# Patient Record
Sex: Female | Born: 1955 | Race: White | Hispanic: No | Marital: Married | Smoking: Never smoker
Health system: Southern US, Community
[De-identification: ages and names within clinical notes are randomized; demographics above are authoritative.]

## PROBLEM LIST (undated history)

## (undated) DIAGNOSIS — M8580 Other specified disorders of bone density and structure, unspecified site: Secondary | ICD-10-CM

## (undated) DIAGNOSIS — I7300 Raynaud's syndrome without gangrene: Secondary | ICD-10-CM

## (undated) DIAGNOSIS — B029 Zoster without complications: Secondary | ICD-10-CM

## (undated) DIAGNOSIS — Q8909 Congenital malformations of spleen: Secondary | ICD-10-CM

## (undated) DIAGNOSIS — R591 Generalized enlarged lymph nodes: Secondary | ICD-10-CM

## (undated) DIAGNOSIS — I639 Cerebral infarction, unspecified: Secondary | ICD-10-CM

## (undated) DIAGNOSIS — D649 Anemia, unspecified: Secondary | ICD-10-CM

## (undated) DIAGNOSIS — R569 Unspecified convulsions: Secondary | ICD-10-CM

## (undated) DIAGNOSIS — M3500 Sicca syndrome, unspecified: Secondary | ICD-10-CM

## (undated) DIAGNOSIS — L570 Actinic keratosis: Secondary | ICD-10-CM

## (undated) DIAGNOSIS — E785 Hyperlipidemia, unspecified: Secondary | ICD-10-CM

## (undated) DIAGNOSIS — T8859XA Other complications of anesthesia, initial encounter: Secondary | ICD-10-CM

## (undated) DIAGNOSIS — M419 Scoliosis, unspecified: Secondary | ICD-10-CM

## (undated) DIAGNOSIS — R599 Enlarged lymph nodes, unspecified: Secondary | ICD-10-CM

## (undated) DIAGNOSIS — R531 Weakness: Secondary | ICD-10-CM

## (undated) DIAGNOSIS — E039 Hypothyroidism, unspecified: Secondary | ICD-10-CM

## (undated) DIAGNOSIS — T7840XA Allergy, unspecified, initial encounter: Secondary | ICD-10-CM

## (undated) HISTORY — PX: FRACTURE SURGERY: SHX138

## (undated) HISTORY — PX: TONSILLECTOMY: SUR1361

## (undated) HISTORY — PX: EYE SURGERY: SHX253

## (undated) HISTORY — PX: FOOT FRACTURE SURGERY: SHX645

## (undated) HISTORY — DX: Cerebral infarction, unspecified: I63.9

## (undated) HISTORY — PX: BREAST CYST ASPIRATION: SHX578

## (undated) HISTORY — DX: Actinic keratosis: L57.0

## (undated) HISTORY — PX: FOOT SURGERY: SHX648

---

## 1988-12-16 HISTORY — PX: BREAST CYST ASPIRATION: SHX578

## 1990-04-17 HISTORY — PX: OTHER SURGICAL HISTORY: SHX169

## 2005-06-25 ENCOUNTER — Other Ambulatory Visit: Payer: Self-pay

## 2005-06-25 ENCOUNTER — Emergency Department: Payer: Self-pay | Admitting: General Practice

## 2005-06-26 ENCOUNTER — Ambulatory Visit: Payer: Self-pay | Admitting: General Practice

## 2005-06-30 ENCOUNTER — Ambulatory Visit: Payer: Self-pay | Admitting: Internal Medicine

## 2005-08-11 ENCOUNTER — Ambulatory Visit: Payer: Self-pay | Admitting: Specialist

## 2005-09-05 ENCOUNTER — Ambulatory Visit: Payer: Self-pay | Admitting: Obstetrics and Gynecology

## 2006-09-19 ENCOUNTER — Ambulatory Visit: Payer: Self-pay | Admitting: Obstetrics and Gynecology

## 2006-10-02 ENCOUNTER — Ambulatory Visit: Payer: Self-pay | Admitting: Gastroenterology

## 2007-09-25 ENCOUNTER — Ambulatory Visit: Payer: Self-pay | Admitting: Obstetrics and Gynecology

## 2008-10-01 ENCOUNTER — Ambulatory Visit: Payer: Self-pay | Admitting: Obstetrics and Gynecology

## 2008-10-12 ENCOUNTER — Ambulatory Visit: Payer: Self-pay | Admitting: Obstetrics and Gynecology

## 2009-11-01 ENCOUNTER — Ambulatory Visit: Payer: Self-pay | Admitting: Obstetrics and Gynecology

## 2011-01-10 ENCOUNTER — Ambulatory Visit: Payer: Self-pay | Admitting: Obstetrics and Gynecology

## 2012-01-17 ENCOUNTER — Ambulatory Visit: Payer: Self-pay | Admitting: Obstetrics and Gynecology

## 2013-01-20 ENCOUNTER — Ambulatory Visit: Payer: Self-pay | Admitting: Obstetrics and Gynecology

## 2013-07-10 DIAGNOSIS — M722 Plantar fascial fibromatosis: Secondary | ICD-10-CM | POA: Insufficient documentation

## 2013-07-10 DIAGNOSIS — D649 Anemia, unspecified: Secondary | ICD-10-CM | POA: Insufficient documentation

## 2013-07-10 DIAGNOSIS — D510 Vitamin B12 deficiency anemia due to intrinsic factor deficiency: Secondary | ICD-10-CM | POA: Insufficient documentation

## 2014-02-03 ENCOUNTER — Ambulatory Visit: Payer: Self-pay | Admitting: Obstetrics and Gynecology

## 2014-11-27 ENCOUNTER — Encounter: Payer: Self-pay | Admitting: *Deleted

## 2014-11-30 ENCOUNTER — Encounter: Admission: RE | Payer: Self-pay | Source: Ambulatory Visit

## 2014-11-30 ENCOUNTER — Ambulatory Visit
Admission: RE | Admit: 2014-11-30 | Payer: BC Managed Care – PPO | Source: Ambulatory Visit | Admitting: Gastroenterology

## 2014-11-30 HISTORY — DX: Raynaud's syndrome without gangrene: I73.00

## 2014-11-30 HISTORY — DX: Enlarged lymph nodes, unspecified: R59.9

## 2014-11-30 HISTORY — DX: Unspecified convulsions: R56.9

## 2014-11-30 HISTORY — DX: Scoliosis, unspecified: M41.9

## 2014-11-30 HISTORY — DX: Hypothyroidism, unspecified: E03.9

## 2014-11-30 HISTORY — DX: Generalized enlarged lymph nodes: R59.1

## 2014-11-30 HISTORY — DX: Zoster without complications: B02.9

## 2014-11-30 HISTORY — DX: Congenital malformations of spleen: Q89.09

## 2014-11-30 HISTORY — DX: Sjogren syndrome, unspecified: M35.00

## 2014-11-30 HISTORY — DX: Anemia, unspecified: D64.9

## 2014-11-30 HISTORY — DX: Other specified disorders of bone density and structure, unspecified site: M85.80

## 2014-11-30 SURGERY — ESOPHAGOGASTRODUODENOSCOPY (EGD) WITH PROPOFOL
Anesthesia: General

## 2015-01-26 ENCOUNTER — Other Ambulatory Visit: Payer: Self-pay | Admitting: Obstetrics and Gynecology

## 2015-01-26 DIAGNOSIS — Z1231 Encounter for screening mammogram for malignant neoplasm of breast: Secondary | ICD-10-CM

## 2015-02-08 ENCOUNTER — Ambulatory Visit: Payer: BC Managed Care – PPO

## 2015-02-10 ENCOUNTER — Ambulatory Visit: Payer: BC Managed Care – PPO

## 2015-02-11 ENCOUNTER — Ambulatory Visit
Admission: RE | Admit: 2015-02-11 | Discharge: 2015-02-11 | Disposition: A | Discharge status: Home / Self Care | Payer: BC Managed Care – PPO | Source: Ambulatory Visit | Attending: Obstetrics and Gynecology | Admitting: Obstetrics and Gynecology

## 2015-02-11 DIAGNOSIS — Z1231 Encounter for screening mammogram for malignant neoplasm of breast: Secondary | ICD-10-CM | POA: Diagnosis present

## 2015-05-29 ENCOUNTER — Inpatient Hospital Stay (HOSPITAL_COMMUNITY)
Admission: EM | Admit: 2015-05-29 | Discharge: 2015-06-16 | DRG: 870 | Disposition: A | Discharge status: Rehabilitation Facility | Payer: BC Managed Care – PPO | Attending: Internal Medicine | Admitting: Internal Medicine

## 2015-05-29 DIAGNOSIS — B954 Other streptococcus as the cause of diseases classified elsewhere: Secondary | ICD-10-CM | POA: Diagnosis present

## 2015-05-29 DIAGNOSIS — A400 Sepsis due to streptococcus, group A: Principal | ICD-10-CM | POA: Diagnosis present

## 2015-05-29 DIAGNOSIS — G8191 Hemiplegia, unspecified affecting right dominant side: Secondary | ICD-10-CM | POA: Diagnosis present

## 2015-05-29 DIAGNOSIS — J9690 Respiratory failure, unspecified, unspecified whether with hypoxia or hypercapnia: Secondary | ICD-10-CM

## 2015-05-29 DIAGNOSIS — J9600 Acute respiratory failure, unspecified whether with hypoxia or hypercapnia: Secondary | ICD-10-CM

## 2015-05-29 DIAGNOSIS — Z452 Encounter for adjustment and management of vascular access device: Secondary | ICD-10-CM | POA: Diagnosis present

## 2015-05-29 DIAGNOSIS — I4892 Unspecified atrial flutter: Secondary | ICD-10-CM | POA: Diagnosis present

## 2015-05-29 DIAGNOSIS — I471 Supraventricular tachycardia: Secondary | ICD-10-CM | POA: Diagnosis present

## 2015-05-29 DIAGNOSIS — M069 Rheumatoid arthritis, unspecified: Secondary | ICD-10-CM | POA: Diagnosis present

## 2015-05-29 DIAGNOSIS — R4182 Altered mental status, unspecified: Secondary | ICD-10-CM

## 2015-05-29 DIAGNOSIS — M419 Scoliosis, unspecified: Secondary | ICD-10-CM | POA: Diagnosis present

## 2015-05-29 DIAGNOSIS — E1165 Type 2 diabetes mellitus with hyperglycemia: Secondary | ICD-10-CM | POA: Diagnosis present

## 2015-05-29 DIAGNOSIS — D473 Essential (hemorrhagic) thrombocythemia: Secondary | ICD-10-CM | POA: Diagnosis present

## 2015-05-29 DIAGNOSIS — I7300 Raynaud's syndrome without gangrene: Secondary | ICD-10-CM | POA: Diagnosis present

## 2015-05-29 DIAGNOSIS — I63032 Cerebral infarction due to thrombosis of left carotid artery: Secondary | ICD-10-CM | POA: Diagnosis present

## 2015-05-29 DIAGNOSIS — R4189 Other symptoms and signs involving cognitive functions and awareness: Secondary | ICD-10-CM

## 2015-05-29 DIAGNOSIS — E8770 Fluid overload, unspecified: Secondary | ICD-10-CM | POA: Diagnosis present

## 2015-05-29 DIAGNOSIS — R1313 Dysphagia, pharyngeal phase: Secondary | ICD-10-CM | POA: Diagnosis present

## 2015-05-29 DIAGNOSIS — Z9911 Dependence on respirator [ventilator] status: Secondary | ICD-10-CM | POA: Diagnosis present

## 2015-05-29 DIAGNOSIS — G9340 Encephalopathy, unspecified: Secondary | ICD-10-CM | POA: Diagnosis not present

## 2015-05-29 DIAGNOSIS — K5900 Constipation, unspecified: Secondary | ICD-10-CM | POA: Diagnosis present

## 2015-05-29 DIAGNOSIS — E872 Acidosis, unspecified: Secondary | ICD-10-CM | POA: Diagnosis present

## 2015-05-29 DIAGNOSIS — E874 Mixed disorder of acid-base balance: Secondary | ICD-10-CM | POA: Diagnosis present

## 2015-05-29 DIAGNOSIS — I10 Essential (primary) hypertension: Secondary | ICD-10-CM | POA: Diagnosis present

## 2015-05-29 DIAGNOSIS — Z006 Encounter for examination for normal comparison and control in clinical research program: Secondary | ICD-10-CM | POA: Diagnosis present

## 2015-05-29 DIAGNOSIS — E0781 Sick-euthyroid syndrome: Secondary | ICD-10-CM | POA: Diagnosis present

## 2015-05-29 DIAGNOSIS — Z4659 Encounter for fitting and adjustment of other gastrointestinal appliance and device: Secondary | ICD-10-CM | POA: Diagnosis present

## 2015-05-29 DIAGNOSIS — J154 Pneumonia due to other streptococci: Secondary | ICD-10-CM | POA: Diagnosis present

## 2015-05-29 DIAGNOSIS — E11649 Type 2 diabetes mellitus with hypoglycemia without coma: Secondary | ICD-10-CM | POA: Diagnosis not present

## 2015-05-29 DIAGNOSIS — A419 Sepsis, unspecified organism: Secondary | ICD-10-CM | POA: Diagnosis present

## 2015-05-29 DIAGNOSIS — J9601 Acute respiratory failure with hypoxia: Secondary | ICD-10-CM | POA: Diagnosis present

## 2015-05-29 DIAGNOSIS — E871 Hypo-osmolality and hyponatremia: Secondary | ICD-10-CM | POA: Diagnosis present

## 2015-05-29 DIAGNOSIS — R29810 Facial weakness: Secondary | ICD-10-CM | POA: Diagnosis present

## 2015-05-29 DIAGNOSIS — T380X5A Adverse effect of glucocorticoids and synthetic analogues, initial encounter: Secondary | ICD-10-CM | POA: Diagnosis not present

## 2015-05-29 DIAGNOSIS — R6521 Severe sepsis with septic shock: Secondary | ICD-10-CM | POA: Diagnosis present

## 2015-05-29 DIAGNOSIS — M3500 Sicca syndrome, unspecified: Secondary | ICD-10-CM | POA: Diagnosis present

## 2015-05-29 DIAGNOSIS — R319 Hematuria, unspecified: Secondary | ICD-10-CM | POA: Diagnosis present

## 2015-05-29 DIAGNOSIS — K8020 Calculus of gallbladder without cholecystitis without obstruction: Secondary | ICD-10-CM | POA: Diagnosis present

## 2015-05-29 DIAGNOSIS — Z6821 Body mass index (BMI) 21.0-21.9, adult: Secondary | ICD-10-CM

## 2015-05-29 DIAGNOSIS — Z978 Presence of other specified devices: Secondary | ICD-10-CM

## 2015-05-29 DIAGNOSIS — Z79899 Other long term (current) drug therapy: Secondary | ICD-10-CM

## 2015-05-29 DIAGNOSIS — R0902 Hypoxemia: Secondary | ICD-10-CM

## 2015-05-29 DIAGNOSIS — E785 Hyperlipidemia, unspecified: Secondary | ICD-10-CM | POA: Diagnosis present

## 2015-05-29 DIAGNOSIS — E039 Hypothyroidism, unspecified: Secondary | ICD-10-CM | POA: Diagnosis present

## 2015-05-29 DIAGNOSIS — I6529 Occlusion and stenosis of unspecified carotid artery: Secondary | ICD-10-CM | POA: Diagnosis present

## 2015-05-29 DIAGNOSIS — R1310 Dysphagia, unspecified: Secondary | ICD-10-CM

## 2015-05-29 DIAGNOSIS — IMO0002 Reserved for concepts with insufficient information to code with codable children: Secondary | ICD-10-CM

## 2015-05-29 DIAGNOSIS — R7989 Other specified abnormal findings of blood chemistry: Secondary | ICD-10-CM

## 2015-05-29 DIAGNOSIS — R238 Other skin changes: Secondary | ICD-10-CM | POA: Diagnosis present

## 2015-05-29 DIAGNOSIS — Z880 Allergy status to penicillin: Secondary | ICD-10-CM

## 2015-05-29 DIAGNOSIS — J1008 Influenza due to other identified influenza virus with other specified pneumonia: Secondary | ICD-10-CM | POA: Diagnosis present

## 2015-05-29 DIAGNOSIS — E876 Hypokalemia: Secondary | ICD-10-CM | POA: Diagnosis not present

## 2015-05-29 DIAGNOSIS — I4891 Unspecified atrial fibrillation: Secondary | ICD-10-CM | POA: Diagnosis present

## 2015-05-29 DIAGNOSIS — R682 Dry mouth, unspecified: Secondary | ICD-10-CM | POA: Diagnosis not present

## 2015-05-29 DIAGNOSIS — K222 Esophageal obstruction: Secondary | ICD-10-CM

## 2015-05-29 DIAGNOSIS — J189 Pneumonia, unspecified organism: Secondary | ICD-10-CM | POA: Diagnosis present

## 2015-05-29 DIAGNOSIS — N179 Acute kidney failure, unspecified: Secondary | ICD-10-CM | POA: Diagnosis present

## 2015-05-29 DIAGNOSIS — E46 Unspecified protein-calorie malnutrition: Secondary | ICD-10-CM | POA: Diagnosis present

## 2015-05-29 DIAGNOSIS — D649 Anemia, unspecified: Secondary | ICD-10-CM | POA: Diagnosis present

## 2015-05-29 DIAGNOSIS — Z881 Allergy status to other antibiotic agents status: Secondary | ICD-10-CM

## 2015-05-29 DIAGNOSIS — R945 Abnormal results of liver function studies: Secondary | ICD-10-CM

## 2015-05-29 DIAGNOSIS — I639 Cerebral infarction, unspecified: Secondary | ICD-10-CM

## 2015-05-29 DIAGNOSIS — A491 Streptococcal infection, unspecified site: Secondary | ICD-10-CM | POA: Diagnosis present

## 2015-05-29 DIAGNOSIS — R531 Weakness: Secondary | ICD-10-CM

## 2015-05-29 DIAGNOSIS — B379 Candidiasis, unspecified: Secondary | ICD-10-CM | POA: Diagnosis present

## 2015-05-29 NOTE — Consult Note (Signed)
Requesting Physician: Dr.  Royston Sinner    Reason for consultation:  Generalized weakness, influenza,   Evaluate for acute stroke  HPI:                                                                                                                                         [name] [name] is an 60 y.o. female patient who  Has been sick with influenza symptoms for the past 4-5  Days with worsening generalized weakness ,. Poor by mouth intake,  Per her husband.  Today around 8:30 PM when she got up to go to bathroom she had give severe gait instability and couldn't stand up due to generalized weakness.  She'll brought into the ER by EMS for further evaluation.  Was diagnosed with flu yesterday and started on Tamiflu.  History of multiple autoimmune conditions with  Sjgren's,  rheumatoid arthritis,  on plaquenil  and prednisone at home.   Patient with severe restrictive distress when she presented to the ER with oxygen saturation in 70s while on nonrebreathing mask.  With 100% BiPAP, her O2 sats improved to 91%,  Continues to be in severe distress.    Date last known well:   Generalized weakness for 4-5 days,   Last normal prior to that Time last known well:   unknown tPA Given: No:  Clinically not suggestive of acute stroke.  Last normal greater than 4-5 days ago.   Stroke Risk Factors - Sjgren's,  rheumatoid arthritis  Past Medical History: Past Medical History  Diagnosis Date  . Hypothyroidism   . Seizures ([place])   . Adenopathy   . Scoliosis   . Spleen anomaly   . Anemia   . Sjogren's disease ([place])   . Raynaud disease   . Shingles   . Osteopenia     Past Surgical History  Procedure Laterality Date  . Tonsillectomy    . Breast cyst aspiration Right 1990's    neg    Family History: Family History  Problem Relation Age of Onset  . Breast cancer Maternal Grandfather 8    Social History:   reports that she has never smoked. She has never used smokeless tobacco. She reports that she does  not drink alcohol or use illicit drugs.  Allergies:  Allergies  Allergen Reactions  . Erythromycin Ethylsuccinate   . Keflex [Cephalexin]   . Penicillins   . Sulfa Antibiotics      Medications:  No current facility-administered medications for this encounter.  Current outpatient prescriptions:  .  cetirizine (ZYRTEC) 10 MG tablet, Take 10 mg by mouth daily., Disp: , Rfl:  .  conjugated estrogens (PREMARIN) vaginal cream, Place 1 Applicatorful vaginally 2 (two) times a week., Disp: , Rfl:  .  fluticasone (FLONASE) 50 MCG/ACT nasal spray, Place 2 sprays into both nostrils daily., Disp: , Rfl:  .  hydroxychloroquine (PLAQUENIL) 200 MG tablet, Take by mouth daily., Disp: , Rfl:  .  levothyroxine (SYNTHROID, LEVOTHROID) 112 MCG tablet, Take 112 mcg by mouth daily before breakfast., Disp: , Rfl:  .  Multiple Vitamin (MULTIVITAMIN) tablet, Take 1 tablet by mouth daily., Disp: , Rfl:  .  predniSONE (DELTASONE) 10 MG tablet, Take 10 mg by mouth daily with breakfast., Disp: , Rfl:  .  tri-vitamin w/ fluoride (TRI-VI-SOL) 0.25 MG/ML solution, Take 0.25 mg by mouth daily., Disp: , Rfl:  .  vitamin B-12 (CYANOCOBALAMIN) 1000 MCG tablet, Take 1,000 mcg by mouth daily., Disp: , Rfl:    ROS:                                                                                                                                       History obtained from unobtainable from patient due to mental status patient of 100% BiPAP, unable to provide history   Neurologic Examination:                                                                                                      There were no vitals taken for this visit.  Evaluation of higher integrative functions including: Level of alertness:  significant  Respiratory distress , on BiPAP, drowsy  Orientation to time, place and person -  Unable  to assess Speech:  Nonverbal,,  Wearing BiPAP,  Comprehension intact , follows commands  Test the following cranial nerves: 2-12 grossly appear intact Motor examination: spastic and right upper extremity,  With right upper extremity reduced range of motion and drift noted.  Sustained antigravity  Strength in left upper extremity and bilateral lower extremities with no drift,  Not cooperative for resistance testing Examination of sensation :  Unable to assess due to significant distress and poor cooperation Test coordination:  no abnormal involuntary movements or tremors noted.  Gait: unable to assess   Lab Results: Basic Metabolic Panel: No results for input(s): NA, K, CL, CO2, GLUCOSE, BUN, CREATININE, CALCIUM, MG, PHOS in the last 168 hours.  Liver Function Tests: No results  for input(s): AST, ALT, ALKPHOS, BILITOT, PROT, ALBUMIN in the last 168 hours. No results for input(s): LIPASE, AMYLASE in the last 168 hours. No results for input(s): AMMONIA in the last 168 hours.  CBC: No results for input(s): WBC, NEUTROABS, HGB, HCT, MCV, PLT in the last 168 hours.  Cardiac Enzymes: No results for input(s): CKTOTAL, CKMB, CKMBINDEX, TROPONINI in the last 168 hours.  Lipid Panel: No results for input(s): CHOL, TRIG, HDL, CHOLHDL, VLDL, LDLCALC in the last 168 hours.  CBG: No results for input(s): GLUCAP in the last 168 hours.  Microbiology: No results found for this or any previous visit.   Imaging: No results found.  Assessment and plan:   [name] [name] is an 60 y.o. female patient who presented to emergency room via EMS in severe  respiratory distress,  With SPO2 in 70s,  Placed on 100% BiPAP with improved SPO2 91%.  Had been having flu symptoms for the past 4-5 days with worsening generalized weakness , and poor by mouth intake with dehydration,  Noted to have  dizziness and gait instability when she tried to go to bathroom earlier this evening at 8:30 PM.  Neurological examination  is significant for spasticity in the right upper extremity with reduced range of motion and drift in the right arm only with no definite weakness in the left upper extremity or in the lower extremities. Appears chronic based on the spasticity.  Patient's husband is not aware of any weakness.  She is diagnosed with influenza yesterday and started on Tamiflu. She does have erythematous  reticular rash in all extremities.  Based on clinical examination, the suspicion for an acute stroke is low.  Cannot exclude CNS infection contributing to the mental status changes, especially given the positive influenza diagnosis.  She is chronically immunosuppressed due to plaquenil  and prednisone treatment for autoimmune conditions Sjgren's disease and rheumatoid arthritis,  Which likely increased her susceptibility for infections.   After her respiratory status is stabilized,  recommend CT of the head now to rule out acute intracranial pathology,  following which recommend lumbar puncture for CSF studies to rule out CNS infection.  Discussed with ER physician. She is covered with empiric antibiotics,  IV acyclovir and ordered one dose of iv Decadron 10 mg.  Recommend brain MRI without without contrast when stable for the study.  Neurology service will continue to follow up. Please call for any further questions.

## 2015-05-29 NOTE — ED Provider Notes (Signed)
By signing my name below, I, [name] [name], attest that this documentation has been prepared under the direction and in the presence of [name], DO.  Electronically Signed: Forrestine [name], ED Scribe. 05/29/2015. 12:21 AM.   TIME SEEN: 11:55 PM   CHIEF COMPLAINT: Code Stroke   LEVEL 5 CAVEAT DUE TO CONDITION   HPI:  HPI Comments: [name] [name] brought in by EMS is a 60 y.o. female with hypothyroidism, Sjogren's on plaquenil who presents to the Emergency Department here for a possible code stroke this evening. Husband states at approximately 8:30 PM-9:00 PM this evening, pt became weak which worsened over a short period of time. He states pt was in the bathroom when she called for [name] and he caught her prior to her falling to the ground in the door way. EMS reports a R sided facial droop, right upper and lower extremity weakness. She is very somnolent. Last known normal at 8:00 PM this evening per husband.  Pt was recently diagnosed with the Flu 2 days ago and was started on TamiFlu. Patient reports she is short of breath. Denies headache, neck pain or neck stiffness. No chest pain. No abdominal pain. Husband denies any vomiting or diarrhea. Reports she has had decreased appetite recently. No recent known head injury. Not on anticoagulation. No previous history of stroke or head bleed.  PCP: No primary care provider on file.    ROS: See HPI- LEVEL 5 CAVEAT DUE TO CONDITION Constitutional:  fever  Eyes: no drainage  ENT: no runny nose   Cardiovascular:  no chest pain  Resp: no SOB  GI: no vomiting GU: no dysuria Integumentary: no rash  Allergy: no hives  Musculoskeletal: no leg swelling  Neurological: no slurred speech ROS otherwise negative  PAST MEDICAL HISTORY/PAST SURGICAL HISTORY:  Past Medical History  Diagnosis Date  . Hypothyroidism   . Seizures ([place])   . Adenopathy   . Scoliosis   . Spleen anomaly   . Anemia   . Sjogren's disease ([place])   . Raynaud disease   .  Shingles   . Osteopenia     MEDICATIONS:  Prior to Admission medications   Medication Sig Start Date End Date Taking? Authorizing Provider  cetirizine (ZYRTEC) 10 MG tablet Take 10 mg by mouth daily.    Historical Provider, MD  conjugated estrogens (PREMARIN) vaginal cream Place 1 Applicatorful vaginally 2 (two) times a week.    Historical Provider, MD  fluticasone (FLONASE) 50 MCG/ACT nasal spray Place 2 sprays into both nostrils daily.    Historical Provider, MD  hydroxychloroquine (PLAQUENIL) 200 MG tablet Take by mouth daily.    Historical Provider, MD  levothyroxine (SYNTHROID, LEVOTHROID) 112 MCG tablet Take 112 mcg by mouth daily before breakfast.    Historical Provider, MD  Multiple Vitamin (MULTIVITAMIN) tablet Take 1 tablet by mouth daily.    Historical Provider, MD  predniSONE (DELTASONE) 10 MG tablet Take 10 mg by mouth daily with breakfast.    Historical Provider, MD  tri-vitamin w/ fluoride (TRI-VI-SOL) 0.25 MG/ML solution Take 0.25 mg by mouth daily.    Historical Provider, MD  vitamin B-12 (CYANOCOBALAMIN) 1000 MCG tablet Take 1,000 mcg by mouth daily.    Historical Provider, MD    ALLERGIES:  Allergies  Allergen Reactions  . Erythromycin Ethylsuccinate   . Keflex [Cephalexin]   . Penicillins   . Sulfa Antibiotics     SOCIAL HISTORY:  Social History  Substance Use Topics  . Smoking status: Never Smoker   .  Smokeless tobacco: Never Used  . Alcohol Use: No    FAMILY HISTORY: Family History  Problem Relation Age of Onset  . Breast cancer Maternal Grandfather 60    EXAM: Pulse 131  Temp(Src) 99.3 F (37.4 C) (Axillary)  Resp 29  SpO2 92% CONSTITUTIONAL: Alert and oriented and responds appropriately to questions. Febrile and toxic appearing. Drowsy but arousable  HEAD: Normocephalic EYES: Conjunctivae clear, PERRL ENT: normal nose; no rhinorrhea; moist mucous membranes; pharynx without lesions noted, blue lips NECK: Supple, no meningismus, no LAD; no  nuchal rigidity  CARD: Regular and tachycardic; S1 and S2 appreciated; no murmurs, no clicks, no rubs, no gallops RESP: Pt is tachypneic and hypoxic; Normal chest excursion without splinting; breath sounds clear and equal bilaterally; no wheezes, no rhonchi, no rales, mild to moderate respiratory distress, speaking short sentences ABD/GI: Normal bowel sounds; non-distended; soft, non-tender, no rebound, no guarding, no peritoneal signs BACK:  The back appears normal and is non-tender to palpation, there is no CVA tenderness EXT: Normal ROM in all joints; non-tender to palpation; no edema; normal capillary refill; patient has cyanotic and cool fingertips, no calf tenderness or swelling    SKIN: Normal color for age and race; warm. Diffuse petechial rash noted, no purpura. No blisters or desquamation. No rash involving her palms, soles or mucous membranes. NEURO: R sided facial droop. R upper and lower extremity weakness with pronator drift compared to L. No aphasia or dysarthria. Cranial nerves 2-12 intact. Reports normal sensation diffusely  PSYCH: The patient's mood and manner are appropriate. Grooming and personal hygiene are appropriate.  MEDICAL DECISION MAKING: Patient here initially called out as a code stroke by [facility] for right-sided weakness, right-sided facial droop that started tonight. Last normal 8 PM. Upon patient's arrival she is found to be tachycardic and hypoxic and appears slightly cyanotic. Taken immediately to a treatment room and placed on BiPAP. Was found to be febrile with rectal temperature 102.1. Broad-spectrum antibiotics ordered for concern for code sepsis. Her right upper extremity and lower extremity weakness appeared to be improving as well as her right facial droop. Given she is unstable and meets sepsis criteria, code stroke cancel that she would not be a TPA candidate. Dr. Silverio Decamp at bedside with neurology. Given her neurologic deficits, altered metal status, fever,  petechial rash, concern for possible meningitis, encephalitis. When she is stable and she will need lumbar puncture. She is receiving IV fluids and broad-spectrum antibiotics. Anticipate admission.  ED PROGRESS: Patient's chest x-ray shows large right sided infiltrate. She has a lactate of 8. Has lactic acidosis.  Discussed with critical care.  Dr. Lincoln Maxin at bedside. We agree the patient needs intubation given she is somnolent, hypoxic still on BiPAP. Patient and husband comfortable with this plan. She is a full code.   Intubation performed without difficulty. Sedating with propofol. Critical care physician to perform lumbar puncture and place central line.    Head CT shows no acute intracranial abnormality.  Care transferred to critical care.     EKG Interpretation  Date/Time:  Sunday May 30 2015 00:12:44 EST Ventricular Rate:  129 PR Interval:  130 QRS Duration: 97 QT Interval:  286 QTC Calculation: 419 R Axis:   74 Text Interpretation:  Sinus tachycardia Consider right atrial enlargement Artifact in lead(s) I II III aVR aVL aVF V1 Confirmed by Leandria Thier,  DO, Stefanee Mckell [id number]) on 05/30/2015 12:22:37 AM        CRITICAL CARE Performed by: Delice Bison Tanji Storrs, DO  Total  critical care time: 60 minutes Critical care time was exclusive of separately billable procedures and treating other patients. Critical care was necessary to treat or prevent imminent or life-threatening deterioration. Critical care was time spent personally by me on the following activities: development of treatment plan with patient and/or surrogate as well as nursing, discussions with consultants, evaluation of patient's response to treatment, examination of patient, obtaining history from patient or surrogate, ordering and performing treatments and interventions, ordering and review of laboratory studies, ordering and review of radiographic studies, pulse oximetry and re-evaluation of patient's condition.     INTUBATION Performed by: Nyra Jabs  Required items: required blood products, implants, devices, and special equipment available Patient identity confirmed: provided demographic data and hospital-assigned identification number Time out: Immediately prior to procedure a "time out" was called to verify the correct patient, procedure, equipment, support staff and site/side marked as required.  Indications: Hypoxia, respiratory failure, pneumonia   Intubation method: Glidescope Laryngoscopy   Preoxygenation: BVM and bipap  Sedatives: 30 mg IV Etomidate Paralytic: 100 mg IV Succinylcholine  Tube Size: 7.5 cuffed  Post-procedure assessment: chest rise and ETCO2 monitor Breath sounds: equal and absent over the epigastrium Tube secured with: ETT holder Chest x-ray interpreted by radiologist and me.  Chest x-ray findings: endotracheal tube in appropriate position  Patient tolerated the procedure well with no immediate complications.      I personally performed the services described in this documentation, which was scribed in my presence. The recorded information has been reviewed and is accurate.   [place], DO 05/30/15 [phone]

## 2015-05-30 ENCOUNTER — Emergency Department (HOSPITAL_COMMUNITY): Payer: BC Managed Care – PPO

## 2015-05-30 ENCOUNTER — Encounter (HOSPITAL_COMMUNITY): Payer: Self-pay

## 2015-05-30 ENCOUNTER — Inpatient Hospital Stay (HOSPITAL_COMMUNITY): Payer: BC Managed Care – PPO

## 2015-05-30 DIAGNOSIS — J189 Pneumonia, unspecified organism: Secondary | ICD-10-CM | POA: Diagnosis not present

## 2015-05-30 DIAGNOSIS — R682 Dry mouth, unspecified: Secondary | ICD-10-CM | POA: Diagnosis not present

## 2015-05-30 DIAGNOSIS — E876 Hypokalemia: Secondary | ICD-10-CM | POA: Diagnosis not present

## 2015-05-30 DIAGNOSIS — N179 Acute kidney failure, unspecified: Secondary | ICD-10-CM | POA: Diagnosis present

## 2015-05-30 DIAGNOSIS — Z87898 Personal history of other specified conditions: Secondary | ICD-10-CM | POA: Diagnosis not present

## 2015-05-30 DIAGNOSIS — M419 Scoliosis, unspecified: Secondary | ICD-10-CM | POA: Diagnosis present

## 2015-05-30 DIAGNOSIS — G9340 Encephalopathy, unspecified: Secondary | ICD-10-CM | POA: Diagnosis present

## 2015-05-30 DIAGNOSIS — E872 Acidosis, unspecified: Secondary | ICD-10-CM | POA: Diagnosis present

## 2015-05-30 DIAGNOSIS — Z9911 Dependence on respirator [ventilator] status: Secondary | ICD-10-CM | POA: Diagnosis not present

## 2015-05-30 DIAGNOSIS — R401 Stupor: Secondary | ICD-10-CM | POA: Diagnosis not present

## 2015-05-30 DIAGNOSIS — E0781 Sick-euthyroid syndrome: Secondary | ICD-10-CM | POA: Diagnosis present

## 2015-05-30 DIAGNOSIS — I4892 Unspecified atrial flutter: Secondary | ICD-10-CM | POA: Diagnosis present

## 2015-05-30 DIAGNOSIS — R6521 Severe sepsis with septic shock: Secondary | ICD-10-CM | POA: Diagnosis present

## 2015-05-30 DIAGNOSIS — Z6821 Body mass index (BMI) 21.0-21.9, adult: Secondary | ICD-10-CM | POA: Diagnosis not present

## 2015-05-30 DIAGNOSIS — D649 Anemia, unspecified: Secondary | ICD-10-CM | POA: Diagnosis present

## 2015-05-30 DIAGNOSIS — B379 Candidiasis, unspecified: Secondary | ICD-10-CM | POA: Diagnosis present

## 2015-05-30 DIAGNOSIS — E785 Hyperlipidemia, unspecified: Secondary | ICD-10-CM | POA: Diagnosis present

## 2015-05-30 DIAGNOSIS — I69351 Hemiplegia and hemiparesis following cerebral infarction affecting right dominant side: Secondary | ICD-10-CM | POA: Diagnosis not present

## 2015-05-30 DIAGNOSIS — I63412 Cerebral infarction due to embolism of left middle cerebral artery: Secondary | ICD-10-CM | POA: Diagnosis not present

## 2015-05-30 DIAGNOSIS — N390 Urinary tract infection, site not specified: Secondary | ICD-10-CM | POA: Diagnosis not present

## 2015-05-30 DIAGNOSIS — T380X5A Adverse effect of glucocorticoids and synthetic analogues, initial encounter: Secondary | ICD-10-CM | POA: Diagnosis not present

## 2015-05-30 DIAGNOSIS — I69359 Hemiplegia and hemiparesis following cerebral infarction affecting unspecified side: Secondary | ICD-10-CM | POA: Diagnosis not present

## 2015-05-30 DIAGNOSIS — Z880 Allergy status to penicillin: Secondary | ICD-10-CM | POA: Diagnosis not present

## 2015-05-30 DIAGNOSIS — I471 Supraventricular tachycardia: Secondary | ICD-10-CM | POA: Diagnosis present

## 2015-05-30 DIAGNOSIS — E8770 Fluid overload, unspecified: Secondary | ICD-10-CM | POA: Diagnosis present

## 2015-05-30 DIAGNOSIS — I69991 Dysphagia following unspecified cerebrovascular disease: Secondary | ICD-10-CM | POA: Diagnosis not present

## 2015-05-30 DIAGNOSIS — E871 Hypo-osmolality and hyponatremia: Secondary | ICD-10-CM | POA: Diagnosis present

## 2015-05-30 DIAGNOSIS — R0902 Hypoxemia: Secondary | ICD-10-CM | POA: Insufficient documentation

## 2015-05-30 DIAGNOSIS — I63032 Cerebral infarction due to thrombosis of left carotid artery: Secondary | ICD-10-CM | POA: Diagnosis present

## 2015-05-30 DIAGNOSIS — M3500 Sicca syndrome, unspecified: Secondary | ICD-10-CM | POA: Diagnosis present

## 2015-05-30 DIAGNOSIS — E039 Hypothyroidism, unspecified: Secondary | ICD-10-CM | POA: Diagnosis present

## 2015-05-30 DIAGNOSIS — J1008 Influenza due to other identified influenza virus with other specified pneumonia: Secondary | ICD-10-CM | POA: Diagnosis present

## 2015-05-30 DIAGNOSIS — K5900 Constipation, unspecified: Secondary | ICD-10-CM | POA: Diagnosis present

## 2015-05-30 DIAGNOSIS — R4182 Altered mental status, unspecified: Secondary | ICD-10-CM | POA: Diagnosis present

## 2015-05-30 DIAGNOSIS — I639 Cerebral infarction, unspecified: Secondary | ICD-10-CM | POA: Diagnosis not present

## 2015-05-30 DIAGNOSIS — Z881 Allergy status to other antibiotic agents status: Secondary | ICD-10-CM | POA: Diagnosis not present

## 2015-05-30 DIAGNOSIS — R1313 Dysphagia, pharyngeal phase: Secondary | ICD-10-CM | POA: Diagnosis present

## 2015-05-30 DIAGNOSIS — E1165 Type 2 diabetes mellitus with hyperglycemia: Secondary | ICD-10-CM | POA: Diagnosis present

## 2015-05-30 DIAGNOSIS — Z452 Encounter for adjustment and management of vascular access device: Secondary | ICD-10-CM | POA: Diagnosis present

## 2015-05-30 DIAGNOSIS — E11649 Type 2 diabetes mellitus with hypoglycemia without coma: Secondary | ICD-10-CM | POA: Diagnosis not present

## 2015-05-30 DIAGNOSIS — J154 Pneumonia due to other streptococci: Secondary | ICD-10-CM | POA: Diagnosis present

## 2015-05-30 DIAGNOSIS — R4020 Unspecified coma: Secondary | ICD-10-CM | POA: Diagnosis not present

## 2015-05-30 DIAGNOSIS — E162 Hypoglycemia, unspecified: Secondary | ICD-10-CM | POA: Diagnosis not present

## 2015-05-30 DIAGNOSIS — R7881 Bacteremia: Secondary | ICD-10-CM | POA: Diagnosis not present

## 2015-05-30 DIAGNOSIS — I4891 Unspecified atrial fibrillation: Secondary | ICD-10-CM | POA: Diagnosis present

## 2015-05-30 DIAGNOSIS — R238 Other skin changes: Secondary | ICD-10-CM | POA: Diagnosis present

## 2015-05-30 DIAGNOSIS — A400 Sepsis due to streptococcus, group A: Secondary | ICD-10-CM | POA: Diagnosis present

## 2015-05-30 DIAGNOSIS — R29810 Facial weakness: Secondary | ICD-10-CM | POA: Diagnosis present

## 2015-05-30 DIAGNOSIS — R1310 Dysphagia, unspecified: Secondary | ICD-10-CM | POA: Diagnosis not present

## 2015-05-30 DIAGNOSIS — Z79899 Other long term (current) drug therapy: Secondary | ICD-10-CM | POA: Diagnosis not present

## 2015-05-30 DIAGNOSIS — A419 Sepsis, unspecified organism: Secondary | ICD-10-CM | POA: Diagnosis present

## 2015-05-30 DIAGNOSIS — D473 Essential (hemorrhagic) thrombocythemia: Secondary | ICD-10-CM | POA: Diagnosis present

## 2015-05-30 DIAGNOSIS — K222 Esophageal obstruction: Secondary | ICD-10-CM | POA: Diagnosis present

## 2015-05-30 DIAGNOSIS — J9601 Acute respiratory failure with hypoxia: Secondary | ICD-10-CM | POA: Diagnosis present

## 2015-05-30 DIAGNOSIS — Z006 Encounter for examination for normal comparison and control in clinical research program: Secondary | ICD-10-CM | POA: Diagnosis not present

## 2015-05-30 DIAGNOSIS — I10 Essential (primary) hypertension: Secondary | ICD-10-CM | POA: Diagnosis present

## 2015-05-30 DIAGNOSIS — M069 Rheumatoid arthritis, unspecified: Secondary | ICD-10-CM | POA: Diagnosis present

## 2015-05-30 DIAGNOSIS — K8020 Calculus of gallbladder without cholecystitis without obstruction: Secondary | ICD-10-CM | POA: Diagnosis present

## 2015-05-30 DIAGNOSIS — J9600 Acute respiratory failure, unspecified whether with hypoxia or hypercapnia: Secondary | ICD-10-CM | POA: Diagnosis not present

## 2015-05-30 DIAGNOSIS — I7300 Raynaud's syndrome without gangrene: Secondary | ICD-10-CM | POA: Diagnosis present

## 2015-05-30 DIAGNOSIS — E874 Mixed disorder of acid-base balance: Secondary | ICD-10-CM | POA: Diagnosis present

## 2015-05-30 DIAGNOSIS — E46 Unspecified protein-calorie malnutrition: Secondary | ICD-10-CM | POA: Diagnosis present

## 2015-05-30 DIAGNOSIS — J101 Influenza due to other identified influenza virus with other respiratory manifestations: Secondary | ICD-10-CM | POA: Diagnosis not present

## 2015-05-30 DIAGNOSIS — R400 Somnolence: Secondary | ICD-10-CM | POA: Diagnosis not present

## 2015-05-30 DIAGNOSIS — G8191 Hemiplegia, unspecified affecting right dominant side: Secondary | ICD-10-CM | POA: Diagnosis present

## 2015-05-30 DIAGNOSIS — R319 Hematuria, unspecified: Secondary | ICD-10-CM | POA: Diagnosis not present

## 2015-05-30 DIAGNOSIS — I6522 Occlusion and stenosis of left carotid artery: Secondary | ICD-10-CM | POA: Diagnosis not present

## 2015-05-30 DIAGNOSIS — J1100 Influenza due to unidentified influenza virus with unspecified type of pneumonia: Secondary | ICD-10-CM | POA: Diagnosis not present

## 2015-05-30 DIAGNOSIS — R404 Transient alteration of awareness: Secondary | ICD-10-CM | POA: Diagnosis not present

## 2015-05-30 LAB — CBC WITH DIFFERENTIAL/PLATELET
BASOS PCT: 0 %
Basophils Absolute: 0 10*3/uL (ref 0.0–0.1)
EOS ABS: 0.1 10*3/uL (ref 0.0–0.7)
Eosinophils Relative: 1 %
HEMATOCRIT: 40.9 % (ref 36.0–46.0)
Hemoglobin: 13.6 g/dL (ref 12.0–15.0)
LYMPHS ABS: 0.6 10*3/uL — AB (ref 0.7–4.0)
Lymphocytes Relative: 4 %
MCH: 28.6 pg (ref 26.0–34.0)
MCHC: 33.3 g/dL (ref 30.0–36.0)
MCV: 85.9 fL (ref 78.0–100.0)
MONO ABS: 0.4 10*3/uL (ref 0.1–1.0)
Monocytes Relative: 3 %
NEUTROS ABS: 12.7 10*3/uL — AB (ref 1.7–7.7)
Neutrophils Relative %: 92 %
Platelets: 266 10*3/uL (ref 150–400)
RBC: 4.76 MIL/uL (ref 3.87–5.11)
RDW: 15.4 % (ref 11.5–15.5)
Smear Review: ADEQUATE
WBC: 13.8 10*3/uL — ABNORMAL HIGH (ref 4.0–10.5)

## 2015-05-30 LAB — I-STAT CHEM 8, ED
BUN: 71 mg/dL — ABNORMAL HIGH (ref 6–20)
CALCIUM ION: 0.93 mmol/L — AB (ref 1.13–1.30)
CREATININE: 1.1 mg/dL — AB (ref 0.44–1.00)
Chloride: 93 mmol/L — ABNORMAL LOW (ref 101–111)
GLUCOSE: 137 mg/dL — AB (ref 65–99)
HCT: 49 % — ABNORMAL HIGH (ref 36.0–46.0)
HEMOGLOBIN: 16.7 g/dL — AB (ref 12.0–15.0)
Potassium: 5.5 mmol/L — ABNORMAL HIGH (ref 3.5–5.1)
Sodium: 124 mmol/L — ABNORMAL LOW (ref 135–145)
TCO2: 20 mmol/L (ref 0–100)

## 2015-05-30 LAB — I-STAT ARTERIAL BLOOD GAS, ED
ACID-BASE DEFICIT: 6 mmol/L — AB (ref 0.0–2.0)
Acid-base deficit: 10 mmol/L — ABNORMAL HIGH (ref 0.0–2.0)
BICARBONATE: 17.4 meq/L — AB (ref 20.0–24.0)
Bicarbonate: 16.9 mEq/L — ABNORMAL LOW (ref 20.0–24.0)
O2 Saturation: 86 %
O2 Saturation: 92 %
PCO2 ART: 44.1 mmHg (ref 35.0–45.0)
PH ART: 7.383 (ref 7.350–7.450)
PO2 ART: 68 mmHg — AB (ref 80.0–100.0)
TCO2: 18 mmol/L (ref 0–100)
TCO2: 19 mmol/L (ref 0–100)
pCO2 arterial: 28.4 mmHg — ABNORMAL LOW (ref 35.0–45.0)
pH, Arterial: 7.213 — ABNORMAL LOW (ref 7.350–7.450)
pO2, Arterial: 64 mmHg — ABNORMAL LOW (ref 80.0–100.0)

## 2015-05-30 LAB — BLOOD GAS, ARTERIAL
ACID-BASE DEFICIT: 8.5 mmol/L — AB (ref 0.0–2.0)
Acid-base deficit: 10.4 mmol/L — ABNORMAL HIGH (ref 0.0–2.0)
BICARBONATE: 16 meq/L — AB (ref 20.0–24.0)
Bicarbonate: 17.5 mEq/L — ABNORMAL LOW (ref 20.0–24.0)
DRAWN BY: 213381
Drawn by: 252031
FIO2: 1
FIO2: 1
LHR: 28 {breaths}/min
O2 SAT: 91 %
O2 Saturation: 96.4 %
PATIENT TEMPERATURE: 98.6
PCO2 ART: 42 mmHg (ref 35.0–45.0)
PCO2 ART: 41.1 mmHg (ref 35.0–45.0)
PEEP: 12 cmH2O
PEEP: 12 cmH2O
PH ART: 7.243 — AB (ref 7.350–7.450)
Patient temperature: 98.6
RATE: 28 resp/min
TCO2: 18.8 mmol/L (ref 0–100)
TCO2: 17.2 mmol/L (ref 0–100)
VT: 350 mL
VT: 350 mL
pH, Arterial: 7.214 — ABNORMAL LOW (ref 7.350–7.450)
pO2, Arterial: 67.5 mmHg — ABNORMAL LOW (ref 80.0–100.0)
pO2, Arterial: 94.8 mmHg (ref 80.0–100.0)

## 2015-05-30 LAB — RAPID URINE DRUG SCREEN, HOSP PERFORMED
Amphetamines: NOT DETECTED
BARBITURATES: NOT DETECTED
Benzodiazepines: NOT DETECTED
COCAINE: NOT DETECTED
Opiates: POSITIVE — AB
TETRAHYDROCANNABINOL: NOT DETECTED

## 2015-05-30 LAB — PROTEIN AND GLUCOSE, CSF
GLUCOSE CSF: 89 mg/dL — AB (ref 40–70)
Total  Protein, CSF: 27 mg/dL (ref 15–45)

## 2015-05-30 LAB — LACTIC ACID, PLASMA
Lactic Acid, Venous: 3.7 mmol/L (ref 0.5–2.0)
Lactic Acid, Venous: 4.3 mmol/L (ref 0.5–2.0)
Lactic Acid, Venous: 4.9 mmol/L (ref 0.5–2.0)

## 2015-05-30 LAB — BASIC METABOLIC PANEL
ANION GAP: 12 (ref 5–15)
Anion gap: 12 (ref 5–15)
BUN: 34 mg/dL — AB (ref 6–20)
BUN: 32 mg/dL — ABNORMAL HIGH (ref 6–20)
CHLORIDE: 101 mmol/L (ref 101–111)
CHLORIDE: 104 mmol/L (ref 101–111)
CO2: 19 mmol/L — AB (ref 22–32)
CO2: 16 mmol/L — AB (ref 22–32)
CREATININE: 0.77 mg/dL (ref 0.44–1.00)
Calcium: 7.4 mg/dL — ABNORMAL LOW (ref 8.9–10.3)
Calcium: 7.1 mg/dL — ABNORMAL LOW (ref 8.9–10.3)
Creatinine, Ser: 0.74 mg/dL (ref 0.44–1.00)
GFR calc Af Amer: >60 mL/min (ref >60)
GFR calc non Af Amer: >60 mL/min (ref >60)
GFR calc non Af Amer: >60 mL/min (ref >60)
GLUCOSE: 164 mg/dL — AB (ref 65–99)
Glucose, Bld: 186 mg/dL — ABNORMAL HIGH (ref 65–99)
POTASSIUM: 4.4 mmol/L (ref 3.5–5.1)
POTASSIUM: 4.6 mmol/L (ref 3.5–5.1)
SODIUM: 132 mmol/L — AB (ref 135–145)
Sodium: 132 mmol/L — ABNORMAL LOW (ref 135–145)

## 2015-05-30 LAB — URINALYSIS, ROUTINE W REFLEX MICROSCOPIC
Bilirubin Urine: NEGATIVE
GLUCOSE, UA: NEGATIVE mg/dL
KETONES UR: NEGATIVE mg/dL
LEUKOCYTES UA: NEGATIVE
NITRITE: NEGATIVE
PROTEIN: 30 mg/dL — AB
Specific Gravity, Urine: 1.027 (ref 1.005–1.030)
pH: 6 (ref 5.0–8.0)

## 2015-05-30 LAB — POCT I-STAT 3, ART BLOOD GAS (G3+)
Acid-base deficit: 12 mmol/L — ABNORMAL HIGH (ref 0.0–2.0)
Bicarbonate: 14.1 mEq/L — ABNORMAL LOW (ref 20.0–24.0)
O2 Saturation: 96 %
PCO2 ART: 30.6 mmHg — AB (ref 35.0–45.0)
PH ART: 7.269 — AB (ref 7.350–7.450)
Patient temperature: 97.6
TCO2: 15 mmol/L (ref 0–100)
pO2, Arterial: 87 mmHg (ref 80.0–100.0)

## 2015-05-30 LAB — PROCALCITONIN
PROCALCITONIN: 4.86 ng/mL
Procalcitonin: 3.89 ng/mL

## 2015-05-30 LAB — GLUCOSE, CAPILLARY
GLUCOSE-CAPILLARY: 142 mg/dL — AB (ref 65–99)
GLUCOSE-CAPILLARY: 167 mg/dL — AB (ref 65–99)

## 2015-05-30 LAB — COMPREHENSIVE METABOLIC PANEL
ALBUMIN: 1.8 g/dL — AB (ref 3.5–5.0)
ALT: 38 U/L (ref 14–54)
AST: 84 U/L — AB (ref 15–41)
Alkaline Phosphatase: 43 U/L (ref 38–126)
Anion gap: 13 (ref 5–15)
BILIRUBIN TOTAL: 1 mg/dL (ref 0.3–1.2)
BUN: 39 mg/dL — AB (ref 6–20)
CO2: 15 mmol/L — ABNORMAL LOW (ref 22–32)
CREATININE: 0.84 mg/dL (ref 0.44–1.00)
Calcium: 6.9 mg/dL — ABNORMAL LOW (ref 8.9–10.3)
Chloride: 101 mmol/L (ref 101–111)
GFR calc Af Amer: >60 mL/min (ref >60)
GFR calc non Af Amer: >60 mL/min (ref >60)
GLUCOSE: 150 mg/dL — AB (ref 65–99)
POTASSIUM: 4.4 mmol/L (ref 3.5–5.1)
Sodium: 129 mmol/L — ABNORMAL LOW (ref 135–145)
TOTAL PROTEIN: 3.8 g/dL — AB (ref 6.5–8.1)

## 2015-05-30 LAB — APTT: aPTT: 33 seconds (ref 24–37)

## 2015-05-30 LAB — URINE MICROSCOPIC-ADD ON

## 2015-05-30 LAB — CSF CELL COUNT WITH DIFFERENTIAL
RBC COUNT CSF: 0 /mm3
RBC Count, CSF: 81 /mm3 — ABNORMAL HIGH
TUBE #: 1
TUBE #: 4
WBC, CSF: 2 /mm3 (ref 0–5)
WBC, CSF: 2 /mm3 (ref 0–5)

## 2015-05-30 LAB — CARBOXYHEMOGLOBIN
CARBOXYHEMOGLOBIN: 0.7 % (ref 0.5–1.5)
METHEMOGLOBIN: 0.8 % (ref 0.0–1.5)
O2 Saturation: 81.8 %
Total hemoglobin: 14 g/dL (ref 12.0–16.0)

## 2015-05-30 LAB — BRAIN NATRIURETIC PEPTIDE: B Natriuretic Peptide: 149 pg/mL — ABNORMAL HIGH (ref 0.0–100.0)

## 2015-05-30 LAB — I-STAT TROPONIN, ED: Troponin i, poc: 0 ng/mL (ref 0.00–0.08)

## 2015-05-30 LAB — CORTISOL: Cortisol, Plasma: 23 ug/dL

## 2015-05-30 LAB — I-STAT CG4 LACTIC ACID, ED: Lactic Acid, Venous: 8.08 mmol/L (ref 0.5–2.0)

## 2015-05-30 LAB — PROTIME-INR
INR: 1.45 (ref 0.00–1.49)
Prothrombin Time: 17.7 seconds — ABNORMAL HIGH (ref 11.6–15.2)

## 2015-05-30 LAB — PHOSPHORUS: PHOSPHORUS: 2.5 mg/dL (ref 2.5–4.6)

## 2015-05-30 LAB — STREP PNEUMONIAE URINARY ANTIGEN: STREP PNEUMO URINARY ANTIGEN: NEGATIVE

## 2015-05-30 LAB — MAGNESIUM: MAGNESIUM: 2.3 mg/dL (ref 1.7–2.4)

## 2015-05-30 LAB — MRSA PCR SCREENING: MRSA by PCR: NEGATIVE

## 2015-05-30 MED ORDER — SODIUM CHLORIDE 0.9 % IV SOLN
0.0000 ug/min | INTRAVENOUS | Status: DC
  Administered 2015-05-30: 20 ug/min via INTRAVENOUS
  Administered 2015-05-31: 100 ug/min via INTRAVENOUS
  Filled 2015-05-30 (×4): qty 4

## 2015-05-30 MED ORDER — DEXTROSE 5 % IV SOLN
2.0000 g | Freq: Three times a day (TID) | INTRAVENOUS | Status: DC
  Administered 2015-05-30 – 2015-05-31 (×4): 2 g via INTRAVENOUS
  Filled 2015-05-30 (×7): qty 2

## 2015-05-30 MED ORDER — HYDROCORTISONE NA SUCCINATE PF 100 MG IJ SOLR
50.0000 mg | Freq: Four times a day (QID) | INTRAMUSCULAR | Status: DC
  Administered 2015-05-30 – 2015-05-31 (×4): 50 mg via INTRAVENOUS
  Filled 2015-05-30: qty 1
  Filled 2015-05-30: qty 2
  Filled 2015-05-30 (×2): qty 1
  Filled 2015-05-30 (×3): qty 2
  Filled 2015-05-30: qty 1

## 2015-05-30 MED ORDER — DEXMEDETOMIDINE HCL IN NACL 200 MCG/50ML IV SOLN
0.4000 ug/kg/h | INTRAVENOUS | Status: DC
  Administered 2015-05-30: 0.4 ug/kg/h via INTRAVENOUS
  Filled 2015-05-30: qty 50

## 2015-05-30 MED ORDER — FENTANYL CITRATE (PF) 100 MCG/2ML IJ SOLN
INTRAMUSCULAR | Status: AC
  Administered 2015-05-30: 50 ug
  Filled 2015-05-30: qty 4

## 2015-05-30 MED ORDER — SODIUM CHLORIDE 0.9 % IV BOLUS (SEPSIS)
1000.0000 mL | Freq: Once | INTRAVENOUS | Status: AC
  Administered 2015-05-30: 1000 mL via INTRAVENOUS

## 2015-05-30 MED ORDER — FENTANYL CITRATE (PF) 100 MCG/2ML IJ SOLN
100.0000 ug | Freq: Once | INTRAMUSCULAR | Status: AC | PRN
  Administered 2015-06-02: 100 ug via INTRAVENOUS
  Filled 2015-05-30: qty 2

## 2015-05-30 MED ORDER — SODIUM CHLORIDE 0.9 % IV SOLN
3.0000 ug/kg/min | INTRAVENOUS | Status: DC
  Administered 2015-05-30: 0.743 ug/kg/min via INTRAVENOUS
  Filled 2015-05-30: qty 20

## 2015-05-30 MED ORDER — LEVOFLOXACIN IN D5W 750 MG/150ML IV SOLN
750.0000 mg | Freq: Once | INTRAVENOUS | Status: AC
  Administered 2015-05-30: 750 mg via INTRAVENOUS
  Filled 2015-05-30: qty 150

## 2015-05-30 MED ORDER — SODIUM CHLORIDE 0.9 % IV BOLUS (SEPSIS)
1000.0000 mL | INTRAVENOUS | Status: AC
  Administered 2015-05-30: 1000 mL via INTRAVENOUS

## 2015-05-30 MED ORDER — ASPIRIN 81 MG PO CHEW: 324.0000 mg | CHEWABLE_TABLET | ORAL | Status: AC

## 2015-05-30 MED ORDER — DEXTROSE 5 % IV SOLN
0.0000 ug/min | INTRAVENOUS | Status: DC
  Administered 2015-05-30: 10 ug/min via INTRAVENOUS
  Filled 2015-05-30: qty 4

## 2015-05-30 MED ORDER — VANCOMYCIN HCL IN DEXTROSE 1-5 GM/200ML-% IV SOLN
1000.0000 mg | Freq: Once | INTRAVENOUS | Status: AC
  Administered 2015-05-30: 1000 mg via INTRAVENOUS
  Filled 2015-05-30: qty 200

## 2015-05-30 MED ORDER — PROPOFOL 1000 MG/100ML IV EMUL
5.0000 ug/kg/min | INTRAVENOUS | Status: DC
  Administered 2015-05-30: 1000 mg via INTRAVENOUS
  Filled 2015-05-30: qty 100

## 2015-05-30 MED ORDER — SODIUM CHLORIDE 0.9 % IV SOLN
0.0000 mg/h | INTRAVENOUS | Status: DC
  Administered 2015-05-30: 2 mg/h via INTRAVENOUS
  Filled 2015-05-30: qty 10

## 2015-05-30 MED ORDER — FAMOTIDINE IN NACL 20-0.9 MG/50ML-% IV SOLN
20.0000 mg | Freq: Two times a day (BID) | INTRAVENOUS | Status: DC
  Administered 2015-05-30 – 2015-06-05 (×14): 20 mg via INTRAVENOUS
  Filled 2015-05-30 (×16): qty 50

## 2015-05-30 MED ORDER — FENTANYL CITRATE (PF) 100 MCG/2ML IJ SOLN
INTRAMUSCULAR | Status: AC
  Filled 2015-05-30: qty 2

## 2015-05-30 MED ORDER — PROPOFOL 1000 MG/100ML IV EMUL
INTRAVENOUS | Status: AC
  Administered 2015-05-30: 1000 mg via INTRAVENOUS
  Filled 2015-05-30: qty 100

## 2015-05-30 MED ORDER — LEVOFLOXACIN IN D5W 750 MG/150ML IV SOLN
750.0000 mg | INTRAVENOUS | Status: DC
  Administered 2015-05-30 – 2015-06-01 (×3): 750 mg via INTRAVENOUS
  Filled 2015-05-30 (×4): qty 150

## 2015-05-30 MED ORDER — VANCOMYCIN HCL 500 MG IV SOLR
500.0000 mg | Freq: Two times a day (BID) | INTRAVENOUS | Status: DC
  Administered 2015-05-30 – 2015-05-31 (×3): 500 mg via INTRAVENOUS
  Filled 2015-05-30 (×4): qty 500

## 2015-05-30 MED ORDER — FENTANYL CITRATE (PF) 100 MCG/2ML IJ SOLN: 100.0000 ug | Freq: Once | INTRAMUSCULAR | Status: AC

## 2015-05-30 MED ORDER — ARTIFICIAL TEARS OP OINT
1.0000 "application " | TOPICAL_OINTMENT | Freq: Three times a day (TID) | OPHTHALMIC | Status: DC
  Administered 2015-05-30 – 2015-06-01 (×6): 1 via OPHTHALMIC
  Filled 2015-05-30 (×2): qty 3.5

## 2015-05-30 MED ORDER — FENTANYL CITRATE (PF) 100 MCG/2ML IJ SOLN
INTRAMUSCULAR | Status: AC
  Administered 2015-05-30: 50 ug
  Filled 2015-05-30: qty 2

## 2015-05-30 MED ORDER — METOPROLOL TARTRATE 1 MG/ML IV SOLN
INTRAVENOUS | Status: AC
  Administered 2015-05-30: 2.5 mg
  Filled 2015-05-30: qty 5

## 2015-05-30 MED ORDER — LACTATED RINGERS IV SOLN
INTRAVENOUS | Status: DC
  Administered 2015-05-30: 05:00:00 via INTRAVENOUS

## 2015-05-30 MED ORDER — ALBUTEROL SULFATE (2.5 MG/3ML) 0.083% IN NEBU
2.5000 mg | INHALATION_SOLUTION | RESPIRATORY_TRACT | Status: DC
  Administered 2015-05-30 – 2015-06-01 (×14): 2.5 mg via RESPIRATORY_TRACT
  Filled 2015-05-30 (×14): qty 3

## 2015-05-30 MED ORDER — FENTANYL BOLUS VIA INFUSION
50.0000 ug | INTRAVENOUS | Status: DC | PRN
  Filled 2015-05-30: qty 50

## 2015-05-30 MED ORDER — ACETAMINOPHEN 650 MG RE SUPP
650.0000 mg | Freq: Once | RECTAL | Status: AC
Start: 2015-05-30 — End: 2015-05-30
  Administered 2015-05-30: 650 mg via RECTAL
  Filled 2015-05-30: qty 1

## 2015-05-30 MED ORDER — MIDAZOLAM HCL 2 MG/2ML IJ SOLN
2.0000 mg | Freq: Once | INTRAMUSCULAR | Status: AC | PRN
  Administered 2015-06-02: 2 mg via INTRAVENOUS
  Filled 2015-05-30: qty 2

## 2015-05-30 MED ORDER — CHLORHEXIDINE GLUCONATE 0.12% ORAL RINSE (MEDLINE KIT)
15.0000 mL | Freq: Two times a day (BID) | OROMUCOSAL | Status: DC
  Administered 2015-05-30 – 2015-06-16 (×30): 15 mL via OROMUCOSAL

## 2015-05-30 MED ORDER — HEPARIN SODIUM (PORCINE) 5000 UNIT/ML IJ SOLN
5000.0000 [IU] | Freq: Three times a day (TID) | INTRAMUSCULAR | Status: DC
  Administered 2015-05-30 – 2015-06-03 (×14): 5000 [IU] via SUBCUTANEOUS
  Filled 2015-05-30 (×16): qty 1

## 2015-05-30 MED ORDER — FENTANYL CITRATE (PF) 100 MCG/2ML IJ SOLN
100.0000 ug | Freq: Once | INTRAMUSCULAR | Status: AC
  Administered 2015-05-30: 100 ug via INTRAVENOUS

## 2015-05-30 MED ORDER — DEXTROSE 5 % IV SOLN
2.0000 g | Freq: Once | INTRAVENOUS | Status: AC
  Administered 2015-05-30: 2 g via INTRAVENOUS
  Filled 2015-05-30: qty 2

## 2015-05-30 MED ORDER — SODIUM CHLORIDE 0.9 % IV SOLN
10.0000 ug/h | INTRAVENOUS | Status: DC
  Administered 2015-05-30: 50 ug/h via INTRAVENOUS
  Filled 2015-05-30: qty 50

## 2015-05-30 MED ORDER — SODIUM CHLORIDE 0.9 % IV SOLN
250.0000 mL | INTRAVENOUS | Status: DC | PRN
  Administered 2015-06-01 – 2015-06-02 (×2): 250 mL via INTRAVENOUS

## 2015-05-30 MED ORDER — SODIUM CHLORIDE 0.9 % IV SOLN
0.0000 ug/min | INTRAVENOUS | Status: DC
  Filled 2015-05-30: qty 16

## 2015-05-30 MED ORDER — IOHEXOL 350 MG/ML SOLN
80.0000 mL | Freq: Once | INTRAVENOUS | Status: AC | PRN
  Administered 2015-05-30: 100 mL via INTRAVENOUS

## 2015-05-30 MED ORDER — ACETAMINOPHEN 325 MG PO TABS
650.0000 mg | ORAL_TABLET | ORAL | Status: DC | PRN
  Filled 2015-05-30: qty 2

## 2015-05-30 MED ORDER — LEVOTHYROXINE SODIUM 100 MCG IV SOLR
50.0000 ug | Freq: Every day | INTRAVENOUS | Status: DC
  Administered 2015-05-30 – 2015-06-04 (×6): 50 ug via INTRAVENOUS
  Filled 2015-05-30 (×7): qty 5

## 2015-05-30 MED ORDER — DEXTROSE 5 % IV SOLN
500.0000 mg | Freq: Once | INTRAVENOUS | Status: DC
  Administered 2015-05-30: 500 mg via INTRAVENOUS
  Filled 2015-05-30: qty 10

## 2015-05-30 MED ORDER — DEXAMETHASONE SODIUM PHOSPHATE 10 MG/ML IJ SOLN
10.0000 mg | Freq: Once | INTRAMUSCULAR | Status: AC
  Administered 2015-05-30: 10 mg via INTRAVENOUS
  Filled 2015-05-30: qty 1

## 2015-05-30 MED ORDER — SODIUM CHLORIDE 0.9 % IV SOLN: INTRAVENOUS | Status: DC

## 2015-05-30 MED ORDER — METOPROLOL TARTRATE 1 MG/ML IV SOLN: 2.5000 mg | Freq: Once | INTRAVENOUS | Status: AC

## 2015-05-30 MED ORDER — ANTISEPTIC ORAL RINSE SOLUTION (CORINZ)
7.0000 mL | OROMUCOSAL | Status: DC
  Administered 2015-05-30 – 2015-06-05 (×57): 7 mL via OROMUCOSAL

## 2015-05-30 MED ORDER — ASPIRIN 300 MG RE SUPP: 300.0000 mg | RECTAL | Status: AC

## 2015-05-30 MED ORDER — CISATRACURIUM BOLUS VIA INFUSION
0.1000 mg/kg | Freq: Once | INTRAVENOUS | Status: AC
  Administered 2015-05-30: 6.7 mg via INTRAVENOUS
  Filled 2015-05-30: qty 7

## 2015-05-30 MED ORDER — SODIUM CHLORIDE 0.9 % IV SOLN
INTRAVENOUS | Status: DC
  Administered 2015-05-30: 16:00:00 via INTRAVENOUS
  Administered 2015-05-31: 125 mL/h via INTRAVENOUS
  Administered 2015-06-04 – 2015-06-13 (×3): via INTRAVENOUS

## 2015-05-30 MED ORDER — MIDAZOLAM BOLUS VIA INFUSION
1.0000 mg | INTRAVENOUS | Status: DC | PRN
  Administered 2015-06-01: 2 mg via INTRAVENOUS
  Filled 2015-05-30 (×2): qty 2

## 2015-05-30 MED ORDER — DEXTROSE 5 % IV SOLN
500.0000 mg | Freq: Three times a day (TID) | INTRAVENOUS | Status: DC
  Filled 2015-05-30 (×3): qty 10

## 2015-05-30 MED ORDER — OSELTAMIVIR PHOSPHATE 6 MG/ML PO SUSR
75.0000 mg | Freq: Two times a day (BID) | ORAL | Status: DC
  Filled 2015-05-30 (×4): qty 12.5

## 2015-05-30 MED ORDER — SODIUM CHLORIDE 0.9 % IV SOLN
1.0000 mg/h | INTRAVENOUS | Status: DC
  Administered 2015-05-30: 3 mg/h via INTRAVENOUS
  Administered 2015-05-31: 5 mg/h via INTRAVENOUS
  Administered 2015-05-31: 3 mg/h via INTRAVENOUS
  Administered 2015-06-01 (×2): 6 mg/h via INTRAVENOUS
  Filled 2015-05-30 (×5): qty 10

## 2015-05-30 MED ORDER — FENTANYL CITRATE (PF) 100 MCG/2ML IJ SOLN
50.0000 ug | Freq: Once | INTRAMUSCULAR | Status: AC
  Administered 2015-05-30: 50 ug via INTRAVENOUS

## 2015-05-30 MED ORDER — MIDAZOLAM BOLUS VIA INFUSION
2.0000 mg | INTRAVENOUS | Status: DC | PRN
  Filled 2015-05-30: qty 2

## 2015-05-30 MED ORDER — DEXTROSE 5 % IV SOLN
5.0000 ug/min | INTRAVENOUS | Status: DC
  Filled 2015-05-30: qty 4

## 2015-05-30 MED ORDER — MIDAZOLAM HCL 2 MG/2ML IJ SOLN: 2.0000 mg | Freq: Once | INTRAMUSCULAR | Status: AC

## 2015-05-30 MED ORDER — SODIUM CHLORIDE 0.9 % IV SOLN
25.0000 ug/h | INTRAVENOUS | Status: DC
  Administered 2015-05-31: 175 ug/h via INTRAVENOUS
  Administered 2015-05-31: 250 ug/h via INTRAVENOUS
  Administered 2015-06-01 (×2): 300 ug/h via INTRAVENOUS
  Administered 2015-06-02: 100 ug/h via INTRAVENOUS
  Filled 2015-05-30 (×7): qty 50

## 2015-05-30 NOTE — Procedures (Signed)
Central Venous Catheter Insertion Procedure Note [name] [name] [id number] Feb 18, 1956  Procedure: Insertion of Central Venous Catheter Indications: Assessment of intravascular volume, Drug and/or fluid administration and Frequent blood sampling  Procedure Details Consent: Risks of procedure as well as the alternatives and risks of each were explained to the (patient/caregiver).  Consent for procedure obtained. Time Out: Verified patient identification, verified procedure, site/side was marked, verified correct patient position, special equipment/implants available, medications/allergies/relevent history reviewed, required imaging and test results available.  Performed  Maximum sterile technique was used including antiseptics, cap, gloves, gown, hand hygiene, mask and sheet. Skin prep: Chlorhexidine; local anesthetic administered A antimicrobial bonded/coated triple lumen catheter was placed in the right subclavian vein using the Seldinger technique.  Evaluation Blood flow good Complications: No apparent complications Patient did tolerate procedure well. Chest X-ray ordered to verify placement.  CXR: normal.  [name], [name] K. 05/30/2015, 5:30 AM

## 2015-05-30 NOTE — Progress Notes (Signed)
Patient brought in by EMS on 100% NRB. Dr. Leonides Schanz ordered for patient to be placed on Bipap with the settings of IPAP 16cm and EPAP set at 8cm and 100% oxygen.

## 2015-05-30 NOTE — Code Documentation (Signed)
[name] [name] is a 60yo wf presenting tot he [facility] via [place] for sudden onset Rt side weakness and facial droop.  Per report the pt had a positive fly swab 2 days ago and was started on Tamiflu which she has taken.  On arrival to the [facility] she was found to have dusky finger tips and sats 77% on 2L with a good waveform.  EMS states they could not obtain accurate sats during transport.  Pt was taken directly to a room and placed on Bipap.  Neuro exam prior to Bipap revealed facial droop and Lt arm drift, NIH 3.

## 2015-05-30 NOTE — H&P (Addendum)
PULMONARY / CRITICAL CARE MEDICINE   Name: [name] [name] MRN: [id number] DOB: 1955-05-13    ADMISSION DATE:  05/29/2015 CONSULTATION DATE:  05/30/15   REFERRING MD:  Dr. Leonides Schanz  CHIEF COMPLAINT:  Hypoxic respiratory failure  HISTORY OF PRESENT ILLNESS:   [name] [name] is a 60 y/o woman with a history of Sjogren's disease, hypothyroidism and dysphagia.  She was diagnosed with the flu (A) about 4 days ago and started on tamiflu.  According to her husband, she had actually been improving the 24 hours prior to admission with fever resolving and improvement in her nausea and vomiting.  He reports that the evening of admission she developed weakness and altered mental status.  She was brought to [facility] ED via ambulance.  She was initially thought to be a code stroke with a right sided facial droop and flaccid paralysis of her right arm.  Of note she does have a history of seizure but is not on antiepileptics and had no witnessed seizure activity.  In the ED she was found to be in respiratory distress and acutely hypoxic on a non-rebreather.  A chest x-ray showed a dense right sided infiltrate with volume loss. She was also noted to have a diffuse red papular rash.  She is on plaquenil for her Sjogren's disease but is not on any other immunosuppressive agents.  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   PAST MEDICAL HISTORY :  She  has a past medical history of Hypothyroidism; Seizures ([place]); Adenopathy; Scoliosis; Spleen anomaly; Anemia; Sjogren's disease ([place]); Raynaud disease; Shingles; and Osteopenia.  PAST SURGICAL HISTORY: She  has past surgical history that includes Tonsillectomy and Breast cyst aspiration (Right, 1990's).  Allergies  Allergen Reactions  . Erythromycin Ethylsuccinate   . Keflex [Cephalexin]   . Penicillins   . Sulfa Antibiotics     No current facility-administered medications on file prior to encounter.   Current Outpatient Prescriptions on File Prior to  Encounter  Medication Sig  . cetirizine (ZYRTEC) 10 MG tablet Take 10 mg by mouth daily.  Marland Kitchen conjugated estrogens (PREMARIN) vaginal cream Place 1 Applicatorful vaginally 2 (two) times a week.  . fluticasone (FLONASE) 50 MCG/ACT nasal spray Place 2 sprays into both nostrils daily.  . hydroxychloroquine (PLAQUENIL) 200 MG tablet Take by mouth daily.  Marland Kitchen levothyroxine (SYNTHROID, LEVOTHROID) 112 MCG tablet Take 112 mcg by mouth daily before breakfast.  . Multiple Vitamin (MULTIVITAMIN) tablet Take 1 tablet by mouth daily.  . predniSONE (DELTASONE) 10 MG tablet Take 10 mg by mouth daily with breakfast.  . tri-vitamin w/ fluoride (TRI-VI-SOL) 0.25 MG/ML solution Take 0.25 mg by mouth daily.  . vitamin B-12 (CYANOCOBALAMIN) 1000 MCG tablet Take 1,000 mcg by mouth daily.    FAMILY HISTORY:  Her has no family status information on file.   SOCIAL HISTORY: She  reports that she has never smoked. She has never used smokeless tobacco. She reports that she does not drink alcohol or use illicit drugs.  REVIEW OF SYSTEMS:   A review of 14 systems was negative except as stated in the HPI.   SUBJECTIVE:  60 y/o woman with influenza and right sided pna  VITAL SIGNS: BP 101/56 mmHg  Pulse 116  Temp(Src) 97.9 F (36.6 C) (Rectal)  Resp 28  Ht 5\' 2"  (1.575 m)  Wt 54.432 kg (120 lb)  BMI 21.94 kg/m2  SpO2 95%  HEMODYNAMICS:    VENTILATOR SETTINGS: Vent Mode:  [-] PRVC FiO2 (%):  [  80 %] 80 % Set Rate:  [28 bmp] 28 bmp Vt Set:  [350 mL] 350 mL PEEP:  [12 cmH20] 12 cmH20 Plateau Pressure:  [22 [id number] cmH20] 22 cmH20  INTAKE / OUTPUT: I/O last 3 completed shifts: In: 2050 [IV Piggyback:2050] Out: -   PHYSICAL EXAMINATION: Physical Exam  Constitutional: She appears well-developed and well-nourished.  Intubated, sedated  HENT:  Head: Normocephalic and atraumatic.  Nose: Nose normal.  Mouth/Throat: Oropharynx is clear and moist.  ETT in place  Eyes: EOM are normal. Pupils are equal,  round, and reactive to light.  Neck: No JVD present.  Somewhat stiff   Cardiovascular: Normal heart sounds and intact distal pulses.  Exam reveals no gallop and no friction rub.   No murmur heard. Tacchycardic, irregularly irregular  Pulmonary/Chest: No stridor. No respiratory distress.  Diminished breath sounds over right anterior and lateral chest.   Abdominal: Soft. Bowel sounds are normal. She exhibits no distension and no mass. There is no tenderness.  Musculoskeletal: She exhibits no edema.  Neurological:  sedated  Skin: Skin is warm and dry. No rash noted. No erythema.     LABS:  BMET  Recent Labs Lab 05/30/15 0014 05/30/15 0202 05/30/15 0520  NA 124* 129* 132*  K 5.5* 4.4 4.4  CL 93* 101 101  CO2  --  15* 19*  BUN 71* 39* 34*  CREATININE 1.10* 0.84 0.74  GLUCOSE 137* 150* 164*    Electrolytes  Recent Labs Lab 05/30/15 0202 05/30/15 0520  CALCIUM 6.9* 7.4*    CBC  Recent Labs Lab 05/30/15 0014 05/30/15 0500  WBC  --  13.8*  HGB 16.7* 13.6  HCT 49.0* 40.9  PLT  --  266    Coag's  Recent Labs Lab 05/30/15 0202  APTT 33  INR 1.45    Sepsis Markers  Recent Labs Lab 05/30/15 0014 05/30/15 0202 05/30/15 0520  LATICACIDVEN 8.08*  --  3.7*  PROCALCITON  --  3.89  --     ABG  Recent Labs Lab 05/30/15 0009 05/30/15 0215 05/30/15 0436  PHART 7.383 7.213* 7.243*  PCO2ART 28.4* 44.1 42.0  PO2ART 64.0* 68.0* 67.5*    Liver Enzymes  Recent Labs Lab 05/30/15 0202  AST 84*  ALT 38  ALKPHOS 43  BILITOT 1.0  ALBUMIN 1.8*    Cardiac Enzymes No results for input(s): TROPONINI, PROBNP in the last 168 hours.  Glucose No results for input(s): GLUCAP in the last 168 hours.  Imaging Ct Head Wo Contrast  05/30/2015  CLINICAL DATA:  Acute onset of right-sided facial droop and altered mental status. Right arm weakness. Initial encounter. EXAM: CT HEAD WITHOUT CONTRAST TECHNIQUE: Contiguous axial images were obtained from the base of  the skull through the vertex without intravenous contrast. COMPARISON:  None. FINDINGS: There is no evidence of acute infarction, mass lesion, or intra- or extra-axial hemorrhage on CT. The posterior fossa, including the cerebellum, brainstem and fourth ventricle, is within normal limits. The third and lateral ventricles, and basal ganglia are unremarkable in appearance. The cerebral hemispheres are symmetric in appearance, with normal gray-white differentiation. No mass effect or midline shift is seen. There is no evidence of fracture; visualized osseous structures are unremarkable in appearance. The visualized portions of the orbits are within normal limits. Mild mucosal thickening is noted at the maxillary sinuses bilaterally and at the ethmoid air cells. The remaining paranasal sinuses and mastoid air cells are well-aerated. No significant soft tissue abnormalities are seen. IMPRESSION: 1. No definite  acute intracranial pathology seen on CT. 2. Mild mucosal thickening at the maxillary sinuses bilaterally. Electronically Signed   By: Garald Balding M.D.   On: 05/30/2015 03:23   Ct Angio Chest Pe W/cm &/or Wo Cm  05/30/2015  CLINICAL DATA:  60 year old female with hypoxia EXAM: CT ANGIOGRAPHY CHEST WITH CONTRAST TECHNIQUE: Multidetector CT imaging of the chest was performed using the standard protocol during bolus administration of intravenous contrast. Multiplanar CT image reconstructions and MIPs were obtained to evaluate the vascular anatomy. CONTRAST:  191mL OMNIPAQUE IOHEXOL 350 MG/ML SOLN COMPARISON:  Radiograph dated 05/30/2015 FINDINGS: There are large consolidative changes with air bronchogram involving the right upper lobe, right middle lobe, and right lower lobe. Scattered ground-glass and nodular density noted in the right apical region. There is a patchy area of consolidative change with air bronchogram in the left lower lobe with scattered ground-glass and nodular airspace opacity in the left  lower lobe and lingula. No pleural effusion or pneumothorax. An endotracheal tube is noted with tip above the carina. The central airways are patent. The thoracic aorta appears unremarkable. There is mild prominence of the pulmonary trunk concerning for a degree of pulmonary hypertension. There is no CT evidence of pulmonary embolism. There bilateral hilar adenopathy. There is no cardiomegaly or pericardial effusion. The esophagus is grossly unremarkable. The chest wall soft tissues appear unremarkable. The osseous structures are intact. A paddle megaly. The visualized upper abdomen is otherwise grossly unremarkable. Review of the MIP images confirms the above findings. IMPRESSION: No CT evidence of pulmonary embolism. Extensive bilateral consolidative changes and scattered ground-glass pulmonary nodules compatible with pneumonia. Clinical correlation and follow-up resolution recommended Electronically Signed   By: Anner Crete M.D.   On: 05/30/2015 03:26   Dg Chest Port 1 View  05/30/2015  CLINICAL DATA:  Central line placement.  Initial encounter. EXAM: PORTABLE CHEST 1 VIEW COMPARISON:  Chest radiograph and CTA of the chest performed earlier today at 3:01 a.m. FINDINGS: A right subclavian line is noted ending about the cavoatrial junction. The patient's endotracheal tube is seen ending 2-3 cm above the carina. Dense right-sided and mild left basilar airspace opacities are again noted, compatible with multifocal pneumonia. No pleural effusion or pneumothorax is seen. The cardiomediastinal silhouette is normal in size. No acute osseous abnormalities are identified. IMPRESSION: 1. Right subclavian line noted ending about the cavoatrial junction. 2. Endotracheal tube seen ending 2-3 cm above the carina. 3. Bilateral pneumonia, worse on the right. Electronically Signed   By: Garald Balding M.D.   On: 05/30/2015 05:35   Dg Chest Port 1 View  05/30/2015  CLINICAL DATA:  Status post endotracheal tube placement.  Initial encounter. EXAM: PORTABLE CHEST 1 VIEW COMPARISON:  Chest radiograph performed earlier today at 12:09 a.m. FINDINGS: The patient's endotracheal tube is seen ending 3-4 cm above the carina. Worsening right-sided pneumonia is noted. Mild patchy left basilar airspace opacity is also seen. No pleural effusion or pneumothorax is identified. The cardiomediastinal silhouette is borderline normal in size. No acute osseous abnormalities are identified. IMPRESSION: 1. Endotracheal tube seen ending 3-4 cm above the carina. 2. Worsening right-sided pneumonia noted. Mild patchy left basilar airspace opacity also seen. Electronically Signed   By: Garald Balding M.D.   On: 05/30/2015 02:13   Dg Chest Port 1 View  05/30/2015  CLINICAL DATA:  60 year old female with hypoxia. EXAM: PORTABLE CHEST 1 VIEW COMPARISON:  CT dated 08/11/2005 FINDINGS: There is a large area of consolidative change involving right mid and  lower lung field with air bronchograms compatible with pneumonia. Underlying mass is not excluded. Clinical correlation and follow-up resolution is recommended. The left lung is clear. No pleural effusion or pneumothorax. Cardiac silhouette is within normal limits. The osseous structures are unremarkable. IMPRESSION: Large right lung consolidative changes as described. Clinical correlation and follow-up to resolution recommended. Electronically Signed   By: Anner Crete M.D.   On: 05/30/2015 00:26     STUDIES:  Head CT 2/12 Chest CT 2/12  CULTURES: Blood 2/12 Urine 2/12  ANTIBIOTICS: Vanc, aztreonam, acyclovir, levaquin 2/12-  SIGNIFICANT EVENTS: Intubated in ED  LINES/TUBES: Right subclavian CVC 2/12 Foley 2/12 ETT 2/12  DISCUSSION: 60 y/o with influenza A and right sided pneumonia in significant respiratory distress, intubated for hypoxic respiratory failure.   ASSESSMENT / PLAN:  PULMONARY A: Pneumonia P:   Lung protective ventilation CTA done to r/o PE given initial  improvement, then worsening of symptoms.  Antibiotic for CAP  CARDIOVASCULAR A:  Septic shock P:  Fluid resuscitate Levophed to maintain MAPS>65 A-fib - monitor  RENAL A:   AKI. hyponatremia P:   Improved with fluids Na 125 on admission, 132 after fluids.   Repeat BMP at 11am. Correct no more than 8-10 in 24 hours.  May need to change fluids to 1/2 NS.  GASTROINTESTINAL A:   Question of esophageal stricture.   P:   Pt planned for EGD in the fall, not clear from medical record if this was dose.  Of note - OG tube could not be passed in either ED or ICU.  Consider GI consult.    INFECTIOUS A:   Pneumonia, r/o meningitis P:   Vanc, aztreonam, levaquin started in ED for CAP and possible bacterial meningitis. Acyclovir started for possible HSV.   LP analysis pending - if no pleurocytosis can d/x acyclovir.  Consider bronchoscopy for culture.  Endocrine: A: Hypothyroidism P: IV synthroid ordered until enteral access obtained.   NEUROLOGIC A:   Paralysis on presentation P:   RASS goal:  -2 Keep sedated for vent synchrony and to decrease metabolic demand Paralysis on admission Todd's paralysis?  Monitor for seizure activity. Head CT with no acute findings.    FAMILY  - Updates:   - Inter-disciplinary family meet or Palliative Care meeting due by:  day 7   Reginia Forts MD, PhD Pulmonary and [place] Pager: [phone]  05/30/2015, 7:01 AM

## 2015-05-30 NOTE — Progress Notes (Addendum)
Name:[name] [name]:[id number] DOB:[birth year]   ADMISSION DATE: 05/29/2015 CONSULTATION DATE: 05/30/15  REFERRING MD: Dr. Leonides Schanz  CHIEF COMPLAINT: Hypoxic respiratory failure  HISTORY OF PRESENT ILLNESS:  [name] is a 60 y/o woman with a history of Sjogren's disease, hypothyroidism and dysphagia. She was diagnosed with the flu (A) about 4 days ago and started on tamiflu. According to her husband, she had actually been improving the 24 hours prior to admission with fever resolving and improvement in her nausea and vomiting. He reports that the evening of admission she developed weakness and altered mental status. She was brought to [facility] ED via ambulance. She was initially thought to be a code stroke with a right sided facial droop and flaccid paralysis of her right arm. Of note she does have a history of seizure but is not on antiepileptics and had no witnessed seizure activity. In the ED she was found to be in respiratory distress and acutely hypoxic on a non-rebreather. A chest x-ray showed a dense right sided infiltrate with volume loss. She was also noted to have a diffuse red papular rash. She is on plaquenil for her Sjogren's disease but is not on any other immunosuppressive agents. 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   SUBJECTIVE:  60 y/o woman with influenza and right sided pna, currently with svt on levo  BP 81/45 mmHg  Pulse 162  Temp(Src) 97.2 F (36.2 C) (Core (Comment))  Resp 28  Ht 5\' 2"  (1.575 m)  Wt 120 lb (54.432 kg)  BMI 21.94 kg/m2  SpO2 95%     Recent Labs Lab 05/30/15 0014 05/30/15 0202 05/30/15 0520  NA 124* 129* 132*  K 5.5* 4.4 4.4  CL 93* 101 101  CO2  --  15* 19*  BUN 71* 39* 34*  CREATININE 1.10* 0.84 0.74  GLUCOSE 137* 150* 164*    Recent Labs Lab 05/30/15 0014 05/30/15 0500  HGB 16.7* 13.6  HCT 49.0* 40.9  WBC  --  13.8*  PLT  --  266   CBG (last 3)  No results for  input(s): GLUCAP in the last 72 hours.  ABG    Component Value Date/Time   PHART 7.243* 05/30/2015 0436   PCO2ART 42.0 05/30/2015 0436   PO2ART 67.5* 05/30/2015 0436   HCO3 17.5* 05/30/2015 0436   TCO2 18.8 05/30/2015 0436   ACIDBASEDEF 8.5* 05/30/2015 0436   O2SAT 91.0 05/30/2015 0436    Imaging Ct Head Wo Contrast  05/30/2015  CLINICAL DATA:  Acute onset of right-sided facial droop and altered mental status. Right arm weakness. Initial encounter. EXAM: CT HEAD WITHOUT CONTRAST TECHNIQUE: Contiguous axial images were obtained from the base of the skull through the vertex without intravenous contrast. COMPARISON:  None. FINDINGS: There is no evidence of acute infarction, mass lesion, or intra- or extra-axial hemorrhage on CT. The posterior fossa, including the cerebellum, brainstem and fourth ventricle, is within normal limits. The third and lateral ventricles, and basal ganglia are unremarkable in appearance. The cerebral hemispheres are symmetric in appearance, with normal gray-white differentiation. No mass effect or midline shift is seen. There is no evidence of fracture; visualized osseous structures are unremarkable in appearance. The visualized portions of the orbits are within normal limits. Mild mucosal thickening is noted at the maxillary sinuses bilaterally and at the ethmoid air cells. The remaining paranasal sinuses and mastoid air cells are well-aerated. No significant soft tissue abnormalities are seen. IMPRESSION: 1. No definite acute intracranial pathology seen  on CT. 2. Mild mucosal thickening at the maxillary sinuses bilaterally. Electronically Signed   By: Garald Balding M.D.   On: 05/30/2015 03:23   Ct Angio Chest Pe W/cm &/or Wo Cm  05/30/2015  CLINICAL DATA:  60 year old female with hypoxia EXAM: CT ANGIOGRAPHY CHEST WITH CONTRAST TECHNIQUE: Multidetector CT imaging of the chest was performed using the standard protocol during bolus administration of intravenous contrast.  Multiplanar CT image reconstructions and MIPs were obtained to evaluate the vascular anatomy. CONTRAST:  138mL OMNIPAQUE IOHEXOL 350 MG/ML SOLN COMPARISON:  Radiograph dated 05/30/2015 FINDINGS: There are large consolidative changes with air bronchogram involving the right upper lobe, right middle lobe, and right lower lobe. Scattered ground-glass and nodular density noted in the right apical region. There is a patchy area of consolidative change with air bronchogram in the left lower lobe with scattered ground-glass and nodular airspace opacity in the left lower lobe and lingula. No pleural effusion or pneumothorax. An endotracheal tube is noted with tip above the carina. The central airways are patent. The thoracic aorta appears unremarkable. There is mild prominence of the pulmonary trunk concerning for a degree of pulmonary hypertension. There is no CT evidence of pulmonary embolism. There bilateral hilar adenopathy. There is no cardiomegaly or pericardial effusion. The esophagus is grossly unremarkable. The chest wall soft tissues appear unremarkable. The osseous structures are intact. A paddle megaly. The visualized upper abdomen is otherwise grossly unremarkable. Review of the MIP images confirms the above findings. IMPRESSION: No CT evidence of pulmonary embolism. Extensive bilateral consolidative changes and scattered ground-glass pulmonary nodules compatible with pneumonia. Clinical correlation and follow-up resolution recommended Electronically Signed   By: Anner Crete M.D.   On: 05/30/2015 03:26   Dg Chest Port 1 View  05/30/2015  CLINICAL DATA:  Central line placement.  Initial encounter. EXAM: PORTABLE CHEST 1 VIEW COMPARISON:  Chest radiograph and CTA of the chest performed earlier today at 3:01 a.m. FINDINGS: A right subclavian line is noted ending about the cavoatrial junction. The patient's endotracheal tube is seen ending 2-3 cm above the carina. Dense right-sided and mild left basilar  airspace opacities are again noted, compatible with multifocal pneumonia. No pleural effusion or pneumothorax is seen. The cardiomediastinal silhouette is normal in size. No acute osseous abnormalities are identified. IMPRESSION: 1. Right subclavian line noted ending about the cavoatrial junction. 2. Endotracheal tube seen ending 2-3 cm above the carina. 3. Bilateral pneumonia, worse on the right. Electronically Signed   By: Garald Balding M.D.   On: 05/30/2015 05:35   Dg Chest Port 1 View  05/30/2015  CLINICAL DATA:  Status post endotracheal tube placement. Initial encounter. EXAM: PORTABLE CHEST 1 VIEW COMPARISON:  Chest radiograph performed earlier today at 12:09 a.m. FINDINGS: The patient's endotracheal tube is seen ending 3-4 cm above the carina. Worsening right-sided pneumonia is noted. Mild patchy left basilar airspace opacity is also seen. No pleural effusion or pneumothorax is identified. The cardiomediastinal silhouette is borderline normal in size. No acute osseous abnormalities are identified. IMPRESSION: 1. Endotracheal tube seen ending 3-4 cm above the carina. 2. Worsening right-sided pneumonia noted. Mild patchy left basilar airspace opacity also seen. Electronically Signed   By: Garald Balding M.D.   On: 05/30/2015 02:13   Dg Chest Port 1 View  05/30/2015  CLINICAL DATA:  60 year old female with hypoxia. EXAM: PORTABLE CHEST 1 VIEW COMPARISON:  CT dated 08/11/2005 FINDINGS: There is a large area of consolidative change involving right mid and lower lung field with  air bronchograms compatible with pneumonia. Underlying mass is not excluded. Clinical correlation and follow-up resolution is recommended. The left lung is clear. No pleural effusion or pneumothorax. Cardiac silhouette is within normal limits. The osseous structures are unremarkable. IMPRESSION: Large right lung consolidative changes as described. Clinical correlation and follow-up to resolution recommended. Electronically Signed    By: Anner Crete M.D.   On: 05/30/2015 00:26   STUDIES:  Head CT 2/12 Chest CT 2/12  CULTURES: Blood 2/12>>GPC chains!!!>>> Urine 2/12>>  ANTIBIOTICS: Vanc, aztreonam,  Acyclovir>>>2/12 levaquin 2/12->>  SIGNIFICANT EVENTS: Intubated in ED shock  LINES/TUBES: Right subclavian CVC 2/12>> Foley 2/12>> ETT 2/12>>  DISCUSSION: 60 y/o with influenza A and right sided pneumonia in significant respiratory distress, intubated for hypoxic respiratory failure.    PHYSICAL EXAMINATION: Physical Exam  Constitutional: She appears well-developed and well-nourished.  Intubated, sedated  HENT:  Head: Normocephalic and atraumatic.  Nose: Nose normal.  Mouth/Throat: Oropharynx is clear and moist.  ETT in place -> vent Eyes: EOM are normal. Pupils are equal, round, and reactive to light.  Neck: No JVD present.   Cardiovascular: Currently SVT 160/s , on levo. No murmur heard. Tacchycardic, irregularly irregular  Pulmonary/Chest: coarse rhonchi  Diminished breath sounds over right anterior and lateral chest.  Abdominal: Soft. Bowel sounds are normal. She exhibits no distension and no mass. There is no tenderness.  Musculoskeletal: She exhibits no edema.  Neurological:  sedated  Skin: Skin is warm and dry. No rash noted. No erythema   ASSESSMENT / PLAN:  PULMONARY A: Pneumonia ARDS NOt present as unilateral Dense infiltrate P:  Lung protective ventilation Avoid auto peep  CTA done to r/o PE given initial improvement, then worsening of symptoms.  Antibiotic for CAP May need ecmo in future  CARDIOVASCULAR A:  Septic shock SVT P:  Fluid resuscitate Check cvp Neo to maintain MAPS>65, 2/12 dc levo for svt A-fib - monitor Repeat 12 lead  Check trop Lopressor  Ensure adequate sedation  RENAL A:  AKI. Hyponatremia  Recent Labs Lab 05/30/15 0014 05/30/15 0202 05/30/15 0520  NA 124* 129* 132*    P:  Improved with fluids Na 125 on  admission, 132 after fluids.  Repeat BMP at 11am. Correct no more than 8-10 in 24 hours. May need to change fluids to 1/2 NS.  GASTROINTESTINAL A:  Question of esophageal stricture.  P:  Pt planned for EGD in the fall, not clear from medical record if this was dose. Of note - OG tube could not be passed in either ED or ICU. Consider GI consult.    INFECTIOUS A:  Pneumonia, r/o meningitis P:  Vanc, aztreonam, levaquin started in ED for CAP and possible bacterial meningitis. Acyclovir started for possible HSV.  LP analysis pending - if no pleurocytosis can d/x acyclovir.  Consider bronchoscopy for culture.  Endocrine: A: Hypothyroidism P: IV synthroid ordered until enteral access obtained.   NEUROLOGIC A:  Paralysis on presentation P:  RASS goal: -2 Keep sedated for vent synchrony and to decrease metabolic demand Paralysis on admission Todd's paralysis? Monitor for seizure activity. Head CT with no acute findings.    FAMILY  - Updates:   - Inter-disciplinary family meet or Palliative Care meeting due by: day 7      [facility] Minor ACNP Maryanna Shape PCCM Pager [phone] till 3 pm If no answer page [phone] 05/30/2015, 8:58 AM  STAFF NOTE: I, Merrie Roof, MD FACP have personally reviewed patient's available data, including medical history, events of note,  physical examination and test results as part of my evaluation. I have discussed with resident/NP and other care providers such as pharmacist, RN and RRT. In addition, I personally evaluated patient and elicited key findings of: BS rt coarse distant, has dense infiltrate rt, BC pairs, this is likely flu with strep PNA, await final id / sens, given pos BC, dc acyclovir, keep plat less 3o as able, rate to 35, peep to 15, repeat abg, good lung down for now, may need prone position, dc precedex as shock, add versed drip to fent, rass deeper, low threshold for paralysis, get cortisol, cvp goals to 12,  ensure on septic shock protocol, get hiv, to some degree immunocompromised,  determine allergy list of reaction, give stress roids after cortisol sent, svt, get lytes, avoiding levo as able, if shock worse and neo to 200 add vaso, lactic noted, bolus further, no empyema noted on chest, ensure tamiflu, may consider double dosing, follow renal fxn, i updated husband in full, cortak attempt in am , failed prior, if strep no echo, if veridans or enteroccus - echo The patient is critically ill with multiple organ systems failure and requires high complexity decision making for assessment and support, frequent evaluation and titration of therapies, application of advanced monitoring technologies and extensive interpretation of multiple databases.   Critical Care Time devoted to patient care services described in this note is 50  Minutes. This time reflects time of care of this signee: Merrie Roof, MD FACP. This critical care time does not reflect procedure time, or teaching time or supervisory time of PA/NP/Med student/Med Resident etc but could involve care discussion time. Rest per NP/medical resident whose note is outlined above and that I agree with   Lavon Paganini. Titus Mould, MD, [place] Pgr: [facility] 05/30/2015 12:37 PM

## 2015-05-30 NOTE — ED Notes (Signed)
Pt arrived via EMS from home c/o stroke like symptoms starting at 2000.  Right arm drift, slurred speech, right facial droop. Diagnosed with Pna/Flu yesterday.

## 2015-05-30 NOTE — Progress Notes (Addendum)
Pharmacy Antibiotic Note  [name] [name] is a 60 y.o. female admitted on 05/29/2015 with respiratory distress. Pt diagnosed with flu 4 days PTA and on Tamiflu. Also Code Stroke called in ED due to facial droop and flaccid paralysis - neurology following - suggest MRI to r/o acute abnormality and empiric abx for possible meningitis. Abx also for sepsis/possible PNA. LP completed after Vanc/Aztreonam/Levaquin given.  Pharmacy has been consulted for Acyclovir, Vancomycin, Aztreonam, and Levaquin dosing.  Abx given in ED: Aztreonam 2gm IV ~0040, Levaquin 750mg  IV ~0045, Vanc 1gm IV ~0120. Initial dose of Acyclovir not given in ED although ordered ~0030.  Plan: Aztreonam 2gm IV q8h Vancomycin 500mg  IV q12h Acyclovir 500mg  IV q8h Levaquin 750mg  IV q24h Will f/u micro data, renal function, and pt's clinical condition Vanc trough prn Consider narrow abx as soon as able -likely doesn't need Aztreonam Will also try to clarify allergies with pt's husband - ideally change Aztreonam and Levaquin to Meropenem if PCN allergy not anaphylaxis- pt's husband not here right now.   Height: 5\' 2"  (157.5 cm) Weight: 120 lb (54.432 kg) IBW/kg (Calculated) : 50.1  Temp (24hrs), Avg:99 F (37.2 C), Min:97.7 F (36.5 C), Max:102.1 F (38.9 C)   Recent Labs Lab 05/30/15 0014 05/30/15 0202 05/30/15 0500 05/30/15 0520  WBC  --   --  13.8*  --   CREATININE 1.10* 0.84  --  0.74  LATICACIDVEN 8.08*  --   --  3.7*    Estimated Creatinine Clearance: 58.4 mL/min (by C-G formula based on Cr of 0.74).    Allergies  Allergen Reactions  . Erythromycin Ethylsuccinate   . Keflex [Cephalexin]   . Penicillins   . Sulfa Antibiotics     Antimicrobials this admission: 2/12 Vanc >>  2/12 Aztreonam >>  2/12 Acyclovir >> 2/12 Levaquin >>  Dose adjustments this admission: n/a  Microbiology results: 2/12 BCx:  2/12 UCx:   2/12 CSF:   2/12 MRSA PCR: Negative  Thank you for allowing pharmacy to be a part of  this patient's care.  Sherlon Handing, PharmD, BCPS Clinical pharmacist, pager [phone] 05/30/2015 7:24 AM

## 2015-05-30 NOTE — Progress Notes (Signed)
CRITICAL VALUE ALERT  Critical value received:  Lactic acid 3.7  Date of notification:  05/30/15  Time of notification:  0640  Critical value read back:Yes.    Nurse who received alert:  Beacher May, RN  MD notified (1st page):  Dr. Ashok Cordia  Time of first page:  [phone]  MD notified (2nd page):  Time of second page:  Responding MD:  Dr. Ashok Cordia  Time MD responded:  [phone]  MD aware. No new orders. Trending down.

## 2015-05-30 NOTE — Progress Notes (Signed)
Utilization review completed.  

## 2015-05-30 NOTE — Procedures (Signed)
Arterial Catheter Insertion Procedure Note Rocelyn Turso TK:[id number] 1956-02-16  Procedure: Insertion of Arterial Catheter  Indications: Blood pressure monitoring  Procedure Details Consent: Unable to obtain consent because of emergent medical necessity. Time Out: Verified patient identification, verified procedure, site/side was marked, verified correct patient position, special equipment/implants available, medications/allergies/relevent history reviewed, required imaging and test results available.  Performed  Maximum sterile technique was used including gloves. Skin prep: Chlorhexidine; local anesthetic administered 20 gauge catheter was inserted into right radial artery using the Seldinger technique.  Evaluation Blood flow good; BP tracing good. Complications: No apparent complications.    Pierre Bali 05/30/2015

## 2015-05-30 NOTE — Procedures (Signed)
Lumbar Puncture Procedure Note  Pre-operative Diagnosis: sepsis  Post-operative Diagnosis: sepsis  Indications: Diagnostic  Procedure Details   Consent: Informed consent was obtained. Risks of the procedure were discussed including: infection, bleeding, pain and headache.  The patient was positioned under sterile conditions. Betadine solution and sterile drapes were utilized. A spinal needle was inserted at the L2 - L3 interspace.  Spinal fluid was obtained and sent to the laboratory.  Findings 42mL of clear spinal fluid was obtained.   Complications:  None; patient tolerated the procedure well.        Condition: stable  Plan Bed rest for 1 hours. Tylenol 650 mg for pain. Follow up results.

## 2015-05-30 NOTE — Progress Notes (Signed)
Received verbal order to place temp foley.Spoke with MD about reduction of foley use and if it was medically necessary. MD confirmed that it was necessary for septic patient at this time.

## 2015-05-31 ENCOUNTER — Inpatient Hospital Stay (HOSPITAL_COMMUNITY): Payer: BC Managed Care – PPO

## 2015-05-31 ENCOUNTER — Telehealth: Payer: Self-pay | Admitting: Emergency Medicine

## 2015-05-31 DIAGNOSIS — R531 Weakness: Secondary | ICD-10-CM

## 2015-05-31 DIAGNOSIS — A400 Sepsis due to streptococcus, group A: Principal | ICD-10-CM

## 2015-05-31 DIAGNOSIS — R401 Stupor: Secondary | ICD-10-CM

## 2015-05-31 DIAGNOSIS — R4020 Unspecified coma: Secondary | ICD-10-CM

## 2015-05-31 DIAGNOSIS — R4182 Altered mental status, unspecified: Secondary | ICD-10-CM | POA: Insufficient documentation

## 2015-05-31 LAB — GLUCOSE, CAPILLARY
GLUCOSE-CAPILLARY: 121 mg/dL — AB (ref 65–99)
GLUCOSE-CAPILLARY: 131 mg/dL — AB (ref 65–99)
GLUCOSE-CAPILLARY: 147 mg/dL — AB (ref 65–99)
GLUCOSE-CAPILLARY: 178 mg/dL — AB (ref 65–99)
Glucose-Capillary: 161 mg/dL — ABNORMAL HIGH (ref 65–99)
Glucose-Capillary: 145 mg/dL — ABNORMAL HIGH (ref 65–99)

## 2015-05-31 LAB — POCT I-STAT 3, ART BLOOD GAS (G3+)
ACID-BASE DEFICIT: 13 mmol/L — AB (ref 0.0–2.0)
ACID-BASE DEFICIT: 9 mmol/L — AB (ref 0.0–2.0)
ACID-BASE DEFICIT: 5 mmol/L — AB (ref 0.0–2.0)
ACID-BASE DEFICIT: 7 mmol/L — AB (ref 0.0–2.0)
Acid-base deficit: 9 mmol/L — ABNORMAL HIGH (ref 0.0–2.0)
BICARBONATE: 15.8 meq/L — AB (ref 20.0–24.0)
BICARBONATE: 18.2 meq/L — AB (ref 20.0–24.0)
Bicarbonate: 14.9 mEq/L — ABNORMAL LOW (ref 20.0–24.0)
Bicarbonate: 15.8 mEq/L — ABNORMAL LOW (ref 20.0–24.0)
Bicarbonate: 16.1 mEq/L — ABNORMAL LOW (ref 20.0–24.0)
O2 SAT: 97 %
O2 SAT: 99 %
O2 SAT: 98 %
O2 Saturation: 89 %
O2 Saturation: 99 %
PH ART: 7.19 — AB (ref 7.350–7.450)
PH ART: 7.312 — AB (ref 7.350–7.450)
PO2 ART: 155 mmHg — AB (ref 80.0–100.0)
TCO2: 16 mmol/L (ref 0–100)
TCO2: 17 mmol/L (ref 0–100)
TCO2: 17 mmol/L (ref 0–100)
TCO2: 19 mmol/L (ref 0–100)
TCO2: 17 mmol/L (ref 0–100)
pCO2 arterial: 38.6 mmHg (ref 35.0–45.0)
pCO2 arterial: 32.5 mmHg — ABNORMAL LOW (ref 35.0–45.0)
pCO2 arterial: 31.3 mmHg — ABNORMAL LOW (ref 35.0–45.0)
pCO2 arterial: 25.5 mmHg — ABNORMAL LOW (ref 35.0–45.0)
pCO2 arterial: 25.3 mmHg — ABNORMAL LOW (ref 35.0–45.0)
pH, Arterial: 7.297 — ABNORMAL LOW (ref 7.350–7.450)
pH, Arterial: 7.456 — ABNORMAL HIGH (ref 7.350–7.450)
pH, Arterial: 7.411 (ref 7.350–7.450)
pO2, Arterial: 65 mmHg — ABNORMAL LOW (ref 80.0–100.0)
pO2, Arterial: 104 mmHg — ABNORMAL HIGH (ref 80.0–100.0)
pO2, Arterial: 146 mmHg — ABNORMAL HIGH (ref 80.0–100.0)
pO2, Arterial: 101 mmHg — ABNORMAL HIGH (ref 80.0–100.0)

## 2015-05-31 LAB — BASIC METABOLIC PANEL
ANION GAP: 13 (ref 5–15)
BUN: 30 mg/dL — ABNORMAL HIGH (ref 6–20)
CALCIUM: 7.7 mg/dL — AB (ref 8.9–10.3)
CO2: 17 mmol/L — ABNORMAL LOW (ref 22–32)
Chloride: 108 mmol/L (ref 101–111)
Creatinine, Ser: 0.71 mg/dL (ref 0.44–1.00)
Glucose, Bld: 147 mg/dL — ABNORMAL HIGH (ref 65–99)
POTASSIUM: 3.8 mmol/L (ref 3.5–5.1)
SODIUM: 138 mmol/L (ref 135–145)

## 2015-05-31 LAB — CBC
HCT: 37.8 % (ref 36.0–46.0)
Hemoglobin: 12.5 g/dL (ref 12.0–15.0)
MCH: 28.2 pg (ref 26.0–34.0)
MCHC: 33.1 g/dL (ref 30.0–36.0)
MCV: 85.1 fL (ref 78.0–100.0)
PLATELETS: 169 10*3/uL (ref 150–400)
RBC: 4.44 MIL/uL (ref 3.87–5.11)
RDW: 15.7 % — AB (ref 11.5–15.5)
WBC: 29.1 10*3/uL — AB (ref 4.0–10.5)

## 2015-05-31 LAB — URINE CULTURE: Culture: NO GROWTH

## 2015-05-31 LAB — LEGIONELLA ANTIGEN, URINE

## 2015-05-31 LAB — PROCALCITONIN: PROCALCITONIN: 5.55 ng/mL

## 2015-05-31 LAB — LACTIC ACID, PLASMA
LACTIC ACID, VENOUS: 3.3 mmol/L — AB (ref 0.5–2.0)
Lactic Acid, Venous: 2.9 mmol/L (ref 0.5–2.0)

## 2015-05-31 LAB — TSH: TSH: 0.272 u[IU]/mL — AB (ref 0.350–4.500)

## 2015-05-31 LAB — HIV ANTIBODY (ROUTINE TESTING W REFLEX): HIV Screen 4th Generation wRfx: NONREACTIVE

## 2015-05-31 MED ORDER — FENTANYL BOLUS VIA INFUSION
50.0000 ug | INTRAVENOUS | Status: DC | PRN
  Administered 2015-06-01: 50 ug via INTRAVENOUS
  Filled 2015-05-31: qty 50

## 2015-05-31 MED ORDER — PRO-STAT SUGAR FREE PO LIQD
30.0000 mL | Freq: Two times a day (BID) | ORAL | Status: AC
  Administered 2015-05-31 – 2015-06-01 (×2): 30 mL via ORAL
  Filled 2015-05-31 (×2): qty 30

## 2015-05-31 MED ORDER — VITAL HIGH PROTEIN PO LIQD: 1000.0000 mL | ORAL | Status: DC

## 2015-05-31 MED ORDER — FENTANYL CITRATE (PF) 100 MCG/2ML IJ SOLN: 50.0000 ug | INTRAMUSCULAR | Status: DC | PRN

## 2015-05-31 MED ORDER — VANCOMYCIN HCL IN DEXTROSE 1-5 GM/200ML-% IV SOLN
1000.0000 mg | Freq: Two times a day (BID) | INTRAVENOUS | Status: DC
  Administered 2015-06-01 (×3): 1000 mg via INTRAVENOUS
  Filled 2015-05-31 (×4): qty 200

## 2015-05-31 MED ORDER — VITAL AF 1.2 CAL PO LIQD
1000.0000 mL | ORAL | Status: DC
  Administered 2015-05-31 – 2015-06-15 (×14): 1000 mL
  Filled 2015-05-31 (×11): qty 1000

## 2015-05-31 MED ORDER — OSELTAMIVIR PHOSPHATE 6 MG/ML PO SUSR
150.0000 mg | Freq: Two times a day (BID) | ORAL | Status: AC
  Administered 2015-05-31 – 2015-06-06 (×14): 150 mg
  Filled 2015-05-31 (×14): qty 25

## 2015-05-31 MED ORDER — CLINDAMYCIN PHOSPHATE 600 MG/50ML IV SOLN
600.0000 mg | Freq: Three times a day (TID) | INTRAVENOUS | Status: AC
  Administered 2015-05-31 – 2015-06-01 (×5): 600 mg via INTRAVENOUS
  Filled 2015-05-31 (×5): qty 50

## 2015-05-31 MED ORDER — OSELTAMIVIR PHOSPHATE 6 MG/ML PO SUSR
150.0000 mg | Freq: Two times a day (BID) | ORAL | Status: DC
  Filled 2015-05-31: qty 25

## 2015-05-31 NOTE — Progress Notes (Signed)
Initial Nutrition Assessment  DOCUMENTATION CODES:   Not applicable  INTERVENTION:    Initiate TF via Cortrak tube with Vital AF 1.2 at 25 ml/h and Prostat 30 ml BID on day 1; on day 2, d/c Prostat and increase to goal rate of 55 ml/h (1320 ml per day) to provide 1584 kcals, 99 gm protein, 1071 ml free water daily.  NUTRITION DIAGNOSIS:   Inadequate oral intake related to inability to eat as evidenced by NPO status.  GOAL:   Patient will meet greater than or equal to 90% of their needs  MONITOR:   Vent status, Labs, Weight trends, TF tolerance, I & O's  REASON FOR ASSESSMENT:   Consult Enteral/tube feeding initiation and management  ASSESSMENT:   60 y/o woman with a history of Sjogren's disease, hypothyroidism and dysphagia. She was diagnosed with the flu (A) about 4 days ago and started on tamiflu. According to her husband, she had actually been improving the 24 hours prior to admission with fever resolving and improvement in her nausea and vomiting. He reports that the evening of admission she developed weakness and altered mental status. She was brought to [facility] ED via ambulance.  Received MD Consult for TF initiation and management. Cortrak tube placed today with tip at the duodenum. OGT was unable to be placed on admission. Nutrition focused physical exam completed.  No muscle or subcutaneous fat depletion noticed. Labs reviewed.  Patient is currently intubated on ventilator support MV: 15.1 L/min Temp (24hrs), Avg:97.9 F (36.6 C), Min:97 F (36.1 C), Max:99.1 F (37.3 C)   Diet Order:  Diet NPO time specified  Skin:  Reviewed, no issues  Last BM:  PTA  Height:   Ht Readings from Last 1 Encounters:  05/30/15 5\' 2"  (1.575 m)    Weight:   Wt Readings from Last 1 Encounters:  05/31/15 159 lb 6.3 oz (72.3 kg)  Admit weight (2/12)  148 lb 5.9 oz (67.3 kg)  Ideal Body Weight:  50 kg  BMI:  Body mass index is 29.15 kg/(m^2). BMI=27.1 using admit  weight  Estimated Nutritional Needs:   Kcal:  1633  Protein:  95-110 gm  Fluid:  1.6-1.8 L  EDUCATION NEEDS:   No education needs identified at this time  Molli Barrows, [place], [place], [place] Pager [phone] After Hours Pager [phone]

## 2015-05-31 NOTE — Progress Notes (Signed)
Pharmacy Antibiotic Note  [name] [name] is a 60 y.o. female admitted on 05/29/2015 with bacteremia.  Pharmacy has been consulted for vancomycin, levaquin, and clindamycin dosing.  Plan: The dose of vancomycin 500 mg IV q24h will be adjusted to 1,000 mg IV q24h based on renal function.  Continue levaquin 750 mg IV q24h Continue Tamiflu 150 mg PO BID Start clindamycin 600 mg IV q8h (5 days LOT)  Height: 5\' 2"  (157.5 cm) Weight: 159 lb 6.3 oz (72.3 kg) IBW/kg (Calculated) : 50.1  Temp (24hrs), Avg:97.6 F (36.4 C), Min:96.6 F (35.9 C), Max:99.1 F (37.3 C)   Recent Labs Lab 05/30/15 0014 05/30/15 0202 05/30/15 0500 05/30/15 0520 05/30/15 1059 05/30/15 1100 05/30/15 1649 05/31/15 0808 05/31/15 1210  WBC  --   --  13.8*  --   --   --   --  29.1*  --   CREATININE 1.10* 0.84  --  0.74 0.77  --   --  0.71  --   LATICACIDVEN 8.08*  --   --  3.7*  --  4.3* 4.9* 3.3* 2.9*    Estimated Creatinine Clearance: 68.8 mL/min (by C-G formula based on Cr of 0.71).    Allergies  Allergen Reactions  . Erythromycin Ethylsuccinate Hives  . Keflex [Cephalexin] Hives  . Penicillins   . Sulfa Antibiotics     Antimicrobials this admission: Tamiflu 2/12 >> Vanc 2/12 >>  Clindamycin 2/13 >> (needs 5 days only) Levaquin 2/12 >>  Aztreonam 2/12 >> 2/13 Acyclovir 2/12 >> 2/12  Microbiology results: 2/12 BCx: GAS 2/12 UCx:  2/12 CSF: ngtd 2/12 MRSA PCR: Negative  Thank you for allowing pharmacy to be a part of this patient's care.  Jazz Biddy L. Nicole Kindred, PharmD PGY2 Infectious Diseases Pharmacy Resident Pager: [phone] 05/31/2015 1:43 PM

## 2015-05-31 NOTE — Progress Notes (Signed)
Interval History:                                                                                                                      [name] [name] is an 60 y.o. female patient who is admitted to the ICU with respiratory failure, pneumonia. She is currently sedated, intubated and paralyzed in ICU. Husband noted bedside.   EEG was done today, did not show any abnormal effect from activity. CT of the head done at admission did not show any acute pathology. MRI of the brain is still pending until the patient is stable for the study.   Lumbar puncture was completed, CSF study showed white cell count of 2, protein normal, CSF Gram stain negative.  Blood cultures sent, gram-positive cocci in chains. final results are pending. Patient also noted to have positive flu test, 1 day prior to admission.   Past Medical History: Past Medical History  Diagnosis Date  . Hypothyroidism   . Seizures ([place])   . Adenopathy   . Scoliosis   . Spleen anomaly   . Anemia   . Sjogren's disease ([place])   . Raynaud disease   . Shingles   . Osteopenia     Past Surgical History  Procedure Laterality Date  . Tonsillectomy    . Breast cyst aspiration Right 1990's    neg    Family History: Family History  Problem Relation Age of Onset  . Breast cancer Maternal Grandfather 6    Social History:   reports that she has never smoked. She has never used smokeless tobacco. She reports that she does not drink alcohol or use illicit drugs.  Allergies:  Allergies  Allergen Reactions  . Erythromycin Ethylsuccinate Hives  . Keflex [Cephalexin] Hives  . Penicillins   . Sulfa Antibiotics      Medications:                                                                                                                         Current facility-administered medications:  .  0.9 %  sodium chloride infusion, 250 mL, Intravenous, PRN, Guy Begin, MD, Last Rate: 10 mL/hr at 05/31/15 0800, 250 mL at 05/31/15  0800 .  0.9 %  sodium chloride infusion, , Intravenous, Continuous, Katheren Shams, DO, Last Rate: 10 mL/hr at 05/31/15 0819 .  acetaminophen (TYLENOL) tablet 650 mg, 650 mg, Oral, Q4H PRN, Guy Begin, MD .  albuterol (PROVENTIL) (2.5 MG/3ML) 0.083% nebulizer solution  2.5 mg, 2.5 mg, Nebulization, Q4H, Guy Begin, MD, 2.5 mg at 05/31/15 1517 .  antiseptic oral rinse solution (CORINZ), 7 mL, Mouth Rinse, 10 times per day, Guy Begin, MD, 7 mL at 05/31/15 1824 .  artificial tears (LACRILUBE) ophthalmic ointment 1 application, 1 application, Both Eyes, 3 times per day, Raylene Miyamoto, MD, 1 application at 0000000 [phone] .  chlorhexidine gluconate (PERIDEX) 0.12 % solution 15 mL, 15 mL, Mouth Rinse, BID, Guy Begin, MD, 15 mL at 05/31/15 0835 .  cisatracurium (NIMBEX) 200 mg in sodium chloride 0.9 % 200 mL (1 mg/mL) infusion, 3-10 mcg/kg/min, Intravenous, Titrated, Raylene Miyamoto, MD, Last Rate: 2 mL/hr at 05/31/15 0841, 0.5 mcg/kg/min at 05/31/15 0841 .  clindamycin (CLEOCIN) IVPB 600 mg, 600 mg, Intravenous, 3 times per day, Raylene Miyamoto, MD, 600 mg at 05/31/15 1506 .  famotidine (PEPCID) IVPB 20 mg premix, 20 mg, Intravenous, Q12H, Guy Begin, MD, 20 mg at 05/31/15 0949 .  feeding supplement (PRO-STAT SUGAR FREE 64) liquid 30 mL, 30 mL, Oral, BID, Raylene Miyamoto, MD, 30 mL at 05/31/15 1623 .  feeding supplement (VITAL AF 1.2 CAL) liquid 1,000 mL, 1,000 mL, Per Tube, Continuous, Raylene Miyamoto, MD, Last Rate: 25 mL/hr at 05/31/15 1623, 1,000 mL at 05/31/15 1623 .  fentaNYL (SUBLIMAZE) 2,500 mcg in sodium chloride 0.9 % 250 mL (10 mcg/mL) infusion, 25-300 mcg/hr, Intravenous, Continuous, Raylene Miyamoto, MD, Last Rate: 25 mL/hr at 05/31/15 1509, 250 mcg/hr at 05/31/15 1509 .  fentaNYL (SUBLIMAZE) bolus via infusion 50 mcg, 50 mcg, Intravenous, Q30 min PRN, Katheren Shams, DO .  fentaNYL (SUBLIMAZE) injection 100 mcg, 100 mcg, Intravenous, Once  PRN, Raylene Miyamoto, MD .  heparin injection 5,000 Units, 5,000 Units, Subcutaneous, 3 times per day, Guy Begin, MD, 5,000 Units at 05/31/15 1505 .  levofloxacin (LEVAQUIN) IVPB 750 mg, 750 mg, Intravenous, Q24H, Franky Macho, RPH, 750 mg at 05/30/15 2300 .  levothyroxine (SYNTHROID, LEVOTHROID) injection 50 mcg, 50 mcg, Intravenous, Daily, Guy Begin, MD, 50 mcg at 05/31/15 [phone] .  midazolam (VERSED) 50 mg in sodium chloride 0.9 % 50 mL (1 mg/mL) infusion, 1-10 mg/hr, Intravenous, Continuous, Raylene Miyamoto, MD, Last Rate: 5 mL/hr at 05/31/15 1509, 5 mg/hr at 05/31/15 1509 .  midazolam (VERSED) bolus via infusion 1-2 mg, 1-2 mg, Intravenous, Q2H PRN, Raylene Miyamoto, MD .  midazolam (VERSED) bolus via infusion 2 mg, 2 mg, Intravenous, Q30 min PRN, Raylene Miyamoto, MD .  midazolam (VERSED) injection 2 mg, 2 mg, Intravenous, Once PRN, Raylene Miyamoto, MD .  oseltamivir (TAMIFLU) 6 MG/ML suspension 150 mg, 150 mg, Per Tube, BID, Raylene Miyamoto, MD, 150 mg at 05/31/15 1624 .  phenylephrine (NEO-SYNEPHRINE) 40 mg in sodium chloride 0.9 % 250 mL (0.16 mg/mL) infusion, 0-400 mcg/min, Intravenous, Titrated, Raylene Miyamoto, MD, Stopped at 05/31/15 1510 .  [START ON 06/01/2015] vancomycin (VANCOCIN) IVPB 1000 mg/200 mL premix, 1,000 mg, Intravenous, Q12H, Raylene Miyamoto, MD   Neurologic Examination:  Blood pressure 97/65, pulse 88, temperature 96.4 F (35.8 C), temperature source Core (Comment), resp. rate 35, height 5\' 2"  (1.575 m), weight 72.3 kg (159 lb 6.3 oz), SpO2 99 %. Patient sedated, intubated, paralyzed with Nimbex. No specific gaze deviation. No abnormal involuntary movements or motor response to stimulation due to Nimbex.    Lab Results: Basic Metabolic Panel:  Recent Labs Lab 05/30/15 0014  05/30/15 0202 05/30/15 0520 05/30/15 1059  05/30/15 1328 05/31/15 0808  NA 124*  --  129* 132* 132*  --  138  K 5.5*  --  4.4 4.4 4.6  --  3.8  CL 93*  --  101 101 104  --  108  CO2  --   --  15* 19* 16*  --  17*  GLUCOSE 137*  --  150* 164* 186*  --  147*  BUN 71*  --  39* 34* 32*  --  30*  CREATININE 1.10*  --  0.84 0.74 0.77  --  0.71  CALCIUM  --   < > 6.9* 7.4* 7.1*  --  7.7*  MG  --   --   --   --   --  2.3  --   PHOS  --   --   --   --   --  2.5  --   < > = values in this interval not displayed.  Liver Function Tests:  Recent Labs Lab 05/30/15 0202  AST 84*  ALT 38  ALKPHOS 43  BILITOT 1.0  PROT 3.8*  ALBUMIN 1.8*   No results for input(s): LIPASE, AMYLASE in the last 168 hours. No results for input(s): AMMONIA in the last 168 hours.  CBC:  Recent Labs Lab 05/30/15 0014 05/30/15 0500 05/31/15 0808  WBC  --  13.8* 29.1*  NEUTROABS  --  12.7*  --   HGB 16.7* 13.6 12.5  HCT 49.0* 40.9 37.8  MCV  --  85.9 85.1  PLT  --  266 169    Cardiac Enzymes: No results for input(s): CKTOTAL, CKMB, CKMBINDEX, TROPONINI in the last 168 hours.  Lipid Panel: No results for input(s): CHOL, TRIG, HDL, CHOLHDL, VLDL, LDLCALC in the last 168 hours.  CBG:  Recent Labs Lab 05/31/15 0016 05/31/15 0424 05/31/15 0739 05/31/15 1152 05/31/15 1519  GLUCAP 147* 161* 121* 131* 145*    Microbiology: Results for orders placed or performed during the hospital encounter of 05/29/15  Blood culture (routine x 2)     Status: None (Preliminary result)   Collection Time: 05/30/15 12:02 AM  Result Value Ref Range Status   Specimen Description BLOOD LEFT ANTECUBITAL  Final   Special Requests BOTTLES DRAWN AEROBIC AND ANAEROBIC 5CCS  Final   Culture  Setup Time   Final    GRAM POSITIVE COCCI IN CHAINS IN BOTH AEROBIC AND ANAEROBIC BOTTLES CRITICAL RESULT CALLED TO, READ BACK BY AND VERIFIED WITH: T BLACKBURN,RN AT 1214 05/30/15 BY L BENFIELD    Culture   Final    GROUP A STREP (S.PYOGENES) ISOLATED SUSCEPTIBILITIES TO  FOLLOW HEALTH DEPARTMENT NOTIFIED    Report Status PENDING  Incomplete  Blood culture (routine x 2)     Status: None (Preliminary result)   Collection Time: 05/30/15 12:53 AM  Result Value Ref Range Status   Specimen Description BLOOD RIGHT HAND  Final   Special Requests BOTTLES DRAWN AEROBIC ONLY 5CCS  Final   Culture  Setup Time   Final    GRAM POSITIVE COCCI IN  CHAINS AEROBIC BOTTLE ONLY CRITICAL RESULT CALLED TO, READ BACK BY AND VERIFIED WITH: T BLACKBURN,RN AT 1301 05/30/15 BY L BENFIELD    Culture   Final    GROUP A STREP (S.PYOGENES) ISOLATED HEALTH DEPARTMENT NOTIFIED    Report Status PENDING  Incomplete  MRSA PCR Screening     Status: None   Collection Time: 05/30/15  4:20 AM  Result Value Ref Range Status   MRSA by PCR NEGATIVE NEGATIVE Final    Comment:        The GeneXpert MRSA Assay (FDA approved for NASAL specimens only), is one component of a comprehensive MRSA colonization surveillance program. It is not intended to diagnose MRSA infection nor to guide or monitor treatment for MRSA infections.   Urine culture     Status: None   Collection Time: 05/30/15  5:58 AM  Result Value Ref Range Status   Specimen Description URINE, CATHETERIZED  Final   Special Requests NONE  Final   Culture NO GROWTH 1 DAY  Final   Report Status 05/31/2015 FINAL  Final  CSF culture     Status: None (Preliminary result)   Collection Time: 05/30/15  6:38 AM  Result Value Ref Range Status   Specimen Description CSF  Final   Special Requests NONE  Final   Gram Stain   Final    WBC PRESENT, PREDOMINANTLY MONONUCLEAR NO ORGANISMS SEEN CYTOSPIN SMEAR    Culture NO GROWTH 1 DAY  Final   Report Status PENDING  Incomplete    Imaging: Ct Head Wo Contrast  05/30/2015  CLINICAL DATA:  Acute onset of right-sided facial droop and altered mental status. Right arm weakness. Initial encounter. EXAM: CT HEAD WITHOUT CONTRAST TECHNIQUE: Contiguous axial images were obtained from the base  of the skull through the vertex without intravenous contrast. COMPARISON:  None. FINDINGS: There is no evidence of acute infarction, mass lesion, or intra- or extra-axial hemorrhage on CT. The posterior fossa, including the cerebellum, brainstem and fourth ventricle, is within normal limits. The third and lateral ventricles, and basal ganglia are unremarkable in appearance. The cerebral hemispheres are symmetric in appearance, with normal gray-white differentiation. No mass effect or midline shift is seen. There is no evidence of fracture; visualized osseous structures are unremarkable in appearance. The visualized portions of the orbits are within normal limits. Mild mucosal thickening is noted at the maxillary sinuses bilaterally and at the ethmoid air cells. The remaining paranasal sinuses and mastoid air cells are well-aerated. No significant soft tissue abnormalities are seen. IMPRESSION: 1. No definite acute intracranial pathology seen on CT. 2. Mild mucosal thickening at the maxillary sinuses bilaterally. Electronically Signed   By: Garald Balding M.D.   On: 05/30/2015 03:23   Ct Angio Chest Pe W/cm &/or Wo Cm  05/30/2015  CLINICAL DATA:  60 year old female with hypoxia EXAM: CT ANGIOGRAPHY CHEST WITH CONTRAST TECHNIQUE: Multidetector CT imaging of the chest was performed using the standard protocol during bolus administration of intravenous contrast. Multiplanar CT image reconstructions and MIPs were obtained to evaluate the vascular anatomy. CONTRAST:  174mL OMNIPAQUE IOHEXOL 350 MG/ML SOLN COMPARISON:  Radiograph dated 05/30/2015 FINDINGS: There are large consolidative changes with air bronchogram involving the right upper lobe, right middle lobe, and right lower lobe. Scattered ground-glass and nodular density noted in the right apical region. There is a patchy area of consolidative change with air bronchogram in the left lower lobe with scattered ground-glass and nodular airspace opacity in the left  lower lobe and lingula.  No pleural effusion or pneumothorax. An endotracheal tube is noted with tip above the carina. The central airways are patent. The thoracic aorta appears unremarkable. There is mild prominence of the pulmonary trunk concerning for a degree of pulmonary hypertension. There is no CT evidence of pulmonary embolism. There bilateral hilar adenopathy. There is no cardiomegaly or pericardial effusion. The esophagus is grossly unremarkable. The chest wall soft tissues appear unremarkable. The osseous structures are intact. A paddle megaly. The visualized upper abdomen is otherwise grossly unremarkable. Review of the MIP images confirms the above findings. IMPRESSION: No CT evidence of pulmonary embolism. Extensive bilateral consolidative changes and scattered ground-glass pulmonary nodules compatible with pneumonia. Clinical correlation and follow-up resolution recommended Electronically Signed   By: Anner Crete M.D.   On: 05/30/2015 03:26   Dg Chest Port 1 View  05/30/2015  CLINICAL DATA:  Central line placement.  Initial encounter. EXAM: PORTABLE CHEST 1 VIEW COMPARISON:  Chest radiograph and CTA of the chest performed earlier today at 3:01 a.m. FINDINGS: A right subclavian line is noted ending about the cavoatrial junction. The patient's endotracheal tube is seen ending 2-3 cm above the carina. Dense right-sided and mild left basilar airspace opacities are again noted, compatible with multifocal pneumonia. No pleural effusion or pneumothorax is seen. The cardiomediastinal silhouette is normal in size. No acute osseous abnormalities are identified. IMPRESSION: 1. Right subclavian line noted ending about the cavoatrial junction. 2. Endotracheal tube seen ending 2-3 cm above the carina. 3. Bilateral pneumonia, worse on the right. Electronically Signed   By: Garald Balding M.D.   On: 05/30/2015 05:35   Dg Chest Port 1 View  05/30/2015  CLINICAL DATA:  Status post endotracheal tube placement.  Initial encounter. EXAM: PORTABLE CHEST 1 VIEW COMPARISON:  Chest radiograph performed earlier today at 12:09 a.m. FINDINGS: The patient's endotracheal tube is seen ending 3-4 cm above the carina. Worsening right-sided pneumonia is noted. Mild patchy left basilar airspace opacity is also seen. No pleural effusion or pneumothorax is identified. The cardiomediastinal silhouette is borderline normal in size. No acute osseous abnormalities are identified. IMPRESSION: 1. Endotracheal tube seen ending 3-4 cm above the carina. 2. Worsening right-sided pneumonia noted. Mild patchy left basilar airspace opacity also seen. Electronically Signed   By: Garald Balding M.D.   On: 05/30/2015 02:13   Dg Chest Port 1 View  05/30/2015  CLINICAL DATA:  60 year old female with hypoxia. EXAM: PORTABLE CHEST 1 VIEW COMPARISON:  CT dated 08/11/2005 FINDINGS: There is a large area of consolidative change involving right mid and lower lung field with air bronchograms compatible with pneumonia. Underlying mass is not excluded. Clinical correlation and follow-up resolution is recommended. The left lung is clear. No pleural effusion or pneumothorax. Cardiac silhouette is within normal limits. The osseous structures are unremarkable. IMPRESSION: Large right lung consolidative changes as described. Clinical correlation and follow-up to resolution recommended. Electronically Signed   By: Anner Crete M.D.   On: 05/30/2015 00:26   Dg Abd Portable 1v  05/31/2015  CLINICAL DATA:  Feeding tube placement EXAM: PORTABLE ABDOMEN - 1 VIEW COMPARISON:  None. FINDINGS: Feeding tube with the tip projecting over the proximal duodenum. There is no bowel dilatation to suggest obstruction. There is no evidence of pneumoperitoneum, portal venous gas or pneumatosis. There are no pathologic calcifications along the expected course of the ureters. The osseous structures are unremarkable. IMPRESSION: Feeding tube with the tip projecting over the proximal  duodenum. Electronically Signed   By: Kathreen Devoid  On: 05/31/2015 13:08    Assessment and plan:   Tamatha Murthy is an 60 y.o. female patient admitted with possible sepsis, with preliminary blood cultures showing gram-positive cocci, with known pneumonia on chest x-ray, positive flu test, presented to the ER in respiratory failure as described in my consultation note. From neurological standpoint, workup with EEG, CT brain and CSF studies have been negative so far. MRI of the brain is recommended, still pending and the patient is stable for the study. Currently she is sedated, intubated and paralyzed with Nimbex with limited neurological examination.  We'll follow-up after completing the brain MRI study. Will consider repeating the EEG when she is off of the sedation. Please call if any further questions.

## 2015-05-31 NOTE — Progress Notes (Signed)
CRITICAL VALUE ALERT  Critical value received:  LA 2.9  Date of notification:  05/31/15  Time of notification:  [id number]  Critical value read back:Yes.    Nurse who received alert:  West Carbo, RN  MD notified (1st page):  Dr. Titus Mould  Time of first page:  54  MD notified (2nd page):  Time of second page:  Responding MD:  Dr. Titus Mould   Time MD responded:1210

## 2015-05-31 NOTE — Progress Notes (Signed)
CRITICAL VALUE ALERT  Critical value received: LA 3.3  Date of notification:  05/31/15  Time of notification:  0808  Critical value read back:Yes.    Nurse who received alert:  West Carbo, RN   MD notified (1st page):  Dr. Titus Mould  Time of first page:  0808  MD notified (2nd page):  Time of second page:  Responding MD:  Dr. Titus Mould   Time MD responded:  [phone]

## 2015-05-31 NOTE — Progress Notes (Signed)
Bedside EEG completed, results pending. 

## 2015-05-31 NOTE — Care Management Note (Addendum)
Case Management Note  Patient Details  Name: [name] [name] MRN: [id number] Date of Birth: 09/21/1955  Subjective/Objective:   Pt admitted with sepsis, positive for flu and had to be intubated             Action/Plan:  Pt is independent from home with husband [name], pt is a  Oncologist.  CM will continue to monitor for disposition needs   Expected Discharge Date:                  Expected Discharge Plan:  Home/Self Care  In-House Referral:     Discharge planning Services  CM Consult  Post Acute Care Choice:    Choice offered to:     DME Arranged:    DME Agency:     HH Arranged:    HH Agency:     Status of Service:  In process, will continue to follow  Medicare Important Message Given:    Date Medicare IM Given:    Medicare IM give by:    Date Additional Medicare IM Given:    Additional Medicare Important Message give by:     If discussed at [place] of Stay Meetings, dates discussed:    Additional Comments:  [name], RN 05/31/2015, 1:57 PM

## 2015-05-31 NOTE — Progress Notes (Signed)
Name:[name] [name] DY:[id number] DOB:[birth year]   ADMISSION DATE: 05/29/2015 CONSULTATION DATE: 05/30/15  REFERRING MD: Dr. Leonides Schanz  CHIEF COMPLAINT: Hypoxic respiratory failure  HISTORY OF PRESENT ILLNESS:  Ms/ [name] is a 60 y/o woman with a history of Sjogren's disease, hypothyroidism and dysphagia. She was diagnosed with the flu (A) about 4 days ago and started on tamiflu. According to her husband, she had actually been improving the 24 hours prior to admission with fever resolving and improvement in her nausea and vomiting. He reports that the evening of admission she developed weakness and altered mental status. She was brought to [facility] ED via ambulance. She was initially thought to be a code stroke with a right sided facial droop and flaccid paralysis of her right arm. Of note she does have a history of seizure but is not on antiepileptics and had no witnessed seizure activity. In the ED she was found to be in respiratory distress and acutely hypoxic on a non-rebreather. A chest x-ray showed a dense right sided infiltrate with volume loss. She was also noted to have a diffuse red papular rash. She is on plaquenil for her Sjogren's disease but is not on any other immunosuppressive agents. 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   SUBJECTIVE:  61 y/o woman with influenza and right sided pna, currently with svt on levo  BP 89/61 mmHg  Pulse 116  Temp(Src) 99.1 F (37.3 C) (Core (Comment))  Resp 35  Ht 5\' 2"  (1.575 m)  Wt 159 lb 6.3 oz (72.3 kg)  BMI 29.15 kg/m2  SpO2 100% CVP:  [10 mmHg-20 mmHg] 16 mmHg   Recent Labs Lab 05/30/15 0202 05/30/15 0520 05/30/15 1059  NA 129* 132* 132*  K 4.4 4.4 4.6  CL 101 101 104  CO2 15* 19* 16*  BUN 39* 34* 32*  CREATININE 0.84 0.74 0.77  GLUCOSE 150* 164* 186*    Recent Labs Lab 05/30/15 0014 05/30/15 0500  HGB 16.7* 13.6  HCT 49.0* 40.9  WBC  --  13.8*  PLT  --  266   CBG  (last 3)   Recent Labs  05/30/15 2001 05/31/15 0016 05/31/15 0424  GLUCAP 167* 147* 161*    ABG    Component Value Date/Time   PHART 7.297* 05/31/2015 0229   PCO2ART 32.5* 05/31/2015 0229   PO2ART 155.0* 05/31/2015 0229   HCO3 15.8* 05/31/2015 0229   TCO2 17 05/31/2015 0229   ACIDBASEDEF 9.0* 05/31/2015 0229   O2SAT 99.0 05/31/2015 0229    Imaging Ct Head Wo Contrast  05/30/2015  CLINICAL DATA:  Acute onset of right-sided facial droop and altered mental status. Right arm weakness. Initial encounter. EXAM: CT HEAD WITHOUT CONTRAST TECHNIQUE: Contiguous axial images were obtained from the base of the skull through the vertex without intravenous contrast. COMPARISON:  None. FINDINGS: There is no evidence of acute infarction, mass lesion, or intra- or extra-axial hemorrhage on CT. The posterior fossa, including the cerebellum, brainstem and fourth ventricle, is within normal limits. The third and lateral ventricles, and basal ganglia are unremarkable in appearance. The cerebral hemispheres are symmetric in appearance, with normal gray-white differentiation. No mass effect or midline shift is seen. There is no evidence of fracture; visualized osseous structures are unremarkable in appearance. The visualized portions of the orbits are within normal limits. Mild mucosal thickening is noted at the maxillary sinuses bilaterally and at the ethmoid air cells. The remaining paranasal sinuses and mastoid air cells are well-aerated. No significant soft  tissue abnormalities are seen. IMPRESSION: 1. No definite acute intracranial pathology seen on CT. 2. Mild mucosal thickening at the maxillary sinuses bilaterally. Electronically Signed   By: Garald Balding M.D.   On: 05/30/2015 03:23   Ct Angio Chest Pe W/cm &/or Wo Cm  05/30/2015  CLINICAL DATA:  60 year old female with hypoxia EXAM: CT ANGIOGRAPHY CHEST WITH CONTRAST TECHNIQUE: Multidetector CT imaging of the chest was performed using the standard  protocol during bolus administration of intravenous contrast. Multiplanar CT image reconstructions and MIPs were obtained to evaluate the vascular anatomy. CONTRAST:  159mL OMNIPAQUE IOHEXOL 350 MG/ML SOLN COMPARISON:  Radiograph dated 05/30/2015 FINDINGS: There are large consolidative changes with air bronchogram involving the right upper lobe, right middle lobe, and right lower lobe. Scattered ground-glass and nodular density noted in the right apical region. There is a patchy area of consolidative change with air bronchogram in the left lower lobe with scattered ground-glass and nodular airspace opacity in the left lower lobe and lingula. No pleural effusion or pneumothorax. An endotracheal tube is noted with tip above the carina. The central airways are patent. The thoracic aorta appears unremarkable. There is mild prominence of the pulmonary trunk concerning for a degree of pulmonary hypertension. There is no CT evidence of pulmonary embolism. There bilateral hilar adenopathy. There is no cardiomegaly or pericardial effusion. The esophagus is grossly unremarkable. The chest wall soft tissues appear unremarkable. The osseous structures are intact. A paddle megaly. The visualized upper abdomen is otherwise grossly unremarkable. Review of the MIP images confirms the above findings. IMPRESSION: No CT evidence of pulmonary embolism. Extensive bilateral consolidative changes and scattered ground-glass pulmonary nodules compatible with pneumonia. Clinical correlation and follow-up resolution recommended Electronically Signed   By: Anner Crete M.D.   On: 05/30/2015 03:26   Dg Chest Port 1 View  05/30/2015  CLINICAL DATA:  Central line placement.  Initial encounter. EXAM: PORTABLE CHEST 1 VIEW COMPARISON:  Chest radiograph and CTA of the chest performed earlier today at 3:01 a.m. FINDINGS: A right subclavian line is noted ending about the cavoatrial junction. The patient's endotracheal tube is seen ending 2-3 cm  above the carina. Dense right-sided and mild left basilar airspace opacities are again noted, compatible with multifocal pneumonia. No pleural effusion or pneumothorax is seen. The cardiomediastinal silhouette is normal in size. No acute osseous abnormalities are identified. IMPRESSION: 1. Right subclavian line noted ending about the cavoatrial junction. 2. Endotracheal tube seen ending 2-3 cm above the carina. 3. Bilateral pneumonia, worse on the right. Electronically Signed   By: Garald Balding M.D.   On: 05/30/2015 05:35   Dg Chest Port 1 View  05/30/2015  CLINICAL DATA:  Status post endotracheal tube placement. Initial encounter. EXAM: PORTABLE CHEST 1 VIEW COMPARISON:  Chest radiograph performed earlier today at 12:09 a.m. FINDINGS: The patient's endotracheal tube is seen ending 3-4 cm above the carina. Worsening right-sided pneumonia is noted. Mild patchy left basilar airspace opacity is also seen. No pleural effusion or pneumothorax is identified. The cardiomediastinal silhouette is borderline normal in size. No acute osseous abnormalities are identified. IMPRESSION: 1. Endotracheal tube seen ending 3-4 cm above the carina. 2. Worsening right-sided pneumonia noted. Mild patchy left basilar airspace opacity also seen. Electronically Signed   By: Garald Balding M.D.   On: 05/30/2015 02:13   Dg Chest Port 1 View  05/30/2015  CLINICAL DATA:  60 year old female with hypoxia. EXAM: PORTABLE CHEST 1 VIEW COMPARISON:  CT dated 08/11/2005 FINDINGS: There is a large  area of consolidative change involving right mid and lower lung field with air bronchograms compatible with pneumonia. Underlying mass is not excluded. Clinical correlation and follow-up resolution is recommended. The left lung is clear. No pleural effusion or pneumothorax. Cardiac silhouette is within normal limits. The osseous structures are unremarkable. IMPRESSION: Large right lung consolidative changes as described. Clinical correlation and  follow-up to resolution recommended. Electronically Signed   By: Anner Crete M.D.   On: 05/30/2015 00:26   STUDIES:  Head CT 2/12 >> no acute hemorrhagic infarct.  Chest CT 2/12 >> ground glass opacities, consolidation c/w pneumonia.   CULTURES: Blood 2/12>>GPC chains!!! TTP 12 hrs >> Urine 2/12>> pending  ANTIBIOTICS: Vanc, aztreonam,  Acyclovir>>>2/12 levaquin 2/12->>  SIGNIFICANT EVENTS: Intubated in ED Shock Refractory hypoxia  LINES/TUBES: Right subclavian CVC 2/12>> Foley 2/12>> ETT 2/12>> A line 2/12>>>  DISCUSSION: 60 y/o with influenza A and right sided pneumonia in significant respiratory distress, intubated for hypoxic respiratory failure.    General: paralzyed, sedated deep prior Neuro: paralyzed, perr HEENT:jvd  PULM: coarse left CV: s1 s2 RRT GI: soft, bs wnl, no r/g Extremities: no edema    ASSESSMENT / PLAN:  PULMONARY A: Pneumonia ARDS NOt present as unilateral Dense infiltrate P:  Lung protective ventilation Left lung down Avoid auto peep  CTA no evidence of PE.  Antibiotic for CAP Peep 15, fio2 likely can reduce Titrate FIO2 as able.   CARDIOVASCULAR A:  Septic shock SVT resolved from levophed adequate cortisol response P:  Fluid resuscitate over Check cvp Neo to maintain MAPS>65, 2/12 dc levo for svt A-fib - monitor Lopressor prn.  Ensure adequate sedation  RENAL A:  AKI. Hyponatremia  Recent Labs Lab 05/30/15 0202 05/30/15 0520 05/30/15 1059  NA 129* 132* 132*    P:  Improved with fluids Na 125 on admission, 132 after fluids. almost 6 liter sup last 24 hr- kvo Repeat BMP this am.  Chem awaited  GASTROINTESTINAL A:  Question of esophageal stricture, sjorgens P:  Pt planned for EGD in the fall, not clear from medical record if this was dose. Of note - OG tube could not be passed in either ED or ICU. Consider GI consult  Add D5 to fluids if still unable to get G tube.  NO TPN Re  attempt with cortrak  INFECTIOUS A:  Pneumonia,  Meningitis ruled out.  Septic shock P:  Vanc, aztreonam, levaquin remain await sens / ID Blood cx with GPC's Chains TTP 12 hrs.  Repeat Cx if worsening.  LP analysis pending - unremarkable. D/C'd Acyclovir.   Endocrine: A: Hypothyroidism P: IV synthroid ordered until enteral access obtained.  Obtain tsh  NEUROLOGIC A:  Severe hypoxia requiring paralysis On presentation weakness P:  RASS goal: -5 with paralysis  Paralysis on admission Todd's paralysis? Monitor for seizure activity Head CT with no acute findings.  Too sick to travel MRI  FAMILY  - Updates: husband daily  - Inter-disciplinary family meet or Palliative Care meeting due by: day [place], MD Family Medicine - PGY 2  STAFF NOTE: I, Merrie Roof, MD FACP have personally reviewed patient's available data, including medical history, events of note, physical examination and test results as part of my evaluation. I have discussed with resident/NP and other care providers such as pharmacist, RN and RRT. In addition, I personally evaluated patient and elicited key findings of: paralyzed sedated, ronchi rt, neo low dose, ABG reviewed, keep max MV, if ph  less 7.20 would consider bicarb, keep plat less 30 , NO ARDS as of now, lef tside down as able, likely can reduce fio2/ peep to goal 60% peep 10 today, pa o2 may allow, may need to prone to improve O2 needs, maintain paralysis, bis 50 , need to reduce as tachy also, neo, if to 200 add vaso, cortisol noted, no role steroids stress, cvp accuracy>?, kvo for now as BP improved, await chem this am , avoid levo as able as SVT with, attempt cotrak but may need egd to place ngt, await ID pairs, keep current regimen, add tamiflu if OGT obtained, NO TPN, left lung down as able to improve VQ matching The patient is critically ill with multiple organ systems failure and requires high complexity decision  making for assessment and support, frequent evaluation and titration of therapies, application of advanced monitoring technologies and extensive interpretation of multiple databases.   Critical Care Time devoted to patient care services described in this note is 35 Minutes. This time reflects time of care of this signee: Merrie Roof, MD FACP. This critical care time does not reflect procedure time, or teaching time or supervisory time of PA/NP/Med student/Med Resident etc but could involve care discussion time. Rest per NP/medical resident whose note is outlined above and that I agree with   Lavon Paganini. Titus Mould, MD, [place] Pgr: [place] [facility] 05/31/2015 8:12 AM

## 2015-05-31 NOTE — Procedures (Signed)
HPI:  60 y/o woman with a history of Sjogren's disease, hypothyroidism and dysphagia. She was diagnosed with the flu (A) about 4 days ago and started on tamiflu. According to her husband, she had actually been improving the 24 hours prior to admission with fever resolving and improvement in her nausea and vomiting. He reports that the evening of admission she developed weakness and altered mental status. She was brought to [facility] ED via ambulance. She was initially thought to be a code stroke with a right sided facial droop and flaccid paralysis of her right arm. Of note she does have a history of seizure but is not on antiepileptics and had no witnessed seizure activity. In the ED she was found to be in respiratory distress and acutely hypoxic on a non-rebreather. intubated for hypoxic respiratory failure. Paralysis on admission Todd's paralysis?  TECHNICAL SUMMARY:  A multichannel referential and bipolar montage EEG using the standard international 10-20 system was performed on the patient described as sedated and nonresponsive.  There is no occipital dominant rhythm; there is a rare central mu of 8-9 hertz.  Otherwise, there is symmetric beta noted.    ACTIVATION:  PS and HV not done  EPILEPTIFORM ACTIVITY:  There were no spikes, sharp waves or paroxysmal activity.  SLEEP:  No physiologic sleep noted.  CARDIAC:  The EKG lead revealed a regular sinus rhythm.  IMPRESSION:  This EEG demonstrated no focal, hemispheric, or lateralizing features.  There was no epileptiform activity.  There was no occipital dominant rhythm, consistent with the patients sedated state.  If seizure remains high among the list of differential diagnosis, recommend repeating when patient is off of sedation.

## 2015-06-01 ENCOUNTER — Inpatient Hospital Stay (HOSPITAL_COMMUNITY): Payer: BC Managed Care – PPO

## 2015-06-01 DIAGNOSIS — Z87898 Personal history of other specified conditions: Secondary | ICD-10-CM

## 2015-06-01 DIAGNOSIS — Z4659 Encounter for fitting and adjustment of other gastrointestinal appliance and device: Secondary | ICD-10-CM | POA: Diagnosis present

## 2015-06-01 LAB — URINALYSIS, ROUTINE W REFLEX MICROSCOPIC
Bilirubin Urine: NEGATIVE
Ketones, ur: NEGATIVE mg/dL
LEUKOCYTES UA: NEGATIVE
NITRITE: NEGATIVE
PROTEIN: NEGATIVE mg/dL
Specific Gravity, Urine: 1.027 (ref 1.005–1.030)
pH: 6.5 (ref 5.0–8.0)

## 2015-06-01 LAB — POCT I-STAT 3, ART BLOOD GAS (G3+)
ACID-BASE DEFICIT: 11 mmol/L — AB (ref 0.0–2.0)
ACID-BASE DEFICIT: 2 mmol/L (ref 0.0–2.0)
BICARBONATE: 20.5 meq/L (ref 20.0–24.0)
Bicarbonate: 12.3 mEq/L — ABNORMAL LOW (ref 20.0–24.0)
O2 SAT: 99 %
O2 SAT: 99 %
PH ART: 7.405 (ref 7.350–7.450)
TCO2: 13 mmol/L (ref 0–100)
TCO2: 21 mmol/L (ref 0–100)
pCO2 arterial: 19.6 mmHg — CL (ref 35.0–45.0)
pCO2 arterial: 28.2 mmHg — ABNORMAL LOW (ref 35.0–45.0)
pH, Arterial: 7.471 — ABNORMAL HIGH (ref 7.350–7.450)
pO2, Arterial: 123 mmHg — ABNORMAL HIGH (ref 80.0–100.0)
pO2, Arterial: 113 mmHg — ABNORMAL HIGH (ref 80.0–100.0)

## 2015-06-01 LAB — CBC
HEMATOCRIT: 32.8 % — AB (ref 36.0–46.0)
HEMOGLOBIN: 10.8 g/dL — AB (ref 12.0–15.0)
MCH: 27.1 pg (ref 26.0–34.0)
MCHC: 32.9 g/dL (ref 30.0–36.0)
MCV: 82.4 fL (ref 78.0–100.0)
Platelets: 132 10*3/uL — ABNORMAL LOW (ref 150–400)
RBC: 3.98 MIL/uL (ref 3.87–5.11)
RDW: 15.5 % (ref 11.5–15.5)
WBC: 29.4 10*3/uL — ABNORMAL HIGH (ref 4.0–10.5)

## 2015-06-01 LAB — GLUCOSE, CAPILLARY
Glucose-Capillary: 178 mg/dL — ABNORMAL HIGH (ref 65–99)
Glucose-Capillary: 203 mg/dL — ABNORMAL HIGH (ref 65–99)
Glucose-Capillary: 208 mg/dL — ABNORMAL HIGH (ref 65–99)
Glucose-Capillary: 266 mg/dL — ABNORMAL HIGH (ref 65–99)
Glucose-Capillary: 266 mg/dL — ABNORMAL HIGH (ref 65–99)
Glucose-Capillary: 298 mg/dL — ABNORMAL HIGH (ref 65–99)

## 2015-06-01 LAB — BLOOD GAS, ARTERIAL
Acid-base deficit: 7.8 mmol/L — ABNORMAL HIGH (ref 0.0–2.0)
Bicarbonate: 17.8 mEq/L — ABNORMAL LOW (ref 20.0–24.0)
DRAWN BY: 39899
FIO2: 0.4
MECHVT: 440 mL
O2 SAT: 95.1 %
PATIENT TEMPERATURE: 98.6
PCO2 ART: 39.5 mmHg (ref 35.0–45.0)
PEEP/CPAP: 10 cmH2O
PH ART: 7.276 — AB (ref 7.350–7.450)
RATE: 22 resp/min
TCO2: 19 mmol/L (ref 0–100)
pO2, Arterial: 80.6 mmHg (ref 80.0–100.0)

## 2015-06-01 LAB — URINE MICROSCOPIC-ADD ON

## 2015-06-01 LAB — HEPATIC FUNCTION PANEL
ALBUMIN: 1.2 g/dL — AB (ref 3.5–5.0)
ALT: 25 U/L (ref 14–54)
AST: 35 U/L (ref 15–41)
Alkaline Phosphatase: 79 U/L (ref 38–126)
BILIRUBIN INDIRECT: 0.9 mg/dL (ref 0.3–0.9)
Bilirubin, Direct: 0.5 mg/dL (ref 0.1–0.5)
TOTAL PROTEIN: 3.9 g/dL — AB (ref 6.5–8.1)
Total Bilirubin: 1.4 mg/dL — ABNORMAL HIGH (ref 0.3–1.2)

## 2015-06-01 LAB — T4, FREE: FREE T4: 1.17 ng/dL — AB (ref 0.61–1.12)

## 2015-06-01 LAB — CBC WITH DIFFERENTIAL/PLATELET
BAND NEUTROPHILS: 0 %
BASOS PCT: 0 %
Basophils Absolute: 0 10*3/uL (ref 0.0–0.1)
Blasts: 0 %
EOS PCT: 0 %
Eosinophils Absolute: 0 10*3/uL (ref 0.0–0.7)
HEMATOCRIT: 32.9 % — AB (ref 36.0–46.0)
HEMOGLOBIN: 11 g/dL — AB (ref 12.0–15.0)
LYMPHS PCT: 10 %
Lymphs Abs: 4 10*3/uL (ref 0.7–4.0)
MCH: 27.9 pg (ref 26.0–34.0)
MCHC: 33.4 g/dL (ref 30.0–36.0)
MCV: 83.5 fL (ref 78.0–100.0)
Metamyelocytes Relative: 0 %
Monocytes Absolute: 0.8 10*3/uL (ref 0.1–1.0)
Monocytes Relative: 2 %
Myelocytes: 0 %
NEUTROS PCT: 88 %
NRBC: 4 /100{WBCs} — AB
Neutro Abs: 35.3 10*3/uL — ABNORMAL HIGH (ref 1.7–7.7)
Other: 0 %
PROMYELOCYTES ABS: 0 %
Platelets: 121 10*3/uL — ABNORMAL LOW (ref 150–400)
RBC: 3.94 MIL/uL (ref 3.87–5.11)
RDW: 15.8 % — AB (ref 11.5–15.5)
WBC: 40.1 10*3/uL — ABNORMAL HIGH (ref 4.0–10.5)

## 2015-06-01 LAB — BASIC METABOLIC PANEL
Anion gap: 10 (ref 5–15)
BUN: 30 mg/dL — ABNORMAL HIGH (ref 6–20)
CALCIUM: 8.1 mg/dL — AB (ref 8.9–10.3)
CHLORIDE: 112 mmol/L — AB (ref 101–111)
CO2: 17 mmol/L — AB (ref 22–32)
CREATININE: 0.75 mg/dL (ref 0.44–1.00)
GFR calc non Af Amer: >60 mL/min (ref >60)
GLUCOSE: 260 mg/dL — AB (ref 65–99)
Potassium: 3.6 mmol/L (ref 3.5–5.1)
Sodium: 139 mmol/L (ref 135–145)

## 2015-06-01 LAB — COMPREHENSIVE METABOLIC PANEL
ALBUMIN: 1.4 g/dL — AB (ref 3.5–5.0)
ALK PHOS: 83 U/L (ref 38–126)
ALT: 27 U/L (ref 14–54)
ANION GAP: 9 (ref 5–15)
AST: 39 U/L (ref 15–41)
BUN: 39 mg/dL — ABNORMAL HIGH (ref 6–20)
CALCIUM: 8.3 mg/dL — AB (ref 8.9–10.3)
CHLORIDE: 114 mmol/L — AB (ref 101–111)
CO2: 19 mmol/L — AB (ref 22–32)
CREATININE: 0.82 mg/dL (ref 0.44–1.00)
GFR calc Af Amer: >60 mL/min (ref >60)
GFR calc non Af Amer: >60 mL/min (ref >60)
GLUCOSE: 310 mg/dL — AB (ref 65–99)
Potassium: 3.4 mmol/L — ABNORMAL LOW (ref 3.5–5.1)
SODIUM: 142 mmol/L (ref 135–145)
Total Bilirubin: 1.2 mg/dL (ref 0.3–1.2)
Total Protein: 4.2 g/dL — ABNORMAL LOW (ref 6.5–8.1)

## 2015-06-01 LAB — CK TOTAL AND CKMB (NOT AT ARMC)
CK TOTAL: 103 U/L (ref 38–234)
CK, MB: 3.4 ng/mL (ref 0.5–5.0)
Relative Index: 3.3 — ABNORMAL HIGH (ref 0.0–2.5)

## 2015-06-01 LAB — AMYLASE: Amylase: 7 U/L — ABNORMAL LOW (ref 28–100)

## 2015-06-01 LAB — PROTIME-INR
INR: 1.5 — AB (ref 0.00–1.49)
Prothrombin Time: 18.2 seconds — ABNORMAL HIGH (ref 11.6–15.2)

## 2015-06-01 LAB — MAGNESIUM: MAGNESIUM: 2.9 mg/dL — AB (ref 1.7–2.4)

## 2015-06-01 LAB — LACTATE DEHYDROGENASE: LDH: 269 U/L — ABNORMAL HIGH (ref 98–192)

## 2015-06-01 LAB — APTT: aPTT: 41 seconds — ABNORMAL HIGH (ref 24–37)

## 2015-06-01 LAB — PROCALCITONIN: Procalcitonin: 4.17 ng/mL

## 2015-06-01 LAB — URIC ACID: URIC ACID, SERUM: 4.1 mg/dL (ref 2.3–6.6)

## 2015-06-01 LAB — BILIRUBIN, DIRECT: Bilirubin, Direct: 0.8 mg/dL — ABNORMAL HIGH (ref 0.1–0.5)

## 2015-06-01 MED ORDER — INSULIN ASPART 100 UNIT/ML ~~LOC~~ SOLN
0.0000 [IU] | SUBCUTANEOUS | Status: DC
  Administered 2015-06-01: 5 [IU] via SUBCUTANEOUS
  Administered 2015-06-01: 3 [IU] via SUBCUTANEOUS
  Administered 2015-06-01 (×2): 5 [IU] via SUBCUTANEOUS
  Administered 2015-06-02 (×3): 3 [IU] via SUBCUTANEOUS
  Administered 2015-06-02: 2 [IU] via SUBCUTANEOUS
  Administered 2015-06-02: 3 [IU] via SUBCUTANEOUS
  Administered 2015-06-02: 2 [IU] via SUBCUTANEOUS
  Administered 2015-06-02: 3 [IU] via SUBCUTANEOUS
  Administered 2015-06-03: 5 [IU] via SUBCUTANEOUS
  Administered 2015-06-03: 2 [IU] via SUBCUTANEOUS

## 2015-06-01 MED ORDER — ACETAMINOPHEN 160 MG/5ML PO SOLN
650.0000 mg | Freq: Four times a day (QID) | ORAL | Status: DC | PRN
  Administered 2015-06-01 – 2015-06-06 (×4): 650 mg
  Filled 2015-06-01 (×4): qty 20.3

## 2015-06-01 MED ORDER — POTASSIUM CHLORIDE 10 MEQ/100ML IV SOLN
10.0000 meq | INTRAVENOUS | Status: AC
  Administered 2015-06-01 (×2): 10 meq via INTRAVENOUS

## 2015-06-01 MED ORDER — POTASSIUM CHLORIDE 20 MEQ/15ML (10%) PO SOLN
40.0000 meq | Freq: Once | ORAL | Status: DC
  Filled 2015-06-01: qty 30

## 2015-06-01 MED ORDER — POTASSIUM CHLORIDE 10 MEQ/100ML IV SOLN
10.0000 meq | INTRAVENOUS | Status: AC
  Administered 2015-06-01 (×2): 10 meq via INTRAVENOUS
  Filled 2015-06-01 (×4): qty 100

## 2015-06-01 MED ORDER — ME1100 STUDY - ARBEKACIN 150 MG/ML FOR INHALATION
300.0000 mg | Freq: Two times a day (BID) | Status: DC
  Administered 2015-06-01 – 2015-06-07 (×12): 300 mg via RESPIRATORY_TRACT
  Filled 2015-06-01 (×25): qty 2

## 2015-06-01 NOTE — Progress Notes (Signed)
After research treatment settings.

## 2015-06-01 NOTE — Progress Notes (Signed)
During research treatment settings.

## 2015-06-01 NOTE — Progress Notes (Signed)
Nebulized research treatment performed with neb starting at 1707 and neb stopped at 1712. During the treatment, pt was on settings of VT: 485mL's (8cc's), I time of 1.05, Rate of 28, bias flow is unknown. No medication was left in cup after administration. Filters were changed post treatment at 1714. Pt tolerated well with vitals remaining stable as well as tolerated vent setting changes. Pt returned to pre dose settings at 1712.

## 2015-06-01 NOTE — Progress Notes (Signed)
Sputum specimen obtained and sent down to main lab without any complications.

## 2015-06-01 NOTE — Progress Notes (Signed)
Inpatient Diabetes Program Recommendations  AACE/ADA: New Consensus Statement on Inpatient Glycemic Control (2015)  Target Ranges:  Prepandial:   less than 140 mg/dL      Peak postprandial:   less than 180 mg/dL (1-2 hours)      Critically ill patients:  140 - 180 mg/dL   Review of Glycemic Control  Inpatient Diabetes Program Recommendations:   Consider using the ICU Hyperglycemia Protocol Thank you  Raoul Pitch BSN, RN,CDE Inpatient Diabetes Coordinator [phone] (team pager)

## 2015-06-01 NOTE — Progress Notes (Signed)
Name:[name] [name] DGU:[id number] DOB:25-Dec-1955   ADMISSION DATE: 05/29/2015 CONSULTATION DATE: 05/30/15  REFERRING MD: Dr. Leonides Schanz  CHIEF COMPLAINT: Hypoxic respiratory failure  HISTORY OF PRESENT ILLNESS:  [name] [name] is a 60 y/o woman with a history of Sjogren's disease, hypothyroidism and dysphagia. She was diagnosed with the flu (A) about 4 days ago and started on tamiflu. Being managed with dense PNA, group A strep bacteremia, acute resp failure,.     OVERNIGHT EVENTS - Weaning down on the vent to 50% FIO2, PEEP 10 - PCT, Lactate improving - Not requiring pressor support.  - On Fent / Versed - Getting tube feeds, started tamiflu yesterday.   BP 98/54 mmHg  Pulse 108  Temp(Src) 99.3 F (37.4 C) (Core (Comment))  Resp 35  Ht 5' 2"  (1.575 m)  Wt 167 lb 1.7 oz (75.8 kg)  BMI 30.56 kg/m2  SpO2 95% CVP:  [1 mmHg-6 mmHg] 1 mmHg   Recent Labs Lab 05/30/15 1059 05/31/15 0808 06/01/15 0425  NA 132* 138 139  K 4.6 3.8 3.6  CL 104 108 112*  CO2 16* 17* 17*  BUN 32* 30* 30*  CREATININE 0.77 0.71 0.75  GLUCOSE 186* 147* 260*    Recent Labs Lab 05/30/15 0500 05/31/15 0808 06/01/15 0425  HGB 13.6 12.5 10.8*  HCT 40.9 37.8 32.8*  WBC 13.8* 29.1* 29.4*  PLT 266 169 132*   CBG (last 3)   Recent Labs  05/31/15 2024 05/31/15 2342 06/01/15 0330  GLUCAP 178* 203* 178*    ABG    Component Value Date/Time   PHART 7.471* 06/01/2015 0731   PCO2ART 28.2* 06/01/2015 0731   PO2ART 113.0* 06/01/2015 0731   HCO3 20.5 06/01/2015 0731   TCO2 21 06/01/2015 0731   ACIDBASEDEF 2.0 06/01/2015 0731   O2SAT 99.0 06/01/2015 0731    Imaging Dg Abd Portable 1v  05/31/2015  CLINICAL DATA:  Feeding tube placement EXAM: PORTABLE ABDOMEN - 1 VIEW COMPARISON:  None. FINDINGS: Feeding tube with the tip projecting over the proximal duodenum. There is no bowel dilatation to suggest obstruction. There is no evidence of pneumoperitoneum, portal venous gas or  pneumatosis. There are no pathologic calcifications along the expected course of the ureters. The osseous structures are unremarkable. IMPRESSION: Feeding tube with the tip projecting over the proximal duodenum. Electronically Signed   By: Kathreen Devoid   On: 05/31/2015 13:08   STUDIES:  Head CT 2/12 >> no acute hemorrhagic infarct.  Chest CT 2/12 >> ground glass opacities, consolidation c/w pneumonia.  CXR 2/14 >> pending  CULTURES: Blood 2/12>>>Group A Strep Urine 2/12>> negative.  CSF >> Negative   ANTIBIOTICS: Vanc 2/12>>> aztreonam  2/12>>> Acyclovir>>>2/12 levaquin 2/12->> Oseltamavir 2/13 >>  SIGNIFICANT EVENTS: Intubated in ED Shock Refractory hypoxia 2/13 > weaning Vent overnight.   LINES/TUBES: Right subclavian CVC 2/12>> Foley 2/12>> ETT 2/12>> A line 2/12>>>   Physical Exam:  General: paralzyed, sedated  Neuro: paralyzed, perr HEENT:jvd, ETT, NG tube PULM: coarse throughout L>R CV: s1 s2 RRT, No MGR GI: soft, bs wnl, no r/g Extremities: no edema, boots in place.    DISCUSSION: 60 y/o with influenza A and right sided pneumonia in significant respiratory distress, intubated for hypoxic respiratory failure.     ASSESSMENT / PLAN:  PULMONARY A: Pneumonia (necrotizing?) ARDS NOT present as unilateral Dense infiltrate resp alk P:  Lung protective ventilation Left lung down  Avoid auto peep  FIO2 40%, PEEP 8, if able to 5 today  Alkalosis, reduce rate, repeat abg  Dc parlaysis Even to neg balace goal pcxr now and in am   CARDIOVASCULAR A:  Septic shock - resolved SVT resolved from levophed adequate cortisol response Hypotension - not on pressors.  H./o afib? P:  Consider continued Fluids with CVP 1, accurate?, dc Lopressor prn.  Ensure adequate sedation  RENAL A:  AKI. Hyponatremia - resolved  Recent Labs Lab 05/30/15 1059 05/31/15 0808 06/01/15 0425  NA 132* 138 139    P:  Improved with fluids +8L - kvo, dc  cvp Repeat BMP tomorrow kvo  GASTROINTESTINAL A:  Question of esophageal stricture, sjorgens P:  Pt planned for EGD in the fall, not clear from medical record if this was dose.  Cortrak placed yesterday. Tube feeds to goal Protonix.  LFT now with group a strep  INFECTIOUS A:  Pneumonia Flu A Meningitis ruled out.  Septic shock P:  Vanc, aztreonam, levaquin remain await sens / ID D/C Aztreonam.  PCT algorithm.  Blood cx with Group A Strep.  clinda for toxin inhibition, consider dc as off pressors Repeat Cx if worsening.   Endocrine: A: Hypothyroidism, r/o sick euthryoid vs too much synthroid P: IV synthroid ordered until enteral access obtained.  Obtain tsh > 0.272 Obtain t3, t4, may need to hold synthroid   NEUROLOGIC A:  Severe hypoxia requiring paralysis On presentation weakness severe hypoxia P:  RASS goal:-2 to -3 Paralysis on admission Todd's paralysis? EEG without focal findings.  Head CT with no acute findings.  Too sick to travel MRI Dc paralysis today   FAMILY  - Updates: husband daily  - Inter-disciplinary family meet or Palliative Care meeting due by: day [place], MD Family Medicine -PGY 2  STAFF NOTE: I, Merrie Roof, MD FACP have personally reviewed patient's available data, including medical history, events of note, physical examination and test results as part of my evaluation. I have discussed with resident/NP and other care providers such as pharmacist, RN and RRT. In addition, I personally evaluated patient and elicited key findings of: improving daily, less coarse BS, ABg reviewed, reduce rate and repeat abg, goal to peep 8 if able, abg in am , pcxr now in am, she is just off presors, cvp is low and up and down, dc cvp, she have hypothermia and hyperthermia, I have not seen such dense infiltrate with group a strep in lung, she is a good candidate for MEJII trial, MAp goal smet, even balance goals today,  kvo, feeding successful, await sens group A strep, send sputum lung for concern other organism,tamiflu double dose for now, follow crt closely, if declines ro continued fevers, consider IVIG for group a strep bacteremia, some concern necrotizing, dc paralysis, rass to -3 as able, will update husband The patient is critically ill with multiple organ systems failure and requires high complexity decision making for assessment and support, frequent evaluation and titration of therapies, application of advanced monitoring technologies and extensive interpretation of multiple databases.   Critical Care Time devoted to patient care services described in this note is 30 Minutes. This time reflects time of care of this signee: Merrie Roof, MD FACP. This critical care time does not reflect procedure time, or teaching time or supervisory time of PA/NP/Med student/Med Resident etc but could involve care discussion time. Rest per NP/medical resident whose note is outlined above and that I agree with   Lavon Paganini. Titus Mould, MD, [place] Pgr: [place] [facility] 06/01/2015 8:28 AM

## 2015-06-01 NOTE — Research (Signed)
A Randomized, Open-Label Phase1B study of ME110 Inhlation Solution Plus Best Available Therapy in the Treatment of Mechanically Ventilated Subjects with Bacterial Pneumonia (ME1100-CL-103)  This patient, [name] [name], has been consented to the above clinical trial according to FDA regulations, GCP guidelines and [facility] SOPs. The study design has been explained to this patient's LAR, Husband Malachi Pro, by this RN and Dr. Merrie Roof, MD, and the surrogate demonstrated comprehension. No study procedures have been initiated before consenting of this patient/surrogate. This surrogate was given sufficient time for reading the consent and asking questions. All risks, benefits and options have been thoroughly discussed. This patient/surrogate was not coerced in any way to participate in this clinical trial. The surrogate has signed voluntarily at 10:00 am on Jun 01, 2015. This surrogate was given a copy of this consent.   Doreatha Martin, RN  [phone]   Pager [phone]  I have full examined this pt and consented with [facility] direct with husband./ Fully agree with enrollment.  Lavon Paganini. Titus Mould, MD, [place] Pgr: [place] [facility]

## 2015-06-02 ENCOUNTER — Inpatient Hospital Stay (HOSPITAL_COMMUNITY): Payer: BC Managed Care – PPO

## 2015-06-02 DIAGNOSIS — Z978 Presence of other specified devices: Secondary | ICD-10-CM | POA: Insufficient documentation

## 2015-06-02 DIAGNOSIS — Z789 Other specified health status: Secondary | ICD-10-CM

## 2015-06-02 DIAGNOSIS — R4182 Altered mental status, unspecified: Secondary | ICD-10-CM

## 2015-06-02 LAB — URINALYSIS, ROUTINE W REFLEX MICROSCOPIC
BILIRUBIN URINE: NEGATIVE
Glucose, UA: NEGATIVE mg/dL
Hgb urine dipstick: NEGATIVE
Ketones, ur: NEGATIVE mg/dL
LEUKOCYTES UA: NEGATIVE
NITRITE: NEGATIVE
PH: 6 (ref 5.0–8.0)
Protein, ur: NEGATIVE mg/dL
SPECIFIC GRAVITY, URINE: 1.007 (ref 1.005–1.030)

## 2015-06-02 LAB — CULTURE, BLOOD (ROUTINE X 2)

## 2015-06-02 LAB — BLOOD GAS, ARTERIAL
Acid-base deficit: 3.8 mmol/L — ABNORMAL HIGH (ref 0.0–2.0)
BICARBONATE: 19.9 meq/L — AB (ref 20.0–24.0)
Drawn by: 246401
FIO2: 0.4
LHR: 28 {breaths}/min
O2 Saturation: 98 %
PEEP: 8 cmH2O
Patient temperature: 98.6
TCO2: 20.8 mmol/L (ref 0–100)
VT: 440 mL
pCO2 arterial: 31.3 mmHg — ABNORMAL LOW (ref 35.0–45.0)
pH, Arterial: 7.419 (ref 7.350–7.450)
pO2, Arterial: 105 mmHg — ABNORMAL HIGH (ref 80.0–100.0)

## 2015-06-02 LAB — CBC
HCT: 31.7 % — ABNORMAL LOW (ref 36.0–46.0)
Hemoglobin: 10.8 g/dL — ABNORMAL LOW (ref 12.0–15.0)
MCH: 27.6 pg (ref 26.0–34.0)
MCHC: 34.1 g/dL (ref 30.0–36.0)
MCV: 80.9 fL (ref 78.0–100.0)
PLATELETS: 116 10*3/uL — AB (ref 150–400)
RBC: 3.92 MIL/uL (ref 3.87–5.11)
RDW: 15.8 % — ABNORMAL HIGH (ref 11.5–15.5)
WBC: 35.1 10*3/uL — ABNORMAL HIGH (ref 4.0–10.5)

## 2015-06-02 LAB — BASIC METABOLIC PANEL
ANION GAP: 8 (ref 5–15)
Anion gap: 10 (ref 5–15)
BUN: 45 mg/dL — AB (ref 6–20)
BUN: 32 mg/dL — ABNORMAL HIGH (ref 6–20)
CALCIUM: 8.6 mg/dL — AB (ref 8.9–10.3)
CHLORIDE: 107 mmol/L (ref 101–111)
CO2: 19 mmol/L — ABNORMAL LOW (ref 22–32)
CO2: 24 mmol/L (ref 22–32)
CREATININE: 0.65 mg/dL (ref 0.44–1.00)
Calcium: 8.4 mg/dL — ABNORMAL LOW (ref 8.9–10.3)
Chloride: 116 mmol/L — ABNORMAL HIGH (ref 101–111)
Creatinine, Ser: 0.81 mg/dL (ref 0.44–1.00)
GFR calc Af Amer: >60 mL/min (ref >60)
GFR calc non Af Amer: >60 mL/min (ref >60)
GLUCOSE: 231 mg/dL — AB (ref 65–99)
Glucose, Bld: 257 mg/dL — ABNORMAL HIGH (ref 65–99)
POTASSIUM: 4.4 mmol/L (ref 3.5–5.1)
Potassium: 3.6 mmol/L (ref 3.5–5.1)
SODIUM: 143 mmol/L (ref 135–145)
Sodium: 141 mmol/L (ref 135–145)

## 2015-06-02 LAB — GLUCOSE, CAPILLARY
GLUCOSE-CAPILLARY: 174 mg/dL — AB (ref 65–99)
GLUCOSE-CAPILLARY: 209 mg/dL — AB (ref 65–99)
GLUCOSE-CAPILLARY: 218 mg/dL — AB (ref 65–99)
GLUCOSE-CAPILLARY: 234 mg/dL — AB (ref 65–99)
Glucose-Capillary: 204 mg/dL — ABNORMAL HIGH (ref 65–99)
Glucose-Capillary: 212 mg/dL — ABNORMAL HIGH (ref 65–99)

## 2015-06-02 LAB — PHOSPHORUS: PHOSPHORUS: 1.3 mg/dL — AB (ref 2.5–4.6)

## 2015-06-02 LAB — HIGH SENSITIVITY CRP: CRP, High Sensitivity: 288.36 mg/L — ABNORMAL HIGH (ref 0.00–3.00)

## 2015-06-02 LAB — CSF CULTURE W GRAM STAIN: Culture: NO GROWTH

## 2015-06-02 LAB — PROCALCITONIN: PROCALCITONIN: 2.98 ng/mL

## 2015-06-02 LAB — MAGNESIUM: Magnesium: 1.8 mg/dL (ref 1.7–2.4)

## 2015-06-02 LAB — CSF CULTURE

## 2015-06-02 LAB — T3: T3, Total: 55 ng/dL — ABNORMAL LOW (ref 71–180)

## 2015-06-02 MED ORDER — METOPROLOL TARTRATE 1 MG/ML IV SOLN
5.0000 mg | Freq: Four times a day (QID) | INTRAVENOUS | Status: DC
  Administered 2015-06-02 – 2015-06-03 (×2): 5 mg via INTRAVENOUS
  Filled 2015-06-02 (×5): qty 5

## 2015-06-02 MED ORDER — DEXTROSE 5 % IV SOLN
1.0000 g | INTRAVENOUS | Status: DC
  Administered 2015-06-02: 1 g via INTRAVENOUS
  Filled 2015-06-02 (×2): qty 10

## 2015-06-02 MED ORDER — INSULIN GLARGINE 100 UNIT/ML ~~LOC~~ SOLN
10.0000 [IU] | Freq: Every day | SUBCUTANEOUS | Status: DC
  Administered 2015-06-02 – 2015-06-14 (×13): 10 [IU] via SUBCUTANEOUS
  Filled 2015-06-02 (×15): qty 0.1

## 2015-06-02 MED ORDER — IMMUNE GLOBULIN (HUMAN) 5 GM/50ML IV SOLN
1.0000 g/kg | INTRAVENOUS | Status: AC
  Administered 2015-06-02 – 2015-06-03 (×2): 75 g via INTRAVENOUS
  Filled 2015-06-02 (×2): qty 50

## 2015-06-02 MED ORDER — METOPROLOL TARTRATE 1 MG/ML IV SOLN
INTRAVENOUS | Status: AC
  Administered 2015-06-02: 5 mg via INTRAVENOUS
  Filled 2015-06-02: qty 5

## 2015-06-02 MED ORDER — FUROSEMIDE 10 MG/ML IJ SOLN
20.0000 mg | Freq: Two times a day (BID) | INTRAMUSCULAR | Status: DC
  Administered 2015-06-02 – 2015-06-03 (×3): 20 mg via INTRAVENOUS
  Filled 2015-06-02 (×5): qty 2

## 2015-06-02 NOTE — Research (Signed)
ME1100-CL-103  Spoke with PI Dr. Titus Mould over phone in regards to EEG results.  MRI not yet completed.  Dr. Titus Mould confirmed okay to continue drug, no active seizures.  Dose to be given around 2200 as scheduled.    Doreatha Martin, RN   eeg reviewed NO focus, no seizure, appears to be injury, stroke?, mri awaited  Lavon Paganini. Titus Mould, MD, [place] Pgr: [place] [facility] '

## 2015-06-02 NOTE — Progress Notes (Signed)
Nebulized research treatment performed with neb starting at 2206 and neb stopped at 2212. During the treatment, pt was on settings of VT: 432mL's (8cc's), I time of 1.05, Rate of 28, bias flow is unknown. No medication was left in cup after administration. Filters were changed post treatment at 2214. Pt tolerated well with vitals remaining stable as well as tolerated vent setting changes. Pt returned to pre dose settings at 2214.

## 2015-06-02 NOTE — Research (Signed)
CRITICAL VALUE ALERT  Critical value received:  White Blood Cells 38.8  Date of notification:  [id number]  Time of notification:  16:05  Critical value read back:Yes  Nurse who received alert: Doreatha Martin, RN  MD notified (1st page):  Dr. Titus Mould  Time of first page:  16:06  MD notified (2nd page):  Time of second page:  Responding MD:  Dr. Titus Mould  Time MD responded:  16:06  Critical result from [facility], processing samples collected for ME1100-CL-103 study.  Sample was from Day 1 pre-dose (collected [id number] at 15:47).  Already aware of elevated WBC from local sample processing.  No new orders.     Was aware, active treatment on going  Lavon Paganini. Titus Mould, MD, [place] Pgr: [facility]

## 2015-06-02 NOTE — Progress Notes (Signed)
Nebulized research treatment performed with neb starting at 10:37 and neb stopped at 10:43. During the treatment, pt was on settings of VT: 449mL's (8cc's), I time of 1.05 with an I:E ratio of 1:1, Rate of 24 bias flow is unknown. No medication was left in cup after administration. Filters were changed post treatment at 10:44. Pt tolerated well with vitals as follows post treatment HR: 105, RR: 24, BP: 130/71, SAT's: 95%. Pt returned to pre dose settings at 10:43.

## 2015-06-02 NOTE — Progress Notes (Signed)
   06/02/15 1000  Clinical Encounter Type  Visited With Family;Health care provider  Visit Type Initial;Psychological support;Spiritual support;Social support  Referral From Chaplain  Consult/Referral To Chaplain  Spiritual Encounters  Spiritual Needs Emotional  Stress Factors  Family Stress Factors Exhausted   Chaplain stopped by to give some emotional support to the husband of Pt. Via prayer. Chaplain will check in with family later on.  Thanks [employer].

## 2015-06-02 NOTE — Progress Notes (Addendum)
Patient with increased HR as high as 150s around 1930. Resident made aware. Stat BMP, mag and phos drawn. EKG also obtained which showed aflutter. Patient given 5 mg Lopressor per orders with conversion to NSR in the 90s. Will monitor HR closely.

## 2015-06-02 NOTE — Procedures (Signed)
ELECTROENCEPHALOGRAM REPORT  Date of Study: 06/02/2015  Patient's Name: [name] [name] MRN: [id number] Date of Birth: 12-Apr-1956  Referring Provider: Dr. Merrie Roof  Clinical History: This is a 60 year old woman with pneumonia, who developed weakness and change in mental status. She was intubated for airway protection.  Medications: cefTRIAXone (ROCEPHIN) 1 g in dextrose 5 % 50 mL IVPB famotidine (PEPCID) IVPB 20 mg premix furosemide (LASIX) injection 20 mg Immune Globulin 10% (OCTAGAM) IV infusion 75 g levothyroxine (SYNTHROID, LEVOTHROID) injection 50 mcg ME1100 Study - arbekacin 150 mg/mL for inhalation oseltamivir (TAMIFLU) 6 MG/ML suspension 150 mg phenylephrine (NEO-SYNEPHRINE) 40 mg in sodium chloride 0.9 % 250 mL (0.16 mg/mL) infusion  Technical Summary: A multichannel digital EEG recording measured by the international 10-20 system with electrodes applied with paste and impedances below 5000 ohms performed as portable with EKG monitoring in an intubated and unresponsive patient off sedation.  Hyperventilation and photic stimulation were not performed.  The digital EEG was referentially recorded, reformatted, and digitally filtered in a variety of bipolar and referential montages for optimal display.   Description: The patient is intubated and unresponsive during the recording. No sedating medications listed. There is no clear posterior dominant rhythm. The background is disorganized, with a large amount of diffuse 4-6 Hz theta and 2-3 Hz delta slowing, at times sharply contoured without clear epileptogenic potential. Occasional vertex waves were seen. There was minimal reactivity to noxious stimulation.  There were rare broad sharp waves seen over the left frontopolar and frontocentral regions, at times with spread to the right frontal regions. There were no electrographic seizures seen.    EKG lead showed sinus tachycardia.  Impression: This EEG is abnormal due to the  presence of: 1. Moderate diffuse slowing of the background 2. Rare broad sharp waves over the left frontopolar and frontocentral regions  Clinical Correlation of the above findings indicates diffuse cerebral dysfunction that is non-specific in etiology and can be seen with hypoxic/ischemic injury, toxic/metabolic encephalopathies, or medication effect. The broad sharp waves seen over the left frontopolar and frontocentral region indicate a possible epileptogenic potential in these regions. There were no electrographic seizures in this study.   Ellouise Newer, M.D.

## 2015-06-02 NOTE — Progress Notes (Signed)
UR Completed. Gohan Collister, RN, BSN.  [phone] 

## 2015-06-02 NOTE — Progress Notes (Signed)
Name:[name] [name] OIN:[id number] DOB:11/22/1955   ADMISSION DATE: 05/29/2015 CONSULTATION DATE: 05/30/15  REFERRING MD: Dr. Leonides Schanz  CHIEF COMPLAINT: Hypoxic respiratory failure  HISTORY OF PRESENT ILLNESS:  [name] is a 60 y/o woman with a history of Sjogren's disease, hypothyroidism and dysphagia. She was diagnosed with the flu (A) about 4 days ago and started on tamiflu. Being managed with dense PNA, group A strep bacteremia, acute resp failure,.     OVERNIGHT EVENTS - Vent 40% FIO2, PEEP 8, Rate 28.  - PCT continues improving - Not requiring pressor support.  - On Fent this am - Getting tube feeds - Placed on MEJII - RASS -4  BP 97/63 mmHg  Pulse 91  Temp(Src) 99.9 F (37.7 C) (Core (Comment))  Resp 28  Ht 5' 2"  (1.575 m)  Wt 166 lb 0.1 oz (75.3 kg)  BMI 30.36 kg/m2  SpO2 95%     Recent Labs Lab 06/01/15 0425 06/01/15 1140 06/02/15 0500  NA 139 142 143  K 3.6 3.4* 4.4  CL 112* 114* 116*  CO2 17* 19* 19*  BUN 30* 39* 45*  CREATININE 0.75 0.82 0.81  GLUCOSE 260* 310* 257*    Recent Labs Lab 06/01/15 0425 06/01/15 1140 06/02/15 0500  HGB 10.8* 11.0* 10.8*  HCT 32.8* 32.9* 31.7*  WBC 29.4* 40.1* 35.1*  PLT 132* 121* 116*   CBG (last 3)   Recent Labs  06/01/15 2001 06/01/15 2342 06/02/15 0402  GLUCAP 266* 209* 234*    ABG    Component Value Date/Time   PHART 7.276* 06/01/2015 1045   PCO2ART 39.5 06/01/2015 1045   PO2ART 80.6 06/01/2015 1045   HCO3 17.8* 06/01/2015 1045   TCO2 19.0 06/01/2015 1045   ACIDBASEDEF 7.8* 06/01/2015 1045   O2SAT 95.1 06/01/2015 1045    Imaging Dg Chest Port 1 View  06/02/2015  CLINICAL DATA:  Hypoxia/respiratory failure EXAM: PORTABLE CHEST 1 VIEW COMPARISON:  June 01, 2015 FINDINGS: Endotracheal tube tip is 1.3 cm above the carina. Central catheter tip is in the superior vena cava. Feeding tube tip is below the diaphragm. No pneumothorax. There is extensive airspace consolidation  through much of the right mid and lower lung zones. There is patchy consolidation in the medial left base. Heart size and pulmonary vascularity are normal. No adenopathy. IMPRESSION: Tube and catheter positions as described without pneumothorax. Persistent extensive consolidation throughout much the right lung. There is patchy airspace consolidation in the medial left base, marginally increased from 1 day prior. No change in cardiac silhouette. Electronically Signed   By: Lowella Grip III M.D.   On: 06/02/2015 07:28   Dg Chest Port 1 View  06/01/2015  CLINICAL DATA:  Bilateral pneumonia. EXAM: PORTABLE CHEST 1 VIEW COMPARISON:  May 30, 2015. FINDINGS: Stable cardiomediastinal silhouette. Endotracheal tube is in grossly good position and unchanged. Interval placement of feeding tube which is seen entering stomach. Right subclavian catheter line is unchanged in position. No pneumothorax is noted. Stable large right lung pneumonia is noted. Stable minimal opacity is seen in left lung base. IMPRESSION: Stable large right lower lobe pneumonia. Endotracheal tube and right subclavian catheter are unchanged. Interval placement of feeding tube seen entering stomach Electronically Signed   By: Marijo Conception, M.D.   On: 06/01/2015 09:30   Dg Abd Portable 1v  05/31/2015  CLINICAL DATA:  Feeding tube placement EXAM: PORTABLE ABDOMEN - 1 VIEW COMPARISON:  None. FINDINGS: Feeding tube with the tip projecting over the proximal duodenum. There is no bowel  dilatation to suggest obstruction. There is no evidence of pneumoperitoneum, portal venous gas or pneumatosis. There are no pathologic calcifications along the expected course of the ureters. The osseous structures are unremarkable. IMPRESSION: Feeding tube with the tip projecting over the proximal duodenum. Electronically Signed   By: Kathreen Devoid   On: 05/31/2015 13:08   STUDIES:  Head CT 2/12 >> no acute hemorrhagic infarct.  Chest CT 2/12 >> ground glass  opacities, consolidation c/w pneumonia.  CXR 2/14 / 2/15 >> little change. R lung consolidation, ? L infiltrate.   CULTURES: Blood 2/12>>>Group A Strep pan sensitive.  Urine 2/12>> negative.  CSF >> Negative  Resp 2/14 > No organisms on gram stain, culture pending  ANTIBIOTICS: Vanc 2/12>>> aztreonam  2/12>>> 2/14 Acyclovir>>>2/12  levaquin 2/12->> Oseltamavir 2/13 >> MEJII 2/15 >>  SIGNIFICANT EVENTS: Intubated in ED Shock Refractory hypoxia 2/13 > weaning Vent overnight.   LINES/TUBES: Right subclavian CVC 2/12>> Foley 2/12>> ETT 2/12>> A line 2/12>>>2/14   Physical Exam:  General: remains very deep, off paralyticper 3 mm Neuro: sedated, intubated, no cough,m no gag HEENT:jvd, ETT, NG tube PULM: coarse throughout L>R though improving overall.  CV: s1 s2 RRT, No MGR GI: soft, bs wnl, no r/g Extremities: no edema, boots in place.    DISCUSSION: 60 y/o with influenza A and right sided pneumonia in significant respiratory distress, intubated for hypoxic respiratory failure.   ASSESSMENT / PLAN:  PULMONARY A: Pneumonia (necrotizing?) ARDS NOT present as unilateral Dense infiltrate resp alk mild P:  Lung protective ventilation Left lung down  Avoid auto peep  40% peep 8 ABG this am. Previously with resp alkalosis. Mild, peep to 5, rate to 24, no abg required after  Goal fluid balance negative. +2L yesterday pcxr daily.   CARDIOVASCULAR A:  Septic shock - resolved SVT resolved from levophed adequate cortisol response Hypotension - not on pressors.  H./o afib? P:  Fluids off.  Lopressor prn Ensure adequate sedation  Not requiring pressors. Lasix to even   RENAL A:  AKI. Hyponatremia - resolved NONAG met acidosis- saline   Recent Labs Lab 06/01/15 0425 06/01/15 1140 06/02/15 0500  NA 139 142 143    P:  Lasix to even to slight neg Avoid saline 1/2 NS KVO kvo  GASTROINTESTINAL A:  Question of esophageal stricture,  sjorgens P:  Cortrak placed yesterday. Tube feeds to goal Protonix.  LFT normal.   INFECTIOUS A:  Pneumonia (r/o different organism than grou p strep ) Flu A Meningitis ruled out.  Septic shock P:  Dc vanc Would prefer pcn G but concern other organism for lung tamiflu x 7 days Add ceftriaxone  Continued neb Aminoglycoside study drug  Endocrine: A: Hypothyroidism, c/w sick euthryoid P:  IV synthroid ordered, keep same Treat underlying condision repeat T3 T4 in 2 months  NEUROLOGIC A:  Severe hypoxia requiring paralysis On presentation weakness Remains deep  P:  RASS goal:0 Head ct , eeg  FAMILY  - Updates: husband daily  - Inter-disciplinary family meet or Palliative Care meeting due by: day [place], MD Family Medicine -PGY 2   STAFF NOTE: I, Merrie Roof, MD FACP have personally reviewed patient's available data, including medical history, events of note, physical examination and test results as part of my evaluation. I have discussed with resident/NP and other care providers such as pharmacist, RN and RRT. In addition, I personally evaluated patient and elicited key findings of: perrl, corneal's intact, dolls  wnl, like this is oversedation, get CT head, eeg for subclinical seizures, consider MIR today , pcxr improved aeration, vent to min support, abg noted, reduce MV, peep, may be able to sbt in afternoon, maintain tamiflu add stop date, maintain study drug, dc vanc, add ceftriaxone to cover concerns of a different organism causing infiltrate then strep pyogen, pcxr in am may need to [place], may consider IVIG , repeat BC done, UA, consider dc line, echo for beg, unlikely , pct down, still with fevers however, wbc also down The patient is critically ill with multiple organ systems failure and requires high complexity decision making for assessment and support, frequent evaluation and titration of therapies, application of advanced  monitoring technologies and extensive interpretation of multiple databases.   Critical Care Time devoted to patient care services described in this note is 35 Minutes. This time reflects time of care of this signee: Merrie Roof, MD FACP. This critical care time does not reflect procedure time, or teaching time or supervisory time of PA/NP/Med student/Med Resident etc but could involve care discussion time. Rest per NP/medical resident whose note is outlined above and that I agree with   Lavon Paganini. Titus Mould, MD, [place] Pgr: [facility] 06/02/2015 10:29 AM

## 2015-06-02 NOTE — Progress Notes (Signed)
EEG completed; results pending.    

## 2015-06-03 ENCOUNTER — Inpatient Hospital Stay (HOSPITAL_COMMUNITY): Payer: BC Managed Care – PPO

## 2015-06-03 DIAGNOSIS — J1100 Influenza due to unidentified influenza virus with unspecified type of pneumonia: Secondary | ICD-10-CM

## 2015-06-03 DIAGNOSIS — I6300 Cerebral infarction due to thrombosis of unspecified precerebral artery: Secondary | ICD-10-CM

## 2015-06-03 DIAGNOSIS — Z006 Encounter for examination for normal comparison and control in clinical research program: Secondary | ICD-10-CM

## 2015-06-03 DIAGNOSIS — J9600 Acute respiratory failure, unspecified whether with hypoxia or hypercapnia: Secondary | ICD-10-CM

## 2015-06-03 DIAGNOSIS — B950 Streptococcus, group A, as the cause of diseases classified elsewhere: Secondary | ICD-10-CM

## 2015-06-03 DIAGNOSIS — R7881 Bacteremia: Secondary | ICD-10-CM

## 2015-06-03 DIAGNOSIS — I639 Cerebral infarction, unspecified: Secondary | ICD-10-CM | POA: Diagnosis present

## 2015-06-03 DIAGNOSIS — Z9911 Dependence on respirator [ventilator] status: Secondary | ICD-10-CM

## 2015-06-03 DIAGNOSIS — J9690 Respiratory failure, unspecified, unspecified whether with hypoxia or hypercapnia: Secondary | ICD-10-CM | POA: Insufficient documentation

## 2015-06-03 DIAGNOSIS — D72829 Elevated white blood cell count, unspecified: Secondary | ICD-10-CM

## 2015-06-03 LAB — POCT I-STAT 3, ART BLOOD GAS (G3+)
ACID-BASE EXCESS: 4 mmol/L — AB (ref 0.0–2.0)
Acid-Base Excess: 5 mmol/L — ABNORMAL HIGH (ref 0.0–2.0)
BICARBONATE: 26 meq/L — AB (ref 20.0–24.0)
Bicarbonate: 28 mEq/L — ABNORMAL HIGH (ref 20.0–24.0)
O2 Saturation: 99 %
O2 Saturation: 99 %
PCO2 ART: 30.3 mmHg — AB (ref 35.0–45.0)
PCO2 ART: 36.9 mmHg (ref 35.0–45.0)
PH ART: 7.54 — AB (ref 7.350–7.450)
PH ART: 7.489 — AB (ref 7.350–7.450)
PO2 ART: 122 mmHg — AB (ref 80.0–100.0)
Patient temperature: 98
Patient temperature: 37.2
TCO2: 27 mmol/L (ref 0–100)
TCO2: 29 mmol/L (ref 0–100)
pO2, Arterial: 121 mmHg — ABNORMAL HIGH (ref 80.0–100.0)

## 2015-06-03 LAB — CBC
HEMATOCRIT: 32.9 % — AB (ref 36.0–46.0)
Hemoglobin: 11.3 g/dL — ABNORMAL LOW (ref 12.0–15.0)
MCH: 28.3 pg (ref 26.0–34.0)
MCHC: 34.3 g/dL (ref 30.0–36.0)
MCV: 82.3 fL (ref 78.0–100.0)
PLATELETS: 103 10*3/uL — AB (ref 150–400)
RBC: 4 MIL/uL (ref 3.87–5.11)
RDW: 15.9 % — ABNORMAL HIGH (ref 11.5–15.5)
WBC: 28 10*3/uL — AB (ref 4.0–10.5)

## 2015-06-03 LAB — BASIC METABOLIC PANEL
ANION GAP: 7 (ref 5–15)
Anion gap: 9 (ref 5–15)
BUN: 30 mg/dL — AB (ref 6–20)
BUN: 22 mg/dL — ABNORMAL HIGH (ref 6–20)
CHLORIDE: 109 mmol/L (ref 101–111)
CHLORIDE: 103 mmol/L (ref 101–111)
CO2: 24 mmol/L (ref 22–32)
CO2: 26 mmol/L (ref 22–32)
CREATININE: 0.57 mg/dL (ref 0.44–1.00)
CREATININE: 0.59 mg/dL (ref 0.44–1.00)
Calcium: 8.4 mg/dL — ABNORMAL LOW (ref 8.9–10.3)
Calcium: 8.1 mg/dL — ABNORMAL LOW (ref 8.9–10.3)
GFR calc Af Amer: >60 mL/min (ref >60)
GFR calc non Af Amer: >60 mL/min (ref >60)
GFR calc non Af Amer: >60 mL/min (ref >60)
Glucose, Bld: 171 mg/dL — ABNORMAL HIGH (ref 65–99)
Glucose, Bld: 175 mg/dL — ABNORMAL HIGH (ref 65–99)
POTASSIUM: 3.5 mmol/L (ref 3.5–5.1)
POTASSIUM: 3.2 mmol/L — AB (ref 3.5–5.1)
Sodium: 142 mmol/L (ref 135–145)
Sodium: 136 mmol/L (ref 135–145)

## 2015-06-03 LAB — PROCALCITONIN: Procalcitonin: 1.5 ng/mL

## 2015-06-03 LAB — MAGNESIUM: MAGNESIUM: 1.8 mg/dL (ref 1.7–2.4)

## 2015-06-03 LAB — GLUCOSE, CAPILLARY
GLUCOSE-CAPILLARY: 173 mg/dL — AB (ref 65–99)
GLUCOSE-CAPILLARY: 205 mg/dL — AB (ref 65–99)
GLUCOSE-CAPILLARY: 240 mg/dL — AB (ref 65–99)
Glucose-Capillary: 148 mg/dL — ABNORMAL HIGH (ref 65–99)
Glucose-Capillary: 153 mg/dL — ABNORMAL HIGH (ref 65–99)
Glucose-Capillary: 197 mg/dL — ABNORMAL HIGH (ref 65–99)

## 2015-06-03 LAB — LIPID PANEL
CHOL/HDL RATIO: 5.9 ratio
CHOLESTEROL: 95 mg/dL (ref 0–200)
HDL: 16 mg/dL — AB (ref >40)
LDL Cholesterol: 65 mg/dL (ref 0–99)
TRIGLYCERIDES: 72 mg/dL (ref <150)
VLDL: 14 mg/dL (ref 0–40)

## 2015-06-03 LAB — PHOSPHORUS: Phosphorus: 3.5 mg/dL (ref 2.5–4.6)

## 2015-06-03 MED ORDER — POTASSIUM CHLORIDE 20 MEQ/15ML (10%) PO SOLN
40.0000 meq | Freq: Every day | ORAL | Status: DC
  Administered 2015-06-03 – 2015-06-14 (×12): 40 meq via ORAL
  Filled 2015-06-03 (×13): qty 30

## 2015-06-03 MED ORDER — MAGNESIUM SULFATE 2 GM/50ML IV SOLN
2.0000 g | Freq: Once | INTRAVENOUS | Status: AC
  Administered 2015-06-03: 2 g via INTRAVENOUS
  Filled 2015-06-03: qty 50

## 2015-06-03 MED ORDER — FUROSEMIDE 10 MG/ML IJ SOLN
10.0000 mg | Freq: Two times a day (BID) | INTRAMUSCULAR | Status: DC
  Administered 2015-06-03 – 2015-06-05 (×4): 10 mg via INTRAVENOUS
  Filled 2015-06-03 (×3): qty 1
  Filled 2015-06-03: qty 2
  Filled 2015-06-03: qty 1

## 2015-06-03 MED ORDER — HEPARIN (PORCINE) IN NACL 100-0.45 UNIT/ML-% IJ SOLN
1050.0000 [IU]/h | INTRAMUSCULAR | Status: DC
  Administered 2015-06-03: 900 [IU]/h via INTRAVENOUS
  Administered 2015-06-05: 500 [IU]/h via INTRAVENOUS
  Administered 2015-06-07: 700 [IU]/h via INTRAVENOUS
  Administered 2015-06-08 – 2015-06-09 (×2): 950 [IU]/h via INTRAVENOUS
  Administered 2015-06-10: 1100 [IU]/h via INTRAVENOUS
  Filled 2015-06-03 (×9): qty 250

## 2015-06-03 MED ORDER — ETOMIDATE 2 MG/ML IV SOLN
20.0000 mg | Freq: Once | INTRAVENOUS | Status: AC
Start: 2015-06-03 — End: 2015-06-03
  Administered 2015-06-03: 20 mg via INTRAVENOUS

## 2015-06-03 MED ORDER — IOHEXOL 350 MG/ML SOLN
50.0000 mL | Freq: Once | INTRAVENOUS | Status: AC | PRN
  Administered 2015-06-03: 50 mL via INTRAVENOUS

## 2015-06-03 MED ORDER — ASPIRIN 81 MG PO CHEW
81.0000 mg | CHEWABLE_TABLET | Freq: Every day | ORAL | Status: DC
  Filled 2015-06-03: qty 1

## 2015-06-03 MED ORDER — DEXTROSE 5 % IV SOLN
2.0000 g | INTRAVENOUS | Status: DC
  Filled 2015-06-03: qty 2

## 2015-06-03 MED ORDER — ETOMIDATE 2 MG/ML IV SOLN
INTRAVENOUS | Status: AC
  Filled 2015-06-03: qty 10

## 2015-06-03 MED ORDER — INSULIN ASPART 100 UNIT/ML ~~LOC~~ SOLN
0.0000 [IU] | Freq: Three times a day (TID) | SUBCUTANEOUS | Status: DC
  Administered 2015-06-03: 5 [IU] via SUBCUTANEOUS
  Administered 2015-06-03: 3 [IU] via SUBCUTANEOUS

## 2015-06-03 MED ORDER — INSULIN ASPART 100 UNIT/ML ~~LOC~~ SOLN
0.0000 [IU] | SUBCUTANEOUS | Status: DC
  Administered 2015-06-03: 2 [IU] via SUBCUTANEOUS
  Administered 2015-06-03 – 2015-06-04 (×3): 3 [IU] via SUBCUTANEOUS
  Administered 2015-06-05: 2 [IU] via SUBCUTANEOUS
  Administered 2015-06-05 – 2015-06-06 (×3): 3 [IU] via SUBCUTANEOUS
  Administered 2015-06-06 (×2): 2 [IU] via SUBCUTANEOUS
  Administered 2015-06-06: 3 [IU] via SUBCUTANEOUS
  Administered 2015-06-06: 2 [IU] via SUBCUTANEOUS
  Administered 2015-06-07 (×2): 3 [IU] via SUBCUTANEOUS
  Administered 2015-06-07: 2 [IU] via SUBCUTANEOUS
  Administered 2015-06-07 – 2015-06-08 (×2): 3 [IU] via SUBCUTANEOUS
  Administered 2015-06-08 – 2015-06-09 (×6): 2 [IU] via SUBCUTANEOUS
  Administered 2015-06-09: 3 [IU] via SUBCUTANEOUS
  Administered 2015-06-10 – 2015-06-14 (×12): 2 [IU] via SUBCUTANEOUS

## 2015-06-03 MED ORDER — FENTANYL CITRATE (PF) 100 MCG/2ML IJ SOLN
100.0000 ug | INTRAMUSCULAR | Status: DC | PRN
  Administered 2015-06-07: 100 ug via INTRAVENOUS
  Filled 2015-06-03: qty 2

## 2015-06-03 MED ORDER — FENTANYL CITRATE (PF) 100 MCG/2ML IJ SOLN
INTRAMUSCULAR | Status: AC
  Filled 2015-06-03: qty 2

## 2015-06-03 MED ORDER — POTASSIUM CHLORIDE 20 MEQ/15ML (10%) PO SOLN
40.0000 meq | Freq: Once | ORAL | Status: AC
  Administered 2015-06-03: 40 meq
  Filled 2015-06-03: qty 30

## 2015-06-03 MED ORDER — WHITE PETROLATUM GEL
Status: AC
  Administered 2015-06-03: 0.2
  Filled 2015-06-03: qty 1

## 2015-06-03 MED ORDER — FENTANYL CITRATE (PF) 100 MCG/2ML IJ SOLN
50.0000 ug | INTRAMUSCULAR | Status: DC | PRN
  Administered 2015-06-03 (×2): 50 ug via INTRAVENOUS
  Administered 2015-06-04: 100 ug via INTRAVENOUS
  Filled 2015-06-03: qty 2

## 2015-06-03 MED ORDER — METOPROLOL TARTRATE 1 MG/ML IV SOLN: 5.0000 mg | Freq: Four times a day (QID) | INTRAVENOUS | Status: DC | PRN

## 2015-06-03 MED ORDER — INSULIN ASPART 100 UNIT/ML ~~LOC~~ SOLN: 0.0000 [IU] | Freq: Three times a day (TID) | SUBCUTANEOUS | Status: DC

## 2015-06-03 MED ORDER — DEXTROSE 5 % IV SOLN
2.0000 g | Freq: Two times a day (BID) | INTRAVENOUS | Status: DC
  Administered 2015-06-03 – 2015-06-16 (×27): 2 g via INTRAVENOUS
  Filled 2015-06-03 (×33): qty 2

## 2015-06-03 MED ORDER — POTASSIUM CHLORIDE 20 MEQ/15ML (10%) PO SOLN: 20.0000 meq | Freq: Every day | ORAL | Status: DC

## 2015-06-03 MED ORDER — POTASSIUM PHOSPHATES 15 MMOLE/5ML IV SOLN
20.0000 mmol | Freq: Once | INTRAVENOUS | Status: AC
  Administered 2015-06-03: 20 mmol via INTRAVENOUS
  Filled 2015-06-03: qty 6.67

## 2015-06-03 MED ORDER — CLOPIDOGREL BISULFATE 75 MG PO TABS
75.0000 mg | ORAL_TABLET | Freq: Every day | ORAL | Status: DC
  Administered 2015-06-03: 75 mg via ORAL
  Filled 2015-06-03: qty 1

## 2015-06-03 MED ORDER — ATORVASTATIN CALCIUM 40 MG PO TABS
40.0000 mg | ORAL_TABLET | Freq: Every day | ORAL | Status: DC
  Administered 2015-06-03 – 2015-06-11 (×8): 40 mg via ORAL
  Filled 2015-06-03 (×9): qty 1

## 2015-06-03 NOTE — Procedures (Signed)
Bronchoscopy Procedure Note Annaelizabeth Raisbeck [id number] 1956/03/23  Procedure: Bronchoscopy Indications: Diagnostic evaluation of the airways, Obtain specimens for culture and/or other diagnostic studies and Remove secretions  Procedure Details Consent: Risks of procedure as well as the alternatives and risks of each were explained to the (patient/caregiver).  Consent for procedure obtained. Time Out: Verified patient identification, verified procedure, site/side was marked, verified correct patient position, special equipment/implants available, medications/allergies/relevent history reviewed, required imaging and test results available.  Performed  In preparation for procedure, patient was given 100% FiO2 and bronchoscope lubricated. Sedation: fent  Airway entered and the following bronchi were examined: RUL, RML, RLL and Bronchi.   Procedures performed: Brushings performed- no Bronchoscope removed.  , Patient placed back on 100% FiO2 at conclusion of procedure.    Evaluation Hemodynamic Status: BP stable throughout; O2 sats: stable throughout Patient's Current Condition: stable Specimens:  Sent serosanguinous fluid Complications: No apparent complications Patient did tolerate procedure well.   Raylene Miyamoto. 06/03/2015   1. Tracheal and bilateral proximal mainstems inflammatory pus nodules, denuded mucosa 2 PUS RLL, RML 3. Erythema rt main, BI 4. BAL RML x 4 performed 5. Avoided LEft to avoid contamination to left  Lavon Paganini. Titus Mould, MD, [place] Pgr: [facility]

## 2015-06-03 NOTE — Progress Notes (Signed)
Nebulized research treatment performed with neb starting at 08:05 and neb stopped at 08:11. During the treatment, pt was on settings of VT: 444mL's (8cc's), I time of 1.05 with an I:E ratio of 1:1, Rate of 24 bias flow is unknown. No medication was left in cup after administration. Filters were changed post treatment at 08:12. Pt tolerated well with vitals as follows post treatment HR: 74, RR: 24, BP: 142/69, SAT's: 94%. Pt returned to pre dose settings at 08:11.

## 2015-06-03 NOTE — Progress Notes (Signed)
Wasted 200 ml of fentanyl and 10 ml versed in sink. Tvedt Woods RN witnessed waste.

## 2015-06-03 NOTE — Progress Notes (Signed)
Nebulized research treatment performed with neb starting at 21:48 and neb stopped at 21:55 . During the treatment, pt was on settings of VT: 425mL's (8cc's), I time of 1.15 with an I:E ratio of 1:1,  bias flow is unknown. No medication was left in cup after administration. Filters were changed post treatment at 21:58. Pt tolerated well with vitals as follows post treatment HR: 100, RR: 27, BP: 122/61, SAT's: 91%. Pt returned to pre dose settings at 2155.

## 2015-06-03 NOTE — Progress Notes (Signed)
ANTICOAGULATION CONSULT NOTE - Initial Consult   Pharmacy Consult for heparin  Indication: AFlutter  Allergies  Allergen Reactions  . Erythromycin Ethylsuccinate Hives  . Keflex [Cephalexin] Hives  . Penicillins   . Sulfa Antibiotics     Patient Measurements: Height: 5\' 2"  (157.5 cm) Weight: 151 lb 10.8 oz (68.8 kg) IBW/kg (Calculated) : 50.1 Heparin Dosing Weight: 65 kg  Vital Signs: Temp: 98.1 F (36.7 C) (02/16 1500) BP: 126/69 mmHg (02/16 1510) Pulse Rate: 66 (02/16 1510)  Labs:  Recent Labs  06/01/15 1140 06/02/15 0500 06/02/15 1945 06/03/15 0400  HGB 11.0* 10.8*  --  11.3*  HCT 32.9* 31.7*  --  32.9*  PLT 121* 116*  --  103*  APTT 41*  --   --   --   LABPROT 18.2*  --   --   --   INR 1.50*  --   --   --   CREATININE 0.82 0.81 0.65 0.57  CKTOTAL 103  --   --   --   CKMB 3.4  --   --   --     Estimated Creatinine Clearance: 67.2 mL/min (by C-G formula based on Cr of 0.57).   Medical History: Past Medical History  Diagnosis Date  . Hypothyroidism   . Seizures ([place])   . Adenopathy   . Scoliosis   . Spleen anomaly   . Anemia   . Sjogren's disease ([place])   . Raynaud disease   . Shingles   . Osteopenia     Medications:  Prescriptions prior to admission  Medication Sig Dispense Refill Last Dose  . cetirizine (ZYRTEC) 10 MG tablet Take 10 mg by mouth daily.   Past Week at Unknown time  . conjugated estrogens (PREMARIN) vaginal cream Place 1 Applicatorful vaginally 2 (two) times a week.   Past Week at Unknown time  . fluticasone (FLONASE) 50 MCG/ACT nasal spray Place 2 sprays into both nostrils daily.   Past Week at Unknown time  . HYDROcodone-homatropine (HYCODAN) 5-1.5 MG/5ML syrup Take 1 mL by mouth every 6 (six) hours as needed.   Past Week at Unknown time  . hydroxychloroquine (PLAQUENIL) 200 MG tablet Take by mouth daily.   Past Week at Unknown time  . levothyroxine (SYNTHROID, LEVOTHROID) 112 MCG tablet Take 112 mcg by mouth daily before  breakfast.   Past Week at Unknown time  . Multiple Vitamin (MULTIVITAMIN) tablet Take 1 tablet by mouth daily.   Past Week at Unknown time  . ondansetron (ZOFRAN-ODT) 4 MG disintegrating tablet Take 1 tablet by mouth every 8 (eight) hours as needed.   Past Week at Unknown time  . [EXPIRED] oseltamivir (TAMIFLU) 75 MG capsule Take 1 capsule by mouth 2 (two) times daily.   Past Week at Unknown time  . predniSONE (DELTASONE) 10 MG tablet Take 10 mg by mouth daily with breakfast.   Past Week at Unknown time  . predniSONE (DELTASONE) 20 MG tablet Take 1 tablet by mouth daily.   Past Week at Unknown time  . tri-vitamin w/ fluoride (TRI-VI-SOL) 0.25 MG/ML solution Take 0.25 mg by mouth daily.   Past Week at Unknown time  . vitamin B-12 (CYANOCOBALAMIN) 1000 MCG tablet Take 1,000 mcg by mouth daily.   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 Pt showing new onset Aflutter, MD recommending heparin.  hgb stable, plts  slightly low.  Goal of Therapy:  Heparin level 0.3-0.5 Monitor platelets by anticoagulation protocol: Yes   Plan:  -Heparin 900 units/hr -Daily HL, CBC -Check level this evening -Monitor s/sx bleeding   Hughes Better, PharmD, BCPS Clinical Pharmacist Pager: [phone] 06/03/2015 4:46 PM

## 2015-06-03 NOTE — Progress Notes (Signed)
Inpatient Diabetes Program Recommendations  AACE/ADA: New Consensus Statement on Inpatient Glycemic Control (2015)  Target Ranges:  Prepandial:   less than 140 mg/dL      Peak postprandial:   less than 180 mg/dL (1-2 hours)      Critically ill patients:  140 - 180 mg/dL   Review of Glycemic Control  Inpatient Diabetes Program Recommendations:  Correction (SSI): change Novolog correction to Q4 since pt is NPO Thank you  Raoul Pitch BSN, RN,CDE Inpatient Diabetes Coordinator [phone] (team pager)

## 2015-06-03 NOTE — Procedures (Signed)
Bedside Bronchoscopy Procedure Note [name] [name] [id number] 1955-05-07  Procedure: Bronchoscopy Indications: Diagnostic evaluation of the airways, Obtain specimens for culture and/or other diagnostic studies and Remove secretions  Procedure Details: ET Tube Size: ET Tube secured at lip (cm): Bite block in place: Yes In preparation for procedure, Patient hyper-oxygenated with 100 % FiO2 and Saline given via ETT (140 ml) for study drug. Airway entered and the following bronchi were examined: RUL, RML, RLL, LUL, LLL and Bronchi.   Bronchoscope removed.  , Patient placed back on 100% FiO2 at conclusion of procedure.    Evaluation BP 126/69 mmHg  Pulse 66  Temp(Src) 98.1 F (36.7 C) (Core (Comment))  Resp 20  Ht 5\' 2"  (1.575 m)  Wt 151 lb 10.8 oz (68.8 kg)  BMI 27.73 kg/m2  SpO2 94% Breath Sounds:Clear and Diminished O2 sats: stable throughout Patient's Current Condition: stable Specimens:  Sent serosanguinous fluid Complications: No apparent complications Patient did tolerate procedure well.   [name] [name], Eddie [place] 06/03/2015, 4:10 PM

## 2015-06-03 NOTE — Progress Notes (Signed)
[facility] Progress Note Patient Name: [name] [name] DOB: 1956-01-26 MRN: [id number]   Date of Service  06/03/2015  HPI/Events of Note  Called by radiology, notified of distal common carotid small clot.  Discussed with Dr. Erlinda Hong who is aware and evaluating the patient now.   eICU Interventions  He is updating family.  Anticoagulation per neurology. Will call cardiology to arrange TEE for tomorrow.     Intervention Category Intermediate Interventions: Diagnostic test evaluation  Simonne Maffucci 06/03/2015, 4:21 PM

## 2015-06-03 NOTE — Progress Notes (Signed)
  Echocardiogram 2D Echocardiogram has been performed.  [name] [name] 06/03/2015, 8:41 AM

## 2015-06-03 NOTE — Consult Note (Signed)
[name]  Date of Admission:  05/29/2015  Date of Consult:  06/03/2015  Reason for Consult: Group A Strep bacteremia, Cerebral Embolism Referring Physician: Titus Mould  Impression/Recommendation Group A Strep Bacteremia Would check EGD to see if she can tolerated TEE (has hx of esophageal strictures) Would check TEE Repeat BCx ngtd/pending  Cerebral Embolism  Pneumonia Influenza Complete tamiflu tomorrow  Leukocytosis (steroid effect)   Comment- Her constellation of findings could be consistent with embolic source.  Further work would be helpful.  Would favor longer term course of anbx.  Could her RA have caused some of her lung findings?  Thank you so much for this interesting consult,   Bobby Rumpf (pager) [phone] [email or web address]  [name] is an 60 y.o. female.  HPI: 60 yo F with hx of hypothyroidism, Sjogren's and RA (on plaquenil, prednisone 10), adm on 2-11 with flu sx (started on tamiflu 2-11), followed by gait instability, weakness. She then developed altered mental status. In ED she was hypoxic. Her CXR showed R sided infiltrate.   She was started on acyclovir, vanco/aztr/levaquin/oseltamaivrdecadron. Her CT showed chest showed ground glass infiltrate/ pulm nodules. CT head showed no acute change.  She required intubation and levophed in first 24h.  LP done 2-12 showed 2 WBC, 0 RBC, normal protein. Cx negative. Her BCx since grew GPC chains, since resulted as Group A strep.  On 2-14 was started on inhaled aminoglycoside research study drug.  She underwent EEG on 2-15 which did not show seizure focus.  On 2-15 pt was changed to ceftriaxone alone.   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 TTE (today) showed no vegetation.   Past Medical History  Diagnosis Date  . Hypothyroidism   . Seizures ([place])   . Adenopathy   . Scoliosis   . Spleen anomaly   . Anemia   . Sjogren's disease ([place])   . Raynaud disease     . Shingles   . Osteopenia     Past Surgical History  Procedure Laterality Date  . Tonsillectomy    . Breast cyst aspiration Right 1990's    neg     Allergies  Allergen Reactions  . Erythromycin Ethylsuccinate Hives  . Keflex [Cephalexin] Hives  . Penicillins   . Sulfa Antibiotics     Medications:  Scheduled: . antiseptic oral rinse  7 mL Mouth Rinse 10 times per day  . atorvastatin  40 mg Oral q1800  . cefTRIAXone (ROCEPHIN)  IV  2 g Intravenous Q12H  . chlorhexidine gluconate  15 mL Mouth Rinse BID  . clopidogrel  75 mg Oral Daily  . famotidine (PEPCID) IV  20 mg Intravenous Q12H  . furosemide  10 mg Intravenous BID  . heparin  5,000 Units Subcutaneous 3 times per day  . Immune Globulin 10%  1 g/kg Intravenous Q24 Hr x 2  . insulin aspart  0-15 Units Subcutaneous TID WC  . insulin glargine  10 Units Subcutaneous Daily  . levothyroxine  50 mcg Intravenous Daily  . magnesium sulfate 1 - 4 g bolus IVPB  2 g Intravenous Once  . ME1100 Study - arbekacin 150 mg/mL for inhalation  300 mg Inhalation BID  . oseltamivir  150 mg Per Tube BID  . potassium chloride  40 mEq Oral Daily  . potassium phosphate IVPB (mmol)  20 mmol Intravenous Once    Abtx:  Anti-infectives    Start     Dose/Rate Route Frequency Ordered Stop  06/03/15 1045  cefTRIAXone (ROCEPHIN) 2 g in dextrose 5 % 50 mL IVPB  Status:  Discontinued     2 g 100 mL/hr over 30 Minutes Intravenous Every 24 hours 06/03/15 0924 06/03/15 0932   06/03/15 1000  cefTRIAXone (ROCEPHIN) 2 g in dextrose 5 % 50 mL IVPB     2 g 100 mL/hr over 30 Minutes Intravenous Every 12 hours 06/03/15 0932     06/02/15 1045  cefTRIAXone (ROCEPHIN) 1 g in dextrose 5 % 50 mL IVPB  Status:  Discontinued     1 g 100 mL/hr over 30 Minutes Intravenous Every 24 hours 06/02/15 1040 06/03/15 0924   06/01/15 0000  vancomycin (VANCOCIN) IVPB 1000 mg/200 mL premix  Status:  Discontinued     1,000 mg 200 mL/hr over 60 Minutes Intravenous Every 12  hours 05/31/15 1340 06/02/15 1040   05/31/15 2200  oseltamivir (TAMIFLU) 6 MG/ML suspension 150 mg  Status:  Discontinued     150 mg Per Tube 2 times daily 05/31/15 1111 05/31/15 1421   05/31/15 1430  oseltamivir (TAMIFLU) 6 MG/ML suspension 150 mg     150 mg Per Tube 2 times daily 05/31/15 1421 06/07/15 0959   05/31/15 1400  clindamycin (CLEOCIN) IVPB 600 mg     600 mg 100 mL/hr over 30 Minutes Intravenous 3 times per day 05/31/15 1335 06/01/15 2202   05/30/15 2300  levofloxacin (LEVAQUIN) IVPB 750 mg  Status:  Discontinued     750 mg 100 mL/hr over 90 Minutes Intravenous Every 24 hours 05/30/15 0745 06/02/15 1040   05/30/15 1300  oseltamivir (TAMIFLU) 6 MG/ML suspension 75 mg  Status:  Discontinued     75 mg Per Tube 2 times daily 05/30/15 1252 05/31/15 1111   05/30/15 1200  vancomycin (VANCOCIN) 500 mg in sodium chloride 0.9 % 100 mL IVPB  Status:  Discontinued     500 mg 100 mL/hr over 60 Minutes Intravenous Every 12 hours 05/30/15 0745 05/31/15 1340   05/30/15 0800  acyclovir (ZOVIRAX) 500 mg in dextrose 5 % 100 mL IVPB  Status:  Discontinued     500 mg 110 mL/hr over 60 Minutes Intravenous 3 times per day 05/30/15 0725 05/30/15 1245   05/30/15 0800  aztreonam (AZACTAM) 2 g in dextrose 5 % 50 mL IVPB  Status:  Discontinued     2 g 100 mL/hr over 30 Minutes Intravenous Every 8 hours 05/30/15 0745 05/31/15 1331   05/30/15 0030  acyclovir (ZOVIRAX) 500 mg in dextrose 5 % 100 mL IVPB  Status:  Discontinued     500 mg 110 mL/hr over 60 Minutes Intravenous  Once 05/30/15 0016 05/30/15 0902   05/30/15 0015  levofloxacin (LEVAQUIN) IVPB 750 mg     750 mg 100 mL/hr over 90 Minutes Intravenous  Once 05/30/15 0001 05/30/15 0232   05/30/15 0015  aztreonam (AZACTAM) 2 g in dextrose 5 % 50 mL IVPB     2 g 100 mL/hr over 30 Minutes Intravenous  Once 05/30/15 0001 05/30/15 0115   05/30/15 0015  vancomycin (VANCOCIN) IVPB 1000 mg/200 mL premix     1,000 mg 200 mL/hr over 60 Minutes Intravenous   Once 05/30/15 0001 05/30/15 0232      Total days of antibiotics: 5 (ceftriaxone)           Social History:  reports that she has never smoked. She has never used smokeless tobacco. She reports that she does not drink alcohol or use illicit drugs.  Family History  Problem Relation Age of Onset  . Breast cancer Maternal Grandfather 60    General ROS: unobtainable. pt on vent.  Blood pressure 154/84, pulse 90, temperature 99.1 F (37.3 C), temperature source Core (Comment), resp. rate 28, height _0  (1.575 m), weight 68.8 kg (151 lb 10.8 oz), SpO2 95 %. General appearance: no distress and on vent Eyes: no injection.  Throat: ET tube Lungs: rhonchi anterior - bilateral Heart: regular rate and rhythm Abdomen: normal findings: bowel sounds normal and soft Extremities: Edema/anasarca LUE. no nail bed lesions on hands or feet.    Results for orders placed or performed during the hospital encounter of 05/29/15 (from the past 48 hour(s))  Culture, respiratory (NON-Expectorated)     Status: None (Preliminary result)   Collection Time: 06/01/15 10:35 AM  Result Value Ref Range   Specimen Description SPUTUM    Special Requests NONE    Gram Stain      ABUNDANT WBC PRESENT,BOTH PMN AND MONONUCLEAR FEW SQUAMOUS EPITHELIAL CELLS PRESENT NO ORGANISMS SEEN Performed at [facility]    Culture      Culture reincubated for better growth Performed at [facility]    Report Status PENDING   Blood gas, arterial     Status: Abnormal   Collection Time: 06/01/15 10:45 AM  Result Value Ref Range   FIO2 0.40    Delivery systems VENTILATOR    Mode PRESSURE REGULATED VOLUME CONTROL    VT 440 mL   LHR 22 resp/min   Peep/cpap 10.0 cm H20   pH, Arterial 7.276 (L) 7.350 - 7.450   pCO2 arterial 39.5 35.0 - 45.0 mmHg   pO2, Arterial 80.6 80.0 - 100.0 mmHg   Bicarbonate 17.8 (L) 20.0 - 24.0 mEq/L   TCO2 19.0 0 - 100 mmol/L   Acid-base deficit 7.8 (H) 0.0 - 2.0 mmol/L   O2  Saturation 95.1 %   Patient temperature 98.6    Collection site A-LINE    Drawn by [phone]    Sample type ARTERIAL DRAW    Allens test (pass/fail) PASS PASS  Glucose, capillary     Status: Abnormal   Collection Time: 06/01/15 11:29 AM  Result Value Ref Range   Glucose-Capillary 266 (H) 65 - 99 mg/dL  Comprehensive metabolic panel     Status: Abnormal   Collection Time: 06/01/15 11:40 AM  Result Value Ref Range   Sodium 142 135 - 145 mmol/L   Potassium 3.4 (L) 3.5 - 5.1 mmol/L   Chloride 114 (H) 101 - 111 mmol/L   CO2 19 (L) 22 - 32 mmol/L   Glucose, Bld 310 (H) 65 - 99 mg/dL   BUN 39 (H) 6 - 20 mg/dL   Creatinine, Ser 0.82 0.44 - 1.00 mg/dL   Calcium 8.3 (L) 8.9 - 10.3 mg/dL   Total Protein 4.2 (L) 6.5 - 8.1 g/dL   Albumin 1.4 (L) 3.5 - 5.0 g/dL   AST 39 15 - 41 U/L   ALT 27 14 - 54 U/L   Alkaline Phosphatase 83 38 - 126 U/L   Total Bilirubin 1.2 0.3 - 1.2 mg/dL   GFR calc non Af Amer >60 >60 mL/min   GFR calc Af Amer >60 >60 mL/min    Comment: (NOTE) The eGFR has been calculated using the CKD EPI equation. This calculation has not been validated in all clinical situations. eGFR's persistently <60 mL/min signify possible Chronic Kidney Disease.    Anion gap 9 5 - 15  CBC with Differential/Platelet  Status: Abnormal   Collection Time: 06/01/15 11:40 AM  Result Value Ref Range   WBC 40.1 (H) 4.0 - 10.5 K/uL    Comment: WHITE COUNT CONFIRMED ON SMEAR   RBC 3.94 3.87 - 5.11 MIL/uL   Hemoglobin 11.0 (L) 12.0 - 15.0 g/dL   HCT 32.9 (L) 36.0 - 46.0 %   MCV 83.5 78.0 - 100.0 fL   MCH 27.9 26.0 - 34.0 pg   MCHC 33.4 30.0 - 36.0 g/dL   RDW 15.8 (H) 11.5 - 15.5 %   Platelets 121 (L) 150 - 400 K/uL   Neutrophils Relative % 88 %   Lymphocytes Relative 10 %   Monocytes Relative 2 %   Eosinophils Relative 0 %   Basophils Relative 0 %   Band Neutrophils 0 %   Metamyelocytes Relative 0 %   Myelocytes 0 %   Promyelocytes Absolute 0 %   Blasts 0 %   nRBC 4 (H) 0 /100 WBC    Other 0 %   Neutro Abs 35.3 (H) 1.7 - 7.7 K/uL   Lymphs Abs 4.0 0.7 - 4.0 K/uL   Monocytes Absolute 0.8 0.1 - 1.0 K/uL   Eosinophils Absolute 0.0 0.0 - 0.7 K/uL   Basophils Absolute 0.0 0.0 - 0.1 K/uL   RBC Morphology RARE NRBCs     Comment: HOWELL/JOLLY BODIES BURR CELLS    WBC Morphology MILD LEFT SHIFT (1-5% METAS, OCC MYELO, OCC BANDS)     Comment: TOXIC GRANULATION   Smear Review LARGE PLATELETS PRESENT   APTT     Status: Abnormal   Collection Time: 06/01/15 11:40 AM  Result Value Ref Range   aPTT 41 (H) 24 - 37 seconds    Comment:        IF BASELINE aPTT IS ELEVATED, SUGGEST PATIENT RISK ASSESSMENT BE USED TO DETERMINE APPROPRIATE ANTICOAGULANT THERAPY.   Protime-INR     Status: Abnormal   Collection Time: 06/01/15 11:40 AM  Result Value Ref Range   Prothrombin Time 18.2 (H) 11.6 - 15.2 seconds   INR 1.50 (H) 0.00 - 1.49  Magnesium     Status: Abnormal   Collection Time: 06/01/15 11:40 AM  Result Value Ref Range   Magnesium 2.9 (H) 1.7 - 2.4 mg/dL  Bilirubin, direct     Status: Abnormal   Collection Time: 06/01/15 11:40 AM  Result Value Ref Range   Bilirubin, Direct 0.8 (H) 0.1 - 0.5 mg/dL  CK total and CKMB (cardiac)not at [facility]     Status: Abnormal   Collection Time: 06/01/15 11:40 AM  Result Value Ref Range   Total CK 103 38 - 234 U/L   CK, MB 3.4 0.5 - 5.0 ng/mL   Relative Index 3.3 (H) 0.0 - 2.5  Amylase     Status: Abnormal   Collection Time: 06/01/15 11:40 AM  Result Value Ref Range   Amylase 7 (L) 28 - 100 U/L  Lactate dehydrogenase     Status: Abnormal   Collection Time: 06/01/15 11:40 AM  Result Value Ref Range   LDH 269 (H) 98 - 192 U/L  Uric acid     Status: None   Collection Time: 06/01/15 11:40 AM  Result Value Ref Range   Uric Acid, Serum 4.1 2.3 - 6.6 mg/dL  High sensitivity CRP     Status: Abnormal   Collection Time: 06/01/15  1:30 PM  Result Value Ref Range   CRP, High Sensitivity 288.36 (H) 0.00 - 3.00 mg/L    Comment: (NOTE)  Relative Risk for Future Cardiovascular Event                             Low                 <1.00                             Average       1.00 - 3.00                             High                >3.00 Performed At: [facility] [place], [place] [id number] Lindon Romp MD [id number]   Urinalysis, Routine w reflex microscopic (not at [facility])     Status: Abnormal   Collection Time: 06/01/15  1:45 PM  Result Value Ref Range   Color, Urine YELLOW YELLOW   APPearance CLEAR CLEAR   Specific Gravity, Urine 1.027 1.005 - 1.030   pH 6.5 5.0 - 8.0   Glucose, UA >1000 (A) NEGATIVE mg/dL   Hgb urine dipstick TRACE (A) NEGATIVE   Bilirubin Urine NEGATIVE NEGATIVE   Ketones, ur NEGATIVE NEGATIVE mg/dL   Protein, ur NEGATIVE NEGATIVE mg/dL   Nitrite NEGATIVE NEGATIVE   Leukocytes, UA NEGATIVE NEGATIVE  Urine microscopic-add on     Status: Abnormal   Collection Time: 06/01/15  1:45 PM  Result Value Ref Range   Squamous Epithelial / LPF 0-5 (A) NONE SEEN   WBC, UA 0-5 0 - 5 WBC/hpf   RBC / HPF 0-5 0 - 5 RBC/hpf   Bacteria, UA RARE (A) NONE SEEN   Urine-Other YEAST PRESENT   Culture, blood (single)     Status: None (Preliminary result)   Collection Time: 06/01/15  2:32 PM  Result Value Ref Range   Specimen Description BLOOD RIGHT HAND    Special Requests IN PEDIATRIC BOTTLE  4CC    Culture NO GROWTH 2 DAYS    Report Status PENDING   T3     Status: Abnormal   Collection Time: 06/01/15  2:32 PM  Result Value Ref Range   T3, Total 55 (L) 71 - 180 ng/dL    Comment: (NOTE) Performed At: [facility] [place], [place] [id number] Lindon Romp MD OI:[id number]   T4, free     Status: Abnormal   Collection Time: 06/01/15  2:32 PM  Result Value Ref Range   Free T4 1.17 (H) 0.61 - 1.12 ng/dL  Glucose, capillary     Status: Abnormal   Collection Time: 06/01/15  3:31 PM  Result Value Ref Range   Glucose-Capillary 298 (H) 65 - 99 mg/dL    Glucose, capillary     Status: Abnormal   Collection Time: 06/01/15  8:01 PM  Result Value Ref Range   Glucose-Capillary 266 (H) 65 - 99 mg/dL  Glucose, capillary     Status: Abnormal   Collection Time: 06/01/15 11:42 PM  Result Value Ref Range   Glucose-Capillary 209 (H) 65 - 99 mg/dL   Comment 1 Notify RN   Glucose, capillary     Status: Abnormal   Collection Time: 06/02/15  4:02 AM  Result Value Ref Range   Glucose-Capillary 234 (H) 65 - 99 mg/dL   Comment 1 Notify RN  Procalcitonin     Status: None   Collection Time: 06/02/15  5:00 AM  Result Value Ref Range   Procalcitonin 2.98 ng/mL    Comment:        Interpretation: PCT > 2 ng/mL: Systemic infection (sepsis) is likely, unless other causes are known. (NOTE)         ICU PCT Algorithm               Non ICU PCT Algorithm    ----------------------------     ------------------------------         PCT < 0.25 ng/mL                 PCT < 0.1 ng/mL     Stopping of antibiotics            Stopping of antibiotics       strongly encouraged.               strongly encouraged.    ----------------------------     ------------------------------       PCT level decrease by               PCT < 0.25 ng/mL       >= 80% from peak PCT       OR PCT 0.25 - 0.5 ng/mL          Stopping of antibiotics                                             encouraged.     Stopping of antibiotics           encouraged.    ----------------------------     ------------------------------       PCT level decrease by              PCT >= 0.25 ng/mL       < 80% from peak PCT        AND PCT >= 0.5 ng/mL            Continuing antibiotics                                               encouraged.       Continuing antibiotics            encouraged.    ----------------------------     ------------------------------     PCT level increase compared          PCT > 0.5 ng/mL         with peak PCT AND          PCT >= 0.5 ng/mL             Escalation of antibiotics                                           strongly encouraged.      Escalation of antibiotics        strongly encouraged.   CBC     Status: Abnormal   Collection Time: 06/02/15  5:00 AM  Result Value Ref Range   WBC 35.1 (H) 4.0 -  10.5 K/uL    Comment: REPEATED TO VERIFY WHITE COUNT CONFIRMED ON SMEAR    RBC 3.92 3.87 - 5.11 MIL/uL   Hemoglobin 10.8 (L) 12.0 - 15.0 g/dL   HCT 31.7 (L) 36.0 - 46.0 %   MCV 80.9 78.0 - 100.0 fL   MCH 27.6 26.0 - 34.0 pg   MCHC 34.1 30.0 - 36.0 g/dL   RDW 15.8 (H) 11.5 - 15.5 %   Platelets 116 (L) 150 - 400 K/uL    Comment: SPECIMEN CHECKED FOR CLOTS REPEATED TO VERIFY PLATELET COUNT CONFIRMED BY SMEAR   Basic metabolic panel     Status: Abnormal   Collection Time: 06/02/15  5:00 AM  Result Value Ref Range   Sodium 143 135 - 145 mmol/L   Potassium 4.4 3.5 - 5.1 mmol/L    Comment: DELTA CHECK NOTED   Chloride 116 (H) 101 - 111 mmol/L   CO2 19 (L) 22 - 32 mmol/L   Glucose, Bld 257 (H) 65 - 99 mg/dL   BUN 45 (H) 6 - 20 mg/dL   Creatinine, Ser 0.81 0.44 - 1.00 mg/dL   Calcium 8.4 (L) 8.9 - 10.3 mg/dL   GFR calc non Af Amer >60 >60 mL/min   GFR calc Af Amer >60 >60 mL/min    Comment: (NOTE) The eGFR has been calculated using the CKD EPI equation. This calculation has not been validated in all clinical situations. eGFR's persistently <60 mL/min signify possible Chronic Kidney Disease.    Anion gap 8 5 - 15  Glucose, capillary     Status: Abnormal   Collection Time: 06/02/15  8:03 AM  Result Value Ref Range   Glucose-Capillary 204 (H) 65 - 99 mg/dL  Blood gas, arterial     Status: Abnormal   Collection Time: 06/02/15  9:20 AM  Result Value Ref Range   FIO2 0.40    Delivery systems VENTILATOR    Mode PRESSURE REGULATED VOLUME CONTROL    VT 440 mL   LHR 28 resp/min   Peep/cpap 8.0 cm H20   pH, Arterial 7.419 7.350 - 7.450   pCO2 arterial 31.3 (L) 35.0 - 45.0 mmHg   pO2, Arterial 105 (H) 80.0 - 100.0 mmHg   Bicarbonate 19.9 (L) 20.0 - 24.0  mEq/L   TCO2 20.8 0 - 100 mmol/L   Acid-base deficit 3.8 (H) 0.0 - 2.0 mmol/L   O2 Saturation 98.0 %   Patient temperature 98.6    Collection site LEFT RADIAL    Drawn by [id number]    Sample type ARTERIAL DRAW    Allens test (pass/fail) PASS PASS  Glucose, capillary     Status: Abnormal   Collection Time: 06/02/15 11:11 AM  Result Value Ref Range   Glucose-Capillary 174 (H) 65 - 99 mg/dL  Urinalysis, Routine w reflex microscopic (not at [facility])     Status: None   Collection Time: 06/02/15 12:51 PM  Result Value Ref Range   Color, Urine YELLOW YELLOW   APPearance CLEAR CLEAR   Specific Gravity, Urine 1.007 1.005 - 1.030   pH 6.0 5.0 - 8.0   Glucose, UA NEGATIVE NEGATIVE mg/dL   Hgb urine dipstick NEGATIVE NEGATIVE   Bilirubin Urine NEGATIVE NEGATIVE   Ketones, ur NEGATIVE NEGATIVE mg/dL   Protein, ur NEGATIVE NEGATIVE mg/dL   Nitrite NEGATIVE NEGATIVE   Leukocytes, UA NEGATIVE NEGATIVE    Comment: MICROSCOPIC NOT DONE ON URINES WITH NEGATIVE PROTEIN, BLOOD, LEUKOCYTES, NITRITE, OR GLUCOSE <1000 mg/dL.  Culture, blood (routine x  2)     Status: None (Preliminary result)   Collection Time: 06/02/15  1:24 PM  Result Value Ref Range   Specimen Description BLOOD RIGHT ARM    Special Requests BOTTLES DRAWN AEROBIC ONLY 5CC    Culture NO GROWTH < 24 HOURS    Report Status PENDING   Culture, blood (routine x 2)     Status: None (Preliminary result)   Collection Time: 06/02/15  1:26 PM  Result Value Ref Range   Specimen Description BLOOD RIGHT ARM    Special Requests BOTTLES DRAWN AEROBIC ONLY 5CC    Culture NO GROWTH < 24 HOURS    Report Status PENDING   Glucose, capillary     Status: Abnormal   Collection Time: 06/02/15  3:44 PM  Result Value Ref Range   Glucose-Capillary 212 (H) 65 - 99 mg/dL  Glucose, capillary     Status: Abnormal   Collection Time: 06/02/15  7:26 PM  Result Value Ref Range   Glucose-Capillary 218 (H) 65 - 99 mg/dL  Basic metabolic panel     Status: Abnormal    Collection Time: 06/02/15  7:45 PM  Result Value Ref Range   Sodium 141 135 - 145 mmol/L   Potassium 3.6 3.5 - 5.1 mmol/L    Comment: DELTA CHECK NOTED   Chloride 107 101 - 111 mmol/L   CO2 24 22 - 32 mmol/L   Glucose, Bld 231 (H) 65 - 99 mg/dL   BUN 32 (H) 6 - 20 mg/dL   Creatinine, Ser 0.65 0.44 - 1.00 mg/dL   Calcium 8.6 (L) 8.9 - 10.3 mg/dL   GFR calc non Af Amer >60 >60 mL/min   GFR calc Af Amer >60 >60 mL/min    Comment: (NOTE) The eGFR has been calculated using the CKD EPI equation. This calculation has not been validated in all clinical situations. eGFR's persistently <60 mL/min signify possible Chronic Kidney Disease.    Anion gap 10 5 - 15  Magnesium     Status: None   Collection Time: 06/02/15  7:45 PM  Result Value Ref Range   Magnesium 1.8 1.7 - 2.4 mg/dL  Phosphorus     Status: Abnormal   Collection Time: 06/02/15  7:45 PM  Result Value Ref Range   Phosphorus 1.3 (L) 2.5 - 4.6 mg/dL  Glucose, capillary     Status: Abnormal   Collection Time: 06/02/15 11:41 PM  Result Value Ref Range   Glucose-Capillary 197 (H) 65 - 99 mg/dL  I-STAT 3, arterial blood gas (G3+)     Status: Abnormal   Collection Time: 06/03/15  3:43 AM  Result Value Ref Range   pH, Arterial 7.540 (H) 7.350 - 7.450   pCO2 arterial 30.3 (L) 35.0 - 45.0 mmHg   pO2, Arterial 122.0 (H) 80.0 - 100.0 mmHg   Bicarbonate 26.0 (H) 20.0 - 24.0 mEq/L   TCO2 27 0 - 100 mmol/L   O2 Saturation 99.0 %   Acid-Base Excess 4.0 (H) 0.0 - 2.0 mmol/L   Patient temperature 98.0 F    Collection site RADIAL, ALLEN'S TEST ACCEPTABLE    Drawn by RT    Sample type ARTERIAL   Glucose, capillary     Status: Abnormal   Collection Time: 06/03/15  3:43 AM  Result Value Ref Range   Glucose-Capillary 173 (H) 65 - 99 mg/dL  Basic metabolic panel     Status: Abnormal   Collection Time: 06/03/15  4:00 AM  Result Value Ref Range  Sodium 142 135 - 145 mmol/L   Potassium 3.5 3.5 - 5.1 mmol/L   Chloride 109 101 - 111 mmol/L     CO2 24 22 - 32 mmol/L   Glucose, Bld 171 (H) 65 - 99 mg/dL   BUN 30 (H) 6 - 20 mg/dL   Creatinine, Ser 0.57 0.44 - 1.00 mg/dL   Calcium 8.4 (L) 8.9 - 10.3 mg/dL   GFR calc non Af Amer >60 >60 mL/min   GFR calc Af Amer >60 >60 mL/min    Comment: (NOTE) The eGFR has been calculated using the CKD EPI equation. This calculation has not been validated in all clinical situations. eGFR's persistently <60 mL/min signify possible Chronic Kidney Disease.    Anion gap 9 5 - 15  CBC     Status: Abnormal   Collection Time: 06/03/15  4:00 AM  Result Value Ref Range   WBC 28.0 (H) 4.0 - 10.5 K/uL    Comment: WHITE COUNT CONFIRMED ON SMEAR   RBC 4.00 3.87 - 5.11 MIL/uL   Hemoglobin 11.3 (L) 12.0 - 15.0 g/dL   HCT 32.9 (L) 36.0 - 46.0 %   MCV 82.3 78.0 - 100.0 fL   MCH 28.3 26.0 - 34.0 pg   MCHC 34.3 30.0 - 36.0 g/dL   RDW 15.9 (H) 11.5 - 15.5 %   Platelets 103 (L) 150 - 400 K/uL    Comment: PLATELET COUNT CONFIRMED BY SMEAR  Procalcitonin     Status: None   Collection Time: 06/03/15  4:00 AM  Result Value Ref Range   Procalcitonin 1.50 ng/mL    Comment:        Interpretation: PCT > 0.5 ng/mL and <= 2 ng/mL: Systemic infection (sepsis) is possible, but other conditions are known to elevate PCT as well. (NOTE)         ICU PCT Algorithm               Non ICU PCT Algorithm    ----------------------------     ------------------------------         PCT < 0.25 ng/mL                 PCT < 0.1 ng/mL     Stopping of antibiotics            Stopping of antibiotics       strongly encouraged.               strongly encouraged.    ----------------------------     ------------------------------       PCT level decrease by               PCT < 0.25 ng/mL       >= 80% from peak PCT       OR PCT 0.25 - 0.5 ng/mL          Stopping of antibiotics                                             encouraged.     Stopping of antibiotics           encouraged.    ----------------------------      ------------------------------       PCT level decrease by              PCT >= 0.25 ng/mL       <  80% from peak PCT        AND PCT >= 0.5 ng/mL             Continuing antibiotics                                              encouraged.       Continuing antibiotics            encouraged.    ----------------------------     ------------------------------     PCT level increase compared          PCT > 0.5 ng/mL         with peak PCT AND          PCT >= 0.5 ng/mL             Escalation of antibiotics                                          strongly encouraged.      Escalation of antibiotics        strongly encouraged.   Glucose, capillary     Status: Abnormal   Collection Time: 06/03/15  8:04 AM  Result Value Ref Range   Glucose-Capillary 205 (H) 65 - 99 mg/dL      Component Value Date/Time   SDES BLOOD RIGHT ARM 06/02/2015 1326   SPECREQUEST BOTTLES DRAWN AEROBIC ONLY 5CC 06/02/2015 1326   CULT NO GROWTH < 24 HOURS 06/02/2015 1326   REPTSTATUS PENDING 06/02/2015 1326   Mr Brain Wo Contrast  06/03/2015  CLINICAL DATA:  Weakness and altered mental status, diagnosed with flu 4 days ago. RIGHT facial droop and RIGHT arm paralysis. History of Sjogren's disease. EXAM: MRI HEAD WITHOUT CONTRAST TECHNIQUE: Multiplanar, multiecho pulse sequences of the brain and surrounding structures were obtained without intravenous contrast. COMPARISON:  CT head May 30, 2015 FINDINGS: 22 x 11 mm area of reduced diffusion LEFT posterior frontal lobe, precentral gyrus with FLAIR T2 hyperintense signal and low ADC values. No susceptibility artifact to suggest hemorrhage. Local mass effect without midline shift. Ventricles and sulci are normal for patient's age. A few scattered subcentimeter supratentorial white matter FLAIR T2 hyperintensities exclusive of the aforementioned abnormality compatible with mild chronic small vessel ischemic disease. No abnormal extra-axial fluid collections. No extra-axial masses  though, contrast enhanced sequences would be more sensitive. Normal major intracranial vascular flow voids seen at the skull base. Ocular globes and orbital contents are unremarkable though not tailored for evaluation. No abnormal sellar expansion. Moderate paranasal sinus mucosal thickening with scattered air-fluid levels, life-support lines in place. Bilateral mastoid effusions. 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 Mild chronic small vessel ischemic disease. Electronically Signed   By: Elon Alas M.D.   On: 06/03/2015 01:37   Dg Chest Port 1 View  06/03/2015  CLINICAL DATA:  Endotracheal intubation. EXAM: PORTABLE CHEST 1 VIEW COMPARISON:  06/02/2015 FINDINGS: Examination is limited due to patient rotation. Endotracheal tube is present with tip measuring about 2.7 cm above the carina. An enteric tube is present. The tip is off the field of view but is below the left hemidiaphragm. Heart size and pulmonary vascularity are normal. Diffuse airspace disease in the right mid and lower lung  zone likely representing pneumonia. Small right pleural effusion. Mild left perihilar infiltration. No pneumothorax. Right central venous catheter appears to have been removed. IMPRESSION: Appliances appear in satisfactory location. Right mid and lower lung zone infiltrates with small right pleural effusion suggesting pneumonia. Mild left perihilar infiltration. No change since previous study, lying for differences in patient positioning. Electronically Signed   By: Lucienne Capers M.D.   On: 06/03/2015 05:57   Dg Chest Port 1 View  06/02/2015  CLINICAL DATA:  Endotracheal placement EXAM: PORTABLE CHEST 1 VIEW COMPARISON:  Earlier same day FINDINGS: Endotracheal tube has its tip 3 cm above the carina. Soft feeding tube enters the abdomen. Right subclavian central line has its tip in the SVC above the right atrium. Bilateral lower lobe pneumonia right worse than left  persists. Small amount of pleural fluid on the right. No new finding. IMPRESSION: Endotracheal tube well position with its tip 3 cm above the carina. Bilateral lower lobe pneumonia right worse than left. Electronically Signed   By: Nelson Chimes M.D.   On: 06/02/2015 14:49   Dg Chest Port 1 View  06/02/2015  CLINICAL DATA:  Hypoxia/respiratory failure EXAM: PORTABLE CHEST 1 VIEW COMPARISON:  June 01, 2015 FINDINGS: Endotracheal tube tip is 1.3 cm above the carina. Central catheter tip is in the superior vena cava. Feeding tube tip is below the diaphragm. No pneumothorax. There is extensive airspace consolidation through much of the right mid and lower lung zones. There is patchy consolidation in the medial left base. Heart size and pulmonary vascularity are normal. No adenopathy. IMPRESSION: Tube and catheter positions as described without pneumothorax. Persistent extensive consolidation throughout much the right lung. There is patchy airspace consolidation in the medial left base, marginally increased from 1 day prior. No change in cardiac silhouette. Electronically Signed   By: Lowella Grip III M.D.   On: 06/02/2015 07:28   Recent Results (from the past 240 hour(s))  Blood culture (routine x 2)     Status: None   Collection Time: 05/30/15 12:02 AM  Result Value Ref Range Status   Specimen Description BLOOD LEFT ANTECUBITAL  Final   Special Requests BOTTLES DRAWN AEROBIC AND ANAEROBIC 5CCS  Final   Culture  Setup Time   Final    GRAM POSITIVE COCCI IN CHAINS IN BOTH AEROBIC AND ANAEROBIC BOTTLES CRITICAL RESULT CALLED TO, READ BACK BY AND VERIFIED WITH: T BLACKBURN,RN AT 1214 05/30/15 BY L BENFIELD    Culture   Final    STREPTOCOCCUS PYOGENES HEALTH DEPARTMENT NOTIFIED    Report Status 06/02/2015 FINAL  Final   Organism ID, Bacteria STREPTOCOCCUS PYOGENES  Final      Susceptibility   Streptococcus pyogenes - MIC*    CLINDAMYCIN <=0.25 SENSITIVE Sensitive     AMPICILLIN <=0.25  SENSITIVE Sensitive     ERYTHROMYCIN <=0.12 SENSITIVE Sensitive     VANCOMYCIN 0.25 SENSITIVE Sensitive     CEFTRIAXONE <=0.12 SENSITIVE Sensitive     LEVOFLOXACIN 0.5 SENSITIVE Sensitive     * STREPTOCOCCUS PYOGENES  Blood culture (routine x 2)     Status: None   Collection Time: 05/30/15 12:53 AM  Result Value Ref Range Status   Specimen Description BLOOD RIGHT HAND  Final   Special Requests BOTTLES DRAWN AEROBIC ONLY 5CCS  Final   Culture  Setup Time   Final    GRAM POSITIVE COCCI IN CHAINS AEROBIC BOTTLE ONLY CRITICAL RESULT CALLED TO, READ BACK BY AND VERIFIED WITH: T BLACKBURN,RN AT 1301 05/30/15 BY  L BENFIELD    Culture   Final    GROUP A STREP (S.PYOGENES) ISOLATED SUSCEPTIBILITIES PERFORMED ON PREVIOUS CULTURE WITHIN THE LAST 5 DAYS. HEALTH DEPARTMENT NOTIFIED    Report Status 06/02/2015 FINAL  Final  MRSA PCR Screening     Status: None   Collection Time: 05/30/15  4:20 AM  Result Value Ref Range Status   MRSA by PCR NEGATIVE NEGATIVE Final    Comment:        The GeneXpert MRSA Assay (FDA approved for NASAL specimens only), is one component of a comprehensive MRSA colonization surveillance program. It is not intended to diagnose MRSA infection nor to guide or monitor treatment for MRSA infections.   Urine culture     Status: None   Collection Time: 05/30/15  5:58 AM  Result Value Ref Range Status   Specimen Description URINE, CATHETERIZED  Final   Special Requests NONE  Final   Culture NO GROWTH 1 DAY  Final   Report Status 05/31/2015 FINAL  Final  CSF culture     Status: None   Collection Time: 05/30/15  6:38 AM  Result Value Ref Range Status   Specimen Description CSF  Final   Special Requests NONE  Final   Gram Stain   Final    WBC PRESENT, PREDOMINANTLY MONONUCLEAR NO ORGANISMS SEEN CYTOSPIN SMEAR    Culture NO GROWTH 3 DAYS  Final   Report Status 06/02/2015 FINAL  Final  Culture, respiratory (NON-Expectorated)     Status: None (Preliminary result)     Collection Time: 06/01/15 10:35 AM  Result Value Ref Range Status   Specimen Description SPUTUM  Final   Special Requests NONE  Final   Gram Stain   Final    ABUNDANT WBC PRESENT,BOTH PMN AND MONONUCLEAR FEW SQUAMOUS EPITHELIAL CELLS PRESENT NO ORGANISMS SEEN Performed at [facility]    Culture   Final    Culture reincubated for better growth Performed at [facility]    Report Status PENDING  Incomplete  Culture, blood (single)     Status: None (Preliminary result)   Collection Time: 06/01/15  2:32 PM  Result Value Ref Range Status   Specimen Description BLOOD RIGHT HAND  Final   Special Requests IN PEDIATRIC BOTTLE  4CC  Final   Culture NO GROWTH 2 DAYS  Final   Report Status PENDING  Incomplete  Culture, blood (routine x 2)     Status: None (Preliminary result)   Collection Time: 06/02/15  1:24 PM  Result Value Ref Range Status   Specimen Description BLOOD RIGHT ARM  Final   Special Requests BOTTLES DRAWN AEROBIC ONLY 5CC  Final   Culture NO GROWTH < 24 HOURS  Final   Report Status PENDING  Incomplete  Culture, blood (routine x 2)     Status: None (Preliminary result)   Collection Time: 06/02/15  1:26 PM  Result Value Ref Range Status   Specimen Description BLOOD RIGHT ARM  Final   Special Requests BOTTLES DRAWN AEROBIC ONLY 5CC  Final   Culture NO GROWTH < 24 HOURS  Final   Report Status PENDING  Incomplete      06/03/2015, 10:22 AM     LOS: 4 days    Records and images were personally reviewed where available.

## 2015-06-03 NOTE — Progress Notes (Signed)
[name] Progress Note Patient Name: [name] [name] DOB: 1955-11-18 MRN: [id number]   Date of Service  06/03/2015  HPI/Events of Note    eICU Interventions  Hypokalemia, repleted      Intervention Category Intermediate Interventions: Electrolyte abnormality - evaluation and management  Simonne Maffucci 06/03/2015, 9:03 PM

## 2015-06-03 NOTE — Progress Notes (Signed)
Per CCM. Patient not to receive flu or pneumonia vaccines until 5 days after last IVIG.

## 2015-06-03 NOTE — Progress Notes (Addendum)
Name:[name] [name] OTL:[id number] DOB:01-09-1956   ADMISSION DATE: 05/29/2015 CONSULTATION DATE: 05/30/15  REFERRING MD: Dr. Leonides Schanz  CHIEF COMPLAINT: Hypoxic respiratory failure  HISTORY OF PRESENT ILLNESS:  [name] [name] is a 60 y/o woman with a history of Sjogren's disease, hypothyroidism and dysphagia. She was diagnosed with the flu (A) about 4 days ago and started on tamiflu. Being managed with dense PNA, group A strep bacteremia, acute resp failure, and now CVA LFrontal Lobe.     OVERNIGHT EVENTS - Vent 40% FIO2, PEEP 5, Rate 24.  - Atrial flutter overnight with RVR > given lopressor. Resolved.  - PCT 1.5 - MRI with left frontal lobe CVA precentral gyrus.  - No sedation - RASS -4  BP 129/67 mmHg  Pulse 82  Temp(Src) 99.1 F (37.3 C) (Core (Comment))  Resp 24  Ht 5' 2" (1.575 m)  Wt 151 lb 10.8 oz (68.8 kg)  BMI 27.73 kg/m2  SpO2 97%     Recent Labs Lab 06/02/15 0500 06/02/15 1945 06/03/15 0400  NA 143 141 142  K 4.4 3.6 3.5  CL 116* 107 109  CO2 19* 24 24  BUN 45* 32* 30*  CREATININE 0.81 0.65 0.57  GLUCOSE 257* 231* 171*    Recent Labs Lab 06/01/15 1140 06/02/15 0500 06/03/15 0400  HGB 11.0* 10.8* 11.3*  HCT 32.9* 31.7* 32.9*  WBC 40.1* 35.1* PENDING  PLT 121* 116* PENDING   CBG (last 3)   Recent Labs  06/02/15 1926 06/02/15 2341 06/03/15 0343  GLUCAP 218* 197* 173*    ABG    Component Value Date/Time   PHART 7.540* 06/03/2015 0343   PCO2ART 30.3* 06/03/2015 0343   PO2ART 122.0* 06/03/2015 0343   HCO3 26.0* 06/03/2015 0343   TCO2 27 06/03/2015 0343   ACIDBASEDEF 3.8* 06/02/2015 0920   O2SAT 99.0 06/03/2015 0343    Imaging Mr Brain Wo Contrast  06/03/2015  CLINICAL DATA:  Weakness and altered mental status, diagnosed with flu 4 days ago. RIGHT facial droop and RIGHT arm paralysis. History of Sjogren's disease. EXAM: MRI HEAD WITHOUT CONTRAST TECHNIQUE: Multiplanar, multiecho pulse sequences of the brain and  surrounding structures were obtained without intravenous contrast. COMPARISON:  CT head May 30, 2015 FINDINGS: 22 x 11 mm area of reduced diffusion LEFT posterior frontal lobe, precentral gyrus with FLAIR T2 hyperintense signal and low ADC values. No susceptibility artifact to suggest hemorrhage. Local mass effect without midline shift. Ventricles and sulci are normal for patient's age. A few scattered subcentimeter supratentorial white matter FLAIR T2 hyperintensities exclusive of the aforementioned abnormality compatible with mild chronic small vessel ischemic disease. No abnormal extra-axial fluid collections. No extra-axial masses though, contrast enhanced sequences would be more sensitive. Normal major intracranial vascular flow voids seen at the skull base. Ocular globes and orbital contents are unremarkable though not tailored for evaluation. No abnormal sellar expansion. Moderate paranasal sinus mucosal thickening with scattered air-fluid levels, life-support lines in place. Bilateral mastoid effusions. 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 Mild chronic small vessel ischemic disease. Electronically Signed   By: Elon Alas M.D.   On: 06/03/2015 01:37   Dg Chest Port 1 View  06/02/2015  CLINICAL DATA:  Endotracheal placement EXAM: PORTABLE CHEST 1 VIEW COMPARISON:  Earlier same day FINDINGS: Endotracheal tube has its tip 3 cm above the carina. Soft feeding tube enters the abdomen. Right subclavian central line has its tip in the SVC above the right atrium. Bilateral lower lobe pneumonia right worse than left  persists. Small amount of pleural fluid on the right. No new finding. IMPRESSION: Endotracheal tube well position with its tip 3 cm above the carina. Bilateral lower lobe pneumonia right worse than left. Electronically Signed   By: Nelson Chimes M.D.   On: 06/02/2015 14:49   Dg Chest Port 1 View  06/02/2015  CLINICAL DATA:  Hypoxia/respiratory  failure EXAM: PORTABLE CHEST 1 VIEW COMPARISON:  June 01, 2015 FINDINGS: Endotracheal tube tip is 1.3 cm above the carina. Central catheter tip is in the superior vena cava. Feeding tube tip is below the diaphragm. No pneumothorax. There is extensive airspace consolidation through much of the right mid and lower lung zones. There is patchy consolidation in the medial left base. Heart size and pulmonary vascularity are normal. No adenopathy. IMPRESSION: Tube and catheter positions as described without pneumothorax. Persistent extensive consolidation throughout much the right lung. There is patchy airspace consolidation in the medial left base, marginally increased from 1 day prior. No change in cardiac silhouette. Electronically Signed   By: Lowella Grip III M.D.   On: 06/02/2015 07:28   Dg Chest Port 1 View  06/01/2015  CLINICAL DATA:  Bilateral pneumonia. EXAM: PORTABLE CHEST 1 VIEW COMPARISON:  May 30, 2015. FINDINGS: Stable cardiomediastinal silhouette. Endotracheal tube is in grossly good position and unchanged. Interval placement of feeding tube which is seen entering stomach. Right subclavian catheter line is unchanged in position. No pneumothorax is noted. Stable large right lung pneumonia is noted. Stable minimal opacity is seen in left lung base. IMPRESSION: Stable large right lower lobe pneumonia. Endotracheal tube and right subclavian catheter are unchanged. Interval placement of feeding tube seen entering stomach Electronically Signed   By: Marijo Conception, M.D.   On: 06/01/2015 09:30   STUDIES:  Head CT 2/12 >> no acute hemorrhagic infarct.  Chest CT 2/12 >> ground glass opacities, consolidation c/w pneumonia.  CXR 2/14 / 2/15 >> little change. R lung consolidation, ? L infiltrate.  CXR 2/16 >> improving consolidation on the right MRI Head 2/15 > L Frontal precentral gyrus CVA  CULTURES: Blood 2/12>>>Group A Strep pan sensitive.  Blood Cx 2/15 >> pending Blood Cx 2/14 >> NG  x 24 hrs Urine 2/12>> negative.  CSF >> Negative  Resp 2/14 > No organisms on gram stain, culture pending  ANTIBIOTICS: Vanc 2/12>>> 2/15 aztreonam  2/12>>> 2/14 Acyclovir>>>2/12  levaquin 2/12->> 2/15 Oseltamavir 2/13 >> MEJII 2/15 >> Ceftriaxone 2/15 >>  SIGNIFICANT EVENTS: Intubated in ED Shock Refractory hypoxia 2/13 > weaning Vent overnight.  2/15 > CVA on MRI  LINES/TUBES: Right subclavian CVC 2/12>> 2/15 Foley 2/12>> ETT 2/12>> A line 2/12>>>2/14   Physical Exam:  General: remains unresponsive off sedation, pERRLA Neuro:  intubated, cough +, gag +, diminished patellar reflex on the L HEENT:jvd, ETT, NG tube PULM: coarse throughout L>R. Improving.  CV: s1 s2 RRT, No MGR GI: soft, bs wnl, no r/g Extremities: no edema, boots in place.    DISCUSSION: 60 y/o with influenza A and right sided pneumonia in significant respiratory distress, intubated for hypoxic respiratory failure.   ASSESSMENT / PLAN:  PULMONARY A: Pneumonia (necrotizing?) Dense infiltrate resp alk mild P:  Lung protective ventilation Left lung down  Avoid auto peep  40% peep 5 - Rate to 20, reduce rate 12 ABG at 1 pm.  Attempt Wean today, cpap 5 ps 5, goal 2 hr  ABG this am with alkalosis Goal fluid balance negative.  - 4L yesterday.  pcxr daily.  ABX  per ID section.  Neg balance  CARDIOVASCULAR A:  Septic shock - resolved SVT resolved from levophed adequate cortisol response Hypotension - not on pressors.  H./o afib? > a flutter overnight with RVR.  P:  Fluids off.  Lopressor prn . Lasix to neg May need amio  RENAL A:  AKI. Hyponatremia - resolved NONAG met acidosis- saline   Recent Labs Lab 06/02/15 0500 06/02/15 1945 06/03/15 0400  NA 143 141 142    P:  Lasix to reduce Avoid saline 1/2 NS KVO kvo K repleted.  Phos, mag supp  GASTROINTESTINAL A:  Question of esophageal stricture, sjorgens P:  Cortrak placed yesterday. Tube feeds to  goal Protonix.  LFT normal.   INFECTIOUS A:  Pneumonia (r/o different organism than group A strep ) Flu A Meningitis ruled out.  Septic shock  P:  Would prefer pcn G but concern other organism for lung tamiflu x 7 days Ceftriaxone Total 5 days Abx so far.  Continued neb Aminoglycoside study drug  Endocrine: A: Hypothyroidism, c/w sick euthryoid P:  IV synthroid ordered, keep same Treat underlying condition repeat T3 T4 in 2 months  NEUROLOGIC A:  Severe hypoxia requiring paralysis On presentation weakness CVA on MRI Brain c/w right sided deficits.  Remains deep  P:  RASS goal:0 MRI reviewed EEG spike activity from left frontal lobe > cva area ASA to plavix per stoke Statin Carotid dopplers when able Echo pending > embolic? TEE?  Discuss with neuro.  Considering Heparin.    FAMILY  - Updates: husband daily  - Inter-disciplinary family meet or Palliative Care meeting due by: day [place], MD Family Medicine -PGY 2  STAFF NOTE: I, Merrie Roof, MD FACP have personally reviewed patient's available data, including medical history, events of note, physical examination and test results as part of my evaluation. I have discussed with resident/NP and other care providers such as pharmacist, RN and RRT. In addition, I personally evaluated patient and elicited key findings of: moving rll thi sam to pain, still poorly responsive but i am optimistic as she is more arousal and openeing eyes to pain, lung sounds improved, MRI reviewed, ABg reviewed, reduce MV, repeat abg, goal weaning today cpap 5 ps5, goal 2 hr, unable to extubate until neuro improves, lasix reduction slight to neg 2 liters goal, tolerated well, k, phos, mag supp , she was in /out flutter last night, d/w neuro no heparin for now, I have concerns septic emboli, repeat bc sent, echo important to ro veg ,a though would be rare with this organism, may bneed tee, control temps with  stroke, goal 36-37 degrees, WBC, pct all downward, i will update husband, maintain MEIJI neb abx as we were not convinced group a strep would cause this infiltrate, sjorgens , concern esophogeal issue s- leading to asp as cause of infiltrate, maintain ceftraixone, consider TEE but may need egd prior by GI to allow safety, redose IVIG  Then dc further The patient is critically ill with multiple organ systems failure and requires high complexity decision making for assessment and support, frequent evaluation and titration of therapies, application of advanced monitoring technologies and extensive interpretation of multiple databases.   Critical Care Time devoted to patient care services described in this note is35 Minutes. This time reflects time of care of this signee: Merrie Roof, MD FACP. This critical care time does not reflect procedure time, or teaching time or supervisory time of PA/NP/Med student/Med Resident etc  but could involve care discussion time. Rest per NP/medical resident whose note is outlined above and that I agree with   Lavon Paganini. Titus Mould, MD, [place] Pgr: [facility] 06/03/2015 9:02 AM

## 2015-06-03 NOTE — Progress Notes (Addendum)
STROKE TEAM PROGRESS NOTE   HISTORY OF PRESENT ILLNESS [name] [name] is an 60 y.o. female patient who Has been sick with influenza symptoms for the past 4-5 Days with worsening generalized weakness. Poor by mouth intake, Per her husband. Today 05/29/2015 around 8:30 PM when she got up to go to bathroom she had give severe gait instability and couldn't stand up due to generalized weakness. She'll brought into the ER by EMS for further evaluation. Was diagnosed with flu yesterday and started on Tamiflu. History of multiple autoimmune conditions with Sjgren's, rheumatoid arthritis, on plaquenil and prednisone at home. Patient with severe restrictive distress when she presented to the ER with oxygen saturation in 70s while on nonrebreathing mask. With 100% BiPAP, her O2 sats improved to 91%, Continues to be in severe distress. She was last known well unclear, thought to be around Feb 6-7.  Patient was not administered TPA secondary to clinically not suggestive of acute stroke on presentation, last normal greater than 4-5 days ago. She was admitted for further evaluation and treatment.  Neurology consulted day of arrival 05/29/2015 . MRI completed 06/03/2015 and transferred to the stroke service for ongoing care.   SUBJECTIVE (INTERVAL HISTORY) Her husband, who is a [employer] park manager, is at the bedside.  He stated that pt was not well since last week with fever and flu, not eating well. But last Saturday she had generalized weakness and really declined, not able to stand up after bathroom. Developed respiratory failure and was intubated in ER. She had SVT in ER and was found to have aflutter last night on EEG. She has sjogren's disease and is on plaquenil and steroids at home. She has no hx of seizure or other significant PMH as per husband.    OBJECTIVE Temp:  [98.6 F (37 C)-99.3 F (37.4 C)] 99.1 F (37.3 C) (02/16 0600) Pulse Rate:  [65-124] 74 (02/16 0811) Cardiac Rhythm:  [-] Normal sinus rhythm  (02/16 0400) Resp:  [20-28] 24 (02/16 0811) BP: (96-146)/(56-88) 142/69 mmHg (02/16 0811) SpO2:  [89 %-99 %] 94 % (02/16 0811) FiO2 (%):  [40 %] 40 % (02/16 0811) Weight:  [68.8 kg (151 lb 10.8 oz)] 68.8 kg (151 lb 10.8 oz) (02/16 0352)  CBC:  Recent Labs Lab 05/30/15 0500  06/01/15 1140 06/02/15 0500 06/03/15 0400  WBC 13.8*  < > 40.1* 35.1* 28.0*  NEUTROABS 12.7*  --  35.3*  --   --   HGB 13.6  < > 11.0* 10.8* 11.3*  HCT 40.9  < > 32.9* 31.7* 32.9*  MCV 85.9  < > 83.5 80.9 82.3  PLT 266  < > 121* 116* 103*  < > = values in this interval not displayed.  Basic Metabolic Panel:  Recent Labs Lab 05/30/15 1328  06/01/15 1140  06/02/15 1945 06/03/15 0400  NA  --   < > 142  < > 141 142  K  --   < > 3.4*  < > 3.6 3.5  CL  --   < > 114*  < > 107 109  CO2  --   < > 19*  < > 24 24  GLUCOSE  --   < > 310*  < > 231* 171*  BUN  --   < > 39*  < > 32* 30*  CREATININE  --   < > 0.82  < > 0.65 0.57  CALCIUM  --   < > 8.3*  < > 8.6* 8.4*  MG 2.3  --  2.9*  --  1.8  --   PHOS 2.5  --   --   --  1.3*  --   < > = values in this interval not displayed.  Lipid Panel: No results found for: CHOL, TRIG, HDL, CHOLHDL, VLDL, LDLCALC HgbA1c: No results found for: HGBA1C Urine Drug Screen:    Component Value Date/Time   LABOPIA POSITIVE* 05/30/2015 0558   COCAINSCRNUR NONE DETECTED 05/30/2015 0558   LABBENZ NONE DETECTED 05/30/2015 0558   AMPHETMU NONE DETECTED 05/30/2015 0558   THCU NONE DETECTED 05/30/2015 0558   LABBARB NONE DETECTED 05/30/2015 0558      IMAGING I have personally reviewed the radiological images below and agree with the radiology interpretations.  Mr Brain Wo Contrast 06/03/2015   Acute small LEFT frontal lobe (precentral gyrus, MCA territory) infarct. Mild chronic small vessel ischemic disease.   2D Echocardiogram  - Left ventricle: The cavity size was normal. Systolic function wasnormal. The estimated ejection fraction was in the range of 60%to 65%. Wall motion  was normal; there were no regional wallmotion abnormalities. - Mitral valve: There was mild regurgitation. - Tricuspid valve: There was mild regurgitation. - Pulmonic valve: Poorly visualized. - Pulmonary arteries: PA peak pressure: 35 mm Hg (S). Impressions:  - There was no evidence of a vegetation.  CTA head and neck -  1. 7 mm filling defect in the distal left common carotid artery consistent with a small focus of thrombus. 2. Otherwise unremarkable appearance of the cervical carotid and vertebral arteries. 3. No major intracranial arterial branch occlusion or significant stenosis. Slight attenuation of distal left MCA branch vessels in the region of the left frontal infarct. 4. Extensive right lung consolidation, incompletely visualized. Small right pleural effusion.  TEE - pending   Physical exam  Temp:  [98.1 F (36.7 C)-99.7 F (37.6 C)] 98.1 F (36.7 C) (02/16 1500) Pulse Rate:  [65-118] 66 (02/16 1510) Resp:  [20-32] 20 (02/16 1500) BP: (102-154)/(61-89) 126/69 mmHg (02/16 1510) SpO2:  [90 %-100 %] 94 % (02/16 1510) FiO2 (%):  [40 %] 40 % (02/16 1510) Weight:  [151 lb 10.8 oz (68.8 kg)] 151 lb 10.8 oz (68.8 kg) (02/16 0352)  General - Well nourished, well developed, intubated on sedation.  Ophthalmologic - Fundi not visualized due to positioning and ET tube.  Cardiovascular - Regular rate and rhythm.  Neuro - intubated on sedation, not responsive. Not open eyes on pain stimulation. PERRL, slight doll's eyes, but positive corneal and gag. Breathing over the vent. On pain stimulation, slight withdraw BLE, but not BUE. Right UE decreased muscle tone, and LUE increased muscle tone. Babinski negative. Sensation, coordination or gait not tested.   ASSESSMENT/PLAN [name] is a 60 y.o. female with history of Sjogren's disease, seizure, hypothyroidism and dysphagia, diagnosed with the flu 4 days prior to arrival, presenting with weakness and altered mental  status. Code stroke called on arrival due to right facial droop and flaccid paralysis R arm. In the ED she was found to have hypooxic respiratory failure. LP negative for CNS infection. MRI showed left MCA embolic stroke.  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 Endocarditis vs. Aflutter/afib vs. hyercoagulable state due to severe infection. Need to rule out antiphospholipid syndrome in the future.  Resultant    MRI  small L frontal lobe MCA infart  CTA head and neck - left CCA thrombus, no mycotic aneurysm.  2D Echo  No SOE, vegetation  TEE to be scheduled to eval for endocarditis,  discussed with Dr. Titus Mould  EEG spike activity from the left frontal lobe, likely associated with current stroke  LDL pending  HgbA1c pending  Heparin IV for VTE prophylaxis  Diet NPO time specified  No antithrombotic prior to admission, now on clopidogrel 75 mg daily. Due to CCA thrombus, aflutter and no mycotic aneurysm seen, would recommend to start heparin drip for anticoagulation.   Ongoing aggressive stroke risk factor management  Therapy recommendations:  pending   Disposition:  pending   Respiratory Failure Shock Refractory hypoxia Pneumonia / FLU  Intubated on admission in ED  CTA neck showed extensive right LL consolidation  Flu diagnosis 4 days prior to admission, started on Tamiflu at that time  On rocephin and tamiflu  Atrial Flutter  EKG shows atrial flutter with variable A-V block 2/15 around 8p  Start heparin drip for anticoagulation in the setting of left CCA thrombus.  SVT on admission   ? Endocarditis  Will recommend TEE ASAP to rule out  No mycotic aneurysm seen on CTA head  Blood cultures 2/12 Group a strep pan sensitive, 2/14 neg x 48h, 2/15 pending  Will start heparin drip as no mytoctic aneurysm, and culture more than 48 hours no growth  Sjogren syndrome  On plaquenil and steroids at home, currently off  Hypercoagulable  and autoimmune work up to rule out antiphospholipid syndrome.  Hypotension, resolved Hx hypertension  Stable  Hyperlipidemia  Home meds:  No statin  Now on lipitor 40 mg daily  LDL pending  LDL goal < 70  Continue statin at discharge  Other Stroke Risk Factors  Advanced age  Other Active Problems  Hypothyroidism  Hx Dysphagia prior to admission  AKI and hyponatremia resolved  ? Esophageal stricture secondary to Sjogren's  Raynaud's   [facility] day # 4  This patient is critically ill due to left MCA stroke, respiratory failure and left CCA thrombus and at significant risk of neurological worsening, death form recurrent stroke, hemorrhagic transformation, cardiac failure, sepsis, DIC. This patient's care requires constant monitoring of vital signs, hemodynamics, respiratory and cardiac monitoring, review of multiple databases, neurological assessment, discussion with family, other specialists and medical decision making of high complexity. I spent 50 minutes of neurocritical care time in the care of this patient.  I had long discussion with husband at bedside regarding the diagnosis of stroke, further work up and the findings of left CCA thrombus. Decision about anticoagulation and its benefit and risk also discussed with him and he agrees with this treatment.   [name] Hawking, MD PhD Stroke Neurology 06/03/2015 5:01 PM    To contact Stroke Continuity provider, please refer to [email or web address] After hours, contact General Neurology

## 2015-06-04 ENCOUNTER — Inpatient Hospital Stay (HOSPITAL_COMMUNITY): Payer: BC Managed Care – PPO

## 2015-06-04 DIAGNOSIS — IMO0002 Reserved for concepts with insufficient information to code with codable children: Secondary | ICD-10-CM | POA: Insufficient documentation

## 2015-06-04 DIAGNOSIS — R400 Somnolence: Secondary | ICD-10-CM

## 2015-06-04 DIAGNOSIS — I6529 Occlusion and stenosis of unspecified carotid artery: Secondary | ICD-10-CM | POA: Diagnosis present

## 2015-06-04 DIAGNOSIS — Z0000 Encounter for general adult medical examination without abnormal findings: Secondary | ICD-10-CM

## 2015-06-04 DIAGNOSIS — E785 Hyperlipidemia, unspecified: Secondary | ICD-10-CM | POA: Diagnosis present

## 2015-06-04 DIAGNOSIS — Z9911 Dependence on respirator [ventilator] status: Secondary | ICD-10-CM | POA: Diagnosis present

## 2015-06-04 LAB — CULTURE, RESPIRATORY W GRAM STAIN

## 2015-06-04 LAB — PROCALCITONIN: Procalcitonin: 0.83 ng/mL

## 2015-06-04 LAB — POCT I-STAT 3, ART BLOOD GAS (G3+)
ACID-BASE EXCESS: 5 mmol/L — AB (ref 0.0–2.0)
Acid-base deficit: 2 mmol/L (ref 0.0–2.0)
BICARBONATE: 27.9 meq/L — AB (ref 20.0–24.0)
Bicarbonate: 24 mEq/L (ref 20.0–24.0)
O2 SAT: 88 %
O2 Saturation: 99 %
PCO2 ART: 33.8 mmHg — AB (ref 35.0–45.0)
PCO2 ART: 45.6 mmHg — AB (ref 35.0–45.0)
PO2 ART: 105 mmHg — AB (ref 80.0–100.0)
Patient temperature: 98.8
TCO2: 29 mmol/L (ref 0–100)
TCO2: 25 mmol/L (ref 0–100)
pH, Arterial: 7.526 — ABNORMAL HIGH (ref 7.350–7.450)
pH, Arterial: 7.327 — ABNORMAL LOW (ref 7.350–7.450)
pO2, Arterial: 58 mmHg — ABNORMAL LOW (ref 80.0–100.0)

## 2015-06-04 LAB — BASIC METABOLIC PANEL
ANION GAP: 8 (ref 5–15)
BUN: 22 mg/dL — ABNORMAL HIGH (ref 6–20)
CALCIUM: 8.2 mg/dL — AB (ref 8.9–10.3)
CHLORIDE: 106 mmol/L (ref 101–111)
CO2: 25 mmol/L (ref 22–32)
Creatinine, Ser: 0.49 mg/dL (ref 0.44–1.00)
GFR calc Af Amer: >60 mL/min (ref >60)
GFR calc non Af Amer: >60 mL/min (ref >60)
GLUCOSE: 190 mg/dL — AB (ref 65–99)
Potassium: 3.7 mmol/L (ref 3.5–5.1)
Sodium: 139 mmol/L (ref 135–145)

## 2015-06-04 LAB — GLUCOSE, CAPILLARY
GLUCOSE-CAPILLARY: 188 mg/dL — AB (ref 65–99)
GLUCOSE-CAPILLARY: 93 mg/dL (ref 65–99)
GLUCOSE-CAPILLARY: 104 mg/dL — AB (ref 65–99)
GLUCOSE-CAPILLARY: 101 mg/dL — AB (ref 65–99)
Glucose-Capillary: 176 mg/dL — ABNORMAL HIGH (ref 65–99)
Glucose-Capillary: 187 mg/dL — ABNORMAL HIGH (ref 65–99)

## 2015-06-04 LAB — BLOOD GAS, ARTERIAL
ACID-BASE EXCESS: 3.9 mmol/L — AB (ref 0.0–2.0)
Bicarbonate: 26.9 mEq/L — ABNORMAL HIGH (ref 20.0–24.0)
DRAWN BY: 331761
FIO2: 0.4
LHR: 12 {breaths}/min
MECHVT: 300 mL
O2 Saturation: 96.5 %
PATIENT TEMPERATURE: 99.1
PCO2 ART: 34.1 mmHg — AB (ref 35.0–45.0)
PEEP/CPAP: 5 cmH2O
PO2 ART: 76.5 mmHg — AB (ref 80.0–100.0)
TCO2: 27.9 mmol/L (ref 0–100)
pH, Arterial: 7.51 — ABNORMAL HIGH (ref 7.350–7.450)

## 2015-06-04 LAB — CBC
HEMATOCRIT: 33.3 % — AB (ref 36.0–46.0)
HEMOGLOBIN: 11.1 g/dL — AB (ref 12.0–15.0)
MCH: 27.8 pg (ref 26.0–34.0)
MCHC: 33.3 g/dL (ref 30.0–36.0)
MCV: 83.5 fL (ref 78.0–100.0)
Platelets: 122 10*3/uL — ABNORMAL LOW (ref 150–400)
RBC: 3.99 MIL/uL (ref 3.87–5.11)
RDW: 16 % — AB (ref 11.5–15.5)
WBC: 22.6 10*3/uL — ABNORMAL HIGH (ref 4.0–10.5)

## 2015-06-04 LAB — HEPARIN LEVEL (UNFRACTIONATED)
HEPARIN UNFRACTIONATED: 0.74 [IU]/mL — AB (ref 0.30–0.70)
HEPARIN UNFRACTIONATED: 0.67 [IU]/mL (ref 0.30–0.70)
Heparin Unfractionated: 0.68 IU/mL (ref 0.30–0.70)

## 2015-06-04 LAB — CULTURE, RESPIRATORY

## 2015-06-04 MED ORDER — POTASSIUM CHLORIDE 20 MEQ/15ML (10%) PO SOLN
40.0000 meq | Freq: Once | ORAL | Status: AC
  Administered 2015-06-04: 40 meq via ORAL
  Filled 2015-06-04: qty 30

## 2015-06-04 MED ORDER — ETOMIDATE 2 MG/ML IV SOLN
INTRAVENOUS | Status: AC
Start: 2015-06-04 — End: 2015-06-04
  Filled 2015-06-04: qty 10

## 2015-06-04 MED ORDER — ETOMIDATE 2 MG/ML IV SOLN
10.0000 mg | Freq: Once | INTRAVENOUS | Status: AC
  Administered 2015-06-04: 10 mg via INTRAVENOUS

## 2015-06-04 MED ORDER — SODIUM CHLORIDE 0.9 % IV SOLN: INTRAVENOUS | Status: DC

## 2015-06-04 NOTE — Progress Notes (Signed)
INFECTIOUS DISEASE PROGRESS NOTE  ID: [name] [name] is a 60 y.o. female with  Active Problems:   Sepsis ([place])   CAP (community acquired pneumonia)   Encephalopathy   Encounter for central line placement   Hypoxia   Lactic acidosis   Altered mental state   Weakness   Encounter for feeding tube placement   Endotracheal tube present   Endotracheally intubated   Respiratory failure ([place])   Stroke (cerebrum) ([place])   Research study patient  Subjective: On vent  Abtx:  Anti-infectives    Start     Dose/Rate Route Frequency Ordered Stop   06/03/15 1045  cefTRIAXone (ROCEPHIN) 2 g in dextrose 5 % 50 mL IVPB  Status:  Discontinued     2 g 100 mL/hr over 30 Minutes Intravenous Every 24 hours 06/03/15 0924 06/03/15 0932   06/03/15 1000  cefTRIAXone (ROCEPHIN) 2 g in dextrose 5 % 50 mL IVPB     2 g 100 mL/hr over 30 Minutes Intravenous Every 12 hours 06/03/15 0932     06/02/15 1045  cefTRIAXone (ROCEPHIN) 1 g in dextrose 5 % 50 mL IVPB  Status:  Discontinued     1 g 100 mL/hr over 30 Minutes Intravenous Every 24 hours 06/02/15 1040 06/03/15 0924   06/01/15 0000  vancomycin (VANCOCIN) IVPB 1000 mg/200 mL premix  Status:  Discontinued     1,000 mg 200 mL/hr over 60 Minutes Intravenous Every 12 hours 05/31/15 1340 06/02/15 1040   05/31/15 2200  oseltamivir (TAMIFLU) 6 MG/ML suspension 150 mg  Status:  Discontinued     150 mg Per Tube 2 times daily 05/31/15 1111 05/31/15 1421   05/31/15 1430  oseltamivir (TAMIFLU) 6 MG/ML suspension 150 mg     150 mg Per Tube 2 times daily 05/31/15 1421 06/07/15 0959   05/31/15 1400  clindamycin (CLEOCIN) IVPB 600 mg     600 mg 100 mL/hr over 30 Minutes Intravenous 3 times per day 05/31/15 1335 06/01/15 2202   05/30/15 2300  levofloxacin (LEVAQUIN) IVPB 750 mg  Status:  Discontinued     750 mg 100 mL/hr over 90 Minutes Intravenous Every 24 hours 05/30/15 0745 06/02/15 1040   05/30/15 1300  oseltamivir (TAMIFLU) 6 MG/ML suspension 75 mg  Status:   Discontinued     75 mg Per Tube 2 times daily 05/30/15 1252 05/31/15 1111   05/30/15 1200  vancomycin (VANCOCIN) 500 mg in sodium chloride 0.9 % 100 mL IVPB  Status:  Discontinued     500 mg 100 mL/hr over 60 Minutes Intravenous Every 12 hours 05/30/15 0745 05/31/15 1340   05/30/15 0800  acyclovir (ZOVIRAX) 500 mg in dextrose 5 % 100 mL IVPB  Status:  Discontinued     500 mg 110 mL/hr over 60 Minutes Intravenous 3 times per day 05/30/15 0725 05/30/15 1245   05/30/15 0800  aztreonam (AZACTAM) 2 g in dextrose 5 % 50 mL IVPB  Status:  Discontinued     2 g 100 mL/hr over 30 Minutes Intravenous Every 8 hours 05/30/15 0745 05/31/15 1331   05/30/15 0030  acyclovir (ZOVIRAX) 500 mg in dextrose 5 % 100 mL IVPB  Status:  Discontinued     500 mg 110 mL/hr over 60 Minutes Intravenous  Once 05/30/15 0016 05/30/15 0902   05/30/15 0015  levofloxacin (LEVAQUIN) IVPB 750 mg     750 mg 100 mL/hr over 90 Minutes Intravenous  Once 05/30/15 0001 05/30/15 0232   05/30/15 0015  aztreonam (AZACTAM) 2 g  in dextrose 5 % 50 mL IVPB     2 g 100 mL/hr over 30 Minutes Intravenous  Once 05/30/15 0001 05/30/15 0115   05/30/15 0015  vancomycin (VANCOCIN) IVPB 1000 mg/200 mL premix     1,000 mg 200 mL/hr over 60 Minutes Intravenous  Once 05/30/15 0001 05/30/15 0232      Medications:  Scheduled: . antiseptic oral rinse  7 mL Mouth Rinse 10 times per day  . atorvastatin  40 mg Oral q1800  . cefTRIAXone (ROCEPHIN)  IV  2 g Intravenous Q12H  . chlorhexidine gluconate  15 mL Mouth Rinse BID  . famotidine (PEPCID) IV  20 mg Intravenous Q12H  . furosemide  10 mg Intravenous BID  . insulin aspart  0-15 Units Subcutaneous 6 times per day  . insulin glargine  10 Units Subcutaneous Daily  . levothyroxine  50 mcg Intravenous Daily  . ME1100 Study - arbekacin 150 mg/mL for inhalation  300 mg Inhalation BID  . oseltamivir  150 mg Per Tube BID  . potassium chloride  40 mEq Oral Daily  . potassium chloride  40 mEq Oral Once      Objective: Vital signs in last 24 hours: Temp:  [98.1 F (36.7 C)-99.7 F (37.6 C)] 98.7 F (37.1 C) (02/17 0800) Pulse Rate:  [66-94] 86 (02/17 0906) Resp:  [18-32] 27 (02/17 0906) BP: (116-145)/(60-89) 132/70 mmHg (02/17 0906) SpO2:  [88 %-100 %] 97 % (02/17 0906) FiO2 (%):  [30 %-50 %] 40 % (02/17 0906) Weight:  [69.9 kg (154 lb 1.6 oz)] 69.9 kg (154 lb 1.6 oz) (02/17 0500)   General appearance: no distress Resp: rhonchi bilaterally Cardio: regular rate and rhythm GI: abnormal findings:  hypoactive bowel sounds Extremities: anasarca  Lab Results  Recent Labs  06/03/15 0400 06/03/15 1830 06/04/15 0035  WBC 28.0*  --  22.6*  HGB 11.3*  --  11.1*  HCT 32.9*  --  33.3*  NA 142 136 139  K 3.5 3.2* 3.7  CL 109 103 106  CO2 24 26 25   BUN 30* 22* 22*  CREATININE 0.57 0.59 0.49   Liver Panel  Recent Labs  06/01/15 1140  PROT 4.2*  ALBUMIN 1.4*  AST 39  ALT 27  ALKPHOS 83  BILITOT 1.2  BILIDIR 0.8*   Sedimentation Rate No results for input(s): ESRSEDRATE in the last 72 hours. C-Reactive Protein No results for input(s): CRP in the last 72 hours.  Microbiology: Recent Results (from the past 240 hour(s))  Blood culture (routine x 2)     Status: None   Collection Time: 05/30/15 12:02 AM  Result Value Ref Range Status   Specimen Description BLOOD LEFT ANTECUBITAL  Final   Special Requests BOTTLES DRAWN AEROBIC AND ANAEROBIC 5CCS  Final   Culture  Setup Time   Final    GRAM POSITIVE COCCI IN CHAINS IN BOTH AEROBIC AND ANAEROBIC BOTTLES CRITICAL RESULT CALLED TO, READ BACK BY AND VERIFIED WITH: T BLACKBURN,RN AT 1214 05/30/15 BY L BENFIELD    Culture   Final    STREPTOCOCCUS PYOGENES HEALTH DEPARTMENT NOTIFIED    Report Status 06/02/2015 FINAL  Final   Organism ID, Bacteria STREPTOCOCCUS PYOGENES  Final      Susceptibility   Streptococcus pyogenes - MIC*    CLINDAMYCIN <=0.25 SENSITIVE Sensitive     AMPICILLIN <=0.25 SENSITIVE Sensitive      ERYTHROMYCIN <=0.12 SENSITIVE Sensitive     VANCOMYCIN 0.25 SENSITIVE Sensitive     CEFTRIAXONE <=0.12 SENSITIVE Sensitive  LEVOFLOXACIN 0.5 SENSITIVE Sensitive     * STREPTOCOCCUS PYOGENES  Blood culture (routine x 2)     Status: None   Collection Time: 05/30/15 12:53 AM  Result Value Ref Range Status   Specimen Description BLOOD RIGHT HAND  Final   Special Requests BOTTLES DRAWN AEROBIC ONLY 5CCS  Final   Culture  Setup Time   Final    GRAM POSITIVE COCCI IN CHAINS AEROBIC BOTTLE ONLY CRITICAL RESULT CALLED TO, READ BACK BY AND VERIFIED WITH: T BLACKBURN,RN AT 1301 05/30/15 BY L BENFIELD    Culture   Final    GROUP A STREP (S.PYOGENES) ISOLATED SUSCEPTIBILITIES PERFORMED ON PREVIOUS CULTURE WITHIN THE LAST 5 DAYS. HEALTH DEPARTMENT NOTIFIED    Report Status 06/02/2015 FINAL  Final  MRSA PCR Screening     Status: None   Collection Time: 05/30/15  4:20 AM  Result Value Ref Range Status   MRSA by PCR NEGATIVE NEGATIVE Final    Comment:        The GeneXpert MRSA Assay (FDA approved for NASAL specimens only), is one component of a comprehensive MRSA colonization surveillance program. It is not intended to diagnose MRSA infection nor to guide or monitor treatment for MRSA infections.   Urine culture     Status: None   Collection Time: 05/30/15  5:58 AM  Result Value Ref Range Status   Specimen Description URINE, CATHETERIZED  Final   Special Requests NONE  Final   Culture NO GROWTH 1 DAY  Final   Report Status 05/31/2015 FINAL  Final  CSF culture     Status: None   Collection Time: 05/30/15  6:38 AM  Result Value Ref Range Status   Specimen Description CSF  Final   Special Requests NONE  Final   Gram Stain   Final    WBC PRESENT, PREDOMINANTLY MONONUCLEAR NO ORGANISMS SEEN CYTOSPIN SMEAR    Culture NO GROWTH 3 DAYS  Final   Report Status 06/02/2015 FINAL  Final  Culture, respiratory (NON-Expectorated)     Status: None (Preliminary result)   Collection Time:  06/01/15 10:35 AM  Result Value Ref Range Status   Specimen Description SPUTUM  Final   Special Requests NONE  Final   Gram Stain   Final    ABUNDANT WBC PRESENT,BOTH PMN AND MONONUCLEAR FEW SQUAMOUS EPITHELIAL CELLS PRESENT NO ORGANISMS SEEN Performed at [facility]    Culture   Final    Culture reincubated for better growth Performed at [facility]    Report Status PENDING  Incomplete  Culture, blood (single)     Status: None (Preliminary result)   Collection Time: 06/01/15  2:32 PM  Result Value Ref Range Status   Specimen Description BLOOD RIGHT HAND  Final   Special Requests IN PEDIATRIC BOTTLE  4CC  Final   Culture NO GROWTH 2 DAYS  Final   Report Status PENDING  Incomplete  Culture, blood (routine x 2)     Status: None (Preliminary result)   Collection Time: 06/02/15  1:24 PM  Result Value Ref Range Status   Specimen Description BLOOD RIGHT ARM  Final   Special Requests BOTTLES DRAWN AEROBIC ONLY 5CC  Final   Culture NO GROWTH 1 DAY  Final   Report Status PENDING  Incomplete  Culture, blood (routine x 2)     Status: None (Preliminary result)   Collection Time: 06/02/15  1:26 PM  Result Value Ref Range Status   Specimen Description BLOOD RIGHT ARM  Final   Special Requests BOTTLES DRAWN AEROBIC ONLY 5CC  Final   Culture NO GROWTH 1 DAY  Final   Report Status PENDING  Incomplete    Studies/Results: Ct Angio Head W/cm &/or Wo Cm  06/03/2015  CLINICAL DATA:  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 Right facial droop and right arm paralysis. History of Sjogren's disease. EXAM: CT ANGIOGRAPHY HEAD AND NECK TECHNIQUE: Multidetector CT imaging of the head and neck was performed using the standard protocol during bolus administration of intravenous contrast. Multiplanar CT image reconstructions and MIPs were obtained to evaluate the vascular anatomy. Carotid stenosis measurements (when applicable) are obtained utilizing NASCET criteria, using the  distal internal carotid diameter as the denominator. CONTRAST:  54mL OMNIPAQUE IOHEXOL 350 MG/ML SOLN COMPARISON:  Brain MRI 06/03/2015 FINDINGS: CT HEAD Brain: Focal hypoattenuation in the posterior left frontal lobe involving the precentral gyrus corresponds to the acute infarct described on the recent MRI without interval change in size. Ventricles are normal in size. There is no evidence of acute intracranial hemorrhage, mass, midline shift, or extra-axial fluid collection. Calvarium and skull base: No fracture or destructive skull lesion. Paranasal sinuses: Small amount of fluid in the left maxillary and left sphenoid sinuses. Mild right maxillary sinus mucosal thickening. Small bilateral mastoid effusions. Orbits: Unremarkable. CTA NECK Aortic arch: 3 vessel aortic arch. Brachiocephalic and subclavian arteries are widely patent. Right carotid system: Patent without evidence of stenosis, dissection, or significant atherosclerosis. Left carotid system: There is a 7 x 3 mm filling defect projecting from the medial wall of the distal common carotid artery into the lumen just proximal to the bifurcation. The remainder of the common carotid artery as well as the cervical ICA are unremarkable allowing for slight limitation due to prominent metallic dental streak artifact through the mid ICA. Vertebral arteries: The vertebral arteries are widely patent with the left being slightly larger than the right. Skeleton: Mild-to-moderate C5-6 disc degeneration. Other neck: Endotracheal tube in place. Ground-glass and consolidative opacities with air bronchograms partially visualized in the right upper lobe. Right pleural effusion partially visualized. Enteric tube partially visualized. Small or absent thyroid. Both submandibular glands appear atrophic. CTA HEAD Anterior circulation: The internal carotid arteries are patent from skullbase to carotid termini the without stenosis. Minimal calcified plaque is noted at the  anterior genu on the right. ACAs and MCAs are patent without evidence of significant stenosis or major branch occlusion. There is slight asymmetric attenuation of small, distal left MCA branch vessels in the area of acute infarction compared to the contralateral side. No intracranial aneurysm is identified. Posterior circulation: The intracranial vertebral arteries are patent with the left being dominant. The right vertebral artery is particularly hypoplastic distal to the PICA origin. PICA, AICA, and SCA origins are patent. The basilar artery is small in caliber diffusely without evidence of a significant focal stenosis. There are fetal type origins of both PCAs. No significant PCA stenosis is seen. Venous sinuses: Patent. Anatomic variants: Fetal origins of the PCAs. Delayed phase: No abnormal enhancement. IMPRESSION: 1. 7 mm filling defect in the distal left common carotid artery consistent with a small focus of thrombus. 2. Otherwise unremarkable appearance of the cervical carotid and vertebral arteries. 3. No major intracranial arterial branch occlusion or significant stenosis. Slight attenuation of distal left MCA branch vessels in the region of the left frontal infarct. 4. Extensive right lung consolidation, incompletely visualized. Small right pleural effusion. These results were called by telephone at the time of  interpretation on 06/03/2015 at 4:18 pm to Dr. Lake Bells, who verbally acknowledged these results. Electronically Signed   By: Logan Bores M.D.   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 Right facial droop and right arm paralysis. History of Sjogren's disease. EXAM: CT ANGIOGRAPHY HEAD AND NECK TECHNIQUE: Multidetector CT imaging of the head and neck was performed using the standard protocol during bolus administration of intravenous contrast. Multiplanar CT image reconstructions and MIPs were obtained  to evaluate the vascular anatomy. Carotid stenosis measurements (when applicable) are obtained utilizing NASCET criteria, using the distal internal carotid diameter as the denominator. CONTRAST:  47mL OMNIPAQUE IOHEXOL 350 MG/ML SOLN COMPARISON:  Brain MRI 06/03/2015 FINDINGS: CT HEAD Brain: Focal hypoattenuation in the posterior left frontal lobe involving the precentral gyrus corresponds to the acute infarct described on the recent MRI without interval change in size. Ventricles are normal in size. There is no evidence of acute intracranial hemorrhage, mass, midline shift, or extra-axial fluid collection. Calvarium and skull base: No fracture or destructive skull lesion. Paranasal sinuses: Small amount of fluid in the left maxillary and left sphenoid sinuses. Mild right maxillary sinus mucosal thickening. Small bilateral mastoid effusions. Orbits: Unremarkable. CTA NECK Aortic arch: 3 vessel aortic arch. Brachiocephalic and subclavian arteries are widely patent. Right carotid system: Patent without evidence of stenosis, dissection, or significant atherosclerosis. Left carotid system: There is a 7 x 3 mm filling defect projecting from the medial wall of the distal common carotid artery into the lumen just proximal to the bifurcation. The remainder of the common carotid artery as well as the cervical ICA are unremarkable allowing for slight limitation due to prominent metallic dental streak artifact through the mid ICA. Vertebral arteries: The vertebral arteries are widely patent with the left being slightly larger than the right. Skeleton: Mild-to-moderate C5-6 disc degeneration. Other neck: Endotracheal tube in place. Ground-glass and consolidative opacities with air bronchograms partially visualized in the right upper lobe. Right pleural effusion partially visualized. Enteric tube partially visualized. Small or absent thyroid. Both submandibular glands appear atrophic. CTA HEAD Anterior circulation: The internal  carotid arteries are patent from skullbase to carotid termini the without stenosis. Minimal calcified plaque is noted at the anterior genu on the right. ACAs and MCAs are patent without evidence of significant stenosis or major branch occlusion. There is slight asymmetric attenuation of small, distal left MCA branch vessels in the area of acute infarction compared to the contralateral side. No intracranial aneurysm is identified. Posterior circulation: The intracranial vertebral arteries are patent with the left being dominant. The right vertebral artery is particularly hypoplastic distal to the PICA origin. PICA, AICA, and SCA origins are patent. The basilar artery is small in caliber diffusely without evidence of a significant focal stenosis. There are fetal type origins of both PCAs. No significant PCA stenosis is seen. Venous sinuses: Patent. Anatomic variants: Fetal origins of the PCAs. Delayed phase: No abnormal enhancement. IMPRESSION: 1. 7 mm filling defect in the distal left common carotid artery consistent with a small focus of thrombus. 2. Otherwise unremarkable appearance of the cervical carotid and vertebral arteries. 3. No major intracranial arterial branch occlusion or significant stenosis. Slight attenuation of distal left MCA branch vessels in the region of the left frontal infarct. 4. Extensive right lung consolidation, incompletely visualized. Small right pleural effusion. These results were called by telephone at the time of interpretation on 06/03/2015  at 4:18 pm to Dr. Lake Bells, who verbally acknowledged these results. Electronically Signed   By: Logan Bores M.D.   On: 06/03/2015 16:22   Mr Brain Wo Contrast  06/03/2015  CLINICAL DATA:  Weakness and altered mental status, diagnosed with flu 4 days ago. RIGHT facial droop and RIGHT arm paralysis. History of Sjogren's disease. EXAM: MRI HEAD WITHOUT CONTRAST TECHNIQUE: Multiplanar, multiecho pulse sequences of the brain and surrounding  structures were obtained without intravenous contrast. COMPARISON:  CT head May 30, 2015 FINDINGS: 22 x 11 mm area of reduced diffusion LEFT posterior frontal lobe, precentral gyrus with FLAIR T2 hyperintense signal and low ADC values. No susceptibility artifact to suggest hemorrhage. Local mass effect without midline shift. Ventricles and sulci are normal for patient's age. A few scattered subcentimeter supratentorial white matter FLAIR T2 hyperintensities exclusive of the aforementioned abnormality compatible with mild chronic small vessel ischemic disease. No abnormal extra-axial fluid collections. No extra-axial masses though, contrast enhanced sequences would be more sensitive. Normal major intracranial vascular flow voids seen at the skull base. Ocular globes and orbital contents are unremarkable though not tailored for evaluation. No abnormal sellar expansion. Moderate paranasal sinus mucosal thickening with scattered air-fluid levels, life-support lines in place. Bilateral mastoid effusions. 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 Mild chronic small vessel ischemic disease. Electronically Signed   By: Elon Alas M.D.   On: 06/03/2015 01:37   Dg Chest Port 1 View  06/04/2015  CLINICAL DATA:  Hypoxia EXAM: PORTABLE CHEST 1 VIEW COMPARISON:  June 03, 2015 FINDINGS: Endotracheal tube tip is 2.7 cm above the carina. Feeding tube tip is below the diaphragm. No pneumothorax. There is extensive airspace consolidation in portions of the right mid and lower lung zones, slightly less than 1 day prior. Mild patchy infiltrate in the left base is stable. No new opacity is seen. Heart is upper normal in size with pulmonary vascularity within normal limits. IMPRESSION: Extensive airspace consolidation throughout right mid and lower lung zones, slightly less than 1 day prior. Suspect pneumonia. Mild patchy infiltrate in the left base is also present, stable. No  change in cardiac silhouette. Tube and catheter positions are as described without pneumothorax. Electronically Signed   By: Lowella Grip III M.D.   On: 06/04/2015 07:57   Dg Chest Port 1 View  06/03/2015  CLINICAL DATA:  Post bronchoscopy, intubation EXAM: PORTABLE CHEST 1 VIEW COMPARISON:  Portable exam 1624 hours compared to 0457 hours FINDINGS: Tip of endotracheal tube projects 5.4 cm above carina. Nasogastric tube extends to at least mid stomach. Stable heart size and mediastinal contours for rotation to the LEFT. Slight pulmonary vascular congestion. Diffuse airspace infiltrate RIGHT upper and RIGHT lower lobes compatible with pneumonia. Minimal LEFT lower lobe infiltrate questioned. No pneumothorax. Bones unremarkable. IMPRESSION: Persistent diffuse RIGHT lung infiltrates with question minimal LEFT lower lobe infiltrate favoring pneumonia. Electronically Signed   By: Lavonia Dana M.D.   On: 06/03/2015 16:40   Dg Chest Port 1 View  06/03/2015  CLINICAL DATA:  Endotracheal intubation. EXAM: PORTABLE CHEST 1 VIEW COMPARISON:  06/02/2015 FINDINGS: Examination is limited due to patient rotation. Endotracheal tube is present with tip measuring about 2.7 cm above the carina. An enteric tube is present. The tip is off the field of view but is below the left hemidiaphragm. Heart size and pulmonary vascularity are normal. Diffuse airspace disease in the right mid and lower lung zone likely representing pneumonia. Small right pleural effusion. Mild left  perihilar infiltration. No pneumothorax. Right central venous catheter appears to have been removed. IMPRESSION: Appliances appear in satisfactory location. Right mid and lower lung zone infiltrates with small right pleural effusion suggesting pneumonia. Mild left perihilar infiltration. No change since previous study, lying for differences in patient positioning. Electronically Signed   By: Lucienne Capers M.D.   On: 06/03/2015 05:57   Dg Chest Port 1  View  06/02/2015  CLINICAL DATA:  Endotracheal placement EXAM: PORTABLE CHEST 1 VIEW COMPARISON:  Earlier same day FINDINGS: Endotracheal tube has its tip 3 cm above the carina. Soft feeding tube enters the abdomen. Right subclavian central line has its tip in the SVC above the right atrium. Bilateral lower lobe pneumonia right worse than left persists. Small amount of pleural fluid on the right. No new finding. IMPRESSION: Endotracheal tube well position with its tip 3 cm above the carina. Bilateral lower lobe pneumonia right worse than left. Electronically Signed   By: Nelson Chimes M.D.   On: 06/02/2015 14:49     Assessment/Plan: Group A Strep Bacteremia Await EGD to see if she can tolerated TEE (has hx of esophageal strictures) Await TEE Repeat BCx are ngtd/pending  Cerebral Embolism  Pneumonia Influenza Complete tamiflu today Stop isolation at day 7 (uptodate) CXR improving On study drug  Leukocytosis (steroid effect) improving  Total days of antibiotics: 6 ceftriaxone         Bobby Rumpf Infectious Diseases (pager) [phone] [email or web address] 06/04/2015, 10:12 AM  LOS: 5 days

## 2015-06-04 NOTE — Progress Notes (Signed)
Inpatient Diabetes Program Recommendations  AACE/ADA: New Consensus Statement on Inpatient Glycemic Control (2015)  Target Ranges:  Prepandial:   less than 140 mg/dL      Peak postprandial:   less than 180 mg/dL (1-2 hours)      Critically ill patients:  140 - 180 mg/dL   Review of Glycemic Control  Results for SAHARRA, CALCOTE (MRN [id number]) as of 06/04/2015 09:26  Ref. Range 06/03/2015 16:59 06/03/2015 20:22 06/03/2015 23:41 06/04/2015 03:41 06/04/2015 07:55  Glucose-Capillary Latest Ref Range: 65-99 mg/dL 153 (H) 148 (H) 176 (H) 188 (H) 187 (H)    Diabetes history: none noted Outpatient Diabetes medications: none Current orders for Inpatient glycemic control: Lantus 10 units qhs, Novolog 0-15 units q4h  Inpatient Diabetes Program Recommendations: Consider starting tube feed coverage insulin , Novolog 3 units q4h (hold if feeds are held ) or consider increasing Lantus to 14 units qhs   Gentry Fitz, RN, [place], , CDE Diabetes Coordinator Inpatient Diabetes Program  [phone] (Team Pager) [phone] ([place]) 06/04/2015 9:32 AM

## 2015-06-04 NOTE — Progress Notes (Signed)
STROKE TEAM PROGRESS NOTE   SUBJECTIVE (INTERVAL HISTORY) Her husband and son are at the bedside.  Pt still intubated and not open eyes on voice or pain stimulation. Still has left UE increased tone and right UE plegia. WBC trending down and no fever. BCx negative for 2 days now. Pt is on heparin drip with low intensity.     OBJECTIVE Temp:  [98.2 F (36.8 C)-99.1 F (37.3 C)] 99 F (37.2 C) (02/17 1300) Pulse Rate:  [73-94] 83 (02/17 1500) Cardiac Rhythm:  [-] Normal sinus rhythm (02/17 1200) Resp:  [16-29] 23 (02/17 1500) BP: (116-145)/(60-80) 128/62 mmHg (02/17 1500) SpO2:  [88 %-98 %] 96 % (02/17 1500) FiO2 (%):  [30 %-50 %] 40 % (02/17 1200) Weight:  [154 lb 1.6 oz (69.9 kg)] 154 lb 1.6 oz (69.9 kg) (02/17 0500)  CBC:  Recent Labs Lab 05/30/15 0500  06/01/15 1140  06/03/15 0400 06/04/15 0035  WBC 13.8*  < > 40.1*  < > 28.0* 22.6*  NEUTROABS 12.7*  --  35.3*  --   --   --   HGB 13.6  < > 11.0*  < > 11.3* 11.1*  HCT 40.9  < > 32.9*  < > 32.9* 33.3*  MCV 85.9  < > 83.5  < > 82.3 83.5  PLT 266  < > 121*  < > 103* 122*  < > = values in this interval not displayed.  Basic Metabolic Panel:  Recent Labs Lab 06/02/15 1945  06/03/15 1830 06/04/15 0035  NA 141  < > 136 139  K 3.6  < > 3.2* 3.7  CL 107  < > 103 106  CO2 24  < > 26 25  GLUCOSE 231*  < > 175* 190*  BUN 32*  < > 22* 22*  CREATININE 0.65  < > 0.59 0.49  CALCIUM 8.6*  < > 8.1* 8.2*  MG 1.8  --  1.8  --   PHOS 1.3*  --  3.5  --   < > = values in this interval not displayed.  Lipid Panel:     Component Value Date/Time   CHOL 95 06/03/2015 1830   TRIG 72 06/03/2015 1830   HDL 16* 06/03/2015 1830   CHOLHDL 5.9 06/03/2015 1830   VLDL 14 06/03/2015 1830   LDLCALC 65 06/03/2015 1830   HgbA1c: No results found for: HGBA1C Urine Drug Screen:     Component Value Date/Time   LABOPIA POSITIVE* 05/30/2015 0558   COCAINSCRNUR NONE DETECTED 05/30/2015 0558   LABBENZ NONE DETECTED 05/30/2015 0558   AMPHETMU  NONE DETECTED 05/30/2015 0558   THCU NONE DETECTED 05/30/2015 0558   LABBARB NONE DETECTED 05/30/2015 0558      IMAGING I have personally reviewed the radiological images below and agree with the radiology interpretations.  Mr Brain Wo Contrast 06/03/2015   Acute small LEFT frontal lobe (precentral gyrus, MCA territory) infarct. Mild chronic small vessel ischemic disease.   2D Echocardiogram  - Left ventricle: The cavity size was normal. Systolic function wasnormal. The estimated ejection fraction was in the range of 60%to 65%. Wall motion was normal; there were no regional wallmotion abnormalities. - Mitral valve: There was mild regurgitation. - Tricuspid valve: There was mild regurgitation. - Pulmonic valve: Poorly visualized. - Pulmonary arteries: PA peak pressure: 35 mm Hg (S). Impressions:  - There was no evidence of a vegetation.  CTA head and neck -  1. 7 mm filling defect in the distal left common carotid artery consistent  with a small focus of thrombus. 2. Otherwise unremarkable appearance of the cervical carotid and vertebral arteries. 3. No major intracranial arterial branch occlusion or significant stenosis. Slight attenuation of distal left MCA branch vessels in the region of the left frontal infarct. 4. Extensive right lung consolidation, incompletely visualized. Small right pleural effusion.  TEE - pending   Physical exam  Temp:  [98.2 F (36.8 C)-99.1 F (37.3 C)] 99 F (37.2 C) (02/17 1300) Pulse Rate:  [73-94] 83 (02/17 1500) Resp:  [16-29] 23 (02/17 1500) BP: (116-145)/(60-80) 128/62 mmHg (02/17 1500) SpO2:  [88 %-98 %] 96 % (02/17 1500) FiO2 (%):  [30 %-50 %] 40 % (02/17 1200) Weight:  [154 lb 1.6 oz (69.9 kg)] 154 lb 1.6 oz (69.9 kg) (02/17 0500)  General - Well nourished, well developed, intubated on sedation.  Ophthalmologic - Fundi not visualized due to positioning and ET tube.  Cardiovascular - Regular rate and rhythm.  Neuro - intubated  on sedation, not responsive. Not open eyes on pain stimulation. PERRL, slight doll's eyes, but positive corneal and gag. Breathing over the vent. On pain stimulation, slight withdraw BLE, but not BUE. Right UE decreased muscle tone, and LUE increased muscle tone. Babinski negative. Sensation, coordination or gait not tested.   ASSESSMENT/PLAN [name] is a 60 y.o. female with history of Sjogren's disease, seizure, hypothyroidism and dysphagia, diagnosed with the flu 4 days prior to arrival, presenting with weakness and altered mental status. Code stroke called on arrival due to right facial droop and flaccid paralysis R arm. In the ED she was found to have hypooxic respiratory failure. LP negative for CNS infection. MRI showed left MCA embolic stroke.  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 Endocarditis vs. Aflutter/afib vs. hyercoagulable state due to severe infection. Need to rule out antiphospholipid syndrome in the future.  Resultant  RUE plegia  MRI  small L frontal lobe MCA infart  CTA head and neck - left CCA thrombus, no mycotic aneurysm.  2D Echo  No SOE, vegetation  TEE to be scheduled to eval for endocarditis  EEG spike activity from the left frontal lobe, likely associated with current stroke  LDL 65  HgbA1c pending  Hypercoagulable work up pending  Heparin IV for VTE prophylaxis Diet NPO time specified  No antithrombotic prior to admission, now on clopidogrel 75 mg daily. Due to CCA thrombus, aflutter and no mycotic aneurysm seen, start on heparin drip low intensity for anticoagulation.   Ongoing aggressive stroke risk factor management  Therapy recommendations:  pending   Disposition:  pending   Respiratory Failure Shock Refractory hypoxia Pneumonia / FLU  Intubated on admission in ED  CTA neck showed extensive right LL consolidation  Flu diagnosis 4 days prior to admission, started on Tamiflu at that time  On rocephin  and tamiflu  WBC trending down  Afebrile overnight  Atrial Flutter  EKG shows atrial flutter with variable A-V block 2/15 around 8p  Start heparin drip for anticoagulation in the setting of left CCA thrombus.  SVT on admission   As per husband, pt did have once heart palpitation at home before admission  ? Endocarditis  Will recommend TEE ASAP to rule out  No mycotic aneurysm seen on CTA head  Blood cultures 2/12 Group a strep pan sensitive, 2/14 neg x 48h, 2/15 ng x 24h  On heparin drip as no mytoctic aneurysm, and culture more than 48 hours no growth  Sjogren syndrome  On  plaquenil and steroids at home, currently off  Hypercoagulable and autoimmune work up to rule out antiphospholipid syndrome.  LUE increased tone  Unclear etiology  EEG no seizure seen but left frontal spike activity - not fit to the picture  Need repeat EEG Monday  Hypotension, resolved Hx hypertension  Stable  Hyperlipidemia  Home meds:  No statin  Now on lipitor 40 mg daily  LDL 65  LDL goal < 70  Continue statin at discharge  Other Stroke Risk Factors  Advanced age  Other Active Problems  Hypothyroidism  Hx Dysphagia prior to admission  AKI and hyponatremia resolved  ? Esophageal stricture secondary to Sjogren's  Raynaud's   [facility] day # 5  This patient is critically ill due to left MCA stroke, respiratory failure and left CCA thrombus and at significant risk of neurological worsening, death form recurrent stroke, hemorrhagic transformation, cardiac failure, sepsis, DIC. This patient's care requires constant monitoring of vital signs, hemodynamics, respiratory and cardiac monitoring, review of multiple databases, neurological assessment, discussion with family, other specialists and medical decision making of high complexity. I spent 50 minutes of neurocritical care time in the care of this patient. I had long discussion with husband and son at bedside,  updated them diagnosis, treatment options and prognosis. All questions answered.  Rosalin Hawking, MD PhD Stroke Neurology 06/04/2015 3:26 PM    To contact Stroke Continuity provider, please refer to [email or web address] After hours, contact General Neurology

## 2015-06-04 NOTE — Research (Signed)
Late entry, [id number]:  CRITICAL VALUE ALERT  Critical value received:  WBC, 42.3 (drawn 06/02/15 at 10:29 - processed by remote study lab).  Result already known (drawn as part of routine care).  Date of notification:  06/03/15  Time of notification:  15:04  Critical value read back: Yes  Nurse who received alert:  Doreatha Martin, RN  MD notified (1st page):  Titus Mould, MD  Time of first page:  18:30  MD notified (2nd page):  Time of second page:  Responding MD:  Titus Mould, MD  Time MD responded:  18:30.

## 2015-06-04 NOTE — Progress Notes (Signed)
Name:[name] [name] MVE:[id number] DOB:Aug 15, 1955   ADMISSION DATE: 05/29/2015 CONSULTATION DATE: 05/30/15  REFERRING MD: Dr. Leonides Schanz  CHIEF COMPLAINT: Hypoxic respiratory failure  HISTORY OF PRESENT ILLNESS:  [name] is a 60 y/o woman with a history of Sjogren's disease, hypothyroidism and dysphagia. She was diagnosed with the flu (A) about 4 days ago and started on tamiflu. Being managed with dense PNA, group A strep bacteremia, acute resp failure, and now CVA LFrontal Lobe.     OVERNIGHT EVENTS - Vent 40% FIO2, PEEP 5, Rate 12.  - Left CC thrombus > placed on heparin - More reactive - PCT 0.83 - No sedation - RASS -4  BP 130/72 mmHg  Pulse 73  Temp(Src) 98.6 F (37 C) (Core (Comment))  Resp 18  Ht _0  (1.575 m)  Wt 154 lb 1.6 oz (69.9 kg)  BMI 28.18 kg/m2  SpO2 96%     Recent Labs Lab 06/03/15 0400 06/03/15 1830 06/04/15 0035  NA 142 136 139  K 3.5 3.2* 3.7  CL 109 103 106  CO2 _1 BUN 30* 22* 22*  CREATININE 0.57 0.59 0.49  GLUCOSE 171* 175* 190*    Recent Labs Lab 06/02/15 0500 06/03/15 0400 06/04/15 0035  HGB 10.8* 11.3* 11.1*  HCT 31.7* 32.9* 33.3*  WBC 35.1* 28.0* 22.6*  PLT 116* 103* 122*   CBG (last 3)   Recent Labs  06/03/15 2022 06/03/15 2341 06/04/15 0341  GLUCAP 148* 176* 188*    ABG    Component Value Date/Time   PHART 7.526* 06/04/2015 0236   PCO2ART 33.8* 06/04/2015 0236   PO2ART 105.0* 06/04/2015 0236   HCO3 27.9* 06/04/2015 0236   TCO2 29 06/04/2015 0236   ACIDBASEDEF 3.8* 06/02/2015 0920   O2SAT 99.0 06/04/2015 0236    Imaging Ct Angio Head W/cm &/or Wo Cm  06/03/2015  CLINICAL DATA:  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 Right facial droop and right arm paralysis. History of Sjogren's disease. EXAM: CT ANGIOGRAPHY HEAD AND NECK TECHNIQUE: Multidetector CT imaging of the head and neck was performed using the standard protocol during bolus administration of intravenous  contrast. Multiplanar CT image reconstructions and MIPs were obtained to evaluate the vascular anatomy. Carotid stenosis measurements (when applicable) are obtained utilizing NASCET criteria, using the distal internal carotid diameter as the denominator. CONTRAST:  13m OMNIPAQUE IOHEXOL 350 MG/ML SOLN COMPARISON:  Brain MRI 06/03/2015 FINDINGS: CT HEAD Brain: Focal hypoattenuation in the posterior left frontal lobe involving the precentral gyrus corresponds to the acute infarct described on the recent MRI without interval change in size. Ventricles are normal in size. There is no evidence of acute intracranial hemorrhage, mass, midline shift, or extra-axial fluid collection. Calvarium and skull base: No fracture or destructive skull lesion. Paranasal sinuses: Small amount of fluid in the left maxillary and left sphenoid sinuses. Mild right maxillary sinus mucosal thickening. Small bilateral mastoid effusions. Orbits: Unremarkable. CTA NECK Aortic arch: 3 vessel aortic arch. Brachiocephalic and subclavian arteries are widely patent. Right carotid system: Patent without evidence of stenosis, dissection, or significant atherosclerosis. Left carotid system: There is a 7 x 3 mm filling defect projecting from the medial wall of the distal common carotid artery into the lumen just proximal to the bifurcation. The remainder of the common carotid artery as well as the cervical ICA are unremarkable allowing for slight limitation due to prominent metallic dental streak artifact through the mid ICA. Vertebral arteries: The vertebral arteries are widely patent with the left  being slightly larger than the right. Skeleton: Mild-to-moderate C5-6 disc degeneration. Other neck: Endotracheal tube in place. Ground-glass and consolidative opacities with air bronchograms partially visualized in the right upper lobe. Right pleural effusion partially visualized. Enteric tube partially visualized. Small or absent thyroid. Both  submandibular glands appear atrophic. CTA HEAD Anterior circulation: The internal carotid arteries are patent from skullbase to carotid termini the without stenosis. Minimal calcified plaque is noted at the anterior genu on the right. ACAs and MCAs are patent without evidence of significant stenosis or major branch occlusion. There is slight asymmetric attenuation of small, distal left MCA branch vessels in the area of acute infarction compared to the contralateral side. No intracranial aneurysm is identified. Posterior circulation: The intracranial vertebral arteries are patent with the left being dominant. The right vertebral artery is particularly hypoplastic distal to the PICA origin. PICA, AICA, and SCA origins are patent. The basilar artery is small in caliber diffusely without evidence of a significant focal stenosis. There are fetal type origins of both PCAs. No significant PCA stenosis is seen. Venous sinuses: Patent. Anatomic variants: Fetal origins of the PCAs. Delayed phase: No abnormal enhancement. IMPRESSION: 1. 7 mm filling defect in the distal left common carotid artery consistent with a small focus of thrombus. 2. Otherwise unremarkable appearance of the cervical carotid and vertebral arteries. 3. No major intracranial arterial branch occlusion or significant stenosis. Slight attenuation of distal left MCA branch vessels in the region of the left frontal infarct. 4. Extensive right lung consolidation, incompletely visualized. Small right pleural effusion. These results were called by telephone at the time of interpretation on 06/03/2015 at 4:18 pm to Dr. Lake Bells, who verbally acknowledged these results. Electronically Signed   By: Logan Bores M.D.   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 Right facial droop and right arm paralysis. History of Sjogren's disease. EXAM: CT ANGIOGRAPHY HEAD AND NECK  TECHNIQUE: Multidetector CT imaging of the head and neck was performed using the standard protocol during bolus administration of intravenous contrast. Multiplanar CT image reconstructions and MIPs were obtained to evaluate the vascular anatomy. Carotid stenosis measurements (when applicable) are obtained utilizing NASCET criteria, using the distal internal carotid diameter as the denominator. CONTRAST:  35m OMNIPAQUE IOHEXOL 350 MG/ML SOLN COMPARISON:  Brain MRI 06/03/2015 FINDINGS: CT HEAD Brain: Focal hypoattenuation in the posterior left frontal lobe involving the precentral gyrus corresponds to the acute infarct described on the recent MRI without interval change in size. Ventricles are normal in size. There is no evidence of acute intracranial hemorrhage, mass, midline shift, or extra-axial fluid collection. Calvarium and skull base: No fracture or destructive skull lesion. Paranasal sinuses: Small amount of fluid in the left maxillary and left sphenoid sinuses. Mild right maxillary sinus mucosal thickening. Small bilateral mastoid effusions. Orbits: Unremarkable. CTA NECK Aortic arch: 3 vessel aortic arch. Brachiocephalic and subclavian arteries are widely patent. Right carotid system: Patent without evidence of stenosis, dissection, or significant atherosclerosis. Left carotid system: There is a 7 x 3 mm filling defect projecting from the medial wall of the distal common carotid artery into the lumen just proximal to the bifurcation. The remainder of the common carotid artery as well as the cervical ICA are unremarkable allowing for slight limitation due to prominent metallic dental streak artifact through the mid ICA. Vertebral arteries: The vertebral arteries are widely patent with the left being slightly larger  than the right. Skeleton: Mild-to-moderate C5-6 disc degeneration. Other neck: Endotracheal tube in place. Ground-glass and consolidative opacities with air bronchograms partially visualized in  the right upper lobe. Right pleural effusion partially visualized. Enteric tube partially visualized. Small or absent thyroid. Both submandibular glands appear atrophic. CTA HEAD Anterior circulation: The internal carotid arteries are patent from skullbase to carotid termini the without stenosis. Minimal calcified plaque is noted at the anterior genu on the right. ACAs and MCAs are patent without evidence of significant stenosis or major branch occlusion. There is slight asymmetric attenuation of small, distal left MCA branch vessels in the area of acute infarction compared to the contralateral side. No intracranial aneurysm is identified. Posterior circulation: The intracranial vertebral arteries are patent with the left being dominant. The right vertebral artery is particularly hypoplastic distal to the PICA origin. PICA, AICA, and SCA origins are patent. The basilar artery is small in caliber diffusely without evidence of a significant focal stenosis. There are fetal type origins of both PCAs. No significant PCA stenosis is seen. Venous sinuses: Patent. Anatomic variants: Fetal origins of the PCAs. Delayed phase: No abnormal enhancement. IMPRESSION: 1. 7 mm filling defect in the distal left common carotid artery consistent with a small focus of thrombus. 2. Otherwise unremarkable appearance of the cervical carotid and vertebral arteries. 3. No major intracranial arterial branch occlusion or significant stenosis. Slight attenuation of distal left MCA branch vessels in the region of the left frontal infarct. 4. Extensive right lung consolidation, incompletely visualized. Small right pleural effusion. These results were called by telephone at the time of interpretation on 06/03/2015 at 4:18 pm to Dr. Lake Bells, who verbally acknowledged these results. Electronically Signed   By: Logan Bores M.D.   On: 06/03/2015 16:22   Mr Brain Wo Contrast  06/03/2015  CLINICAL DATA:  Weakness and altered mental status, diagnosed  with flu 4 days ago. RIGHT facial droop and RIGHT arm paralysis. History of Sjogren's disease. EXAM: MRI HEAD WITHOUT CONTRAST TECHNIQUE: Multiplanar, multiecho pulse sequences of the brain and surrounding structures were obtained without intravenous contrast. COMPARISON:  CT head May 30, 2015 FINDINGS: 22 x 11 mm area of reduced diffusion LEFT posterior frontal lobe, precentral gyrus with FLAIR T2 hyperintense signal and low ADC values. No susceptibility artifact to suggest hemorrhage. Local mass effect without midline shift. Ventricles and sulci are normal for patient's age. A few scattered subcentimeter supratentorial white matter FLAIR T2 hyperintensities exclusive of the aforementioned abnormality compatible with mild chronic small vessel ischemic disease. No abnormal extra-axial fluid collections. No extra-axial masses though, contrast enhanced sequences would be more sensitive. Normal major intracranial vascular flow voids seen at the skull base. Ocular globes and orbital contents are unremarkable though not tailored for evaluation. No abnormal sellar expansion. Moderate paranasal sinus mucosal thickening with scattered air-fluid levels, life-support lines in place. Bilateral mastoid effusions. 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 Mild chronic small vessel ischemic disease. Electronically Signed   By: Elon Alas M.D.   On: 06/03/2015 01:37   Dg Chest Port 1 View  06/03/2015  CLINICAL DATA:  Post bronchoscopy, intubation EXAM: PORTABLE CHEST 1 VIEW COMPARISON:  Portable exam 1624 hours compared to 0457 hours FINDINGS: Tip of endotracheal tube projects 5.4 cm above carina. Nasogastric tube extends to at least mid stomach. Stable heart size and mediastinal contours for rotation to the LEFT. Slight pulmonary vascular congestion. Diffuse airspace infiltrate RIGHT upper and RIGHT lower lobes compatible with pneumonia. Minimal LEFT lower  lobe infiltrate  questioned. No pneumothorax. Bones unremarkable. IMPRESSION: Persistent diffuse RIGHT lung infiltrates with question minimal LEFT lower lobe infiltrate favoring pneumonia. Electronically Signed   By: Lavonia Dana M.D.   On: 06/03/2015 16:40   Dg Chest Port 1 View  06/03/2015  CLINICAL DATA:  Endotracheal intubation. EXAM: PORTABLE CHEST 1 VIEW COMPARISON:  06/02/2015 FINDINGS: Examination is limited due to patient rotation. Endotracheal tube is present with tip measuring about 2.7 cm above the carina. An enteric tube is present. The tip is off the field of view but is below the left hemidiaphragm. Heart size and pulmonary vascularity are normal. Diffuse airspace disease in the right mid and lower lung zone likely representing pneumonia. Small right pleural effusion. Mild left perihilar infiltration. No pneumothorax. Right central venous catheter appears to have been removed. IMPRESSION: Appliances appear in satisfactory location. Right mid and lower lung zone infiltrates with small right pleural effusion suggesting pneumonia. Mild left perihilar infiltration. No change since previous study, lying for differences in patient positioning. Electronically Signed   By: Lucienne Capers M.D.   On: 06/03/2015 05:57   Dg Chest Port 1 View  06/02/2015  CLINICAL DATA:  Endotracheal placement EXAM: PORTABLE CHEST 1 VIEW COMPARISON:  Earlier same day FINDINGS: Endotracheal tube has its tip 3 cm above the carina. Soft feeding tube enters the abdomen. Right subclavian central line has its tip in the SVC above the right atrium. Bilateral lower lobe pneumonia right worse than left persists. Small amount of pleural fluid on the right. No new finding. IMPRESSION: Endotracheal tube well position with its tip 3 cm above the carina. Bilateral lower lobe pneumonia right worse than left. Electronically Signed   By: Nelson Chimes M.D.   On: 06/02/2015 14:49   STUDIES:  Head CT 2/12 >> no acute hemorrhagic infarct.  Chest CT 2/12 >>  ground glass opacities, consolidation c/w pneumonia.  CXR 2/14 / 2/15 >> little change. R lung consolidation, ? L infiltrate.  CXR 2/16 >> improving consolidation on the right MRI Head 2/15 > L Frontal precentral gyrus CVA CTA Head / Neck 2/16 > L CC thrombus  CULTURES: Blood 2/12>>>Group A Strep pan sensitive.  Blood Cx 2/15 >> NG x 1 day.  Blood Cx 2/14 >> NG x 2days.  Urine 2/12>> negative.  CSF >> Negative  Resp 2/14 > No organisms on gram stain, culture pending  ANTIBIOTICS: Vanc 2/12>>> 2/15 aztreonam  2/12>>> 2/14 Acyclovir>>>2/12  levaquin 2/12->> 2/15 Oseltamavir 2/13 >> Stop date 2/20 MEJII 2/15 >> Ceftriaxone 2/15 >>  SIGNIFICANT EVENTS: Intubated in ED Shock Refractory hypoxia 2/13 > weaning Vent overnight.  2/15 > CVA on MRI 2/16 > Left CC thrombus, started on Heparin.   LINES/TUBES: Right subclavian CVC 2/12>> 2/15 Foley 2/12>> ETT 2/12>> A line 2/12>>>2/14   Physical Exam:  General: remains unresponsive off sedation, pERRLA Neuro:  intubated, cough +, gag + HEENT:jvd, ETT, NG tube PULM: coarse throughout Improving aeration.  CV: s1 s2 RRT, No MGR GI: soft, bs wnl, nondistended.  Extremities: no edema, boots in place.    DISCUSSION: 60 y/o with influenza A and right sided pneumonia in significant respiratory distress, intubated for hypoxic respiratory failure found to have L Frontal CVA and LCC thrombus.   ASSESSMENT / PLAN:  PULMONARY A: Pneumonia (necrotizing?) Dense infiltrate- continued daily improvements resp alk mild P:  40% peep 5 - Rate to 10, Decrease rate, T vby 1 cc/kg ABG again at 1pm and in the AM.   Attempt Wean  again today, cpap 5 ps 5, goal 2 hr , no extubation with current MS Goal fluid balance negative to even pcxr daily.   ABX per ID section.   CARDIOVASCULAR A:  Septic shock - resolved SVT (history supports fib at home) adequate cortisol response Hypotension - not on pressors.  H./o afib? > a flutter with RVR  resolved.  Distal CC thrombus P:  Fluids off.  Lopressor prn . Lasix to neg May need amio if irregular rhythm returns Heparin see neuro TTE > mildly elevated PA pressure, otherwise mostly normal > Plan TEE  RENAL A:  AKI. Hyponatremia - resolved NONAG met acidosis- saline   Recent Labs Lab 06/03/15 0400 06/03/15 1830 06/04/15 0035  NA 142 136 139    P:  Lasix reduced, keep same dose Avoid saline 1/2 NS KVO K repleted. k goal greater 4 with svt prior  GASTROINTESTINAL A:  Question of esophageal stricture, sjorgens P:  Cortrak placed. Tube feeds to goal- hold for tee Protonix.  LFT normal.  Cards aware of esoph concerns  INFECTIOUS A:  Pneumonia (r/o different organism than group A strep ) Doubt infectious clot Flu A P:  tamiflu x 7 days STOP date 2/20 Ceftriaxone remains  Total 6 days Continued neb Aminoglycoside study drug ID on board and recommending lengthy abx course. Needs TEE per ID. Less likely to be endocarditis.   Endocrine: A:  Hypothyroidism, c/w sick euthryoid Hyperglycemia P:  IV synthroid ordered, keep same Treat underlying condition repeat T3 T4 in 2 months Moderate SSI Lantus 10 u qd.   NEUROLOGIC A:  Severe hypoxia requiring paralysis On presentation weakness CVA on MRI Brain c/w right sided deficits.  Left CC A thrombus identified on CTA (doubt infectious) Remains deep  P:  RASS goal:0 MRI reviewed EEG spike activity from left frontal lobe > cva area, not seziure Heparin given thrombbus Statin when able.  Carotid dopplers > CTA performed.  TEE when able.  Discussed with Neuro > Heparin Chance of good neurological prognosis.    FAMILY  - Updates: husband daily  - Inter-disciplinary family meet or Palliative Care meeting due by: day Honor, MD Family Medicine -PGY 2   STAFF NOTE: I, Merrie Roof, MD FACP have personally reviewed patient's available data, including medical  history, events of note, physical examination and test results as part of my evaluation. I have discussed with resident/NP and other care providers such as pharmacist, RN and RRT. 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), k supp to goal 4, mag, phos in am , NPO for tee, he[parin drip for now for cot in carotid, she had clinical signs of stroke upon admission and husband admitted to "racing irregular" heart prior to admission, maintain MEIJI neb, infiltrate rapid resolution, follow bronch bal as concerns not group a strep, ID consult appreciated, wean today , no extubation planned The patient is critically ill with multiple organ systems failure and requires high complexity decision making for assessment and support, frequent evaluation and titration of therapies, application of advanced monitoring technologies and extensive interpretation of multiple databases.   Critical Care Time devoted to  patient care services described in this note is30 Minutes. This time reflects time of care of this signee: Merrie Roof, MD FACP. This critical care time does not reflect procedure time, or teaching time or supervisory time of PA/NP/Med student/Med Resident etc but could involve care discussion time. Rest per NP/medical resident whose note is outlined above and that I agree with   Lavon Paganini. Titus Mould, MD, [place] Pgr: [facility] 06/04/2015 8:34 AM

## 2015-06-04 NOTE — Progress Notes (Signed)
[place] for heparin  Indication: AFlutter  Allergies  Allergen Reactions  . Erythromycin Ethylsuccinate Hives  . Keflex [Cephalexin] Hives  . Penicillins   . Sulfa Antibiotics     Patient Measurements: Height: 5\' 2"  (157.5 cm) Weight: 151 lb 10.8 oz (68.8 kg) IBW/kg (Calculated) : 50.1 Heparin Dosing Weight: 65 kg  Vital Signs: Temp: 98.6 F (37 C) (02/17 0100) Temp Source: Core (Comment) (02/17 0000) BP: 134/71 mmHg (02/17 0100) Pulse Rate: 79 (02/17 0100)  Labs:  Recent Labs  06/01/15 1140 06/02/15 0500  06/03/15 0400 06/03/15 1830 06/04/15 0035 06/04/15 0037  HGB 11.0* 10.8*  --  11.3*  --  11.1*  --   HCT 32.9* 31.7*  --  32.9*  --  33.3*  --   PLT 121* 116*  --  103*  --  PENDING  --   APTT 41*  --   --   --   --   --   --   LABPROT 18.2*  --   --   --   --   --   --   INR 1.50*  --   --   --   --   --   --   HEPARINUNFRC  --   --   --   --   --   --  0.67  CREATININE 0.82 0.81  < > 0.57 0.59 0.49  --   CKTOTAL 103  --   --   --   --   --   --   CKMB 3.4  --   --   --   --   --   --   < > = values in this interval not displayed.  Estimated Creatinine Clearance: 67.2 mL/min (by C-G formula based on Cr of 0.49).   Assessment: 60 yo female with Aflutter and CCA thrombus for heparin.  Goal of Therapy:  Heparin level 0.3-0.5 Monitor platelets by anticoagulation protocol: Yes   Plan:  Decrease Heparin 750 units/hr Check heparin level in 8 hours.  Phillis Knack, PharmD, BCPS

## 2015-06-04 NOTE — Progress Notes (Signed)
Nutrition Follow-up  DOCUMENTATION CODES:   Not applicable  INTERVENTION:    Continue Vital AF 1.2 via Cortrak tube at 55 ml/h (1320 ml/day) to provide 1584 kcals, 99 gm protein, 1071 ml free water daily.   NUTRITION DIAGNOSIS:   Inadequate oral intake related to inability to eat as evidenced by NPO status.  Ongoing  GOAL:   Patient will meet greater than or equal to 90% of their needs  Met  MONITOR:   Vent status, Labs, Weight trends, TF tolerance, I & O's  ASSESSMENT:   60 y/o woman with a history of Sjogren's disease, hypothyroidism and dysphagia. She was diagnosed with the flu (A) about 4 days ago and started on tamiflu. According to her husband, she had actually been improving the 24 hours prior to admission with fever resolving and improvement in her nausea and vomiting. He reports that the evening of admission she developed weakness and altered mental status. She was brought to [facility] ED via ambulance.  Patient remains intubated on ventilator support. Discussed patient in ICU rounds and with RN today.  MV: 11.3 L/min Temp (24hrs), Avg:98.6 F (37 C), Min:98.1 F (36.7 C), Max:99.1 F (37.3 C)   Patient is currently receiving Vital AF 1.2 via Cortrak tube at 55 ml/h (1320 ml/day) to provide 1584 kcals, 99 gm protein, 1071 ml free water daily. TF currently on hold for TEE.  Diet Order:  Diet NPO time specified  Skin:  Reviewed, no issues  Last BM:  2/17  Height:   Ht Readings from Last 1 Encounters:  05/30/15 _0  (1.575 m)    Weight:   Wt Readings from Last 1 Encounters:  06/04/15 154 lb 1.6 oz (69.9 kg)   05/31/15 159 lb 6.3 oz (72.3 kg)  Admit weight (2/12) 148 lb 5.9 oz (67.3 kg)      Ideal Body Weight:  50 kg  BMI:  Body mass index is 28.18 kg/(m^2).  Estimated Nutritional Needs:   Kcal:  1540  Protein:  95-110 gm  Fluid:  1.6-1.8 L  EDUCATION NEEDS:   No education needs identified at this time  Molli Barrows, [place], [place],  [place] Pager [phone] After Hours Pager [phone]

## 2015-06-04 NOTE — Progress Notes (Signed)
Nebulized research treatment performed with neb starting at 10:17 and neb stopped at 10:23. During the treatment, pt was on settings of VT: 477mL's (8cc's), I time of 2.45 with an I:E ratio of 1:1, Rate of 12 bias flow is unknown. No medication was left in cup after administration. Filters were changed post treatment at 10:23. Pt tolerated well with vitals as follows post treatment HR: 79, BP: 117/80, SAT's: 98%, RR: 29. Pt. Returned to pre dose vent settings at 10:23.

## 2015-06-04 NOTE — Progress Notes (Signed)
Research Study drug was placed in Pari nebulizer machine with 44ml of styudy drug. Start time 2148, patient tidal volume was changed to 440 (46ml/kg). Along with I-time changed to achieve a 1:1 ratio. Sputum was collected before the start of therapy. Hr=66,RR=14,b/P=126/61 ans Sp02=96%.

## 2015-06-04 NOTE — Progress Notes (Signed)
[place] for heparin  Indication: AFlutter  Allergies  Allergen Reactions  . Erythromycin Ethylsuccinate Hives  . Keflex [Cephalexin] Hives  . Penicillins   . Sulfa Antibiotics     Patient Measurements: Height: 5\' 2"  (157.5 cm) Weight: 154 lb 1.6 oz (69.9 kg) IBW/kg (Calculated) : 50.1 Heparin Dosing Weight: 65 kg  Vital Signs: Temp: 99 F (37.2 C) (02/17 1300) Temp Source: Core (Comment) (02/17 1123) BP: 126/62 mmHg (02/17 1912) Pulse Rate: 79 (02/17 1912)  Labs:  Recent Labs  06/02/15 0500  06/03/15 0400 06/03/15 1830 06/04/15 0035 06/04/15 0037 06/04/15 1019 06/04/15 1836  HGB 10.8*  --  11.3*  --  11.1*  --   --   --   HCT 31.7*  --  32.9*  --  33.3*  --   --   --   PLT 116*  --  103*  --  122*  --   --   --   HEPARINUNFRC  --   --   --   --   --  0.67 0.74* 0.68  CREATININE 0.81  < > 0.57 0.59 0.49  --   --   --   < > = values in this interval not displayed.  Estimated Creatinine Clearance: 67.6 mL/min (by C-G formula based on Cr of 0.49).   Assessment: 60 yo female with Aflutter and CCA thrombus for heparin. HL supratherapeutic. Will reduce level.   Goal of Therapy:  Heparin level 0.3-0.5 Monitor platelets by anticoagulation protocol: Yes   Plan:  Decrease heparin to 500 units/hr Daily HL, CBC    Harvel Quale  06/04/2015 7:52 PM

## 2015-06-05 DIAGNOSIS — I63412 Cerebral infarction due to embolism of left middle cerebral artery: Secondary | ICD-10-CM

## 2015-06-05 DIAGNOSIS — J9601 Acute respiratory failure with hypoxia: Secondary | ICD-10-CM

## 2015-06-05 DIAGNOSIS — I63032 Cerebral infarction due to thrombosis of left carotid artery: Secondary | ICD-10-CM

## 2015-06-05 LAB — GLUCOSE, CAPILLARY
GLUCOSE-CAPILLARY: 107 mg/dL — AB (ref 65–99)
GLUCOSE-CAPILLARY: 119 mg/dL — AB (ref 65–99)
GLUCOSE-CAPILLARY: 159 mg/dL — AB (ref 65–99)
Glucose-Capillary: 183 mg/dL — ABNORMAL HIGH (ref 65–99)
Glucose-Capillary: 128 mg/dL — ABNORMAL HIGH (ref 65–99)
Glucose-Capillary: 104 mg/dL — ABNORMAL HIGH (ref 65–99)

## 2015-06-05 LAB — BASIC METABOLIC PANEL
ANION GAP: 8 (ref 5–15)
ANION GAP: 8 (ref 5–15)
BUN: 21 mg/dL — ABNORMAL HIGH (ref 6–20)
BUN: 22 mg/dL — AB (ref 6–20)
CALCIUM: 8.4 mg/dL — AB (ref 8.9–10.3)
CALCIUM: 8.4 mg/dL — AB (ref 8.9–10.3)
CHLORIDE: 105 mmol/L (ref 101–111)
CO2: 25 mmol/L (ref 22–32)
CO2: 25 mmol/L (ref 22–32)
CREATININE: 0.46 mg/dL (ref 0.44–1.00)
Chloride: 104 mmol/L (ref 101–111)
Creatinine, Ser: 0.45 mg/dL (ref 0.44–1.00)
GFR calc non Af Amer: >60 mL/min (ref >60)
GLUCOSE: 175 mg/dL — AB (ref 65–99)
GLUCOSE: 143 mg/dL — AB (ref 65–99)
POTASSIUM: 4.4 mmol/L (ref 3.5–5.1)
Potassium: 4 mmol/L (ref 3.5–5.1)
SODIUM: 137 mmol/L (ref 135–145)
Sodium: 138 mmol/L (ref 135–145)

## 2015-06-05 LAB — CARDIOLIPIN ANTIBODIES, IGG, IGM, IGA
Anticardiolipin IgG: <9 GPL U/mL (ref 0–14)
Anticardiolipin IgM: <9 MPL U/mL (ref 0–12)

## 2015-06-05 LAB — SJOGRENS SYNDROME-A EXTRACTABLE NUCLEAR ANTIBODY: SSA (Ro) (ENA) Antibody, IgG: >8 AI — ABNORMAL HIGH (ref 0.0–0.9)

## 2015-06-05 LAB — RHEUMATOID FACTOR: RHEUMATOID FACTOR: 10.3 [IU]/mL (ref 0.0–13.9)

## 2015-06-05 LAB — ANTI-DNA ANTIBODY, DOUBLE-STRANDED: DS DNA AB: 15 [IU]/mL — AB (ref 0–9)

## 2015-06-05 LAB — CBC
HCT: 35.5 % — ABNORMAL LOW (ref 36.0–46.0)
HEMATOCRIT: 35.5 % — AB (ref 36.0–46.0)
HEMOGLOBIN: 11.4 g/dL — AB (ref 12.0–15.0)
HEMOGLOBIN: 11.1 g/dL — AB (ref 12.0–15.0)
MCH: 27.9 pg (ref 26.0–34.0)
MCH: 27.3 pg (ref 26.0–34.0)
MCHC: 32.1 g/dL (ref 30.0–36.0)
MCHC: 31.3 g/dL (ref 30.0–36.0)
MCV: 86.8 fL (ref 78.0–100.0)
MCV: 87.4 fL (ref 78.0–100.0)
Platelets: 156 10*3/uL (ref 150–400)
Platelets: 198 10*3/uL (ref 150–400)
RBC: 4.09 MIL/uL (ref 3.87–5.11)
RBC: 4.06 MIL/uL (ref 3.87–5.11)
RDW: 16.1 % — ABNORMAL HIGH (ref 11.5–15.5)
RDW: 16 % — ABNORMAL HIGH (ref 11.5–15.5)
WBC: 21.2 10*3/uL — ABNORMAL HIGH (ref 4.0–10.5)
WBC: 21.9 10*3/uL — AB (ref 4.0–10.5)

## 2015-06-05 LAB — HEPARIN LEVEL (UNFRACTIONATED)
HEPARIN UNFRACTIONATED: 0.27 [IU]/mL — AB (ref 0.30–0.70)
HEPARIN UNFRACTIONATED: 0.35 [IU]/mL (ref 0.30–0.70)
Heparin Unfractionated: 0.4 IU/mL (ref 0.30–0.70)

## 2015-06-05 LAB — MAGNESIUM
MAGNESIUM: 1.9 mg/dL (ref 1.7–2.4)
Magnesium: 1.9 mg/dL (ref 1.7–2.4)

## 2015-06-05 LAB — PHOSPHORUS: Phosphorus: 3 mg/dL (ref 2.5–4.6)

## 2015-06-05 LAB — HEMOGLOBIN A1C
Hgb A1c MFr Bld: 6.6 % — ABNORMAL HIGH (ref 4.8–5.6)
MEAN PLASMA GLUCOSE: 143 mg/dL

## 2015-06-05 LAB — SJOGRENS SYNDROME-B EXTRACTABLE NUCLEAR ANTIBODY

## 2015-06-05 MED ORDER — LEVOTHYROXINE SODIUM 100 MCG PO TABS
100.0000 ug | ORAL_TABLET | Freq: Every day | ORAL | Status: DC
  Administered 2015-06-05 – 2015-06-13 (×9): 100 ug via ORAL
  Filled 2015-06-05 (×11): qty 1

## 2015-06-05 MED ORDER — FUROSEMIDE 10 MG/ML IJ SOLN
20.0000 mg | Freq: Every day | INTRAMUSCULAR | Status: DC
  Administered 2015-06-06 – 2015-06-12 (×7): 20 mg via INTRAVENOUS
  Filled 2015-06-05 (×7): qty 2

## 2015-06-05 MED ORDER — ANTISEPTIC ORAL RINSE SOLUTION (CORINZ)
7.0000 mL | Freq: Four times a day (QID) | OROMUCOSAL | Status: DC
  Administered 2015-06-05 – 2015-06-16 (×38): 7 mL via OROMUCOSAL

## 2015-06-05 NOTE — Progress Notes (Signed)
ANTICOAGULATION CONSULT NOTE - Follow Up Consult  Pharmacy Consult for Heparin  Indication: A-flutter, stroke  Allergies  Allergen Reactions  . Erythromycin Ethylsuccinate Hives  . Keflex [Cephalexin] Hives  . Penicillins   . Sulfa Antibiotics     Patient Measurements: Height: 5\' 2"  (157.5 cm) Weight: 154 lb 1.6 oz (69.9 kg) IBW/kg (Calculated) : 50.1  Vital Signs: Temp: 99.1 F (37.3 C) (02/18 0400) Temp Source: Core (Comment) (02/17 2100) BP: 125/56 mmHg (02/18 0400) Pulse Rate: 85 (02/18 0400)  Labs:  Recent Labs  06/03/15 0400 06/03/15 1830 06/04/15 0035  06/04/15 1019 06/04/15 1836 06/05/15 0234  HGB 11.3*  --  11.1*  --   --   --  11.4*  HCT 32.9*  --  33.3*  --   --   --  35.5*  PLT 103*  --  122*  --   --   --  PENDING  HEPARINUNFRC  --   --   --   < > 0.74* 0.68 0.40  CREATININE 0.57 0.59 0.49  --   --   --  0.45  < > = values in this interval not displayed.  Estimated Creatinine Clearance: 66.8 mL/min (by C-G formula based on Cr of 0.45).    Assessment: Heparin level therapeutic x 1 (low goal)  Goal of Therapy:  Heparin level 0.3-0.5 units/ml Monitor platelets by anticoagulation protocol: Yes   Plan:  -Continue heparin at 500 units/hr -1200 HL  Narda Bonds 06/05/2015,4:02 AM

## 2015-06-05 NOTE — Progress Notes (Signed)
Called pharmacy about blood-tinged clot suctioned from inline catheter. Stated that [facility] MD is aware of bleeding in urine and to watch output for now. Will continue to monitor closely. Lynnell Chad 06/05/2015  Joaquin Bend E, RN

## 2015-06-05 NOTE — Care Management Note (Signed)
Case Management Note  Patient Details  Name: [name] [name] MRN: [id number] Date of Birth: [birth year]  Subjective/Objective:   Pt admitted with sepsis, positive for flu and had to be intubated             Action/Plan:   CM received call from [name] for [facility] hired by [facility] to assist with [facility] needs at discharge, CM informed resource that pt remains on ventilator.    Pt is independent from home with husband [name], pt is a  Oncologist.  CM will continue to monitor for disposition needs   Expected Discharge Date:                  Expected Discharge Plan:  Home/Self Care  In-House Referral:     Discharge planning Services  CM Consult  Post Acute Care Choice:    Choice offered to:     DME Arranged:    DME Agency:     HH Arranged:    HH Agency:     Status of Service:  In process, will continue to follow  Medicare Important Message Given:    Date Medicare IM Given:    Medicare IM give by:    Date Additional Medicare IM Given:    Additional Medicare Important Message give by:     If discussed at [place] of Stay Meetings, dates discussed:    Additional Comments:  [name], RN 06/05/2015, 12:25 PM Case Management Note  Patient Details  Name: [name] [name] MRN: [id number] Date of Birth: [birth year]  Subjective/Objective:                    Action/Plan:   Expected Discharge Date:                  Expected Discharge Plan:  Home/Self Care  In-House Referral:     Discharge planning Services  CM Consult  Post Acute Care Choice:    Choice offered to:     DME Arranged:    DME Agency:     HH Arranged:    HH Agency:     Status of Service:  In process, will continue to follow  Medicare Important Message Given:    Date Medicare IM Given:    Medicare IM give by:    Date Additional Medicare IM Given:    Additional Medicare Important Message give by:     If discussed at [place] of Stay Meetings, dates discussed:     Additional Comments:  [name], RN 06/05/2015, 12:25 PM

## 2015-06-05 NOTE — Progress Notes (Signed)
UR Completed. Dominic Mahaney, RN, BSN.  [phone] 

## 2015-06-05 NOTE — Progress Notes (Signed)
Name:[name] [name] FGH:[id number] DOB:1956/01/04   ADMISSION DATE: 05/29/2015 CONSULTATION DATE: 05/30/15  REFERRING MD: Dr. Leonides Schanz  CHIEF COMPLAINT: Hypoxic respiratory failure  HISTORY OF PRESENT ILLNESS:  Ms/ Spade is a 60 y/o woman with a history of Sjogren's disease, hypothyroidism and dysphagia. She was diagnosed with the flu (A) about 4 days PTA and started on tamiflu. Being managed with dense PNA, group A strep bacteremia, acute resp failure, and now CVA LFrontal Lobe.     OVERNIGHT EVENTS -  RASS 0 No obvious pain Afebrile Good UO   BP 129/57 mmHg  Pulse 90  Temp(Src) 99.3 F (37.4 C) (Core (Comment))  Resp 27  Ht 5' 2"  (1.575 m)  Wt 69.9 kg (154 lb 1.6 oz)  BMI 28.18 kg/m2  SpO2 98%     Recent Labs Lab 06/03/15 1830 06/04/15 0035 06/05/15 0234  NA 136 139 138  K 3.2* 3.7 4.4  CL 103 106 105  CO2 26 25 25   BUN 22* 22* 21*  CREATININE 0.59 0.49 0.45  GLUCOSE 175* 190* 175*    Recent Labs Lab 06/03/15 0400 06/04/15 0035 06/05/15 0234  HGB 11.3* 11.1* 11.4*  HCT 32.9* 33.3* 35.5*  WBC 28.0* 22.6* 21.2*  PLT 103* 122* 156   CBG (last 3)   Recent Labs  06/04/15 1122 06/04/15 1529 06/04/15 2015  GLUCAP 93 104* 101*    ABG    Component Value Date/Time   PHART 7.510* 06/04/2015 1233   PCO2ART 34.1* 06/04/2015 1233   PO2ART 76.5* 06/04/2015 1233   HCO3 26.9* 06/04/2015 1233   TCO2 27.9 06/04/2015 1233   ACIDBASEDEF 2.0 06/04/2015 0956   O2SAT 96.5 06/04/2015 1233    Imaging Ct Angio Head W/cm &/or Wo Cm  06/03/2015  CLINICAL DATA:  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 Right facial droop and right arm paralysis. History of Sjogren's disease. EXAM: CT ANGIOGRAPHY HEAD AND NECK TECHNIQUE: Multidetector CT imaging of the head and neck was performed using the standard protocol during bolus administration of intravenous contrast. Multiplanar CT image reconstructions and MIPs were obtained to evaluate  the vascular anatomy. Carotid stenosis measurements (when applicable) are obtained utilizing NASCET criteria, using the distal internal carotid diameter as the denominator. CONTRAST:  5m OMNIPAQUE IOHEXOL 350 MG/ML SOLN COMPARISON:  Brain MRI 06/03/2015 FINDINGS: CT HEAD Brain: Focal hypoattenuation in the posterior left frontal lobe involving the precentral gyrus corresponds to the acute infarct described on the recent MRI without interval change in size. Ventricles are normal in size. There is no evidence of acute intracranial hemorrhage, mass, midline shift, or extra-axial fluid collection. Calvarium and skull base: No fracture or destructive skull lesion. Paranasal sinuses: Small amount of fluid in the left maxillary and left sphenoid sinuses. Mild right maxillary sinus mucosal thickening. Small bilateral mastoid effusions. Orbits: Unremarkable. CTA NECK Aortic arch: 3 vessel aortic arch. Brachiocephalic and subclavian arteries are widely patent. Right carotid system: Patent without evidence of stenosis, dissection, or significant atherosclerosis. Left carotid system: There is a 7 x 3 mm filling defect projecting from the medial wall of the distal common carotid artery into the lumen just proximal to the bifurcation. The remainder of the common carotid artery as well as the cervical ICA are unremarkable allowing for slight limitation due to prominent metallic dental streak artifact through the mid ICA. Vertebral arteries: The vertebral arteries are widely patent with the left being slightly larger than the right. Skeleton: Mild-to-moderate C5-6 disc degeneration. Other neck: Endotracheal tube in place. Ground-glass  and consolidative opacities with air bronchograms partially visualized in the right upper lobe. Right pleural effusion partially visualized. Enteric tube partially visualized. Small or absent thyroid. Both submandibular glands appear atrophic. CTA HEAD Anterior circulation: The internal carotid  arteries are patent from skullbase to carotid termini the without stenosis. Minimal calcified plaque is noted at the anterior genu on the right. ACAs and MCAs are patent without evidence of significant stenosis or major branch occlusion. There is slight asymmetric attenuation of small, distal left MCA branch vessels in the area of acute infarction compared to the contralateral side. No intracranial aneurysm is identified. Posterior circulation: The intracranial vertebral arteries are patent with the left being dominant. The right vertebral artery is particularly hypoplastic distal to the PICA origin. PICA, AICA, and SCA origins are patent. The basilar artery is small in caliber diffusely without evidence of a significant focal stenosis. There are fetal type origins of both PCAs. No significant PCA stenosis is seen. Venous sinuses: Patent. Anatomic variants: Fetal origins of the PCAs. Delayed phase: No abnormal enhancement. IMPRESSION: 1. 7 mm filling defect in the distal left common carotid artery consistent with a small focus of thrombus. 2. Otherwise unremarkable appearance of the cervical carotid and vertebral arteries. 3. No major intracranial arterial branch occlusion or significant stenosis. Slight attenuation of distal left MCA branch vessels in the region of the left frontal infarct. 4. Extensive right lung consolidation, incompletely visualized. Small right pleural effusion. These results were called by telephone at the time of interpretation on 06/03/2015 at 4:18 pm to Dr. Lake Bells, who verbally acknowledged these results. Electronically Signed   By: Logan Bores M.D.   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 Right facial droop and right arm paralysis. History of Sjogren's disease. EXAM: CT ANGIOGRAPHY HEAD AND NECK TECHNIQUE: Multidetector CT imaging of the head and neck was performed using the standard  protocol during bolus administration of intravenous contrast. Multiplanar CT image reconstructions and MIPs were obtained to evaluate the vascular anatomy. Carotid stenosis measurements (when applicable) are obtained utilizing NASCET criteria, using the distal internal carotid diameter as the denominator. CONTRAST:  27m OMNIPAQUE IOHEXOL 350 MG/ML SOLN COMPARISON:  Brain MRI 06/03/2015 FINDINGS: CT HEAD Brain: Focal hypoattenuation in the posterior left frontal lobe involving the precentral gyrus corresponds to the acute infarct described on the recent MRI without interval change in size. Ventricles are normal in size. There is no evidence of acute intracranial hemorrhage, mass, midline shift, or extra-axial fluid collection. Calvarium and skull base: No fracture or destructive skull lesion. Paranasal sinuses: Small amount of fluid in the left maxillary and left sphenoid sinuses. Mild right maxillary sinus mucosal thickening. Small bilateral mastoid effusions. Orbits: Unremarkable. CTA NECK Aortic arch: 3 vessel aortic arch. Brachiocephalic and subclavian arteries are widely patent. Right carotid system: Patent without evidence of stenosis, dissection, or significant atherosclerosis. Left carotid system: There is a 7 x 3 mm filling defect projecting from the medial wall of the distal common carotid artery into the lumen just proximal to the bifurcation. The remainder of the common carotid artery as well as the cervical ICA are unremarkable allowing for slight limitation due to prominent metallic dental streak artifact through the mid ICA. Vertebral arteries: The vertebral arteries are widely patent with the left being slightly larger than the right. Skeleton: Mild-to-moderate C5-6 disc degeneration. Other neck: Endotracheal tube in place. Ground-glass and consolidative opacities  with air bronchograms partially visualized in the right upper lobe. Right pleural effusion partially visualized. Enteric tube partially  visualized. Small or absent thyroid. Both submandibular glands appear atrophic. CTA HEAD Anterior circulation: The internal carotid arteries are patent from skullbase to carotid termini the without stenosis. Minimal calcified plaque is noted at the anterior genu on the right. ACAs and MCAs are patent without evidence of significant stenosis or major branch occlusion. There is slight asymmetric attenuation of small, distal left MCA branch vessels in the area of acute infarction compared to the contralateral side. No intracranial aneurysm is identified. Posterior circulation: The intracranial vertebral arteries are patent with the left being dominant. The right vertebral artery is particularly hypoplastic distal to the PICA origin. PICA, AICA, and SCA origins are patent. The basilar artery is small in caliber diffusely without evidence of a significant focal stenosis. There are fetal type origins of both PCAs. No significant PCA stenosis is seen. Venous sinuses: Patent. Anatomic variants: Fetal origins of the PCAs. Delayed phase: No abnormal enhancement. IMPRESSION: 1. 7 mm filling defect in the distal left common carotid artery consistent with a small focus of thrombus. 2. Otherwise unremarkable appearance of the cervical carotid and vertebral arteries. 3. No major intracranial arterial branch occlusion or significant stenosis. Slight attenuation of distal left MCA branch vessels in the region of the left frontal infarct. 4. Extensive right lung consolidation, incompletely visualized. Small right pleural effusion. These results were called by telephone at the time of interpretation on 06/03/2015 at 4:18 pm to Dr. Lake Bells, who verbally acknowledged these results. Electronically Signed   By: Logan Bores M.D.   On: 06/03/2015 16:22   Dg Chest Port 1 View  06/04/2015  CLINICAL DATA:  Hypoxia EXAM: PORTABLE CHEST 1 VIEW COMPARISON:  June 03, 2015 FINDINGS: Endotracheal tube tip is 2.7 cm above the carina. Feeding  tube tip is below the diaphragm. No pneumothorax. There is extensive airspace consolidation in portions of the right mid and lower lung zones, slightly less than 1 day prior. Mild patchy infiltrate in the left base is stable. No new opacity is seen. Heart is upper normal in size with pulmonary vascularity within normal limits. IMPRESSION: Extensive airspace consolidation throughout right mid and lower lung zones, slightly less than 1 day prior. Suspect pneumonia. Mild patchy infiltrate in the left base is also present, stable. No change in cardiac silhouette. Tube and catheter positions are as described without pneumothorax. Electronically Signed   By: Lowella Grip III M.D.   On: 06/04/2015 07:57   Dg Chest Port 1 View  06/03/2015  CLINICAL DATA:  Post bronchoscopy, intubation EXAM: PORTABLE CHEST 1 VIEW COMPARISON:  Portable exam 1624 hours compared to 0457 hours FINDINGS: Tip of endotracheal tube projects 5.4 cm above carina. Nasogastric tube extends to at least mid stomach. Stable heart size and mediastinal contours for rotation to the LEFT. Slight pulmonary vascular congestion. Diffuse airspace infiltrate RIGHT upper and RIGHT lower lobes compatible with pneumonia. Minimal LEFT lower lobe infiltrate questioned. No pneumothorax. Bones unremarkable. IMPRESSION: Persistent diffuse RIGHT lung infiltrates with question minimal LEFT lower lobe infiltrate favoring pneumonia. Electronically Signed   By: Lavonia Dana M.D.   On: 06/03/2015 16:40   STUDIES:  Head CT 2/12 >> no acute hemorrhagic infarct.  Chest CT 2/12 >> ground glass opacities, consolidation c/w pneumonia.  CXR 2/14 / 2/15 >> little change. R lung consolidation, ? L infiltrate.  CXR 2/16 >> improving consolidation on the right MRI Head 2/15 > L Frontal  precentral gyrus CVA CTA Head / Neck 2/16 > L CC thrombus  CULTURES: Blood 2/12>>>Group A Strep pan sensitive.  Blood Cx 2/15 >> NG x 1 day.  Blood Cx 2/14 >> NG x 2days.  Urine 2/12>>  negative.  CSF >> Negative  Resp 2/14 > No organisms on gram stain, culture pending  ANTIBIOTICS: Vanc 2/12>>> 2/15 aztreonam  2/12>>> 2/14 Acyclovir>>>2/12  levaquin 2/12->> 2/15 Oseltamavir 2/13 >> Stop date 2/20 MEJII 2/15 >> Ceftriaxone 2/15 >>  SIGNIFICANT EVENTS: Intubated in ED Shock Refractory hypoxia 2/13 > weaning Vent overnight.  2/15 > CVA on MRI 2/16 > Left CC thrombus, started on Heparin.   LINES/TUBES: Right subclavian CVC 2/12>> 2/15 Foley 2/12>> ETT 2/12>> A line 2/12>>>2/14   Physical Exam:  General: more responsive off sedation, pERRLA Neuro:  intubated, flicker movement Lue/LLE - follows commands HEENT:jvd, ETT, NG tube, cough +, gag + PULM: coarse throughout Improving aeration.  CV: s1 s2 RRT, No MGR GI: soft, bs wnl, nondistended.  Extremities: no edema, boots in place.    DISCUSSION: 60 y/o with influenza A and right sided pneumonia -strep pyogenes , in significant respiratory distress, intubated for hypoxic respiratory failure found to have L Frontal CVA and LCC thrombus.   ASSESSMENT / PLAN:  PULMONARY A: Pneumonia (necrotizing?) Dense infiltrate- continued daily improvements resp alk mild P:  SBTs but no extubation with current MS Goal fluid balance negative to even  CARDIOVASCULAR A:  Septic shock - resolved SVT (history supports fib at home) adequate cortisol response H./o afib? > a flutter with RVR resolved.  Distal CC thrombus TTE > mildly elevated PA pressure, otherwise mostly normal > unable to do TEE  P:  Lopressor prn . Lasix to neg May need amio if irregular rhythm returns Heparin gtt   RENAL A:  AKI. Hyponatremia - resolved NONAG met acidosis- saline   Recent Labs Lab 06/03/15 1830 06/04/15 0035 06/05/15 0234  NA 136 139 138    P:  Lasix reduced, keep same dose  k goal greater 4 with svt prior  GASTROINTESTINAL A:  Question of esophageal stricture, sjorgens P:  Cortrak placed. Tube  feeds to goal- hold for tee Protonix.  LFT normal.  Cards aware of esoph concerns  INFECTIOUS A:  Pneumonia (r/o different organism than group A strep ) Doubt infectious clot Flu A P:  tamiflu x 7 days STOP date 2/20 Ceftriaxone ct Continued neb Aminoglycoside study drug ID on board and recommending lengthy abx course. Unable to perform TEE - ? Prolonged abx vs EGD then rettempt  Endocrine: A:  Hypothyroidism, c/w sick euthryoid Hyperglycemia P:  IV synthroid ordered, keep same Treat underlying condition repeat T3 T4 in 2 months Moderate SSI Lantus 10 u qd.   NEUROLOGIC A:  Severe hypoxia requiring paralysis On presentation weakness CVA on MRI Brain c/w right sided deficits.  Left CC A thrombus identified on CTA (doubt infectious)  P:  RASS goal:0 EEG spike activity from left frontal lobe > cva area, not seziure Heparin gtt Statin when able.  TEE when able.  Improving neurological prognosis but expect debilitation   FAMILY  - Updates: husband daily  - Inter-disciplinary family meet or Palliative Care meeting due by: day 7        Summary - Gr A strep pna complicated by left CVA & left carotid thrombus ? Embolic, doubt endocarditis but TEE unable, profound weakness  The patient is critically ill with multiple organ systems failure and requires high complexity decision making  for assessment and support, frequent evaluation and titration of therapies, application of advanced monitoring technologies and extensive interpretation of multiple databases. Critical Care Time devoted to patient care services described in this note independent of APP time is 35 minutes.   Kara Mead MD. [place] Flood. [facility] Pager [phone] If no response call [phone]   06/05/2015 8:32 AM

## 2015-06-05 NOTE — Progress Notes (Signed)
[place] [facility] Progress Note Patient Name: [name] [name] DOB: January 28, 1956 MRN: [id number]   Date of Service  06/05/2015  HPI/Events of Note  Called d/t elevated HR = 110's. Review of bedside monitor reveals sinus tachycardia with PAC's.  eICU Interventions  Will order: 1. BMP and Mg++ level now.      Intervention Category Major Interventions: Arrhythmia - evaluation and management  Gaege Sangalang Cornelia Copa 06/05/2015, 9:27 PM

## 2015-06-05 NOTE — Progress Notes (Signed)
Nebulized research treatment performed with neb start time at 0940 and ending at 0944 with 8mL left in canister.  During treatment patient tidal volume was increased to 440 (patient's 8cc), rate of 12 and I time of 1.30 for I:E ratio of 1:1.  Bias flow is unknown.  Filters changed at 0945.  Patient tolerated well with current vitals of HR: 80, RR: 27 SATS: 97%.  Patient returned to previous ventilator settings at 0946.

## 2015-06-05 NOTE — Research (Signed)
ME1100-CL-103:  This RN contacted the ME1100-CL-103 Hotline on behalf of [facility], Dr. Merrie Roof, on [phone] at 1420.  Purpose to discuss use of study antibiotic in relationship to respiratory culture results and current systemic antibiotics.  This RN spoke with Dr. Jonathon Resides, Medical Monitor for ME1100-CL-103 at 15:18.  Dr. Percell Miller indicated that is unknown if study antibiotic will treat Strep A bacteria.  However, this antibiotic, in IV form, has an indication in [place] to treat skin infections.  Dr. Percell Miller also confirmed that the study antibiotic is synergistic with beta lactam antibiotics.  Additionally, to date, the systemic concentration of the nebulized study drug has been low.  This RN communicated above information to Dr. Titus Mould at 15:22.  Dr. Titus Mould determined to continue BID study antibiotic per protocol.  Doreatha Martin, RN

## 2015-06-05 NOTE — Progress Notes (Signed)
STROKE TEAM PROGRESS NOTE   History at time of admission [name] [name] is an 60 y.o. female patient who has been sick with influenza symptoms for the past 4-5 Days with worsening generalized weakness ,. Poor by mouth intake, Per her husband. Today around 8:30 PM when she got up to go to bathroom she had give severe gait instability and couldn't stand up due to generalized weakness. She'll brought into the ER by EMS for further evaluation. Was diagnosed with flu yesterday and started on Tamiflu. History of multiple autoimmune conditions with Sjgren's, rheumatoid arthritis, on plaquenil and prednisone at home.  Patient with severe restrictive distress when she presented to the ER with oxygen saturation in 70s while on nonrebreathing mask. With 100% BiPAP, her O2 sats improved to 91%, Continues to be in severe distress.    Date last known well: Generalized weakness for 4-5 days, Last normal prior to that Time last known well: unknown tPA Given: No: Clinically not suggestive of acute stroke. Last normal greater than 4-5 days ago.     SUBJECTIVE (INTERVAL HISTORY) No family is at bedside. Patient's RN reports that patient has been more responsive, squeezed fingers, opened eyes and wiggled toes for nurse. Patient did not respond to me today however.   OBJECTIVE Temp:  [98.6 F (37 C)-99.7 F (37.6 C)] 99 F (37.2 C) (02/18 1208) Pulse Rate:  [70-95] 76 (02/18 1200) Cardiac Rhythm:  [-] Normal sinus rhythm (02/18 1200) Resp:  [17-27] 18 (02/18 1200) BP: (100-143)/(51-81) 118/61 mmHg (02/18 1200) SpO2:  [93 %-100 %] 100 % (02/18 1200) FiO2 (%):  [40 %] 40 % (02/18 1200) Weight:  [69.9 kg (154 lb 1.6 oz)] 69.9 kg (154 lb 1.6 oz) (02/18 0500)  CBC:  Recent Labs Lab 05/30/15 0500  06/01/15 1140  06/04/15 0035 06/05/15 0234  WBC 13.8*  < > 40.1*  < > 22.6* 21.2*  NEUTROABS 12.7*  --  35.3*  --   --   --   HGB 13.6  < > 11.0*  < > 11.1* 11.4*  HCT 40.9  < > 32.9*  < >  33.3* 35.5*  MCV 85.9  < > 83.5  < > 83.5 86.8  PLT 266  < > 121*  < > 122* 156  < > = values in this interval not displayed.  Basic Metabolic Panel:   Recent Labs Lab 06/03/15 1830 06/04/15 0035 06/05/15 0234  NA 136 139 138  K 3.2* 3.7 4.4  CL 103 106 105  CO2 26 25 25   GLUCOSE 175* 190* 175*  BUN 22* 22* 21*  CREATININE 0.59 0.49 0.45  CALCIUM 8.1* 8.2* 8.4*  MG 1.8  --  1.9  PHOS 3.5  --  3.0    Lipid Panel:     Component Value Date/Time   CHOL 95 06/03/2015 1830   TRIG 72 06/03/2015 1830   HDL 16* 06/03/2015 1830   CHOLHDL 5.9 06/03/2015 1830   VLDL 14 06/03/2015 1830   LDLCALC 65 06/03/2015 1830   HgbA1c:  Lab Results  Component Value Date   HGBA1C 6.6* 06/04/2015   Urine Drug Screen:     Component Value Date/Time   LABOPIA POSITIVE* 05/30/2015 0558   COCAINSCRNUR NONE DETECTED 05/30/2015 0558   LABBENZ NONE DETECTED 05/30/2015 0558   AMPHETMU NONE DETECTED 05/30/2015 0558   THCU NONE DETECTED 05/30/2015 0558   LABBARB NONE DETECTED 05/30/2015 0558      IMAGING I have personally reviewed the radiological images below and agree with  the radiology interpretations.  Mr Brain Wo Contrast 06/03/2015   Acute small LEFT frontal lobe (precentral gyrus, MCA territory) infarct. Mild chronic small vessel ischemic disease.   2D Echocardiogram  - Left ventricle: The cavity size was normal. Systolic function wasnormal. The estimated ejection fraction was in the range of 60%to 65%. Wall motion was normal; there were no regional wallmotion abnormalities. - Mitral valve: There was mild regurgitation. - Tricuspid valve: There was mild regurgitation. - Pulmonic valve: Poorly visualized. - Pulmonary arteries: PA peak pressure: 35 mm Hg (S). Impressions:  - There was no evidence of a vegetation.  CTA head and neck -  1. 7 mm filling defect in the distal left common carotid artery consistent with a small focus of thrombus. 2. Otherwise unremarkable appearance of  the cervical carotid and vertebral arteries. 3. No major intracranial arterial branch occlusion or significant stenosis. Slight attenuation of distal left MCA branch vessels in the region of the left frontal infarct. 4. Extensive right lung consolidation, incompletely visualized. Small right pleural effusion.  TEE - pending   Physical exam  Temp:  [98.6 F (37 C)-99.7 F (37.6 C)] 99 F (37.2 C) (02/18 1208) Pulse Rate:  [70-95] 76 (02/18 1200) Resp:  [17-27] 18 (02/18 1200) BP: (100-143)/(51-81) 118/61 mmHg (02/18 1200) SpO2:  [93 %-100 %] 100 % (02/18 1200) FiO2 (%):  [40 %] 40 % (02/18 1200) Weight:  [69.9 kg (154 lb 1.6 oz)] 69.9 kg (154 lb 1.6 oz) (02/18 0500)  General - Well nourished, well developed, intubated on sedation.  Ophthalmologic - Fundi not visualized due to positioning and ET tube.  Cardiovascular - Regular rate and rhythm.  Neuro exam stable today   Neuro - intubated on sedation, did appear to squeeze my fingers on command today left hand. Not open eyes on pain stimulation. PERRL, slight doll's eyes, but positive corneal and gag, conjugate gaze. Breathing over the vent. On pain stimulation, slight withdraw BLE, but not BUE. Right UE decreased muscle tone, and LUE increased muscle tone. Toes equiv. Sensation, coordination or gait not tested.   ASSESSMENT/PLAN [name] is a 60 y.o. female with history of Sjogren's disease, seizure, hypothyroidism and dysphagia, diagnosed with the flu 4 days prior to arrival, presenting with weakness and altered mental status. Code stroke called on arrival due to right facial droop and flaccid paralysis R arm. In the ED she was found to have hypooxic respiratory failure. LP negative for CNS infection. MRI showed left MCA embolic stroke.  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 Endocarditis vs. Aflutter/afib vs. hyercoagulable state due to severe infection. Need to rule out antiphospholipid  syndrome in the future.  Resultant  RUE plegia  MRI  small L frontal lobe MCA infart  CTA head and neck - left CCA thrombus, no mycotic aneurysm.  2D Echo  No SOE, vegetation  TEE to be scheduled to eval for endocarditis  EEG spike activity from the left frontal lobe, likely associated with current stroke  LDL 65  HgbA1c 6.6  Hypercoagulable work up pending  Heparin IV for VTE prophylaxis Diet NPO time specified  No antithrombotic prior to admission, now on Heparin drip. Due to CCA thrombus, aflutter and no mycotic aneurysm seen, start on heparin drip low intensity for anticoagulation.   Ongoing aggressive stroke risk factor management  Therapy recommendations:  pending   Disposition:  pending   Respiratory Failure  Shock  Refractory hypoxia  Pneumonia / FLU  Intubated on admission  in ED  CTA neck showed extensive right LL consolidation  Flu diagnosis 4 days prior to admission, started on Tamiflu at that time  On rocephin and tamiflu  WBC trending down  Afebrile overnight  Atrial Flutter  EKG shows atrial flutter with variable A-V block 2/15 around 8p  Start heparin drip for anticoagulation in the setting of left CCA thrombus.  SVT on admission   As per husband, pt did have once heart palpitation at home before admission  ? Endocarditis  Will recommend TEE ASAP to rule out - pending  No mycotic aneurysm seen on CTA head  Blood cultures 2/12 Group a strep pan sensitive, 2/14 neg x 48h, 2/15 ng x 24h  On heparin drip as no mytoctic aneurysm, and culture more than 48 hours no growth  Sjogren syndrome  On plaquenil and steroids at home, currently off  Hypercoagulable and autoimmune work up to rule out antiphospholipid syndrome.  LUE increased tone  Unclear etiology  EEG no seizure seen but left frontal spike activity - not fit to the picture  Need repeat EEG Monday - ordered  Hypotension, resolved Hx  hypertension  Stable  Hyperlipidemia  Home meds:  No statin  Now on lipitor 40 mg daily  LDL 65  LDL goal < 70  Continue statin at discharge  Other Stroke Risk Factors  Advanced age  Other Active Problems  Hypothyroidism  Hx Dysphagia prior to admission  AKI and hyponatremia resolved  ? Esophageal stricture secondary to [facility] day # [place] PA-C [facility] Pager [phone] 06/05/2015, 1:16 PM   Personally examined patient and images, and have participated in and made any corrections needed to history, physical, neuro exam,assessment and plan as stated above.  I have personally obtained the history, evaluated lab date, reviewed imaging studies and agree with radiology interpretations.   This patient is critically ill due to left MCA stroke, respiratory failure and left CCA thrombus and at significant risk of neurological worsening, death form recurrent stroke, hemorrhagic transformation, cardiac failure, sepsis, DIC. This patient's care requires constant monitoring of vital signs, hemodynamics, respiratory and cardiac monitoring, review of multiple databases, neurological assessment, discussion with family, other specialists and medical decision making of high complexity. I spent 30 minutes of neurocritical care time in the care of this patient.    Sarina Ill, MD Stroke Neurology [phone] [facility]       To contact Stroke Continuity provider, please refer to [email or web address] After hours, contact General Neurology

## 2015-06-05 NOTE — Progress Notes (Addendum)
ANTICOAGULATION CONSULT NOTE - Follow Up Consult  Pharmacy Consult for heparin Indication: aflutter  Allergies  Allergen Reactions  . Erythromycin Ethylsuccinate Hives  . Keflex [Cephalexin] Hives  . Penicillins   . Sulfa Antibiotics     Patient Measurements: Height: 5\' 2"  (157.5 cm) Weight: 154 lb 1.6 oz (69.9 kg) IBW/kg (Calculated) : 50.1 Heparin Dosing Weight: 65 kg  Vital Signs: Temp: 99.3 F (37.4 C) (02/18 1957) Temp Source: Oral (02/18 1957) BP: 120/59 mmHg (02/18 1800) Pulse Rate: 94 (02/18 1800)  Labs:  Recent Labs  06/03/15 1830 06/04/15 0035  06/05/15 0234 06/05/15 1203 06/05/15 1524 06/05/15 1933  HGB  --  11.1*  --  11.4*  --  11.1*  --   HCT  --  33.3*  --  35.5*  --  35.5*  --   PLT  --  122*  --  156  --  198  --   HEPARINUNFRC  --   --   < > 0.40 0.27*  --  0.35  CREATININE 0.59 0.49  --  0.45  --   --   --   < > = values in this interval not displayed.  Estimated Creatinine Clearance: 66.8 mL/min (by C-G formula based on Cr of 0.45).   Assessment: 60 yo f admitted on 2/11 with respiratory distress.  Pharmacy is consulted to dose heparin infusion for aflutter and CCA thrombus. 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  HL this afternoon is subtherapeutic at 0.27 on 500 units/hr.  CBC stable, no issues per RN.  Repeat HL is now therapeutic at 0.35 on heparin 500 units/hr. Spoke with the nurse and patient still has constant level of bright red urine. Repeat CBC was obtained and remains stable.   Goal of Therapy:  Heparin level 0.3-0.5 units/ml Monitor platelets by anticoagulation protocol: Yes   Plan:  Heparin 500 units/hr Daily HL Monitor s/s of bleeding   Andrey Cota. Diona Foley, PharmD, [place] Clinical Pharmacist Pager [phone]  06/05/2015 8:02 PM

## 2015-06-05 NOTE — Progress Notes (Signed)
Nebulized research treatment performed with neb start time at 2150 and ending at 2216 with none left in canister. During treatment patient tidal volume was increased to 400 (patient's 8cc), rate of 12 and I time of 1.75 for I:E ratio of 1:1. Bias flow is unknown. Filters changed at 2216. Patient tolerated well. Patient returned to previous ventilator settings at 2216.  Treatment was interrupted at 2154 when it was noticed that no med was being nebulized.  Consulted research RN about neb function, she obtained new nebulizer.  Med was transferred to new neb cup with droplets remaining in old cup. Med restarted at 2208, ending at 2216.  New nebulizer operated without difficulty.

## 2015-06-05 NOTE — Progress Notes (Signed)
[name] Progress Note Patient Name: [name] [name] DOB: 1956/04/13 MRN: [id number]   Date of Service  06/05/2015  HPI/Events of Note  Hematuria - Urine blood tinged. Last Hgb = 11.4. Patient is on a Heparin IV infusion for embolic CVA and AFIB.   eICU Interventions  Will order CBC now. If Hgb has decreased significantly or urine appears worse, will have to hold the Heparin IV infusion until bleeding clears.      Intervention Category Intermediate Interventions: Other:  Lysle Dingwall 06/05/2015, 3:16 PM

## 2015-06-05 NOTE — Progress Notes (Addendum)
ANTICOAGULATION CONSULT NOTE - Follow Up Consult  Pharmacy Consult for heparin Indication: aflutter  Allergies  Allergen Reactions  . Erythromycin Ethylsuccinate Hives  . Keflex [Cephalexin] Hives  . Penicillins   . Sulfa Antibiotics     Patient Measurements: Height: 5\' 2"  (157.5 cm) Weight: 154 lb 1.6 oz (69.9 kg) IBW/kg (Calculated) : 50.1 Heparin Dosing Weight: 65 kg  Vital Signs: Temp: 99 F (37.2 C) (02/18 1208) Temp Source: Oral (02/18 1208) BP: 118/61 mmHg (02/18 1200) Pulse Rate: 76 (02/18 1200)  Labs:  Recent Labs  06/03/15 0400 06/03/15 1830 06/04/15 0035  06/04/15 1836 06/05/15 0234 06/05/15 1203  HGB 11.3*  --  11.1*  --   --  11.4*  --   HCT 32.9*  --  33.3*  --   --  35.5*  --   PLT 103*  --  122*  --   --  156  --   HEPARINUNFRC  --   --   --   < > 0.68 0.40 0.27*  CREATININE 0.57 0.59 0.49  --   --  0.45  --   < > = values in this interval not displayed.  Estimated Creatinine Clearance: 66.8 mL/min (by C-G formula based on Cr of 0.45).   Assessment: 60 yo f admitted on 2/11 with respiratory distress.  Pharmacy is consulted to dose heparin infusion for aflutter and CCA thrombus. 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  HL this afternoon is subtherapeutic at 0.27 on 500 units/hr.  CBC stable, no issues per RN.  Goal of Therapy:  Heparin level 0.3-0.5 units/ml Monitor platelets by anticoagulation protocol: Yes   Plan:  - Increase heparin infusion to 550 units/hr - 6-hr HL @ 2000 - Monitor s/s of bleeding  Cassie L. Nicole Kindred, PharmD PGY2 Infectious Diseases Pharmacy Resident Pager: [phone] 06/05/2015 1:36 PM  Spoke with RN again (talked to covering RN earlier when patient's RN was at lunch). She is having bright red urine all day today. Dr. Elsworth Soho aware. Called [facility] and spoke to Dr. Emmit Alexanders.  Will get stat CBC and if hgb has dropped significantly, will hold heparin.  Otherwise, will continue without rate increase. Will still  get HL tonight to make sure it is not randomly accumulating.   Cassie L. Nicole Kindred, PharmD PGY2 Infectious Diseases Pharmacy Resident Pager: [phone] 06/05/2015 3:17 PM

## 2015-06-05 NOTE — Progress Notes (Signed)
INFECTIOUS DISEASE PROGRESS NOTE  ID: [name] [name] is a 60 y.o. female with  Active Problems:   Sepsis ([place])   CAP (community acquired pneumonia)   Encephalopathy   Encounter for central line placement   Hypoxia   Lactic acidosis   Altered mental state   Weakness   Encounter for feeding tube placement   Endotracheal tube present   Endotracheally intubated   Respiratory failure ([place])   Stroke (cerebrum) ([place])   Research study patient   Encounter for diagnostic procedure   Ventilator dependence ([place])   Carotid thromboses   HLD (hyperlipidemia)  Subjective: Per nursing, more interactive, weaning  Abtx:  Anti-infectives    Start     Dose/Rate Route Frequency Ordered Stop   06/03/15 1045  cefTRIAXone (ROCEPHIN) 2 g in dextrose 5 % 50 mL IVPB  Status:  Discontinued     2 g 100 mL/hr over 30 Minutes Intravenous Every 24 hours 06/03/15 0924 06/03/15 0932   06/03/15 1000  cefTRIAXone (ROCEPHIN) 2 g in dextrose 5 % 50 mL IVPB     2 g 100 mL/hr over 30 Minutes Intravenous Every 12 hours 06/03/15 0932     06/02/15 1045  cefTRIAXone (ROCEPHIN) 1 g in dextrose 5 % 50 mL IVPB  Status:  Discontinued     1 g 100 mL/hr over 30 Minutes Intravenous Every 24 hours 06/02/15 1040 06/03/15 0924   06/01/15 0000  vancomycin (VANCOCIN) IVPB 1000 mg/200 mL premix  Status:  Discontinued     1,000 mg 200 mL/hr over 60 Minutes Intravenous Every 12 hours 05/31/15 1340 06/02/15 1040   05/31/15 2200  oseltamivir (TAMIFLU) 6 MG/ML suspension 150 mg  Status:  Discontinued     150 mg Per Tube 2 times daily 05/31/15 1111 05/31/15 1421   05/31/15 1430  oseltamivir (TAMIFLU) 6 MG/ML suspension 150 mg     150 mg Per Tube 2 times daily 05/31/15 1421 06/07/15 0959   05/31/15 1400  clindamycin (CLEOCIN) IVPB 600 mg     600 mg 100 mL/hr over 30 Minutes Intravenous 3 times per day 05/31/15 1335 06/01/15 2202   05/30/15 2300  levofloxacin (LEVAQUIN) IVPB 750 mg  Status:  Discontinued     750 mg 100 mL/hr over  90 Minutes Intravenous Every 24 hours 05/30/15 0745 06/02/15 1040   05/30/15 1300  oseltamivir (TAMIFLU) 6 MG/ML suspension 75 mg  Status:  Discontinued     75 mg Per Tube 2 times daily 05/30/15 1252 05/31/15 1111   05/30/15 1200  vancomycin (VANCOCIN) 500 mg in sodium chloride 0.9 % 100 mL IVPB  Status:  Discontinued     500 mg 100 mL/hr over 60 Minutes Intravenous Every 12 hours 05/30/15 0745 05/31/15 1340   05/30/15 0800  acyclovir (ZOVIRAX) 500 mg in dextrose 5 % 100 mL IVPB  Status:  Discontinued     500 mg 110 mL/hr over 60 Minutes Intravenous 3 times per day 05/30/15 0725 05/30/15 1245   05/30/15 0800  aztreonam (AZACTAM) 2 g in dextrose 5 % 50 mL IVPB  Status:  Discontinued     2 g 100 mL/hr over 30 Minutes Intravenous Every 8 hours 05/30/15 0745 05/31/15 1331   05/30/15 0030  acyclovir (ZOVIRAX) 500 mg in dextrose 5 % 100 mL IVPB  Status:  Discontinued     500 mg 110 mL/hr over 60 Minutes Intravenous  Once 05/30/15 0016 05/30/15 0902   05/30/15 0015  levofloxacin (LEVAQUIN) IVPB 750 mg     750  mg 100 mL/hr over 90 Minutes Intravenous  Once 05/30/15 0001 05/30/15 0232   05/30/15 0015  aztreonam (AZACTAM) 2 g in dextrose 5 % 50 mL IVPB     2 g 100 mL/hr over 30 Minutes Intravenous  Once 05/30/15 0001 05/30/15 0115   05/30/15 0015  vancomycin (VANCOCIN) IVPB 1000 mg/200 mL premix     1,000 mg 200 mL/hr over 60 Minutes Intravenous  Once 05/30/15 0001 05/30/15 0232      Medications:  Scheduled: . antiseptic oral rinse  7 mL Mouth Rinse QID  . atorvastatin  40 mg Oral q1800  . cefTRIAXone (ROCEPHIN)  IV  2 g Intravenous Q12H  . chlorhexidine gluconate  15 mL Mouth Rinse BID  . famotidine (PEPCID) IV  20 mg Intravenous Q12H  . [START ON 06/06/2015] furosemide  20 mg Intravenous Daily  . insulin aspart  0-15 Units Subcutaneous 6 times per day  . insulin glargine  10 Units Subcutaneous Daily  . levothyroxine  100 mcg Oral QAC breakfast  . ME1100 Study - arbekacin 150 mg/mL for  inhalation  300 mg Inhalation BID  . oseltamivir  150 mg Per Tube BID  . potassium chloride  40 mEq Oral Daily    Objective: Vital signs in last 24 hours: Temp:  [98.6 F (37 C)-99.7 F (37.6 C)] 99.3 F (37.4 C) (02/18 1000) Pulse Rate:  [70-95] 95 (02/18 1059) Resp:  [17-27] 23 (02/18 1000) BP: (100-143)/(51-81) 143/73 mmHg (02/18 1059) SpO2:  [93 %-98 %] 98 % (02/18 1000) FiO2 (%):  [40 %] 40 % (02/18 1059) Weight:  [69.9 kg (154 lb 1.6 oz)] 69.9 kg (154 lb 1.6 oz) (02/18 0500)   General appearance: no distress Resp: rhonchi bilaterally Cardio: regular rate and rhythm GI: normal findings: bowel sounds normal and soft, non-tender  Lab Results  Recent Labs  06/04/15 0035 06/05/15 0234  WBC 22.6* 21.2*  HGB 11.1* 11.4*  HCT 33.3* 35.5*  NA 139 138  K 3.7 4.4  CL 106 105  CO2 25 25  BUN 22* 21*  CREATININE 0.49 0.45   Liver Panel No results for input(s): PROT, ALBUMIN, AST, ALT, ALKPHOS, BILITOT, BILIDIR, IBILI in the last 72 hours. Sedimentation Rate No results for input(s): ESRSEDRATE in the last 72 hours. C-Reactive Protein No results for input(s): CRP in the last 72 hours.  Microbiology: Recent Results (from the past 240 hour(s))  Blood culture (routine x 2)     Status: None   Collection Time: 05/30/15 12:02 AM  Result Value Ref Range Status   Specimen Description BLOOD LEFT ANTECUBITAL  Final   Special Requests BOTTLES DRAWN AEROBIC AND ANAEROBIC 5CCS  Final   Culture  Setup Time   Final    GRAM POSITIVE COCCI IN CHAINS IN BOTH AEROBIC AND ANAEROBIC BOTTLES CRITICAL RESULT CALLED TO, READ BACK BY AND VERIFIED WITH: T BLACKBURN,RN AT 1214 05/30/15 BY L BENFIELD    Culture   Final    STREPTOCOCCUS PYOGENES HEALTH DEPARTMENT NOTIFIED    Report Status 06/02/2015 FINAL  Final   Organism ID, Bacteria STREPTOCOCCUS PYOGENES  Final      Susceptibility   Streptococcus pyogenes - MIC*    CLINDAMYCIN <=0.25 SENSITIVE Sensitive     AMPICILLIN <=0.25  SENSITIVE Sensitive     ERYTHROMYCIN <=0.12 SENSITIVE Sensitive     VANCOMYCIN 0.25 SENSITIVE Sensitive     CEFTRIAXONE <=0.12 SENSITIVE Sensitive     LEVOFLOXACIN 0.5 SENSITIVE Sensitive     * STREPTOCOCCUS PYOGENES  Blood  culture (routine x 2)     Status: None   Collection Time: 05/30/15 12:53 AM  Result Value Ref Range Status   Specimen Description BLOOD RIGHT HAND  Final   Special Requests BOTTLES DRAWN AEROBIC ONLY 5CCS  Final   Culture  Setup Time   Final    GRAM POSITIVE COCCI IN CHAINS AEROBIC BOTTLE ONLY CRITICAL RESULT CALLED TO, READ BACK BY AND VERIFIED WITH: T BLACKBURN,RN AT 1301 05/30/15 BY L BENFIELD    Culture   Final    GROUP A STREP (S.PYOGENES) ISOLATED SUSCEPTIBILITIES PERFORMED ON PREVIOUS CULTURE WITHIN THE LAST 5 DAYS. HEALTH DEPARTMENT NOTIFIED    Report Status 06/02/2015 FINAL  Final  MRSA PCR Screening     Status: None   Collection Time: 05/30/15  4:20 AM  Result Value Ref Range Status   MRSA by PCR NEGATIVE NEGATIVE Final    Comment:        The GeneXpert MRSA Assay (FDA approved for NASAL specimens only), is one component of a comprehensive MRSA colonization surveillance program. It is not intended to diagnose MRSA infection nor to guide or monitor treatment for MRSA infections.   Urine culture     Status: None   Collection Time: 05/30/15  5:58 AM  Result Value Ref Range Status   Specimen Description URINE, CATHETERIZED  Final   Special Requests NONE  Final   Culture NO GROWTH 1 DAY  Final   Report Status 05/31/2015 FINAL  Final  CSF culture     Status: None   Collection Time: 05/30/15  6:38 AM  Result Value Ref Range Status   Specimen Description CSF  Final   Special Requests NONE  Final   Gram Stain   Final    WBC PRESENT, PREDOMINANTLY MONONUCLEAR NO ORGANISMS SEEN CYTOSPIN SMEAR    Culture NO GROWTH 3 DAYS  Final   Report Status 06/02/2015 FINAL  Final  Culture, respiratory (NON-Expectorated)     Status: None   Collection Time:  06/01/15 10:35 AM  Result Value Ref Range Status   Specimen Description SPUTUM  Final   Special Requests NONE  Final   Gram Stain   Final    ABUNDANT WBC PRESENT,BOTH PMN AND MONONUCLEAR FEW SQUAMOUS EPITHELIAL CELLS PRESENT NO ORGANISMS SEEN Performed at [facility]    Culture   Final    RARE GROUP A STREP (S.PYOGENES) ISOLATED Performed at [facility]    Report Status 06/04/2015 FINAL  Final  Culture, blood (single)     Status: None (Preliminary result)   Collection Time: 06/01/15  2:32 PM  Result Value Ref Range Status   Specimen Description BLOOD RIGHT HAND  Final   Special Requests IN PEDIATRIC BOTTLE  4CC  Final   Culture NO GROWTH 3 DAYS  Final   Report Status PENDING  Incomplete  Culture, blood (routine x 2)     Status: None (Preliminary result)   Collection Time: 06/02/15  1:24 PM  Result Value Ref Range Status   Specimen Description BLOOD RIGHT ARM  Final   Special Requests BOTTLES DRAWN AEROBIC ONLY 5CC  Final   Culture NO GROWTH 2 DAYS  Final   Report Status PENDING  Incomplete  Culture, blood (routine x 2)     Status: None (Preliminary result)   Collection Time: 06/02/15  1:26 PM  Result Value Ref Range Status   Specimen Description BLOOD RIGHT ARM  Final   Special Requests BOTTLES DRAWN AEROBIC ONLY 5CC  Final  Culture NO GROWTH 2 DAYS  Final   Report Status PENDING  Incomplete    Studies/Results: Ct Angio Head W/cm &/or Wo Cm  06/03/2015  CLINICAL DATA:  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 Right facial droop and right arm paralysis. History of Sjogren's disease. EXAM: CT ANGIOGRAPHY HEAD AND NECK TECHNIQUE: Multidetector CT imaging of the head and neck was performed using the standard protocol during bolus administration of intravenous contrast. Multiplanar CT image reconstructions and MIPs were obtained to evaluate the vascular anatomy. Carotid stenosis measurements (when applicable) are obtained utilizing NASCET criteria,  using the distal internal carotid diameter as the denominator. CONTRAST:  41mL OMNIPAQUE IOHEXOL 350 MG/ML SOLN COMPARISON:  Brain MRI 06/03/2015 FINDINGS: CT HEAD Brain: Focal hypoattenuation in the posterior left frontal lobe involving the precentral gyrus corresponds to the acute infarct described on the recent MRI without interval change in size. Ventricles are normal in size. There is no evidence of acute intracranial hemorrhage, mass, midline shift, or extra-axial fluid collection. Calvarium and skull base: No fracture or destructive skull lesion. Paranasal sinuses: Small amount of fluid in the left maxillary and left sphenoid sinuses. Mild right maxillary sinus mucosal thickening. Small bilateral mastoid effusions. Orbits: Unremarkable. CTA NECK Aortic arch: 3 vessel aortic arch. Brachiocephalic and subclavian arteries are widely patent. Right carotid system: Patent without evidence of stenosis, dissection, or significant atherosclerosis. Left carotid system: There is a 7 x 3 mm filling defect projecting from the medial wall of the distal common carotid artery into the lumen just proximal to the bifurcation. The remainder of the common carotid artery as well as the cervical ICA are unremarkable allowing for slight limitation due to prominent metallic dental streak artifact through the mid ICA. Vertebral arteries: The vertebral arteries are widely patent with the left being slightly larger than the right. Skeleton: Mild-to-moderate C5-6 disc degeneration. Other neck: Endotracheal tube in place. Ground-glass and consolidative opacities with air bronchograms partially visualized in the right upper lobe. Right pleural effusion partially visualized. Enteric tube partially visualized. Small or absent thyroid. Both submandibular glands appear atrophic. CTA HEAD Anterior circulation: The internal carotid arteries are patent from skullbase to carotid termini the without stenosis. Minimal calcified plaque is noted at  the anterior genu on the right. ACAs and MCAs are patent without evidence of significant stenosis or major branch occlusion. There is slight asymmetric attenuation of small, distal left MCA branch vessels in the area of acute infarction compared to the contralateral side. No intracranial aneurysm is identified. Posterior circulation: The intracranial vertebral arteries are patent with the left being dominant. The right vertebral artery is particularly hypoplastic distal to the PICA origin. PICA, AICA, and SCA origins are patent. The basilar artery is small in caliber diffusely without evidence of a significant focal stenosis. There are fetal type origins of both PCAs. No significant PCA stenosis is seen. Venous sinuses: Patent. Anatomic variants: Fetal origins of the PCAs. Delayed phase: No abnormal enhancement. IMPRESSION: 1. 7 mm filling defect in the distal left common carotid artery consistent with a small focus of thrombus. 2. Otherwise unremarkable appearance of the cervical carotid and vertebral arteries. 3. No major intracranial arterial branch occlusion or significant stenosis. Slight attenuation of distal left MCA branch vessels in the region of the left frontal infarct. 4. Extensive right lung consolidation, incompletely visualized. Small right pleural effusion. These results were called by telephone at the time of interpretation on 06/03/2015 at 4:18 pm to Dr. Lake Bells, who verbally acknowledged these results.  Electronically Signed   By: Logan Bores M.D.   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 Right facial droop and right arm paralysis. History of Sjogren's disease. EXAM: CT ANGIOGRAPHY HEAD AND NECK TECHNIQUE: Multidetector CT imaging of the head and neck was performed using the standard protocol during bolus administration of intravenous contrast. Multiplanar CT image reconstructions and MIPs were  obtained to evaluate the vascular anatomy. Carotid stenosis measurements (when applicable) are obtained utilizing NASCET criteria, using the distal internal carotid diameter as the denominator. CONTRAST:  56mL OMNIPAQUE IOHEXOL 350 MG/ML SOLN COMPARISON:  Brain MRI 06/03/2015 FINDINGS: CT HEAD Brain: Focal hypoattenuation in the posterior left frontal lobe involving the precentral gyrus corresponds to the acute infarct described on the recent MRI without interval change in size. Ventricles are normal in size. There is no evidence of acute intracranial hemorrhage, mass, midline shift, or extra-axial fluid collection. Calvarium and skull base: No fracture or destructive skull lesion. Paranasal sinuses: Small amount of fluid in the left maxillary and left sphenoid sinuses. Mild right maxillary sinus mucosal thickening. Small bilateral mastoid effusions. Orbits: Unremarkable. CTA NECK Aortic arch: 3 vessel aortic arch. Brachiocephalic and subclavian arteries are widely patent. Right carotid system: Patent without evidence of stenosis, dissection, or significant atherosclerosis. Left carotid system: There is a 7 x 3 mm filling defect projecting from the medial wall of the distal common carotid artery into the lumen just proximal to the bifurcation. The remainder of the common carotid artery as well as the cervical ICA are unremarkable allowing for slight limitation due to prominent metallic dental streak artifact through the mid ICA. Vertebral arteries: The vertebral arteries are widely patent with the left being slightly larger than the right. Skeleton: Mild-to-moderate C5-6 disc degeneration. Other neck: Endotracheal tube in place. Ground-glass and consolidative opacities with air bronchograms partially visualized in the right upper lobe. Right pleural effusion partially visualized. Enteric tube partially visualized. Small or absent thyroid. Both submandibular glands appear atrophic. CTA HEAD Anterior circulation: The  internal carotid arteries are patent from skullbase to carotid termini the without stenosis. Minimal calcified plaque is noted at the anterior genu on the right. ACAs and MCAs are patent without evidence of significant stenosis or major branch occlusion. There is slight asymmetric attenuation of small, distal left MCA branch vessels in the area of acute infarction compared to the contralateral side. No intracranial aneurysm is identified. Posterior circulation: The intracranial vertebral arteries are patent with the left being dominant. The right vertebral artery is particularly hypoplastic distal to the PICA origin. PICA, AICA, and SCA origins are patent. The basilar artery is small in caliber diffusely without evidence of a significant focal stenosis. There are fetal type origins of both PCAs. No significant PCA stenosis is seen. Venous sinuses: Patent. Anatomic variants: Fetal origins of the PCAs. Delayed phase: No abnormal enhancement. IMPRESSION: 1. 7 mm filling defect in the distal left common carotid artery consistent with a small focus of thrombus. 2. Otherwise unremarkable appearance of the cervical carotid and vertebral arteries. 3. No major intracranial arterial branch occlusion or significant stenosis. Slight attenuation of distal left MCA branch vessels in the region of the left frontal infarct. 4. Extensive right lung consolidation, incompletely visualized. Small right pleural effusion. These results were called by telephone at the time of interpretation on 06/03/2015 at 4:18 pm to Dr. Lake Bells, who verbally acknowledged these results. Electronically Signed  By: Logan Bores M.D.   On: 06/03/2015 16:22   Dg Chest Port 1 View  06/04/2015  CLINICAL DATA:  Hypoxia EXAM: PORTABLE CHEST 1 VIEW COMPARISON:  June 03, 2015 FINDINGS: Endotracheal tube tip is 2.7 cm above the carina. Feeding tube tip is below the diaphragm. No pneumothorax. There is extensive airspace consolidation in portions of the right  mid and lower lung zones, slightly less than 1 day prior. Mild patchy infiltrate in the left base is stable. No new opacity is seen. Heart is upper normal in size with pulmonary vascularity within normal limits. IMPRESSION: Extensive airspace consolidation throughout right mid and lower lung zones, slightly less than 1 day prior. Suspect pneumonia. Mild patchy infiltrate in the left base is also present, stable. No change in cardiac silhouette. Tube and catheter positions are as described without pneumothorax. Electronically Signed   By: Lowella Grip III M.D.   On: 06/04/2015 07:57   Dg Chest Port 1 View  06/03/2015  CLINICAL DATA:  Post bronchoscopy, intubation EXAM: PORTABLE CHEST 1 VIEW COMPARISON:  Portable exam 1624 hours compared to 0457 hours FINDINGS: Tip of endotracheal tube projects 5.4 cm above carina. Nasogastric tube extends to at least mid stomach. Stable heart size and mediastinal contours for rotation to the LEFT. Slight pulmonary vascular congestion. Diffuse airspace infiltrate RIGHT upper and RIGHT lower lobes compatible with pneumonia. Minimal LEFT lower lobe infiltrate questioned. No pneumothorax. Bones unremarkable. IMPRESSION: Persistent diffuse RIGHT lung infiltrates with question minimal LEFT lower lobe infiltrate favoring pneumonia. Electronically Signed   By: Lavonia Dana M.D.   On: 06/03/2015 16:40     Assessment/Plan: Group A Strep Bacteremia Will not be able to get TEE Would recommend 6 weeks of ceftriaxone Repeat BCx are ngtd/pending Available as needed  Cerebral Embolism (L frontal MCA) On low dose heparin  Pneumonia Influenza tamiflu as per CCM (has completed 6 days) Stop isolation at day 7 (uptodate) CXR improving On study drug  Leukocytosis (steroid effect) Improving on CXR  Total days of antibiotics: 7/42 ceftriaxone         Bobby Rumpf Infectious Diseases (pager) [phone] [email or web address] 06/05/2015, 11:16 AM  LOS: 6 days

## 2015-06-06 ENCOUNTER — Inpatient Hospital Stay (HOSPITAL_COMMUNITY): Payer: BC Managed Care – PPO

## 2015-06-06 DIAGNOSIS — J101 Influenza due to other identified influenza virus with other respiratory manifestations: Secondary | ICD-10-CM

## 2015-06-06 LAB — BASIC METABOLIC PANEL
Anion gap: 6 (ref 5–15)
BUN: 23 mg/dL — AB (ref 6–20)
CHLORIDE: 105 mmol/L (ref 101–111)
CO2: 26 mmol/L (ref 22–32)
CREATININE: 0.45 mg/dL (ref 0.44–1.00)
Calcium: 8.3 mg/dL — ABNORMAL LOW (ref 8.9–10.3)
GFR calc Af Amer: >60 mL/min (ref >60)
GFR calc non Af Amer: >60 mL/min (ref >60)
Glucose, Bld: 132 mg/dL — ABNORMAL HIGH (ref 65–99)
Potassium: 4 mmol/L (ref 3.5–5.1)
SODIUM: 137 mmol/L (ref 135–145)

## 2015-06-06 LAB — HEPARIN LEVEL (UNFRACTIONATED)
HEPARIN UNFRACTIONATED: 0.23 [IU]/mL — AB (ref 0.30–0.70)
Heparin Unfractionated: 0.18 IU/mL — ABNORMAL LOW (ref 0.30–0.70)
Heparin Unfractionated: 0.2 IU/mL — ABNORMAL LOW (ref 0.30–0.70)

## 2015-06-06 LAB — CULTURE, BLOOD (SINGLE): CULTURE: NO GROWTH

## 2015-06-06 LAB — GLUCOSE, CAPILLARY
GLUCOSE-CAPILLARY: 133 mg/dL — AB (ref 65–99)
GLUCOSE-CAPILLARY: 140 mg/dL — AB (ref 65–99)
GLUCOSE-CAPILLARY: 147 mg/dL — AB (ref 65–99)
GLUCOSE-CAPILLARY: 173 mg/dL — AB (ref 65–99)
Glucose-Capillary: 152 mg/dL — ABNORMAL HIGH (ref 65–99)
Glucose-Capillary: 120 mg/dL — ABNORMAL HIGH (ref 65–99)

## 2015-06-06 LAB — CBC
HEMATOCRIT: 33.3 % — AB (ref 36.0–46.0)
Hemoglobin: 10.3 g/dL — ABNORMAL LOW (ref 12.0–15.0)
MCH: 27.5 pg (ref 26.0–34.0)
MCHC: 30.9 g/dL (ref 30.0–36.0)
MCV: 88.8 fL (ref 78.0–100.0)
Platelets: 234 10*3/uL (ref 150–400)
RBC: 3.75 MIL/uL — ABNORMAL LOW (ref 3.87–5.11)
RDW: 16 % — AB (ref 11.5–15.5)
WBC: 24.8 10*3/uL — ABNORMAL HIGH (ref 4.0–10.5)

## 2015-06-06 LAB — CULTURE, RESPIRATORY: CULTURE: NO GROWTH

## 2015-06-06 LAB — CULTURE, RESPIRATORY W GRAM STAIN

## 2015-06-06 LAB — BETA-2-GLYCOPROTEIN I ABS, IGG/M/A
Beta-2 Glyco I IgG: <9 GPI IgG units (ref 0–20)
Beta-2-Glycoprotein I IgA: <9 GPI IgA units (ref 0–25)
Beta-2-Glycoprotein I IgM: <9 GPI IgM units (ref 0–32)

## 2015-06-06 LAB — ANTINUCLEAR ANTIBODIES, IFA: ANTINUCLEAR ANTIBODIES, IFA: NEGATIVE

## 2015-06-06 MED ORDER — PANTOPRAZOLE SODIUM 40 MG PO PACK
40.0000 mg | PACK | Freq: Every day | ORAL | Status: DC
  Administered 2015-06-06 – 2015-06-16 (×11): 40 mg
  Filled 2015-06-06 (×11): qty 20

## 2015-06-06 MED ORDER — FENTANYL CITRATE (PF) 100 MCG/2ML IJ SOLN
25.0000 ug | INTRAMUSCULAR | Status: DC | PRN
  Administered 2015-06-06: 25 ug via INTRAVENOUS
  Administered 2015-06-07: 50 ug via INTRAVENOUS
  Administered 2015-06-07: 25 ug via INTRAVENOUS
  Filled 2015-06-06 (×3): qty 2

## 2015-06-06 MED ORDER — FLUCONAZOLE 40 MG/ML PO SUSR
100.0000 mg | Freq: Every day | ORAL | Status: AC
  Administered 2015-06-06 – 2015-06-08 (×3): 100 mg
  Filled 2015-06-06 (×3): qty 2.5

## 2015-06-06 MED ORDER — METOPROLOL TARTRATE 25 MG/10 ML ORAL SUSPENSION
12.5000 mg | Freq: Two times a day (BID) | ORAL | Status: DC
  Administered 2015-06-06 – 2015-06-16 (×19): 12.5 mg
  Filled 2015-06-06 (×9): qty 5
  Filled 2015-06-06: qty 10
  Filled 2015-06-06: qty 5
  Filled 2015-06-06: qty 10
  Filled 2015-06-06 (×2): qty 5
  Filled 2015-06-06: qty 10
  Filled 2015-06-06 (×9): qty 5

## 2015-06-06 NOTE — Progress Notes (Signed)
Vent changes made to give research med.

## 2015-06-06 NOTE — Progress Notes (Signed)
Nebulized research treatment performed with neb start time at 10:02 and ending at 10:14 with 7mL left in canister. During treatment patient tidal volume was increased to 440 (patient's 8cc), rate of 12 and I time of 1.75 for I:E ratio of 1:1. Bias flow is unknown. Filters changed at 10:15. Patient tolerated well with current vitals of HR: 112, RR: 27 SATS: 98%. Patient returned to previous ventilator settings at 10:14.

## 2015-06-06 NOTE — Progress Notes (Signed)
Name:[name] [name] LMB:[id number] DOB:1955/04/22   ADMISSION DATE: 05/29/2015 CONSULTATION DATE: 05/30/15  REFERRING MD: Dr. Leonides Schanz  CHIEF COMPLAINT: Hypoxic respiratory failure  HISTORY OF PRESENT ILLNESS:  Ms/ Dula is a 60 y/o woman with a history of Sjogren's disease, hypothyroidism and dysphagia. She was diagnosed with the flu (A) about 4 days PTA and started on tamiflu. 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     OVERNIGHT EVENTS -  RASS 0 No obvious pain Low gr febrile Good UO   BP 97/62 mmHg  Pulse 95  Temp(Src) 100.9 F (38.3 C) (Core (Comment))  Resp 20  Ht 5' 2"  (1.575 m)  Wt 63.6 kg (140 lb 3.4 oz)  BMI 25.64 kg/m2  SpO2 97%     Recent Labs Lab 06/05/15 0234 06/05/15 2140 06/06/15 0240  NA 138 137 137  K 4.4 4.0 4.0  CL 105 104 105  CO2 25 25 26   BUN 21* 22* 23*  CREATININE 0.45 0.46 0.45  GLUCOSE 175* 143* 132*    Recent Labs Lab 06/05/15 0234 06/05/15 1524 06/06/15 0240  HGB 11.4* 11.1* 10.3*  HCT 35.5* 35.5* 33.3*  WBC 21.2* 21.9* 24.8*  PLT 156 198 234   CBG (last 3)   Recent Labs  06/05/15 2347 06/06/15 0400 06/06/15 0802  GLUCAP 133* 152* 147*    ABG    Component Value Date/Time   PHART 7.510* 06/04/2015 1233   PCO2ART 34.1* 06/04/2015 1233   PO2ART 76.5* 06/04/2015 1233   HCO3 26.9* 06/04/2015 1233   TCO2 27.9 06/04/2015 1233   ACIDBASEDEF 2.0 06/04/2015 0956   O2SAT 96.5 06/04/2015 1233    Imaging Dg Chest Port 1 View  06/06/2015  CLINICAL DATA:  Acute respiratory failure. Followup exam for pneumonia. EXAM: PORTABLE CHEST 1 VIEW COMPARISON:  06/04/2015 FINDINGS: Airspace consolidation in the right lung has mildly improved. There are no new areas of lung opacity. Endotracheal tube and enteric tube are stable in well positioned. IMPRESSION: 1. Mild improvement in the right lung pneumonia. 2.  No new abnormalities. No other change. Support apparatus remains well-positioned. Electronically Signed   By: Lajean Manes M.D.   On: 06/06/2015 07:53   STUDIES:  Head CT 2/12 >> no acute hemorrhagic infarct.  Chest CT 2/12 >> ground glass opacities, consolidation c/w pneumonia.  CXR 2/14 / 2/15 >> little change. R lung consolidation, ? L infiltrate.  CXR 2/16 >> improving consolidation on the right MRI Head 2/15 > L Frontal precentral gyrus CVA CTA Head / Neck 2/16 > L CC thrombus  CULTURES: Blood 2/12>>>Group A Strep pan sensitive.  Blood Cx 2/15 >> NG x 1 day.  Blood Cx 2/14 >> NG x 2days.  Urine 2/12>> negative.  CSF >> Negative  Resp 2/14 > No organisms on gram stain, culture pending  ANTIBIOTICS: Vanc 2/12>>> 2/15 aztreonam  2/12>>> 2/14 Acyclovir>>>2/12  levaquin 2/12->> 2/15 Oseltamavir 2/13 >> Stop date 2/20 MEJII 2/15 >> Ceftriaxone 2/15 >>  SIGNIFICANT EVENTS: Intubated in ED Shock Refractory hypoxia 2/13 > weaning Vent overnight.  2/15 > CVA on MRI 2/16 > Left CC thrombus, started on Heparin.   LINES/TUBES: Right subclavian CVC 2/12>> 2/15 Foley 2/12>> ETT 2/12>> A line 2/12>>>2/14   Physical Exam:  General: acutely ill, pERRLA Neuro:  intubated, flicker movement Lue/LLE , awake, follows commands HEENT:jvd, ETT, NG tube, cough +, gag + PULM: clear,Improving aeration.  CV: s1 s2 RRT, No  MGR GI: soft, bs wnl, nondistended.  Extremities: no edema, boots in place.    DISCUSSION: 60 y/o with influenza A and right sided pneumonia -strep pyogenes , in significant respiratory distress, intubated for hypoxic respiratory failure found to have L Frontal CVA and LCC thrombus.   ASSESSMENT / PLAN:  PULMONARY A: Pneumonia (necrotizing?) Dense infiltrate- continued daily improvements resp alk mild P:  SBTs but no extubation -very deconditioned & poor  MS Goal fluid balance negative to even  CARDIOVASCULAR A:  Septic shock - resolved SVT (history  supports fib at home) adequate cortisol response H./o afib? > a flutter with RVR resolved.  Distal CC thrombus TTE > mildly elevated PA pressure, otherwise mostly normal > unable to do TEE  P:  Lopressor 12.5 bid & prn with aprameters. Lasix to neg May need amio if irregular rhythm returns Heparin gtt -watch hematuria   RENAL A:  AKI. Hyponatremia - resolved NONAG met acidosis- saline   Recent Labs Lab 06/05/15 0234 06/05/15 2140 06/06/15 0240  NA 138 137 137    P:  Lasix 20 daily  k goal greater 4 with svt prior  GASTROINTESTINAL A:  Question of esophageal stricture, sjorgens P:  Cortrak -Tube feeds to goal Protonix.  LFT normal.    INFECTIOUS A:  Pneumonia (r/o different organism than group A strep ) Doubt infectious clot Flu A P:  tamiflu x 7 days STOP date 2/20 Ceftriaxone ct Continued neb Aminoglycoside study drug ID on board   TEE unable- plan for ceftx x 6 wks total - will need PICC at some point  Endocrine: A:  Hypothyroidism, c/w sick euthryoid Hyperglycemia P:  Ct synthroid  repeat T3 T4 in 2 months Moderate SSI Lantus 10 u qd. - consider dc soon  NEUROLOGIC A:  Severe hypoxia requiring paralysis On presentation weakness CVA on MRI Brain c/w right sided deficits.  Left CC A thrombus identified on CTA (doubt infectious) EEG spike activity from left frontal lobe > cva area, not seziure P:  RASS goal:0 Heparin gtt Statin when able.  Improving neurological prognosis but expect severe debilitation   FAMILY  - Updates: husband daily  - Inter-disciplinary family meet or Palliative Care meeting due by: day 7        Summary - Gr A strep pna complicated by left CVA & left carotid thrombus ? Embolic, doubt endocarditis but TEE unable, wenas but unable to extubate due to  profound weakness  The patient is critically ill with multiple organ systems failure and requires high complexity decision making for assessment  and support, frequent evaluation and titration of therapies, application of advanced monitoring technologies and extensive interpretation of multiple databases. Critical Care Time devoted to patient care services described in this note independent of APP time is 35 minutes.   Kara Mead MD. Shade Flood. [facility] Pager [phone] If no response call [phone]   06/06/2015 9:01 AM

## 2015-06-06 NOTE — Progress Notes (Signed)
ANTICOAGULATION CONSULT NOTE - Follow Up Consult  Pharmacy Consult for Heparin  Indication: A-flutter, stroke  Allergies  Allergen Reactions  . Erythromycin Ethylsuccinate Hives  . Keflex [Cephalexin] Hives  . Penicillins   . Sulfa Antibiotics     Patient Measurements: Height: 5\' 2"  (157.5 cm) Weight: 154 lb 1.6 oz (69.9 kg) IBW/kg (Calculated) : 50.1  Vital Signs: Temp: 100.6 F (38.1 C) (02/19 0310) Temp Source: Oral (02/18 1957) BP: 131/75 mmHg (02/19 0300) Pulse Rate: 87 (02/19 0310)  Labs:  Recent Labs  06/05/15 0234 06/05/15 1203 06/05/15 1524 06/05/15 1933 06/05/15 2140 06/06/15 0240  HGB 11.4*  --  11.1*  --   --  10.3*  HCT 35.5*  --  35.5*  --   --  33.3*  PLT 156  --  198  --   --  234  HEPARINUNFRC 0.40 0.27*  --  0.35  --  0.18*  CREATININE 0.45  --   --   --  0.46 0.45    Estimated Creatinine Clearance: 66.8 mL/min (by C-G formula based on Cr of 0.45).  Assessment: Heparin level sub-therapeutic this AM, Hgb 10.3, pt is having some blood in urine (MD aware)  Goal of Therapy:  Heparin level 0.3-0.5 units/ml Monitor platelets by anticoagulation protocol: Yes   Plan:  -Inc heparin conservatively to 550 units/hr with bloody urine -1200 HL  Narda Bonds 06/06/2015,4:16 AM

## 2015-06-06 NOTE — Progress Notes (Signed)
ANTICOAGULATION CONSULT NOTE - Follow Up Consult  Pharmacy Consult for heparin Indication: aflutter  Allergies  Allergen Reactions  . Erythromycin Ethylsuccinate Hives  . Keflex [Cephalexin] Hives  . Penicillins   . Sulfa Antibiotics     Patient Measurements: Height: 5\' 2"  (157.5 cm) Weight: 140 lb 3.4 oz (63.6 kg) IBW/kg (Calculated) : 50.1 Heparin Dosing Weight: 65 kg  Vital Signs: Temp: 99.9 F (37.7 C) (02/19 1936) Temp Source: Core (Comment) (02/19 1600) BP: 120/57 mmHg (02/19 1830) Pulse Rate: 89 (02/19 1936)  Labs:  Recent Labs  06/05/15 0234  06/05/15 1524  06/05/15 2140 06/06/15 0240 06/06/15 1215 06/06/15 1940  HGB 11.4*  --  11.1*  --   --  10.3*  --   --   HCT 35.5*  --  35.5*  --   --  33.3*  --   --   PLT 156  --  198  --   --  234  --   --   HEPARINUNFRC 0.40  < >  --   < >  --  0.18* 0.20* 0.23*  CREATININE 0.45  --   --   --  0.46 0.45  --   --   < > = values in this interval not displayed.  Estimated Creatinine Clearance: 63.9 mL/min (by C-G formula based on Cr of 0.45).   Assessment: 60 yo f admitted on 2/11 with respiratory distress.  Pharmacy is consulted to dose heparin infusion for aflutter and CCA thrombus. 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    Pt has been having hematuria x 2 days now, was having bloody secretions but have resolved per RN.  Heparin Level low at 0.23.  Will increase just slightly.  Per Dr. Elsworth Soho, aim toward lower end of goal. CBC has been stable despite hematuria (hgb 11.1>10.3, plts 234). Per RN pt stable with no new bleeding and continued hematuria  Goal of Therapy:  Heparin level 0.3-0.5 units/ml Monitor platelets by anticoagulation protocol: Yes   Plan:  - Slight increase in heparin infusion to 650 units/hr - 6/hr HL @ 0500 - Daily HL, CBC - Monitor for worsening of bleeding  Thank you for allowing [place] to participate in this patients care. Jens Som, PharmD Pager: [phone] 06/06/2015  9:16 PM

## 2015-06-06 NOTE — Progress Notes (Signed)
OT Cancellation Note and discharge  Patient Details Name: [name] [name] MRN: [id number] DOB: 1955/09/01   Cancelled Treatment:    Reason Eval/Treat Not Completed: Patient not medically ready. Called and spoke to RN this AM and she was to check with MD about what they wanted OT for since pt is intubated and not following commands. Nurse did call me back and stated that MD said what we would normally do with a pt like this and for family ed. Per RN we are still not able to try and get pt even to EOB, they are doing ROM for pt and proper positioning of flaccid side. I explained to her that all we would teach family at this point is what they are doing with pt (ROM--with not take shoulder past 90 degrees due to possible subluxation and positioning). RN stated she could go over this with family and if they had any further questions that she could not address she would let us know. I told her that if I had not heard from her by the 3:00 that I would assume all went well with family and that OT would sign off until we can actually work on getting pt up at least to EOB if not also work on transfers. Acute OT will sign off and await new orders as pt is medically stable to mobilize. I would recommend PT to be ordered at same time.   Golden Circle, OTR/L [id number] 06/06/2015    Almon Register 06/06/2015, 4:27 PM

## 2015-06-06 NOTE — Progress Notes (Signed)
Pt placed back on wean mode after research medication finished.

## 2015-06-06 NOTE — Progress Notes (Signed)
STROKE TEAM PROGRESS NOTE   History at time of admission [name] [name] is an 60 y.o. female patient who has been sick with influenza symptoms for the past 4-5 Days with worsening generalized weakness ,. Poor by mouth intake, Per her husband. Today around 8:30 PM when she got up to go to bathroom she had give severe gait instability and couldn't stand up due to generalized weakness. She'll brought into the ER by EMS for further evaluation. Was diagnosed with flu yesterday and started on Tamiflu. History of multiple autoimmune conditions with Sjgren's, rheumatoid arthritis, on plaquenil and prednisone at home.  Patient with severe restrictive distress when she presented to the ER with oxygen saturation in 70s while on nonrebreathing mask. With 100% BiPAP, her O2 sats improved to 91%, Continues to be in severe distress.    Date last known well: Generalized weakness for 4-5 days, Last normal prior to that Time last known well: unknown tPA Given: No: Clinically not suggestive of acute stroke. Last normal greater than 4-5 days ago.     SUBJECTIVE (INTERVAL HISTORY) Family at bedside. She is following more commands such as squeezing hands wiggling toes on the left. Still very sedated and sleepy.    OBJECTIVE Temp:  [99.3 F (37.4 C)-101.1 F (38.4 C)] 101.1 F (38.4 C) (02/19 1300) Pulse Rate:  [32-262] 99 (02/19 1300) Cardiac Rhythm:  [-] Sinus tachycardia (02/19 1200) Resp:  [17-33] 24 (02/19 1300) BP: (97-137)/(46-77) 116/54 mmHg (02/19 1300) SpO2:  [95 %-99 %] 98 % (02/19 1300) FiO2 (%):  [40 %] 40 % (02/19 1225) Weight:  [63.6 kg (140 lb 3.4 oz)] 63.6 kg (140 lb 3.4 oz) (02/19 0448)  CBC:  Recent Labs Lab 06/01/15 1140  06/05/15 1524 06/06/15 0240  WBC 40.1*  < > 21.9* 24.8*  NEUTROABS 35.3*  --   --   --   HGB 11.0*  < > 11.1* 10.3*  HCT 32.9*  < > 35.5* 33.3*  MCV 83.5  < > 87.4 88.8  PLT 121*  < > 198 234  < > = values in this interval not  displayed.  Basic Metabolic Panel:   Recent Labs Lab 06/03/15 1830  06/05/15 0234 06/05/15 2140 06/06/15 0240  NA 136  < > 138 137 137  K 3.2*  < > 4.4 4.0 4.0  CL 103  < > 105 104 105  CO2 26  < > 25 25 26   GLUCOSE 175*  < > 175* 143* 132*  BUN 22*  < > 21* 22* 23*  CREATININE 0.59  < > 0.45 0.46 0.45  CALCIUM 8.1*  < > 8.4* 8.4* 8.3*  MG 1.8  --  1.9 1.9  --   PHOS 3.5  --  3.0  --   --   < > = values in this interval not displayed.  Lipid Panel:     Component Value Date/Time   CHOL 95 06/03/2015 1830   TRIG 72 06/03/2015 1830   HDL 16* 06/03/2015 1830   CHOLHDL 5.9 06/03/2015 1830   VLDL 14 06/03/2015 1830   LDLCALC 65 06/03/2015 1830   HgbA1c:  Lab Results  Component Value Date   HGBA1C 6.6* 06/04/2015   Urine Drug Screen:     Component Value Date/Time   LABOPIA POSITIVE* 05/30/2015 0558   COCAINSCRNUR NONE DETECTED 05/30/2015 0558   LABBENZ NONE DETECTED 05/30/2015 0558   AMPHETMU NONE DETECTED 05/30/2015 0558   THCU NONE DETECTED 05/30/2015 0558   LABBARB NONE DETECTED 05/30/2015 0558  IMAGING I have personally reviewed the radiological images below and agree with the radiology interpretations.  Mr Brain Wo Contrast 06/03/2015    Acute small LEFT frontal lobe (precentral gyrus, MCA territory) infarct. Mild chronic small vessel ischemic disease.    2D Echocardiogram  - Left ventricle: The cavity size was normal. Systolic function wasnormal. The estimated ejection fraction was in the range of 60%to 65%. Wall motion was normal; there were no regional wallmotion abnormalities. - Mitral valve: There was mild regurgitation. - Tricuspid valve: There was mild regurgitation. - Pulmonic valve: Poorly visualized. - Pulmonary arteries: PA peak pressure: 35 mm Hg (S). Impressions:  - There was no evidence of a vegetation.   CTA head and neck -  1. 7 mm filling defect in the distal left common carotid artery consistent with a small focus of  thrombus. 2. Otherwise unremarkable appearance of the cervical carotid and vertebral arteries. 3. No major intracranial arterial branch occlusion or significant stenosis. Slight attenuation of distal left MCA branch vessels in the region of the left frontal infarct. 4. Extensive right lung consolidation, incompletely visualized. Small right pleural effusion.  TEE - Could not perform due to esophageal strictures.   Physical exam  Temp:  [99.3 F (37.4 C)-101.1 F (38.4 C)] 101.1 F (38.4 C) (02/19 1300) Pulse Rate:  [32-262] 99 (02/19 1300) Resp:  [17-33] 24 (02/19 1300) BP: (97-137)/(46-77) 116/54 mmHg (02/19 1300) SpO2:  [95 %-99 %] 98 % (02/19 1300) FiO2 (%):  [40 %] 40 % (02/19 1225) Weight:  [63.6 kg (140 lb 3.4 oz)] 63.6 kg (140 lb 3.4 oz) (02/19 0448)  General - Well nourished, well developed, intubated on sedation.  Ophthalmologic - Fundi not visualized due to positioning and ET tube.  Cardiovascular - irregular  Neuro exam stable today  Neuro - intubated on sedation, squeezes my fingers on command today left hand and wiggles toes on the left to command. Attempted fundoscopic exam could not visualize due to sedation. Opens eyes briefly on deep pain stimulation.Marland Kitchen PERRL, EOMI, positive corneal and gag, conjugate gaze. Breathing over the vent. On pain stimulation, withdraws on the left, flaccid on the right, LUE increased muscle tone. Toes equiv. Sensation, coordination or gait not tested.    ASSESSMENT/PLAN [name] [name] is a 60 y.o. female with history of Sjogren's disease, seizure, hypothyroidism and dysphagia, diagnosed with the flu 4 days prior to arrival, presenting with weakness and altered mental status. Code stroke called on arrival due to right facial droop and flaccid paralysis R arm. In the ED she was found to have hypooxic respiratory failure. LP negative for CNS infection. MRI showed left MCA embolic stroke.  Stroke:   left MCA infarct, embolic secondary to  unclear source. Endocarditis vs. Aflutter/afib vs. hyercoagulable state due to severe infection. Need to rule out antiphospholipid syndrome in the future.  Resultant  R hemiplegia  MRI  small L frontal lobe MCA infart  CTA head and neck - left CCA thrombus, no mycotic aneurysm.  2D Echo  No SOE, vegetation  TEE to be scheduled to eval for endocarditis - Could perform due to esophageal strictures  EEG spike activity from the left frontal lobe, likely associated with current stroke  LDL 65  HgbA1c 6.6  Hypercoagulable work up pending  Heparin IV for VTE prophylaxis Diet NPO time specified  No antithrombotic prior to admission, now on Heparin drip. Due to CCA thrombus, aflutter and no mycotic aneurysm seen, start on heparin drip low intensity for anticoagulation.  Ongoing aggressive stroke risk factor management  Therapy recommendations:  pending   Disposition:  pending   Respiratory Failure  Shock  Refractory hypoxia  Pneumonia / FLU  Intubated on admission in ED  CTA neck showed extensive right LL consolidation  Flu diagnosis 4 days prior to admission, started on Tamiflu at that time  On rocephin and tamiflu  WBC trending down - 24.8 on 06/06/2015   Atrial Flutter  EKG shows atrial flutter with variable A-V block 2/15 around 8p  Start heparin drip for anticoagulation in the setting of left CCA thrombus.  SVT on admission   As per husband, pt did have once heart palpitation at home before admission  ? Endocarditis  Will recommend TEE ASAP to rule out endocarditis -  Could perform due to esophageal strictures  No mycotic aneurysm seen on CTA head  Blood cultures 2/12 Group a strep pan sensitive, 2/14 neg x 48h, 2/15 ng x 24h  On heparin drip as no mytoctic aneurysm, and blood cultures more than 48 hours no growth  Sjogren syndrome  On plaquenil and steroids at home, currently off  Hypercoagulable and autoimmune work up to rule out  antiphospholipid syndrome.  LUE increased tone  Unclear etiology  EEG no seizure seen but left frontal spike activity - not fit to the picture  Need repeat EEG Monday - ordered  Hypotension - Hx hypertension  Stable - SBP approx 100  Hyperlipidemia  Home meds:  No statin  Now on lipitor 40 mg daily  LDL 65  LDL goal < 70  Continue statin at discharge  Other Stroke Risk Factors  Advanced age  Other Active Problems  Hypothyroidism  Hx Dysphagia prior to admission  AKI and hyponatremia resolved  ? Esophageal stricture secondary to Sjogren's  Raynaud's   Scoliosis  Fever - [facility] day # [place] PA-C [facility] Pager [phone] 06/06/2015, 1:29 PM   Personally examined patient and images, and have participated in and made any corrections needed to history, physical, neuro exam,assessment and plan as stated above.  I have personally obtained the history, evaluated lab date, reviewed imaging studies and agree with radiology interpretations.   [name] [name] is a 60 y/o woman with a history of Sjogren's disease, hypothyroidism and dysphagia. She was diagnosed with the flu (A) and started on tamiflu.  This patient is critically ill due to left MCA stroke, dense PNA, group A strep bacteremia, acute resp failure s/p intubation,  and left CCA thrombus from unknown embolic source and at significant risk of neurological worsening, death form recurrent stroke, hemorrhagic transformation, cardiac failure, sepsis, DIC. This patient's care requires constant monitoring of vital signs, hemodynamics, respiratory and cardiac monitoring, review of multiple databases, neurological assessment, discussion with family, other specialists and medical decision making of high complexity. I spent 30 minutes of neurocritical care time in the care of this patient.    Sarina Ill, MD Stroke Neurology [phone] [facility]       To  contact Stroke Continuity provider, please refer to [email or web address] After hours, contact General Neurology

## 2015-06-06 NOTE — Progress Notes (Signed)
ANTICOAGULATION CONSULT NOTE - Follow Up Consult  Pharmacy Consult for heparin Indication: aflutter  Allergies  Allergen Reactions  . Erythromycin Ethylsuccinate Hives  . Keflex [Cephalexin] Hives  . Penicillins   . Sulfa Antibiotics     Patient Measurements: Height: 5\' 2"  (157.5 cm) Weight: 140 lb 3.4 oz (63.6 kg) IBW/kg (Calculated) : 50.1 Heparin Dosing Weight: 65 kg  Vital Signs: Temp: 101.1 F (38.4 C) (02/19 1300) Temp Source: Core (Comment) (02/19 0800) BP: 116/54 mmHg (02/19 1300) Pulse Rate: 99 (02/19 1300)  Labs:  Recent Labs  06/05/15 0234  06/05/15 1524 06/05/15 1933 06/05/15 2140 06/06/15 0240 06/06/15 1215  HGB 11.4*  --  11.1*  --   --  10.3*  --   HCT 35.5*  --  35.5*  --   --  33.3*  --   PLT 156  --  198  --   --  234  --   HEPARINUNFRC 0.40  < >  --  0.35  --  0.18* 0.20*  CREATININE 0.45  --   --   --  0.46 0.45  --   < > = values in this interval not displayed.  Estimated Creatinine Clearance: 63.9 mL/min (by C-G formula based on Cr of 0.45).   Assessment: 60 yo f admitted on 2/11 with respiratory distress.  Pharmacy is consulted to dose heparin infusion for aflutter and CCA thrombus. 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    Pt has been having hematuria x 2 days now, was having bloody secretions but have resolved per RN.  HL this afternoon low at 0.20.  Will increase just slightly.  Per Dr. Elsworth Soho, aim toward lower end of goal. CBC has been stable despite hematuria (hgb 11.1>10.3, plts 234).   Goal of Therapy:  Heparin level 0.3-0.5 units/ml Monitor platelets by anticoagulation protocol: Yes   Plan:  - Slight increase in heparin infusion to 600 units/hr - 6/hr HL @ 2000 - Daily HL, CBC - Monitor for worsening of bleeding  Robertta Halfhill L. Nicole Kindred, PharmD PGY2 Infectious Diseases Pharmacy Resident Pager: [phone] 06/06/2015 1:35 PM

## 2015-06-06 NOTE — Progress Notes (Signed)
Nebulized research treatment performed with neb start time at 2200 and ending at 2207 with 48mL left in canister. During treatment patient tidal volume was increased to 400 (patient's 8cc), rate of 12 and I time of 1.75 for I:E ratio of 1:1. Bias flow is unknown. Filters changed at 2210. Patient tolerated well with no interruptions in medication delivery. Patient returned to previous ventilator settings at 2210.

## 2015-06-07 ENCOUNTER — Inpatient Hospital Stay (HOSPITAL_COMMUNITY): Payer: BC Managed Care – PPO

## 2015-06-07 DIAGNOSIS — J9600 Acute respiratory failure, unspecified whether with hypoxia or hypercapnia: Secondary | ICD-10-CM | POA: Diagnosis present

## 2015-06-07 LAB — BASIC METABOLIC PANEL
Anion gap: 7 (ref 5–15)
BUN: 25 mg/dL — ABNORMAL HIGH (ref 6–20)
CHLORIDE: 104 mmol/L (ref 101–111)
CO2: 27 mmol/L (ref 22–32)
CREATININE: 0.52 mg/dL (ref 0.44–1.00)
Calcium: 8.2 mg/dL — ABNORMAL LOW (ref 8.9–10.3)
GFR calc non Af Amer: >60 mL/min (ref >60)
Glucose, Bld: 195 mg/dL — ABNORMAL HIGH (ref 65–99)
Potassium: 4 mmol/L (ref 3.5–5.1)
Sodium: 138 mmol/L (ref 135–145)

## 2015-06-07 LAB — CULTURE, BLOOD (ROUTINE X 2)
CULTURE: NO GROWTH
Culture: NO GROWTH

## 2015-06-07 LAB — HEPATIC FUNCTION PANEL
ALT: 108 U/L — ABNORMAL HIGH (ref 14–54)
AST: 149 U/L — ABNORMAL HIGH (ref 15–41)
Albumin: 1.7 g/dL — ABNORMAL LOW (ref 3.5–5.0)
Alkaline Phosphatase: 540 U/L — ABNORMAL HIGH (ref 38–126)
Bilirubin, Direct: 0.2 mg/dL (ref 0.1–0.5)
Indirect Bilirubin: 0.1 mg/dL — ABNORMAL LOW (ref 0.3–0.9)
Total Bilirubin: 0.3 mg/dL (ref 0.3–1.2)
Total Protein: 6.5 g/dL (ref 6.5–8.1)

## 2015-06-07 LAB — CBC
HCT: 31.7 % — ABNORMAL LOW (ref 36.0–46.0)
HEMOGLOBIN: 9.7 g/dL — AB (ref 12.0–15.0)
MCH: 27.8 pg (ref 26.0–34.0)
MCHC: 30.6 g/dL (ref 30.0–36.0)
MCV: 90.8 fL (ref 78.0–100.0)
PLATELETS: 300 10*3/uL (ref 150–400)
RBC: 3.49 MIL/uL — AB (ref 3.87–5.11)
RDW: 17.8 % — AB (ref 11.5–15.5)
WBC: 31.7 10*3/uL — ABNORMAL HIGH (ref 4.0–10.5)

## 2015-06-07 LAB — GLUCOSE, CAPILLARY
GLUCOSE-CAPILLARY: 116 mg/dL — AB (ref 65–99)
Glucose-Capillary: 120 mg/dL — ABNORMAL HIGH (ref 65–99)
Glucose-Capillary: 133 mg/dL — ABNORMAL HIGH (ref 65–99)
Glucose-Capillary: 164 mg/dL — ABNORMAL HIGH (ref 65–99)
Glucose-Capillary: 178 mg/dL — ABNORMAL HIGH (ref 65–99)
Glucose-Capillary: 94 mg/dL (ref 65–99)

## 2015-06-07 LAB — HEPARIN LEVEL (UNFRACTIONATED)
HEPARIN UNFRACTIONATED: 0.24 [IU]/mL — AB (ref 0.30–0.70)
Heparin Unfractionated: 0.26 IU/mL — ABNORMAL LOW (ref 0.30–0.70)
Heparin Unfractionated: 0.25 [IU]/mL — ABNORMAL LOW (ref 0.30–0.70)

## 2015-06-07 LAB — ANCA TITERS

## 2015-06-07 LAB — HOMOCYSTEINE: Homocysteine: 5 umol/L (ref 0.0–15.0)

## 2015-06-07 NOTE — Progress Notes (Signed)
ANTICOAGULATION CONSULT NOTE - Follow Up Consult  Pharmacy Consult for Heparin  Indication: A-flutter, stroke  Allergies  Allergen Reactions  . Erythromycin Ethylsuccinate Hives  . Keflex [Cephalexin] Hives  . Penicillins   . Sulfa Antibiotics    Patient Measurements: Height: 5\' 2"  (157.5 cm) Weight: 141 lb 5 oz (64.1 kg) IBW/kg (Calculated) : 50.1  Vital Signs: Temp: 99.7 F (37.6 C) (02/20 1500) Temp Source: Core (Comment) (02/20 0400) BP: 107/60 mmHg (02/20 1500) Pulse Rate: 85 (02/20 1500)  Labs:  Recent Labs  06/05/15 1524  06/05/15 2140 06/06/15 0240  06/06/15 1940 06/07/15 0218 06/07/15 1101  HGB 11.1*  --   --  10.3*  --   --  9.7*  --   HCT 35.5*  --   --  33.3*  --   --  31.7*  --   PLT 198  --   --  234  --   --  300  --   HEPARINUNFRC  --   < >  --  0.18*  < > 0.23* 0.26* 0.24*  CREATININE  --   --  0.46 0.45  --   --  0.52  --   < > = values in this interval not displayed.  Estimated Creatinine Clearance: 64.1 mL/min (by C-G formula based on Cr of 0.52).  Assessment: Heparin level remains subtherapeutic, hematuria is improving, will keep heparin goal low.  Hgb down a big, plts wnl.  Goal of Therapy:  Heparin level 0.3-0.5 units/ml Monitor platelets by anticoagulation protocol: Yes   Plan:  -Increase heparin gtt to 800 units/hr -Daily HL, CBC -Monitor hematuria -Heparin level this evening   Harvel Quale 06/07/2015,3:37 PM

## 2015-06-07 NOTE — Care Management Note (Signed)
Case Management Note  Patient Details  Name: [name] [name] MRN: [id number] Date of Birth: 14-Feb-1956  Subjective/Objective:   Pt admitted with sepsis, positive for flu and had to be intubated             Action/Plan:   06/07/2015  Attending recommended LTAC referral due to pt being unable to wean off vent (day 8) at this time due to deconditioning and poor mental status - planned for trach 2/22.  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  CM consulted with Physician Advisor on appropriateness for LTAC - CM will continue to monitor.  06/05/15 CM received call from [name] for [facility] hired by [facility] to assist with [facility] needs at discharge, CM informed resource that pt remains on ventilator.    Pt is independent from home with husband [name], pt is a  Oncologist.  CM will continue to monitor for disposition needs   Expected Discharge Date:                  Expected Discharge Plan:  Home/Self Care  In-House Referral:     Discharge planning Services  CM Consult  Post Acute Care Choice:    Choice offered to:     DME Arranged:    DME Agency:     HH Arranged:    HH Agency:     Status of Service:  In process, will continue to follow  Medicare Important Message Given:    Date Medicare IM Given:    Medicare IM give by:    Date Additional Medicare IM Given:    Additional Medicare Important Message give by:     If discussed at [place] of Stay Meetings, dates discussed:    Additional Comments:  [name], RN 06/07/2015, 1:28 PM Case Management Note  Patient Details  Name: [name] [name] MRN: [id number] Date of Birth: Jun 16, 1955  Subjective/Objective:                    Action/Plan:   Expected Discharge Date:                  Expected Discharge Plan:  Home/Self Care  In-House Referral:     Discharge planning Services  CM Consult  Post Acute Care Choice:    Choice offered to:      DME Arranged:    DME Agency:     HH Arranged:    [place] Agency:     Status of Service:  In process, will continue to follow  Medicare Important Message Given:    Date Medicare IM Given:    Medicare IM give by:    Date Additional Medicare IM Given:    Additional Medicare Important Message give by:     If discussed at [place] of Stay Meetings, dates discussed:    Additional Comments:  [name], RN 06/07/2015, 1:28 PM

## 2015-06-07 NOTE — Research (Signed)
CRITICAL VALUE ALERT  Critical value received:  AST 147 (from labs drawn on 06/05/15)  Date of notification:  06/07/15  Time of notification:  14:05  Critical value read back:Yes  Nurse who received alert:  Doreatha Martin, RN  MD notified (1st page):  Dr. Elsworth Soho  Time of first page:  1408  MD notified (2nd page):  Time of second page:  Responding MD:  Dr. Elsworth Soho  Time MD responded:  14:10; no new orders  Also notified PI Dr. Titus Mould at 14:12, no new orders. Confirmed with Dr. Titus Mould to continue with study drug per protocol.

## 2015-06-07 NOTE — Progress Notes (Signed)
Name:[name] [name] MHD:[id number] DOB:1955/07/16   ADMISSION DATE: 05/29/2015 CONSULTATION DATE: 05/30/15  REFERRING MD: Dr. Leonides Schanz  CHIEF COMPLAINT: Hypoxic respiratory failure  HISTORY OF PRESENT ILLNESS:  [name] is a 60 y/o woman with a history of Sjogren's disease, hypothyroidism and dysphagia. She was diagnosed with the flu (A) about 4 days PTA and started on tamiflu. 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     OVERNIGHT EVENTS -  RASS 0 Even balance Slow improvement.    BP 124/66 mmHg  Pulse 83  Temp(Src) 98.8 F (37.1 C) (Core (Comment))  Resp 21  Ht 5' 2"  (1.575 m)  Wt 141 lb 5 oz (64.1 kg)  BMI 25.84 kg/m2  SpO2 100%     Recent Labs Lab 06/05/15 2140 06/06/15 0240 06/07/15 0218  NA 137 137 138  K 4.0 4.0 4.0  CL 104 105 104  CO2 25 26 27   BUN 22* 23* 25*  CREATININE 0.46 0.45 0.52  GLUCOSE 143* 132* 195*    Recent Labs Lab 06/05/15 1524 06/06/15 0240 06/07/15 0218  HGB 11.1* 10.3* 9.7*  HCT 35.5* 33.3* 31.7*  WBC 21.9* 24.8* 31.7*  PLT 198 234 300   CBG (last 3)   Recent Labs  06/06/15 1945 06/07/15 0006 06/07/15 0406  GLUCAP 120* 164* 178*    ABG    Component Value Date/Time   PHART 7.510* 06/04/2015 1233   PCO2ART 34.1* 06/04/2015 1233   PO2ART 76.5* 06/04/2015 1233   HCO3 26.9* 06/04/2015 1233   TCO2 27.9 06/04/2015 1233   ACIDBASEDEF 2.0 06/04/2015 0956   O2SAT 96.5 06/04/2015 1233    Imaging Dg Chest Port 1 View  06/07/2015  CLINICAL DATA:  Acute respiratory failure. EXAM: PORTABLE CHEST 1 VIEW COMPARISON:  06/06/2015. FINDINGS: Normal sized heart. No significant change in right lung airspace opacity. The clear left lung. Endotracheal tube in satisfactory position. Feeding tube extending into the stomach. Unremarkable bones. IMPRESSION: Stable right lung pneumonia. Electronically Signed   By:  Claudie Revering M.D.   On: 06/07/2015 07:12   Dg Chest Port 1 View  06/06/2015  CLINICAL DATA:  Acute respiratory failure. Followup exam for pneumonia. EXAM: PORTABLE CHEST 1 VIEW COMPARISON:  06/04/2015 FINDINGS: Airspace consolidation in the right lung has mildly improved. There are no new areas of lung opacity. Endotracheal tube and enteric tube are stable in well positioned. IMPRESSION: 1. Mild improvement in the right lung pneumonia. 2. No new abnormalities. No other change. Support apparatus remains well-positioned. Electronically Signed   By: Lajean Manes M.D.   On: 06/06/2015 07:53   STUDIES:  Head CT 2/12 >> no acute hemorrhagic infarct.  Chest CT 2/12 >> ground glass opacities, consolidation c/w pneumonia.  CXR 2/14 / 2/15 >> little change. R lung consolidation, ? L infiltrate.  CXR 2/16 >> improving consolidation on the right MRI Head 2/15 > L Frontal precentral gyrus CVA CTA Head / Neck 2/16 > L CC thrombus CXR 2/20 > R pneumonia with slow improvement.   CULTURES: Blood 2/12>>>Group A Strep pan sensitive.  Blood Cx 2/15 >> NG x 4 days Blood Cx 2/14 >> NG x 5 days Urine 2/12>> negative.  CSF >> Negative  Resp 2/14 > No organisms on gram stain, culture pending  ANTIBIOTICS: Vanc 2/12>>> 2/15 aztreonam  2/12>>> 2/14 Acyclovir>>>2/12  levaquin 2/12->> 2/15 Oseltamavir 2/13 >> Stop date 2/20 MEJII 2/15 >> Ceftriaxone 2/15 >>  SIGNIFICANT  EVENTS: Intubated in ED Shock Refractory hypoxia 2/13 > weaning Vent overnight.  2/15 > CVA on MRI 2/16 > Left CC thrombus, started on Heparin.   LINES/TUBES: Right subclavian CVC 2/12>> 2/15 Foley 2/12>> ETT 2/12>> A line 2/12>>>2/14   Physical Exam:  General: acutely ill, pERRLA Neuro:  intubated, flicker movement Lue/LLE , awake, follows commands HEENT:jvd, ETT, NG tube, cough +, gag + PULM: clear,Improving aeration.  CV: s1 s2 RRT, No MGR GI: soft, bs wnl, nondistended.  Extremities: no edema, boots in place.     DISCUSSION: 60 y/o with influenza A and right sided pneumonia -strep pyogenes , in significant respiratory distress, intubated for hypoxic respiratory failure found to have L Frontal CVA and LCC thrombus.   ASSESSMENT / PLAN:  PULMONARY A: Pneumonia (necrotizing?) Dense infiltrate- continued daily improvements resp alk mild P:  SBTs but no extubation -very deconditioned & poor  MS Considering Trach on 2/22 Goal fluid balance even.  ABG's as needed. CXR as needed.   CARDIOVASCULAR A:  Septic shock - resolved SVT (history supports fib at home) adequate cortisol response H./o afib? > a flutter with RVR resolved.  Distal CC thrombus TTE > mildly elevated PA pressure, otherwise mostly normal > unable to do TEE  P:  Lopressor 12.5 bid & prn with parameters. Lasix to neg / even May need amio if irregular rhythm returns Heparin gtt -watch hematuria Planning to transition to plavix / asa after Trach.  No further Aflutter / fib at this point.   RENAL A:  AKI. Hyponatremia - resolved NONAG met acidosis- saline   Recent Labs Lab 06/05/15 2140 06/06/15 0240 06/07/15 0218  NA 137 137 138    P:  Lasix 20 daily  k goal greater 4 with svt prior supp k as needed.   GASTROINTESTINAL A:  Question of esophageal stricture, sjorgens P:  Cortrak -Tube feeds to goal Protonix.  LFT normal.    INFECTIOUS A:  Pneumonia (r/o different organism than group A strep ) Doubt infectious clot Flu A P:  tamiflu x 7 days STOP date 2/20 Ceftriaxone ct length per ID Continued neb Aminoglycoside study drug ID on board    TEE unable- plan for ceftx x 6 wks total - will need PICC placed when she is trached.   Endocrine: A:  Hypothyroidism, c/w sick euthryoid Hyperglycemia P:  Ct synthroid  repeat T3 T4 in 2 months Moderate SSI Lantus 10 u qd. For now.   NEUROLOGIC A:  Severe hypoxia requiring paralysis On presentation weakness CVA on MRI Brain c/w  right sided deficits.  Left CC A thrombus identified on CTA (doubt infectious) EEG spike activity from left frontal lobe > cva area, not seziure P:  RASS goal:0  RASS met.  Heparin gtt for now.  Statin when able.   Improving neurological prognosis but expect severe debilitation  ASA/ Plavix per neuro after trach.    FAMILY  - Updates: husband daily  - Inter-disciplinary family meet or Palliative Care meeting due by: day 7        Summary - Gr A strep pna complicated by left CVA & left carotid thrombus ? Embolic, doubt endocarditis but TEE unable, wean as able, but so far unable to extubate due to  profound weakness. Continue slow improvement.   Paula Compton, MD Resident - PGY 2

## 2015-06-07 NOTE — Progress Notes (Signed)
EEG completed; results pending.    

## 2015-06-07 NOTE — Progress Notes (Signed)
Truddie Coco, RRT Respiratory Therapist Signed Respiratory Therapy Progress Notes 06/07/2015 10:30 AM    Expand All Collapse All   Nebulized research treatment performed with neb start time at 2155 and ending at 2201 with 25mL left in canister. During treatment patient tidal volume was increased to 440 (patient's 8cc), rate of 12 and I time of 2.60for I:E ratio of 1:1. Bias flow is unknown. Filters changed at 2202. Patient tolerated well with current vitals of HR: 83, RR: 18 SATS: 99%. Patient returned to previous ventilator settings at 2201

## 2015-06-07 NOTE — Progress Notes (Signed)
ANTICOAGULATION CONSULT NOTE - Follow Up Consult  Pharmacy Consult for Heparin  Indication: A-flutter, stroke  Allergies  Allergen Reactions  . Erythromycin Ethylsuccinate Hives  . Keflex [Cephalexin] Hives  . Penicillins   . Sulfa Antibiotics    Patient Measurements: Height: 5\' 2"  (157.5 cm) Weight: 140 lb 3.4 oz (63.6 kg) IBW/kg (Calculated) : 50.1  Vital Signs: Temp: 99 F (37.2 C) (02/20 0200) Temp Source: Core (Comment) (02/20 0000) BP: 121/63 mmHg (02/20 0200) Pulse Rate: 83 (02/20 0200)  Labs:  Recent Labs  06/05/15 0234  06/05/15 1524  06/05/15 2140 06/06/15 0240 06/06/15 1215 06/06/15 1940 06/07/15 0218  HGB 11.4*  --  11.1*  --   --  10.3*  --   --  9.7*  HCT 35.5*  --  35.5*  --   --  33.3*  --   --  31.7*  PLT 156  --  198  --   --  234  --   --  PENDING  HEPARINUNFRC 0.40  < >  --   < >  --  0.18* 0.20* 0.23* 0.26*  CREATININE 0.45  --   --   --  0.46 0.45  --   --   --   < > = values in this interval not displayed.  Estimated Creatinine Clearance: 63.9 mL/min (by C-G formula based on Cr of 0.45).  Assessment: Heparin level sub-therapeutic this AM, Hgb 10.3>>9.7, pt is having some blood in urine (MD aware)  Goal of Therapy:  Heparin level 0.3-0.5 units/ml Monitor platelets by anticoagulation protocol: Yes   Plan:  -Inc heparin conservatively to 700 units/hr with bloody urine -1100 HL  Narda Bonds 06/07/2015,3:22 AM

## 2015-06-07 NOTE — Progress Notes (Signed)
ANTICOAGULATION CONSULT NOTE - Follow Up Consult  Pharmacy Consult for Heparin  Indication: A-flutter, stroke  Allergies  Allergen Reactions  . Erythromycin Ethylsuccinate Hives  . Keflex [Cephalexin] Hives  . Penicillins   . Sulfa Antibiotics    Patient Measurements: Height: 5\' 2"  (157.5 cm) Weight: 141 lb 5 oz (64.1 kg) IBW/kg (Calculated) : 50.1  Vital Signs: Temp: 99.7 F (37.6 C) (02/20 2000) Temp Source: Core (Comment) (02/20 2000) BP: 118/55 mmHg (02/20 2000) Pulse Rate: 89 (02/20 2000)  Labs:  Recent Labs  06/05/15 1524  06/05/15 2140 06/06/15 0240  06/07/15 0218 06/07/15 1101 06/07/15 2038  HGB 11.1*  --   --  10.3*  --  9.7*  --   --   HCT 35.5*  --   --  33.3*  --  31.7*  --   --   PLT 198  --   --  234  --  300  --   --   HEPARINUNFRC  --   < >  --  0.18*  < > 0.26* 0.24* 0.25*  CREATININE  --   --  0.46 0.45  --  0.52  --   --   < > = values in this interval not displayed.  Estimated Creatinine Clearance: 64.1 mL/min (by C-G formula based on Cr of 0.52).  Assessment: Heparin level remains subtherapeutic at 0.25 after rate increase. Hematuria is improving, will keep heparin goal low. Hgb slight trend down to 9.7, plts wnl.  Goal of Therapy:  Heparin level 0.3-0.5 units/ml Monitor platelets by anticoagulation protocol: Yes   Plan:  Increase heparin gtt to 950 units/hr Check 6 hr HL Monitor daily HL, CBC, s/s of bleed  Elenor Quinones, PharmD, [facility] Clinical Pharmacist Pager [phone] 06/07/2015 9:07 PM

## 2015-06-07 NOTE — Procedures (Signed)
HPI:  60 y/o with MCA infarct and encephalopathy.  Prior EEG on 2/15 with L frontocentral sharp waves and moderate slowing.  TECHNICAL SUMMARY:  A multichannel referential and bipolar montage EEG using the standard international 10-20 system was performed.  The dominant background activity consists of 9 hertz activity seen most prominantly over the posterior head region.    Low voltage fast (beta) activity is distributed symmetrically and maximally over the anterior head regions.  ACTIVATION:  Photic stimulation and hyperventilation were not performed  EPILEPTIFORM ACTIVITY:  There were no spikes, sharp waves or paroxysmal activity.  SLEEP:  Physiologic drowsiness and stage II sleep were noted.  CARDIAC:  The EKG lead revealed a regular sinus rhythm.  IMPRESSION:  This is a marked improvement in the patient's EEG compared to prior EEGs, and is within ranges of normal limits.  There were no left frontocentral spikes on this EEG, but this does not exclude the diagnosis of a seizure disorder.  There were no focal or lateralizing features on this examination.

## 2015-06-07 NOTE — Progress Notes (Signed)
STROKE TEAM PROGRESS NOTE   History at time of admission Rowyn Norquist is an 60 y.o. female patient who has been sick with influenza symptoms for the past 4-5 Days with worsening generalized weakness ,. Poor by mouth intake, Per her husband. Today around 8:30 PM when she got up to go to bathroom she had give severe gait instability and couldn't stand up due to generalized weakness. She'll brought into the ER by EMS for further evaluation. Was diagnosed with flu yesterday and started on Tamiflu. History of multiple autoimmune conditions with Sjgren's, rheumatoid arthritis, on plaquenil and prednisone at home.  Patient with severe restrictive distress when she presented to the ER with oxygen saturation in 70s while on nonrebreathing mask. With 100% BiPAP, her O2 sats improved to 91%, Continues to be in severe distress.    Date last known well: Generalized weakness for 4-5 days, Last normal prior to that Time last known well: unknown tPA Given: No: Clinically not suggestive of acute stroke. Last normal greater than 4-5 days ago.     SUBJECTIVE (INTERVAL HISTORY) No family at bedside. She is following  commands such as squeezing hands wiggling toes on the left. Still very   sleepy. . Repeat EEG shows almost normalization with no sharp waves or ongoing seizure activity   OBJECTIVE Temp:  [98.6 F (37 C)-99.9 F (37.7 C)] 99.1 F (37.3 C) (02/20 1700) Pulse Rate:  [72-100] 86 (02/20 1700) Cardiac Rhythm:  [-] Normal sinus rhythm (02/20 1600) Resp:  [15-33] 20 (02/20 1700) BP: (99-126)/(51-68) 111/59 mmHg (02/20 1700) SpO2:  [97 %-100 %] 99 % (02/20 1700) FiO2 (%):  [40 %] 40 % (02/20 1637) Weight:  [141 lb 5 oz (64.1 kg)] 141 lb 5 oz (64.1 kg) (02/20 0500)  CBC:  Recent Labs Lab 06/01/15 1140  06/06/15 0240 06/07/15 0218  WBC 40.1*  < > 24.8* 31.7*  NEUTROABS 35.3*  --   --   --   HGB 11.0*  < > 10.3* 9.7*  HCT 32.9*  < > 33.3* 31.7*  MCV 83.5  < > 88.8 90.8   PLT 121*  < > 234 300  < > = values in this interval not displayed.  Basic Metabolic Panel:   Recent Labs Lab 06/03/15 1830  06/05/15 0234 06/05/15 2140 06/06/15 0240 06/07/15 0218  NA 136  < > 138 137 137 138  K 3.2*  < > 4.4 4.0 4.0 4.0  CL 103  < > 105 104 105 104  CO2 26  < > 25 25 26 27   GLUCOSE 175*  < > 175* 143* 132* 195*  BUN 22*  < > 21* 22* 23* 25*  CREATININE 0.59  < > 0.45 0.46 0.45 0.52  CALCIUM 8.1*  < > 8.4* 8.4* 8.3* 8.2*  MG 1.8  --  1.9 1.9  --   --   PHOS 3.5  --  3.0  --   --   --   < > = values in this interval not displayed.  Lipid Panel:     Component Value Date/Time   CHOL 95 06/03/2015 1830   TRIG 72 06/03/2015 1830   HDL 16* 06/03/2015 1830   CHOLHDL 5.9 06/03/2015 1830   VLDL 14 06/03/2015 1830   LDLCALC 65 06/03/2015 1830   HgbA1c:  Lab Results  Component Value Date   HGBA1C 6.6* 06/04/2015   Urine Drug Screen:     Component Value Date/Time   LABOPIA POSITIVE* 05/30/2015 0558   COCAINSCRNUR NONE  DETECTED 05/30/2015 0558   LABBENZ NONE DETECTED 05/30/2015 0558   AMPHETMU NONE DETECTED 05/30/2015 0558   THCU NONE DETECTED 05/30/2015 0558   LABBARB NONE DETECTED 05/30/2015 0558      IMAGING I have personally reviewed the radiological images below and agree with the radiology interpretations.  Mr Brain Wo Contrast 06/03/2015    Acute small LEFT frontal lobe (precentral gyrus, MCA territory) infarct. Mild chronic small vessel ischemic disease.    2D Echocardiogram  - Left ventricle: The cavity size was normal. Systolic function wasnormal. The estimated ejection fraction was in the range of 60%to 65%. Wall motion was normal; there were no regional wallmotion abnormalities. - Mitral valve: There was mild regurgitation. - Tricuspid valve: There was mild regurgitation. - Pulmonic valve: Poorly visualized. - Pulmonary arteries: PA peak pressure: 35 mm Hg (S). Impressions:  - There was no evidence of a vegetation.   CTA head  and neck -  1. 7 mm filling defect in the distal left common carotid artery consistent with a small focus of thrombus. 2. Otherwise unremarkable appearance of the cervical carotid and vertebral arteries. 3. No major intracranial arterial branch occlusion or significant stenosis. Slight attenuation of distal left MCA branch vessels in the region of the left frontal infarct. 4. Extensive right lung consolidation, incompletely visualized. Small right pleural effusion.  TEE - Could not perform due to esophageal strictures.  EEG 06/07/15 : This is a marked improvement in the patient's EEG compared to prior EEGs, and is within ranges of normal limits. Physical exam  Temp:  [98.6 F (37 C)-99.9 F (37.7 C)] 99.1 F (37.3 C) (02/20 1700) Pulse Rate:  [72-100] 86 (02/20 1700) Resp:  [15-33] 20 (02/20 1700) BP: (99-126)/(51-68) 111/59 mmHg (02/20 1700) SpO2:  [97 %-100 %] 99 % (02/20 1700) FiO2 (%):  [40 %] 40 % (02/20 1637) Weight:  [141 lb 5 oz (64.1 kg)] 141 lb 5 oz (64.1 kg) (02/20 0500)  General - Well nourished, well developed, intubated on sedation.  Ophthalmologic - Fundi not visualized due to positioning and ET tube.  Cardiovascular - irregular  Neuro exam stable today  Neuro - intubated on sedation, squeezes my fingers on command today left hand and wiggles toes on the left to command.   fundoscopic exam could not be done. Opens eyes briefly on deep pain stimulation.Marland Kitchen PERRL, EOMI, positive corneal and gag, conjugate gaze. Breathing over the vent. On pain stimulation, withdraws on the left, flaccid on the right, LUE increased muscle tone. Toes equiv. Sensation, coordination or gait not tested.    ASSESSMENT/PLAN Ms. Marueen Pezza is a 60 y.o. female with history of Sjogren's disease, seizure, hypothyroidism and dysphagia, diagnosed with the flu 4 days prior to arrival, presenting with weakness and altered mental status. Code stroke called on arrival due to right facial droop and  flaccid paralysis R arm. In the ED she was found to have hypooxic respiratory failure. LP negative for CNS infection. MRI showed left MCA embolic stroke.  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 Endocarditis vs. Aflutter/afib vs. hyercoagulable state due to severe infection. Need to rule out antiphospholipid syndrome in the future.  Resultant  R hemiplegia  MRI  small L frontal lobe MCA infart  CTA head and neck - left CCA thrombus, no mycotic aneurysm.  2D Echo  No SOE, vegetation  TEE to be scheduled to eval for endocarditis - Could perform due to esophageal strictures  EEG spike activity from the left frontal  lobe, likely associated with current stroke  LDL 65  HgbA1c 6.6  Hypercoagulable work up pending  Heparin IV for VTE prophylaxis Diet NPO time specified  No antithrombotic prior to admission, now on Heparin drip. Due to CCA thrombus, aflutter and no mycotic aneurysm seen, start on heparin drip low intensity for anticoagulation.   Ongoing aggressive stroke risk factor management  Therapy recommendations:  pending   Disposition:  pending   Respiratory Failure  Shock  Refractory hypoxia  Pneumonia / FLU  Intubated on admission in ED  CTA neck showed extensive right LL consolidation  Flu diagnosis 4 days prior to admission, started on Tamiflu at that time  On rocephin and tamiflu  WBC trending down - 24.8 on 06/06/2015   Atrial Flutter  EKG shows atrial flutter with variable A-V block 2/15 around 8p  Start heparin drip for anticoagulation in the setting of left CCA thrombus.  SVT on admission   As per husband, pt did have once heart palpitation at home before admission  ? Endocarditis  Will recommend TEE ASAP to rule out endocarditis -  Could perform due to esophageal strictures  No mycotic aneurysm seen on CTA head  Blood cultures 2/12 Group a strep pan sensitive, 2/14 neg x 48h, 2/15 ng x 24h  On heparin drip as no  mytoctic aneurysm, and blood cultures more than 48 hours no growth  Sjogren syndrome  On plaquenil and steroids at home, currently off  Hypercoagulable and autoimmune work up to rule out antiphospholipid syndrome.  LUE increased tone  Unclear etiology  EEG no seizure seen but left frontal spike activity - not fit to the picture  Need repeat EEG Monday - ordered  Hypotension - Hx hypertension  Stable - SBP approx 100  Hyperlipidemia  Home meds:  No statin  Now on lipitor 40 mg daily  LDL 65  LDL goal < 70  Continue statin at discharge  Other Stroke Risk Factors  Advanced age  Other Active Problems  Hypothyroidism  Hx Dysphagia prior to admission  AKI and hyponatremia resolved  ? Esophageal stricture secondary to Sjogren's  Raynaud's   Scoliosis  Fever - [facility] day # 8         Personally examined patient and images, and have participated in and made any corrections needed to history, physical, neuro exam,assessment and plan as stated above.  I have personally obtained the history, evaluated lab date, reviewed imaging studies and agree with radiology interpretations.   Ms/ Ciccarello is a 60 y/o woman with a history of Sjogren's disease, hypothyroidism and dysphagia. She was diagnosed with the flu (A) and started on tamiflu.  This patient is critically ill due to left MCA stroke, dense PNA, group A strep bacteremia, acute resp failure s/p intubation,  and left CCA thrombus from unknown embolic source and at significant risk of neurological worsening, death form recurrent stroke, hemorrhagic transformation, cardiac failure, sepsis, DIC. This patient's care requires constant monitoring of vital signs, hemodynamics, respiratory and cardiac monitoring, review of multiple databases, neurological assessment, discussion with family, other specialists and medical decision making of high complexity. I spent 32 minutes of neurocritical care time in the care of this  patient.   Discussed with Dr. Elsworth Soho. Patient has esophageal stricture and hence TEE will not be possible. Patient has not had demonstrated atrial flutter (during the hospitalization hence I do not think she needs long-term anticoagulation. Patient can be transitioned to aspirin and Plavix after she gets a tracheostomy. She  was treated empirically for endocarditis with 6 weeks of antibiotics.   Antony Contras, MD Stroke Neurology [id number] [facility]       To contact Stroke Continuity provider, please refer to [email or web address] After hours, contact General Neurology

## 2015-06-07 NOTE — Progress Notes (Signed)
Nebulized research treatment performed with neb start time at 10:20 and ending at 10:26 with 37mL left in canister. During treatment patient tidal volume was increased to 440 (patient's 8cc), rate of 12 and I time of 1.75 for I:E ratio of 1:1. Bias flow is unknown. Filters changed at 10:28. Patient tolerated well with current vitals of HR: 78, RR: 20 SATS: 100%. Patient returned to previous ventilator settings at 10:27.

## 2015-06-08 ENCOUNTER — Inpatient Hospital Stay (HOSPITAL_COMMUNITY): Payer: BC Managed Care – PPO

## 2015-06-08 LAB — BASIC METABOLIC PANEL
ANION GAP: 6 (ref 5–15)
BUN: 33 mg/dL — ABNORMAL HIGH (ref 6–20)
CHLORIDE: 104 mmol/L (ref 101–111)
CO2: 27 mmol/L (ref 22–32)
Calcium: 8.2 mg/dL — ABNORMAL LOW (ref 8.9–10.3)
Creatinine, Ser: 0.78 mg/dL (ref 0.44–1.00)
GFR calc Af Amer: >60 mL/min (ref >60)
GLUCOSE: 161 mg/dL — AB (ref 65–99)
POTASSIUM: 4.2 mmol/L (ref 3.5–5.1)
SODIUM: 137 mmol/L (ref 135–145)

## 2015-06-08 LAB — CBC
HCT: 30.9 % — ABNORMAL LOW (ref 36.0–46.0)
HEMOGLOBIN: 9.3 g/dL — AB (ref 12.0–15.0)
MCH: 27.8 pg (ref 26.0–34.0)
MCHC: 30.1 g/dL (ref 30.0–36.0)
MCV: 92.5 fL (ref 78.0–100.0)
PLATELETS: 401 10*3/uL — AB (ref 150–400)
RBC: 3.34 MIL/uL — AB (ref 3.87–5.11)
RDW: 18.9 % — ABNORMAL HIGH (ref 11.5–15.5)
WBC: 27.6 10*3/uL — AB (ref 4.0–10.5)

## 2015-06-08 LAB — GLUCOSE, CAPILLARY
GLUCOSE-CAPILLARY: 163 mg/dL — AB (ref 65–99)
GLUCOSE-CAPILLARY: 130 mg/dL — AB (ref 65–99)
GLUCOSE-CAPILLARY: 103 mg/dL — AB (ref 65–99)
GLUCOSE-CAPILLARY: 126 mg/dL — AB (ref 65–99)
Glucose-Capillary: 148 mg/dL — ABNORMAL HIGH (ref 65–99)
Glucose-Capillary: 135 mg/dL — ABNORMAL HIGH (ref 65–99)

## 2015-06-08 LAB — HEPARIN LEVEL (UNFRACTIONATED)
HEPARIN UNFRACTIONATED: 0.39 [IU]/mL (ref 0.30–0.70)
HEPARIN UNFRACTIONATED: 0.36 [IU]/mL (ref 0.30–0.70)

## 2015-06-08 MED ORDER — GERHARDT'S BUTT CREAM
TOPICAL_CREAM | CUTANEOUS | Status: DC | PRN
  Administered 2015-06-08 – 2015-06-09 (×2): via TOPICAL
  Filled 2015-06-08 (×2): qty 1

## 2015-06-08 NOTE — Progress Notes (Signed)
Name:[name] [name] MRN:[id number] DOB:08/23/1955   ADMISSION DATE: 05/29/2015 CONSULTATION DATE: 05/30/15  REFERRING MD: Dr. Ward  CHIEF COMPLAINT: Hypoxic respiratory failure  HISTORY OF PRESENT ILLNESS:  [name] is a 60 y/o woman with a history of Sjogren's disease, hypothyroidism and dysphagia. She was diagnosed with the flu (A) about 4 days PTA and started on tamiflu. 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     OVERNIGHT EVENTS - Afebrile, no acute events RASS 0 Rate 12, FiO2 40, PEEP 5 Even balance More responsive, moving right leg, increased strength on left side.    BP 111/64 mmHg  Pulse 77  Temp(Src) 99.3 F (37.4 C) (Core (Comment))  Resp 16  Ht 5' 2" (1.575 m)  Wt 146 lb 13.2 oz (66.6 kg)  BMI 26.85 kg/m2  SpO2 100%     Recent Labs Lab 06/06/15 0240 06/07/15 0218 06/08/15 0359  NA 137 138 137  K 4.0 4.0 4.2  CL 105 104 104  CO2 26 27 27  BUN 23* 25* 33*  CREATININE 0.45 0.52 0.78  GLUCOSE 132* 195* 161*    Recent Labs Lab 06/06/15 0240 06/07/15 0218 06/08/15 0359  HGB 10.3* 9.7* 9.3*  HCT 33.3* 31.7* 30.9*  WBC 24.8* 31.7* 27.6*  PLT 234 300 401*   CBG (last 3)   Recent Labs  06/07/15 1521 06/07/15 1959 06/08/15 0423  GLUCAP 94 120* 163*    ABG    Component Value Date/Time   PHART 7.510* 06/04/2015 1233   PCO2ART 34.1* 06/04/2015 1233   PO2ART 76.5* 06/04/2015 1233   HCO3 26.9* 06/04/2015 1233   TCO2 27.9 06/04/2015 1233   ACIDBASEDEF 2.0 06/04/2015 0956   O2SAT 96.5 06/04/2015 1233    Imaging Dg Chest Port 1 View  06/07/2015  CLINICAL DATA:  Acute respiratory failure. EXAM: PORTABLE CHEST 1 VIEW COMPARISON:  06/06/2015. FINDINGS: Normal sized heart. No significant change in right lung airspace opacity. The clear left lung. Endotracheal tube in satisfactory position. Feeding tube extending into  the stomach. Unremarkable bones. IMPRESSION: Stable right lung pneumonia. Electronically Signed   By: [name]  [name] M.D.   On: 06/07/2015 07:12   STUDIES:  Head CT 2/12 >> no acute hemorrhagic infarct.  Chest CT 2/12 >> ground glass opacities, consolidation c/w pneumonia.  CXR 2/14 / 2/15 >> little change. R lung consolidation, ? L infiltrate.  CXR 2/16 >> improving consolidation on the right MRI Head 2/15 > L Frontal precentral gyrus CVA CTA Head / Neck 2/16 > L CC thrombus CXR 2/20 > R pneumonia with slow improvement.  EEG 2/20 > Improved from previous. Resolution of Left frontal spikes.   CULTURES: Blood 2/12>>>Group A Strep pan sensitive.  Blood Cx 2/15 >> NG x 5 days Blood Cx 2/14 >> NG final.  Urine 2/12>> negative.  CSF >> Negative  Resp 2/14 > No organisms on gram stain, culture pending  ANTIBIOTICS: Vanc 2/12>>> 2/15 aztreonam  2/12>>> 2/14 Acyclovir>>>2/12  levaquin 2/12->> 2/15 Oseltamavir 2/13 >> Stop date 2/20 MEJII 2/15 >> Ceftriaxone 2/15 >>  SIGNIFICANT EVENTS: Intubated in ED Shock Refractory hypoxia 2/13 > weaning Vent overnight.  2/15 > CVA on MRI 2/16 > Left CC thrombus, started on Heparin.   LINES/TUBES: Right subclavian CVC 2/12>> 2/15 Foley 2/12>> ETT 2/12>> A line 2/12>>>2/14   Physical Exam:  General: acutely ill, pERRLA Neuro:  Intubated, following commands, Weakness on the right, but 4/5 weakness   on the left.  HEENT:jvd, ETT, NG tube, cough +, gag + PULM: diminished on the right, clear on the left.  CV: Normal s1 s2 RRT, No MGR GI: soft, bs wnl, nondistended.  Extremities: no edema, boots in place.     DISCUSSION: 60 y/o with influenza A and right sided pneumonia -strep pyogenes , in significant respiratory distress, intubated for hypoxic respiratory failure found to have L Frontal CVA and LCC thrombus.   ASSESSMENT / PLAN:  PULMONARY A: Pneumonia (necrotizing?) Dense infiltrate- continued daily improvements resp alk mild P:   SBTs but no extubation -very deconditioned & poor  MS Attempt SBT's again today with improving MS Considering Trach on 2/22 if unable to extubate.  Goal fluid balance even > achieved.  ABG's as needed. CXR as needed.  Antibiotics for extended course.   CARDIOVASCULAR A:  Septic shock - resolved SVT (history supports fib at home) adequate cortisol response H./o afib? > a flutter with RVR resolved.  Distal CC thrombus TTE > mildly elevated PA pressure, otherwise mostly normal > unable to do TEE  P:  Lopressor 12.5 bid & prn with parameters. Continuing low dose lasix for now.  May need amio if irregular rhythm returns Heparin gtt -watch hematuria Planning to transition to plavix / asa after Trach.  No further Aflutter / fib at this point.   RENAL A:  AKI. Hyponatremia - resolved NONAG met acidosis- saline   Recent Labs Lab 06/06/15 0240 06/07/15 0218 06/08/15 0359  NA 137 138 137    P:  Lasix 20 daily  k goal greater 4 with svt prior supp k as needed.   GASTROINTESTINAL A:  Question of esophageal stricture, sjorgens P:  Cortrak -Tube feeds to goal Protonix.  LFT normal.    INFECTIOUS A:  Pneumonia (r/o different organism than group A strep ) Doubt infectious clot Flu A P:  S/p Tamiflu.  Ceftriaxone ct length per ID Continued neb Aminoglycoside study drug ID on board   TEE unable- plan for repeat attempt on 2/22, but if unable > ceftx x 6 wks total - will need PICC placed when she is trached.   Endocrine: A:  Hypothyroidism, c/w sick euthryoid Hyperglycemia P:  Ct synthroid  repeat T3 T4 in 2 months Moderate SSI Lantus 10 u qd. For now.   NEUROLOGIC A:  Severe hypoxia requiring paralysis On presentation weakness CVA on MRI Brain c/w right sided deficits.  Left CC A thrombus identified on CTA (doubt infectious) EEG spike activity from left frontal lobe > resolved with repeat EEG.  P:  RASS goal:0  RASS met.  Heparin  gtt for now > Plavix / ASA after trach. If able to extubate will transition earlier.  Statin when able.   Improving neurological prognosis but expect severe debilitation  Need TEE Hypercoagulable workup in the future per neuro  Weakness and mental status improving > PT /OT on vent as able.     FAMILY  - Updates: husband daily  - Inter-disciplinary family meet or Palliative Care meeting due by: day 7        Summary - Gr A strep pna complicated by left CVA & left carotid thrombus ? Embolic, doubt endocarditis but TEE unable, wean as able, but so far unable to extubate due to poor MS and Weakness, but this is improving. Hope to SBT today and possibly extubate.    , MD Resident - PGY 2  

## 2015-06-08 NOTE — Procedures (Signed)
Extubation Procedure Note  Patient Details:   Name: [name] [name] DOB: 1955-05-08 MRN: [id number]   Airway Documentation: Patient had an audible cuff leak prior to extubation, able to follow commands.  Extubated to 4 lpm [place] sat 98%, pt able to speak her name, which hospital she was at and name her husband in room.  Good strong, productive cough of thick clear and white secretions.  Will continue to monitor patient.    Evaluation  O2 sats: stable throughout Complications: No apparent complications Patient did tolerate procedure well. Bilateral Breath Sounds: Clear, Diminished Suctioning: Oral, Airway Yes  Ned Grace 06/08/2015, 9:57 AM

## 2015-06-08 NOTE — Progress Notes (Signed)
OT NOTE  OT order received and appreciated however this conflicts with current bedrest order set. Please increase activity tolerance as appropriate and remove bedrest from orders. . Please contact OT at [phone] if bed rest order is discontinued. OT will hold evaluation at this time and will check back as time allows pending increased activity orders.    Jeri Modena   OTR/L Pager: [phone] Office: [phone] .

## 2015-06-08 NOTE — Progress Notes (Signed)
ANTICOAGULATION CONSULT NOTE - Follow Up Consult  Pharmacy Consult for Heparin  Indication: stroke and A-flutter  Allergies  Allergen Reactions  . Erythromycin Ethylsuccinate Hives  . Keflex [Cephalexin] Hives  . Penicillins   . Sulfa Antibiotics    Patient Measurements: Height: 5\' 2"  (157.5 cm) Weight: 146 lb 13.2 oz (66.6 kg) IBW/kg (Calculated) : 50.1  Vital Signs: Temp: 98.6 F (37 C) (02/21 0400) Temp Source: Core (Comment) (02/21 0400) BP: 124/57 mmHg (02/21 0400) Pulse Rate: 92 (02/21 0400)  Labs:  Recent Labs  06/06/15 0240  06/07/15 0218 06/07/15 1101 06/07/15 2038 06/08/15 0359 06/08/15 0404  HGB 10.3*  --  9.7*  --   --  9.3*  --   HCT 33.3*  --  31.7*  --   --  30.9*  --   PLT 234  --  300  --   --  401*  --   HEPARINUNFRC 0.18*  < > 0.26* 0.24* 0.25*  --  0.39  CREATININE 0.45  --  0.52  --   --  0.78  --   < > = values in this interval not displayed.  Estimated Creatinine Clearance: 65.3 mL/min (by C-G formula based on Cr of 0.78).  Assessment: Heparin level remains subtherapeutic at 0.25 after rate increase. Hematuria is improving, will keep heparin goal low. Hgb slight trend down to 9.7, plts wnl.  Repeat HL is now therapeutic at 0.39 on heparin 950 units/hr. No issues with infusion or bleeding noted.  Goal of Therapy:  Heparin level 0.3-0.5 units/ml Monitor platelets by anticoagulation protocol: Yes   Plan:  Continue heparin 950 units/hr 6h confirmatory HL Monitor daily HL, CBC, s/s of bleed  Andrey Cota. Diona Foley, PharmD, [place] Clinical Pharmacist Pager [phone] 06/08/2015 5:01 AM

## 2015-06-08 NOTE — Progress Notes (Signed)
UR Completed. Jeremia Groot, RN, BSN.  [phone] 

## 2015-06-08 NOTE — Progress Notes (Signed)
STROKE TEAM PROGRESS NOTE   History at time of admission [name] [name] is an 60 y.o. female patient who has been sick with influenza symptoms for the past 4-5 Days with worsening generalized weakness ,. Poor by mouth intake, Per her husband. Today around 8:30 PM when she got up to go to bathroom she had give severe gait instability and couldn't stand up due to generalized weakness. She'll brought into the ER by EMS for further evaluation. Was diagnosed with flu yesterday and started on Tamiflu. History of multiple autoimmune conditions with Sjgren's, rheumatoid arthritis, on plaquenil and prednisone at home.  Patient with severe restrictive distress when she presented to the ER with oxygen saturation in 70s while on nonrebreathing mask. With 100% BiPAP, her O2 sats improved to 91%, Continues to be in severe distress.    Date last known well: Generalized weakness for 4-5 days, Last normal prior to that Time last known well: unknown tPA Given: No: Clinically not suggestive of acute stroke. Last normal greater than 4-5 days ago.     SUBJECTIVE (INTERVAL HISTORY) No family at bedside. She is following  commands such as squeezing hands wiggling toes on the left. Still very   sleepy. Marland Kitchen No changes.has panda tube OBJECTIVE Temp:  [98.4 F (36.9 C)-100.2 F (37.9 C)] 99.7 F (37.6 C) (02/21 1600) Pulse Rate:  [73-104] 92 (02/21 1600) Cardiac Rhythm:  [-] Normal sinus rhythm (02/21 1600) Resp:  [12-24] 21 (02/21 1600) BP: (103-126)/(55-88) 119/59 mmHg (02/21 1600) SpO2:  [95 %-100 %] 95 % (02/21 1600) FiO2 (%):  [40 %] 40 % (02/21 0815) Weight:  [146 lb 13.2 oz (66.6 kg)] 146 lb 13.2 oz (66.6 kg) (02/21 0432)  CBC:   Recent Labs Lab 06/07/15 0218 06/08/15 0359  WBC 31.7* 27.6*  HGB 9.7* 9.3*  HCT 31.7* 30.9*  MCV 90.8 92.5  PLT 300 401*    Basic Metabolic Panel:   Recent Labs Lab 06/03/15 1830  06/05/15 0234 06/05/15 2140  06/07/15 0218 06/08/15 0359  NA  136  < > 138 137  < > 138 137  K 3.2*  < > 4.4 4.0  < > 4.0 4.2  CL 103  < > 105 104  < > 104 104  CO2 26  < > 25 25  < > 27 27  GLUCOSE 175*  < > 175* 143*  < > 195* 161*  BUN 22*  < > 21* 22*  < > 25* 33*  CREATININE 0.59  < > 0.45 0.46  < > 0.52 0.78  CALCIUM 8.1*  < > 8.4* 8.4*  < > 8.2* 8.2*  MG 1.8  --  1.9 1.9  --   --   --   PHOS 3.5  --  3.0  --   --   --   --   < > = values in this interval not displayed.  Lipid Panel:     Component Value Date/Time   CHOL 95 06/03/2015 1830   TRIG 72 06/03/2015 1830   HDL 16* 06/03/2015 1830   CHOLHDL 5.9 06/03/2015 1830   VLDL 14 06/03/2015 1830   LDLCALC 65 06/03/2015 1830   HgbA1c:  Lab Results  Component Value Date   HGBA1C 6.6* 06/04/2015   Urine Drug Screen:     Component Value Date/Time   LABOPIA POSITIVE* 05/30/2015 0558   COCAINSCRNUR NONE DETECTED 05/30/2015 0558   LABBENZ NONE DETECTED 05/30/2015 0558   AMPHETMU NONE DETECTED 05/30/2015 0558   THCU NONE DETECTED  05/30/2015 0558   LABBARB NONE DETECTED 05/30/2015 0558      IMAGING I have personally reviewed the radiological images below and agree with the radiology interpretations.  Mr Brain Wo Contrast 06/03/2015    Acute small LEFT frontal lobe (precentral gyrus, MCA territory) infarct. Mild chronic small vessel ischemic disease.    2D Echocardiogram  - Left ventricle: The cavity size was normal. Systolic function wasnormal. The estimated ejection fraction was in the range of 60%to 65%. Wall motion was normal; there were no regional wallmotion abnormalities. - Mitral valve: There was mild regurgitation. - Tricuspid valve: There was mild regurgitation. - Pulmonic valve: Poorly visualized. - Pulmonary arteries: PA peak pressure: 35 mm Hg (S). Impressions:  - There was no evidence of a vegetation.   CTA head and neck -  1. 7 mm filling defect in the distal left common carotid artery consistent with a small focus of thrombus. 2. Otherwise unremarkable  appearance of the cervical carotid and vertebral arteries. 3. No major intracranial arterial branch occlusion or significant stenosis. Slight attenuation of distal left MCA branch vessels in the region of the left frontal infarct. 4. Extensive right lung consolidation, incompletely visualized. Small right pleural effusion.  TEE - Could not perform due to esophageal strictures.  EEG 06/07/15 : This is a marked improvement in the patient's EEG compared to prior EEGs, and is within ranges of normal limits. Physical exam  Temp:  [98.4 F (36.9 C)-100.2 F (37.9 C)] 99.7 F (37.6 C) (02/21 1600) Pulse Rate:  [73-104] 92 (02/21 1600) Resp:  [12-24] 21 (02/21 1600) BP: (103-126)/(55-88) 119/59 mmHg (02/21 1600) SpO2:  [95 %-100 %] 95 % (02/21 1600) FiO2 (%):  [40 %] 40 % (02/21 0815) Weight:  [146 lb 13.2 oz (66.6 kg)] 146 lb 13.2 oz (66.6 kg) (02/21 0432)  General - Well nourished, well developed, intubated on sedation.  Ophthalmologic - Fundi not visualized due to positioning and ET tube.  Cardiovascular - irregular  Neuro exam   Awake alert squeezes my fingers on command today left hand and wiggles toes on the left to command.   fundoscopic exam could not be done. Opens eyes briefly on deep pain stimulation.Marland Kitchen PERRL, EOMI, positive corneal and gag, conjugate gaze. Marland Kitchen On pain stimulation, withdraws on the left, flaccid on the right, LUE increased muscle tone. Toes equiv. Sensation, coordination or gait not tested.    ASSESSMENT/PLAN [name] is a 60 y.o. female with history of Sjogren's disease, seizure, hypothyroidism and dysphagia, diagnosed with the flu 4 days prior to arrival, presenting with weakness and altered mental status. Code stroke called on arrival due to right facial droop and flaccid paralysis R arm. In the ED she was found to have hypooxic respiratory failure. LP negative for CNS infection. MRI showed left MCA embolic stroke.  Stroke:   left MCA infarct, embolic  secondary to unclear source. Endocarditis vs. Aflutter/afib vs. hyercoagulable state due to severe infection.    Resultant  R hemiplegia  MRI  small L frontal lobe MCA infart  CTA head and neck - left CCA thrombus, no mycotic aneurysm.  2D Echo  No SOE, vegetation  TEE to be scheduled to eval for endocarditis - Could not perform due to esophageal strictures  EEG spike activity from the left frontal lobe, likely associated with current stroke  LDL 65  HgbA1c 6.6  Hypercoagulable work up pending  Heparin IV for VTE prophylaxis Diet NPO time specified  No antithrombotic prior to admission, now on  Heparin drip. Due to CCA thrombus, aflutter and no mycotic aneurysm seen, start on heparin drip low intensity for anticoagulation.   Ongoing aggressive stroke risk factor management  Therapy recommendations:  pending   Disposition:  pending   Respiratory Failure  Shock  Refractory hypoxia  Pneumonia / FLU  Intubated on admission in ED  CTA neck showed extensive right LL consolidation  Flu diagnosis 4 days prior to admission, started on Tamiflu at that time  On rocephin and tamiflu  WBC trending down - 24.8 on 06/06/2015   Atrial Flutter  EKG shows atrial flutter with variable A-V block 2/15 around 8p  Start heparin drip for anticoagulation in the setting of left CCA thrombus.  SVT on admission   As per husband, pt did have once heart palpitation at home before admission  ? Endocarditis  Will recommend TEE ASAP to rule out endocarditis -  Could perform due to esophageal strictures  No mycotic aneurysm seen on CTA head  Blood cultures 2/12 Group a strep pan sensitive, 2/14 neg x 48h, 2/15 ng x 24h  On heparin drip as no mytoctic aneurysm, and blood cultures more than 48 hours no growth  Sjogren syndrome  On plaquenil and steroids at home, currently off  Hypercoagulable and autoimmune work up to rule out antiphospholipid syndrome.  LUE increased  tone  Unclear etiology  EEG no seizure seen but left frontal spike activity - not fit to the picture  Need repeat EEG Monday - ordered  Hypotension - Hx hypertension  Stable - SBP approx 100  Hyperlipidemia  Home meds:  No statin  Now on lipitor 40 mg daily  LDL 65  LDL goal < 70  Continue statin at discharge  Other Stroke Risk Factors  Advanced age  Other Active Problems  Hypothyroidism  Hx Dysphagia prior to admission  AKI and hyponatremia resolved  ? Esophageal stricture secondary to Sjogren's  Raynaud's   Scoliosis  Fever - [facility] day # 9         Personally examined patient and images, and have participated in and made any corrections needed to history, physical, neuro exam,assessment and plan as stated above.  I have personally obtained the history, evaluated lab date, reviewed imaging studies and agree with radiology interpretations.   Ms/ Monast is a 60 y/o woman with a history of Sjogren's disease, hypothyroidism and dysphagia. She was diagnosed with the flu (A) and started on tamiflu.   Discussed with Dr. Elsworth Soho. Patient has esophageal stricture and hence TEE will not be possible. Patient has not had demonstrated atrial flutter (during the hospitalization hence I do not think she needs long-term anticoagulation. Patient can be transitioned to aspirin and Plavix after she gets a Peg tube. She was treated empirically for endocarditis with 6 weeks of antibiotics. Stroke team will sign off. Kindly call for questions   Antony Contras, MD Stroke Neurology [id number] [facility]       To contact Stroke Continuity provider, please refer to [email or web address] After hours, contact General Neurology

## 2015-06-08 NOTE — Research (Signed)
ME1100-CL-103  CRITICAL VALUE ALERT  Critical value received:  AST 147 (processed by research lab; drawn 06/06/15)  Date of notification:  06/08/15  Time of notification:  [id number]  Critical value read back:Yes  Nurse who received alert:  Doreatha Martin, RN  MD notified (1st page):  Titus Mould  Time of first page:  1524  MD notified (2nd page):  Time of second page:  Responding MD:  Titus Mould  Time MD responded:  [id number], no new orders.  Confirmed that study drug has been completed; patient extubated 06/08/15.

## 2015-06-08 NOTE — Research (Signed)
ME1100-CL-103   CRITICAL VALUE ALERT  Critical value received:  WBC 36.0 (procesed by research central lab, from lab work drawn on Feb 20,2017)  Date of notification:  06/08/15  Time of notification:  1510  Critical value read back:Yes  Nurse who received alert:  Doreatha Martin, RN  MD notified (1st page):  Titus Mould  Time of first page:  1513  MD notified (2nd page):  Time of second page:  Responding MD:  Titus Mould  Time MD responded:  [id number], no new orders.

## 2015-06-08 NOTE — Care Management Note (Signed)
Case Management Note  Patient Details  Name: [name] [name] MRN: [id number] Date of Birth: 1956-04-08  Subjective/Objective:   Pt admitted with sepsis, positive for flu and had to be intubated             Action/Plan:   06/08/2015  Pt successfully extubated today.  CM will continue to monitor for disposition needs  06/07/15 Attending recommended LTAC referral due to pt being unable to wean off vent (day 8) at this time due to deconditioning and poor mental status - planned for trach 2/22.  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  CM consulted with Physician Advisor on appropriateness for LTAC - CM will continue to monitor.  06/05/15 CM received call from [name] for [facility] hired by [facility] to assist with [facility] needs at discharge, CM informed resource that pt remains on ventilator.    Pt is independent from home with husband [name], pt is a  Oncologist.  CM will continue to monitor for disposition needs   Expected Discharge Date:                  Expected Discharge Plan:  Home/Self Care  In-House Referral:     Discharge planning Services  CM Consult  Post Acute Care Choice:    Choice offered to:     DME Arranged:    DME Agency:     HH Arranged:    HH Agency:     Status of Service:  In process, will continue to follow  Medicare Important Message Given:    Date Medicare IM Given:    Medicare IM give by:    Date Additional Medicare IM Given:    Additional Medicare Important Message give by:     If discussed at [place] of Stay Meetings, dates discussed:    Additional Comments:  [name], RN 06/08/2015, 11:42 AM Case Management Note  Patient Details  Name: [name] [name] MRN: [id number] Date of Birth: [birth year]  Subjective/Objective:                    Action/Plan:   Expected Discharge Date:                  Expected Discharge Plan:  Home/Self Care  In-House Referral:      Discharge planning Services  CM Consult  Post Acute Care Choice:    Choice offered to:     DME Arranged:    DME Agency:     HH Arranged:    HH Agency:     Status of Service:  In process, will continue to follow  Medicare Important Message Given:    Date Medicare IM Given:    Medicare IM give by:    Date Additional Medicare IM Given:    Additional Medicare Important Message give by:     If discussed at [place] of Stay Meetings, dates discussed:    Additional Comments:  [name], RN 06/08/2015, 11:42 AM

## 2015-06-08 NOTE — Progress Notes (Signed)
ANTICOAGULATION CONSULT NOTE - Follow Up Consult  Pharmacy Consult for heparin Indication: aflutter/stroke  Allergies  Allergen Reactions  . Erythromycin Ethylsuccinate Hives  . Keflex [Cephalexin] Hives  . Penicillins   . Sulfa Antibiotics     Patient Measurements: Height: 5\' 2"  (157.5 cm) Weight: 146 lb 13.2 oz (66.6 kg) IBW/kg (Calculated) : 50.1 Heparin Dosing Weight: 65 kg  Vital Signs: Temp: 99.5 F (37.5 C) (02/21 1100) Temp Source: Oral (02/21 0916) BP: 113/88 mmHg (02/21 1100) Pulse Rate: 84 (02/21 1100)  Labs:  Recent Labs  06/06/15 0240  06/07/15 0218  06/07/15 2038 06/08/15 0359 06/08/15 0404 06/08/15 1052  HGB 10.3*  --  9.7*  --   --  9.3*  --   --   HCT 33.3*  --  31.7*  --   --  30.9*  --   --   PLT 234  --  300  --   --  401*  --   --   HEPARINUNFRC 0.18*  < > 0.26*  < > 0.25*  --  0.39 0.36  CREATININE 0.45  --  0.52  --   --  0.78  --   --   < > = values in this interval not displayed.  Estimated Creatinine Clearance: 65.3 mL/min (by C-G formula based on Cr of 0.78).   Assessment: 60 yo f admitted on 2/11 with respiratory distress.  Pharmacy is consulted to dose heparin infusion for aflutter and CCA thrombus. 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 Pt has been having hematuria for 3 days now - so aim towards lower end of goal per CCM.  HL is therapeutic x 2 (0.39, 0.36) on 950 units/hr.  Hgb stable ~9.3, plts 401. No issues per RN. Will monitor hematuria.  Goal of Therapy:  Heparin level 0.3-0.5 units/ml Monitor platelets by anticoagulation protocol: Yes   Plan:  - Continue heparin infusion at 950 units/hr - Daily HL, CBC - Monitor hematuria and other s/s of bleeding  Cassie L. Nicole Kindred, PharmD PGY2 Infectious Diseases Pharmacy Resident Pager: [phone] 06/08/2015 12:12 PM

## 2015-06-09 ENCOUNTER — Inpatient Hospital Stay (HOSPITAL_COMMUNITY): Payer: BC Managed Care – PPO

## 2015-06-09 ENCOUNTER — Encounter (HOSPITAL_COMMUNITY): Payer: BC Managed Care – PPO

## 2015-06-09 LAB — CBC
HCT: 32 % — ABNORMAL LOW (ref 36.0–46.0)
Hemoglobin: 9.5 g/dL — ABNORMAL LOW (ref 12.0–15.0)
MCH: 27.8 pg (ref 26.0–34.0)
MCHC: 29.7 g/dL — ABNORMAL LOW (ref 30.0–36.0)
MCV: 93.6 fL (ref 78.0–100.0)
PLATELETS: 479 10*3/uL — AB (ref 150–400)
RBC: 3.42 MIL/uL — AB (ref 3.87–5.11)
RDW: 20.2 % — ABNORMAL HIGH (ref 11.5–15.5)
WBC: 24 10*3/uL — AB (ref 4.0–10.5)

## 2015-06-09 LAB — GLUCOSE, CAPILLARY
GLUCOSE-CAPILLARY: 76 mg/dL (ref 65–99)
Glucose-Capillary: 109 mg/dL — ABNORMAL HIGH (ref 65–99)
Glucose-Capillary: 129 mg/dL — ABNORMAL HIGH (ref 65–99)
Glucose-Capillary: 136 mg/dL — ABNORMAL HIGH (ref 65–99)
Glucose-Capillary: 149 mg/dL — ABNORMAL HIGH (ref 65–99)
Glucose-Capillary: 156 mg/dL — ABNORMAL HIGH (ref 65–99)

## 2015-06-09 LAB — HEPATIC FUNCTION PANEL
ALK PHOS: 563 U/L — AB (ref 38–126)
ALT: 76 U/L — AB (ref 14–54)
AST: 102 U/L — AB (ref 15–41)
Albumin: 1.7 g/dL — ABNORMAL LOW (ref 3.5–5.0)
BILIRUBIN DIRECT: 0.1 mg/dL (ref 0.1–0.5)
BILIRUBIN INDIRECT: 0.3 mg/dL (ref 0.3–0.9)
TOTAL PROTEIN: 6.6 g/dL (ref 6.5–8.1)
Total Bilirubin: 0.4 mg/dL (ref 0.3–1.2)

## 2015-06-09 LAB — BASIC METABOLIC PANEL
ANION GAP: 12 (ref 5–15)
BUN: 30 mg/dL — AB (ref 6–20)
CALCIUM: 8.5 mg/dL — AB (ref 8.9–10.3)
CO2: 25 mmol/L (ref 22–32)
Chloride: 100 mmol/L — ABNORMAL LOW (ref 101–111)
Creatinine, Ser: 0.83 mg/dL (ref 0.44–1.00)
Glucose, Bld: 144 mg/dL — ABNORMAL HIGH (ref 65–99)
Potassium: 4.3 mmol/L (ref 3.5–5.1)
SODIUM: 137 mmol/L (ref 135–145)

## 2015-06-09 LAB — HEPARIN LEVEL (UNFRACTIONATED): HEPARIN UNFRACTIONATED: 0.37 [IU]/mL (ref 0.30–0.70)

## 2015-06-09 MED ORDER — WHITE PETROLATUM GEL
Status: AC
  Administered 2015-06-09: 15:00:00
  Filled 2015-06-09: qty 1

## 2015-06-09 MED ORDER — METOPROLOL TARTRATE 25 MG/10 ML ORAL SUSPENSION: 12.5000 mg | Freq: Two times a day (BID) | ORAL | Status: DC

## 2015-06-09 NOTE — Progress Notes (Signed)
ANTICOAGULATION CONSULT NOTE - Follow Up Consult  Pharmacy Consult for heparin Indication: aflutter/stroke  Allergies  Allergen Reactions  . Erythromycin Ethylsuccinate Hives  . Keflex [Cephalexin] Hives  . Penicillins   . Sulfa Antibiotics     Patient Measurements: Height: 5\' 2"  (157.5 cm) Weight: 141 lb 8.6 oz (64.2 kg) IBW/kg (Calculated) : 50.1 Heparin Dosing Weight: 65 kg  Vital Signs: Temp: 99.7 F (37.6 C) (02/22 0900) Temp Source: Core (Comment) (02/22 0400) BP: 121/70 mmHg (02/22 0900) Pulse Rate: 102 (02/22 0900)  Labs:  Recent Labs  06/07/15 0218  06/08/15 0359 06/08/15 0404 06/08/15 1052 06/09/15 0245 06/09/15 0340  HGB 9.7*  --  9.3*  --   --   --  9.5*  HCT 31.7*  --  30.9*  --   --   --  32.0*  PLT 300  --  401*  --   --   --  479*  HEPARINUNFRC 0.26*  < >  --  0.39 0.36 0.37  --   CREATININE 0.52  --  0.78  --   --   --  0.83  < > = values in this interval not displayed.  Estimated Creatinine Clearance: 61.8 mL/min (by C-G formula based on Cr of 0.83).   Assessment: 60 yo f admitted on 2/11 with respiratory distress.  Pharmacy is consulted to dose heparin infusion for aflutter and CCA thrombus. 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 Pt has been having hematuria for 3 days now - so aim towards lower end of goal per CCM.  HL is therapeutic x 3 (0.39, 0.36, 0.37) on 950 units/hr.  Hgb stable ~953, plts 479. No issues per RN. Will monitor hematuria.  Goal of Therapy:  Heparin level 0.3-0.5 units/ml Monitor platelets by anticoagulation protocol: Yes   Plan:  - Continue heparin infusion at 950 units/hr - Daily HL, CBC - Monitor hematuria and other s/s of bleeding  Jacquelin Krajewski L. Nicole Kindred, PharmD PGY2 Infectious Diseases Pharmacy Resident Pager: [phone] 06/09/2015 10:48 AM

## 2015-06-09 NOTE — Progress Notes (Signed)
MBSS complete. Full report located under chart review in imaging section. [place] Natarajan, MA CCC-SLP [phone]  

## 2015-06-09 NOTE — Progress Notes (Signed)
Name:[name] [name] RFF:[id number] DOB:11-13-1955   ADMISSION DATE: 05/29/2015 CONSULTATION DATE: 05/30/15  REFERRING MD: Dr. Leonides Schanz  CHIEF COMPLAINT: Hypoxic respiratory failure  HISTORY OF PRESENT ILLNESS:  [name] is a 60 y/o woman with a history of Sjogren's disease, hypothyroidism and dysphagia. She was diagnosed with the flu (A) about 4 days PTA and started on tamiflu. 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     OVERNIGHT EVENTS - Mild temp overnight.  No acute events otherwise.  Doing very well, extubated yesterday.  Conversant. Wants something to drink this am.    BP 119/61 mmHg  Pulse 94  Temp(Src) 99.7 F (37.6 C) (Core (Comment))  Resp 21  Ht 5' 2" (1.575 m)  Wt 141 lb 8.6 oz (64.2 kg)  BMI 25.88 kg/m2  SpO2 96%     Recent Labs Lab 06/07/15 0218 06/08/15 0359 06/09/15 0340  NA 138 137 137  K 4.0 4.2 4.3  CL 104 104 100*  CO2 _0 BUN 25* 33* 30*  CREATININE 0.52 0.78 0.83  GLUCOSE 195* 161* 144*    Recent Labs Lab 06/07/15 0218 06/08/15 0359 06/09/15 0340  HGB 9.7* 9.3* 9.5*  HCT 31.7* 30.9* 32.0*  WBC 31.7* 27.6* 24.0*  PLT 300 401* 479*   CBG (last 3)   Recent Labs  06/08/15 2015 06/08/15 2354 06/09/15 0338  GLUCAP 126* 129* 136*    ABG    Component Value Date/Time   PHART 7.510* 06/04/2015 1233   PCO2ART 34.1* 06/04/2015 1233   PO2ART 76.5* 06/04/2015 1233   HCO3 26.9* 06/04/2015 1233   TCO2 27.9 06/04/2015 1233   ACIDBASEDEF 2.0 06/04/2015 0956   O2SAT 96.5 06/04/2015 1233    Imaging Dg Chest Port 1 View  06/08/2015  CLINICAL DATA:  Respiratory failure EXAM: PORTABLE CHEST 1 VIEW COMPARISON:  06/07/2015 FINDINGS: Cardiac shadow is stable. A feeding catheter is again identified. Patchy infiltrate is noted throughout the right lung stable from the prior exam. Some slight increased density is  now seen in the left base which may represent some early infiltrate as well. No other focal abnormality is noted. IMPRESSION: Stable right sided pneumonia. New left basilar infiltrative density. Electronically Signed   By: [name] [name] M.D.   On: 06/08/2015 10:11   STUDIES:  Head CT 2/12 >> no acute hemorrhagic infarct.  Chest CT 2/12 >> ground glass opacities, consolidation c/w pneumonia.  CXR 2/14 / 2/15 >> little change. R lung consolidation, ? L infiltrate.  CXR 2/16 >> improving consolidation on the right MRI Head 2/15 > L Frontal precentral gyrus CVA CTA Head / Neck 2/16 > L CC thrombus CXR 2/20 > R pneumonia with slow improvement.  EEG 2/20 > Improved from previous. Resolution of Left frontal spikes.   CULTURES: Blood 2/12>>>Group A Strep pan sensitive.  Blood Cx 2/15 >> NG final.  Blood Cx 2/14 >> NG final.  Urine 2/12>> negative.  CSF >> Negative  Resp 2/14 > No organisms on gram stain, culture pending  ANTIBIOTICS: Vanc 2/12>>> 2/15 aztreonam  2/12>>> 2/14 Acyclovir>>>2/12  levaquin 2/12->> 2/15 Oseltamavir 2/13 >> Stop date 2/20 MEJII 2/15 >> Ceftriaxone 2/15 >> stop date 2/25.   SIGNIFICANT EVENTS: Intubated in ED Shock Refractory hypoxia 2/13 > weaning Vent overnight.  2/15 > CVA on MRI 2/16 > Left CC thrombus, started on Heparin.  2/21 > extubated.   LINES/TUBES: Right subclavian CVC 2/12>>  2/15 Foley 2/12>> ETT 2/12>> A line 2/12>>>2/14   Physical Exam:  General: sitting up in bed, talkative.  Neuro:  Following commands, AAOx3, 4/5 weakness RLE, unable to move RUE, Left side with 4/5 weakness on the left.  HEENT:NCAT, NG tube, O/P clear. No LAD PULM: Crackles / rales on the right, left CTA, appropriate rate, unlabored.  CV: Normal s1 s2 RRT, No MGR GI: S, NT, ND, +BS, No organomegaly palpable.  Extremities: no edema, boots in place.     DISCUSSION: 60 y/o with influenza A and right sided pneumonia -strep pyogenes , in significant respiratory  distress, intubated for hypoxic respiratory failure found to have L Frontal CVA and LCC thrombus. Extubated and doing well.   ASSESSMENT / PLAN:  PULMONARY A: Pneumonia (necrotizing?) Dense infiltrate- continued daily improvements resp alk mild - resolved.  P:  Extubated doing well.  Goal fluid balance even > achieved.  CXR as needed.  Antibiotics for extended course to treat pneumonia if unable to obtain TEE.   CARDIOVASCULAR A:  Septic shock - resolved SVT (history supports fib at home) adequate cortisol response H./o afib? > a flutter with RVR resolved.  Distal CC thrombus TTE > mildly elevated PA pressure, otherwise mostly normal > unable to do TEE  P:  Lopressor 12.5 bid & prn with parameters. Continuing low dose lasix for now. > stop tomorrow.  May need amio if irregular rhythm returns Heparin gtt -watch hematuria Planning to transition to plavix / asa after able to take PO.   No further Aflutter / fib at this point.   RENAL A:  AKI. Hyponatremia - resolved NONAG met acidosis- saline - resolved.   Recent Labs Lab 06/07/15 0218 06/08/15 0359 06/09/15 0340  NA 138 137 137    P:  Lasix 20 daily > stop tomorrow.   k goal greater 4 with svt prior supp k as needed.   GASTROINTESTINAL A:  Question of esophageal stricture, sjorgens P:  Cortrak -Tube feeds to goal, d/c after swallow eval.  Protonix.  LFT normal.    INFECTIOUS A:  Pneumonia (r/o different organism than group A strep ) Doubt infectious clot Flu A P:  S/p Tamiflu.  Ceftriaxone ct length per ID Continued neb Aminoglycoside study drug ID on board   TEE unable- plan for repeat attempt on 2/22, but if unable > ceftx x 6 wks total  Will need PICC   Endocrine: A:  Hypothyroidism, c/w sick euthryoid Hyperglycemia P:  Ct synthroid  repeat T3 T4 in 2 months Moderate SSI Lantus 10 u qd. For now. D/C insulin after taking PO.    NEUROLOGIC A:  Severe hypoxia  requiring paralysis On presentation weakness CVA on MRI Brain c/w right sided deficits.  Left CC A thrombus identified on CTA (doubt infectious) EEG spike activity from left frontal lobe > resolved with repeat EEG.  P:  RASS goal:0  RASS met.  Heparin gtt for now > Plavix / ASA when taking PO.   Statin when able.   Improving neurological prognosis but expect severe debilitation  Need TEE, but may be unable due to esophageal stricture.  Hypercoagulable workup in the future per neuro  NEEDS PT/OT.    FAMILY  - Updates: husband daily  - Inter-disciplinary family meet or Palliative Care meeting due by: day 7        Summary - Gr A strep pna complicated by left CVA & left carotid thrombus ? Embolic, doubt endocarditis but TEE unable, extubated yesterday. Doing well,  needing PT/OT.   Paula Compton, MD Resident - PGY 2

## 2015-06-09 NOTE — Clinical Social Work Note (Signed)
Clinical Social Work Assessment  Patient Details  Name: Lumina Giannuzzi MRN: [id number] Date of Birth: 12-26-1955  Date of referral:  06/09/15               Reason for consult:  Facility Placement                Permission sought to share information with:  Facility Sport and exercise psychologist, Family Supports Permission granted to share information::  Yes, Verbal Permission Granted  Name::     Cherene Rosemann- Spouse [phone])  Agency::  Skilled Nursing Facilities  Relationship::     Contact Information:     Housing/Transportation Living arrangements for the past 2 months:  [place] of Information:  Patient, Spouse Patient Interpreter Needed:  None Criminal Activity/Legal Involvement Pertinent to Current Situation/Hospitalization:  No - Comment as needed Significant Relationships:  Spouse, Adult Children, Community Support Lives with:  Spouse Do you feel safe going back to the place where you live?  Yes Need for family participation in patient care:  Yes (Comment)  Care giving concerns:  Patient's husband reports that his first preference is CIR but is open to SNF placement if necessary.    Social Worker assessment / plan:  Patient is a 60 YO Caucasian female who was admitted due to hypoxic respiratory failure due to flu. She has a PMH of Sjogren's Disease, Hypothyroidism, and Dysphagia. CSW engaged with Patient and her husband at Patient's bedside to introduce self, role of CSW, and begin discussion of discharge planning. CSW explained that ideally CIR is the plan, however, if Patient does not meet criteria for CIR, we will do a backup SNF placement. 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 Husband reports a lot of strong supports in place, particularly surrounding their church community. Patient's husband reports that he is a Company secretary at [employer] in [place] and is also a Materials engineer. CSW  discussed SNF process and provided Patient and husband with list of SNF's in the local area for further research. Patient's husband reports that he is familiar with [facility] and that would be his first SNF choice if insurance is able to cover it. Patient and family agreed to review and discuss and asked if CSW could return on tomorrow. CSW will proceed with SNF placement as a back up if patient does not qualify for CIR.   Employment status:  Therapist, music:  Managed Care PT Recommendations:  Inpatient Rehab Consult, [place] / Referral to community resources:  [place]  Patient/Family's Response to care:  Patient reports that following CIR/SNF placement, she would like to return to prior level of independence. Patient is open to placement if necessary.  Patient/Family's Understanding of and Emotional Response to Diagnosis, Current Treatment, and Prognosis:  Patient and husband able to verbalize understanding of Patient's current diagnosis, treatment, and prognosis. No concerns reported at this time.   Emotional Assessment Appearance:  Appears stated age Attitude/Demeanor/Rapport:   (Cooperative) Affect (typically observed):  Appropriate Orientation:  Oriented to Self, Oriented to Place, Oriented to Situation Alcohol / Substance use:  Not Applicable Psych involvement (Current and /or in the community):  No (Comment)  Discharge Needs  Concerns to be addressed:  No discharge needs identified Readmission within the last 30 days:  No Current discharge risk:  None Barriers to Discharge:  No Barriers Identified   Judeth Horn, LCSW 06/09/2015, 12:08 PM

## 2015-06-09 NOTE — Progress Notes (Addendum)
Rehab Admissions Coordinator Note:  Patient was screened by Cleatrice Burke for appropriateness for an Inpatient Acute Rehab Consult per OT recommendation and family request.   At this time, we are recommending Inpatient Rehab consult. Noted RN CM has asked attending MD for order.  Cleatrice Burke 06/09/2015, 12:07 PM  I can be reached at [phone].

## 2015-06-09 NOTE — Research (Signed)
ME 1100-CL-103:  CRITICAL VALUE ALERT  Critical value received:  APTT 96.4 (from blood work sent to study central laboratory)  Date of notification:  06/09/15   Time of notification:  16:22  Critical value read back:Yes.    Nurse who received alert:  Doreatha Martin, RN  MD notified (1st page):  Titus Mould  Time of first page:  16:35  MD notified (2nd page):  Time of second page:  Responding MD:  Titus Mould  Time MD responded:  16:35  Patient on heparin gtt.

## 2015-06-09 NOTE — Progress Notes (Signed)
Tube feeding off for abdominal ultrasound

## 2015-06-09 NOTE — Evaluation (Signed)
Occupational Therapy Evaluation Patient Details Name: [name] [name] MRN: [id number] DOB: 03/08/1956 Today's Date: 06/09/2015    History of Present Illness 78 female admitted with L Frontal CVA presumed embolic from L CCA thrombus. Pt wtih previous dx of flu 4 days prior to admission and started tamiflu. Ct (+) dense R infiltrated with volume loss  (+) PNA, group A strep bacteremia acute respiratory failure.  Pt intubated 05/29/15-06/08/15 PMH: sjogrens disease, hypothyroidism dysphagia seizures, adenopathy, scoliosis, spleen anomaly, anemnia, raynaud disease, osteopenia   Clinical Impression   PT admitted with L frontal CVA with respiratory failure due to PNA with + flu . Pt currently with functional limitiations due to the deficits listed below (see OT problem list). PTA was independent with adls and iadls Pt will benefit from skilled OT to increase their independence and safety with adls and balance to allow discharge CIR. Spouse very supportive and specifically requesting CIR for his wife. Spouse visits patients as a pastor states "you do wonderful work here and I would very much like her to get rehab here."  HR 122 at EOB initially RR 20-25      Follow Up Recommendations  CIR    Equipment Recommendations       Recommendations for Other Services Rehab consult     Precautions / Restrictions Precautions Precautions: Fall Precaution Comments: R hemiplegia      Mobility Bed Mobility Overal bed mobility: Needs Assistance Bed Mobility: Supine to Sit;Sit to Supine     Supine to sit: Mod assist Sit to supine: Max assist   General bed mobility comments: required (A) to place BIL LE back into bed  Transfers                 General transfer comment: not attempted at this time due to recommend +2 (A) for transfer and pending transfer to MBS    Balance Overall balance assessment: Needs assistance Sitting-balance support: Bilateral upper extremity supported;Feet  supported Sitting balance-Leahy Scale: Fair                                      ADL Overall ADL's : Needs assistance/impaired Eating/Feeding: NPO   Grooming: Wash/dry face;Minimal assistance;Bed level (L UE)               Lower Body Dressing: Maximal assistance;Bed level Lower Body Dressing Details (indicate cue type and reason): OT don sock over toes and pt able with (A) to pull sock over bil feet               General ADL Comments: Pt tolerated EOB sitting for ~ 5 minutes. pt attempting to move R Ue and problem solve balance at EOB. pt encouraged to keep attempting to move R UE. pt with incr effort and attempts demonstrates movement. PT fixated on "dry" sensation from NPO status and pending MBS     Vision Vision Assessment?: No apparent visual deficits   Perception     Praxis      Pertinent Vitals/Pain Pain Assessment: No/denies pain     Hand Dominance Right   Extremity/Trunk Assessment Upper Extremity Assessment Upper Extremity Assessment: RUE deficits/detail RUE Deficits / Details: demonstrates shoulder extension, shoulder flexion shoulder abduction shoulder adduction, elbow flexion / extension supination . Pt does not actively demonstrate hand or wrist activation at this time. pt educated on using L UE at wrist to(A) R UE.  RUE Coordination: decreased fine motor;decreased  gross motor   Lower Extremity Assessment Lower Extremity Assessment: Defer to PT evaluation;RLE deficits/detail RLE Deficits / Details: demonstrates hip flexion, knee flexion ankle flexion with OT eval   Cervical / Trunk Assessment Cervical / Trunk Assessment: Other exceptions (scoliosis)   Communication Communication Communication: No difficulties   Cognition Arousal/Alertness: Awake/alert Behavior During Therapy: Flat affect Overall Cognitive Status: Impaired/Different from baseline Area of Impairment: Orientation;Awareness Orientation Level: Disoriented  to;Place;Time;Situation         Awareness: Intellectual   General Comments: pt educated on date/ location. pt very fixated on "dry" sensation and pending MBS   General Comments       Exercises       Shoulder Instructions      Home Living Family/patient expects to be discharged to:: Private residence Living Arrangements: Spouse/significant other Available Help at Discharge: Family Type of Home: House Home Access: Stairs to enter [employer] of Steps: 3   Home Layout: Two level;Able to live on main level with bedroom/bathroom;Full bath on main level     Bathroom Shower/Tub: Walk-in shower;Tub/shower unit (has both)   Biochemist, clinical: Standard     Home Equipment: Shower seat;Shower seat - built in (has shower with built in and has seat from previous injury)   Additional Comments: spouse works as a Research scientist (physical sciences) and patient is a Oncologist. husband visits [place] to see patients frequently      Prior Functioning/Environment Level of Independence: Independent             OT Diagnosis: Generalized weakness;Cognitive deficits;Hemiplegia dominant side   OT Problem List: Decreased strength;Decreased range of motion;Decreased activity tolerance;Impaired balance (sitting and/or standing);Decreased coordination;Decreased cognition;Decreased safety awareness;Decreased knowledge of use of DME or AE;Decreased knowledge of precautions;Cardiopulmonary status limiting activity;Impaired sensation;Impaired UE functional use   OT Treatment/Interventions: Self-care/ADL training;Therapeutic exercise;Neuromuscular education;DME and/or AE instruction;Energy conservation;Therapeutic activities;Cognitive remediation/compensation;Patient/family education;Balance training    OT Goals(Current goals can be found in the care plan section) Acute Rehab OT Goals Patient Stated Goal: to get something to eat OT Goal Formulation: With patient/family Time For Goal Achievement:  06/23/15 Potential to Achieve Goals: Good  OT Frequency: Min 3X/week   Barriers to D/C:            Co-evaluation              End of Session Nurse Communication: Mobility status;Precautions  Activity Tolerance: Patient tolerated treatment well Patient left: in bed;with call bell/phone within reach;with SCD's reapplied   Time: [id number] OT Time Calculation (min): 25 min Charges:  OT General Charges $OT Visit: 1 Procedure OT Evaluation $OT Eval High Complexity: 1 Procedure OT Treatments $Therapeutic Activity: 8-22 mins G-Codes:    Peri Maris 06-11-2015, 10:10 AM  Jeri Modena   OTR/L Pager: [phone] Office: [phone] .

## 2015-06-09 NOTE — Progress Notes (Signed)
Nutrition Follow-up  DOCUMENTATION CODES:   Not applicable  INTERVENTION:  -Continue Vital AF 1.2 via Cortrak tube @ 55 ml/h (1320 ml/day) to provide 1584 calories, 99 gm protein, 1071 ml free water daily  NUTRITION DIAGNOSIS:   Inadequate oral intake related to inability to eat as evidenced by NPO status.  ongoing  GOAL:   Patient will meet greater than or equal to 90% of their needs  Met  MONITOR:   Vent status, Labs, Weight trends, TF tolerance, I & O's  REASON FOR ASSESSMENT:   Consult Enteral/tube feeding initiation and management  ASSESSMENT:   60 y/o woman with a history of Sjogren's disease, hypothyroidism and dysphagia. She was diagnosed with the flu (A) about 4 days ago and started on tamiflu. According to her husband, she had actually been improving the 24 hours prior to admission with fever resolving and improvement in her nausea and vomiting. He reports that the evening of admission she developed weakness and altered mental status. She was brought to [facility] ED via ambulance.  Pt extubated yesterday morning, doing well. Septic shock has resolved. She is still NPO at this time.   She continues to receive Vital AF 1.2 @ 55 via Cortrak tube. TEE could not be completed due to esophageal stricture. Per neurology, pt will possibly receive PEG. SLP eval pending.  RD will continue to follow.  Diet Order:  Diet NPO time specified  Skin:  Reviewed, no issues  Last BM:  2/17  Height:   Ht Readings from Last 1 Encounters:  05/30/15 5' 2"  (1.575 m)    Weight:   Wt Readings from Last 1 Encounters:  06/09/15 141 lb 8.6 oz (64.2 kg)    Ideal Body Weight:  50 kg  BMI:  Body mass index is 25.88 kg/(m^2).  Estimated Nutritional Needs:   Kcal:  1540  Protein:  95-110 gm  Fluid:  1.6-1.8 L  EDUCATION NEEDS:   No education needs identified at this time  Satira Anis. Nabila Albarracin, MS, RD LDN After Hours/Weekend Pager [phone]

## 2015-06-09 NOTE — Care Management Note (Signed)
Case Management Note  Patient Details  Name: [name] [name] MRN: [id number] Date of Birth: 1956-03-02  Subjective/Objective:   Pt admitted with sepsis, positive for flu and had to be intubated             Action/Plan:   06/09/2015  CIR recommended, attending to write consult order.  CSW consulted for back up plan.  Pt and husband are in agreement to plan  06/08/15 Pt successfully extubated today.  CM will continue to monitor for disposition needs  06/07/15 Attending recommended LTAC referral due to pt being unable to wean off vent (day 8) at this time due to deconditioning and poor mental status - planned for trach 2/22.  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  CM consulted with Physician Advisor on appropriateness for LTAC - CM will continue to monitor.  06/05/15 CM received call from [name] for [facility] hired by [facility] to assist with [facility] needs at discharge, CM informed resource that pt remains on ventilator.    Pt is independent from home with husband [name], pt is a  Oncologist.  CM will continue to monitor for disposition needs   Expected Discharge Date:                  Expected Discharge Plan:  Home/Self Care  In-House Referral:     Discharge planning Services  CM Consult  Post Acute Care Choice:    Choice offered to:     DME Arranged:    DME Agency:     HH Arranged:    HH Agency:     Status of Service:  In process, will continue to follow  Medicare Important Message Given:    Date Medicare IM Given:    Medicare IM give by:    Date Additional Medicare IM Given:    Additional Medicare Important Message give by:     If discussed at [place] of Stay Meetings, dates discussed:    Additional Comments:  [name], RN 06/09/2015, 11:49 AM Case Management Note  Patient Details  Name: [name] [name] MRN: [id number] Date of Birth: June 24, 1955  Subjective/Objective:                     Action/Plan:   Expected Discharge Date:                  Expected Discharge Plan:  Home/Self Care  In-House Referral:     Discharge planning Services  CM Consult  Post Acute Care Choice:    Choice offered to:     DME Arranged:    DME Agency:     HH Arranged:    HH Agency:     Status of Service:  In process, will continue to follow  Medicare Important Message Given:    Date Medicare IM Given:    Medicare IM give by:    Date Additional Medicare IM Given:    Additional Medicare Important Message give by:     If discussed at [place] of Stay Meetings, dates discussed:    Additional Comments:  [name], RN 06/09/2015, 11:49 AM

## 2015-06-09 NOTE — Evaluation (Signed)
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 Pt wtih previous dx of flu 4 days prior to admission and started tamiflu. Ct (+) dense R infiltrated with volume loss  (+) PNA, group A strep bacteremia acute respiratory failure.  Pt intubated 05/29/15-06/08/15 PMH: sjogrens disease, hypothyroidism dysphagia seizures, adenopathy, scoliosis, spleen anomaly, anemnia, raynaud disease, osteopenia  Clinical Impression  Patient presents with right hemiplegia with RUE>RLE, decreased trunk control/balance, decreased endurance and elevated HR impacting safe mobility. Tolerated standing bouts x2 with assist of 2. Limited due to tachycardia and decrease in oxygen saturation. Highly motivated to return to PLOF. Would benefit from CIR to maximize independence and mobility prior to return home.    Follow Up Recommendations CIR    Equipment Recommendations  Other (comment) (TBD)    Recommendations for Other Services OT consult;Rehab consult     Precautions / Restrictions Precautions Precautions: Fall Precaution Comments: R hemiplegia Restrictions Weight Bearing Restrictions: No      Mobility  Bed Mobility               General bed mobility comments: Sitting in chair upon PT arrival.   Transfers Overall transfer level: Needs assistance Equipment used: 1 person hand held assist Transfers: Sit to/from Stand Sit to Stand: Mod assist;+2 physical assistance         General transfer comment: Stood from chair with Mod A of 2; manual cues for hip/trunk extension. Able to initiate back extensors on command but fatigues quickly. HR up to 135 in standind and Sp02 down to 87%. increased RR. Stood Gaffer.  Ambulation/Gait                Stairs            Wheelchair Mobility    Modified Rankin (Stroke Patients  Only) Modified Rankin (Stroke Patients Only) Pre-Morbid Rankin Score: No symptoms Modified Rankin: Severe disability     Balance Overall balance assessment: Needs assistance Sitting-balance support: Feet supported;No upper extremity supported Sitting balance-Leahy Scale: Fair Sitting balance - Comments: Able to initiate lumbar and thoracic extensors with verbal and Mod manual cues sitting in chair without support. Fatigues quickly. Decreased cervical extension. Worked on reciprocal scooting with LOB posteriorly. Max A.   Standing balance support: During functional activity Standing balance-Leahy Scale: Zero Standing balance comment: Assist of 2 for static standing balance working on facilitation of trunk and core musculature. Stood for ~40 sec, ~30 sec. Pt tachycardic.                             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 husband visits [place] to see patients frequently    Prior Function Level of Independence: Independent               Hand Dominance   Dominant Hand: Right    Extremity/Trunk Assessment   Upper Extremity Assessment:  Defer to OT evaluation           Lower Extremity Assessment: RLE deficits/detail RLE Deficits / Details: Grossly ~2+/5 hip flexion, 3/5 knee extension/flexion and 2+/5 DF.    Cervical / Trunk Assessment: Other exceptions (scoliosis)  Communication   Communication: No difficulties  Cognition Arousal/Alertness:  Awake/alert Behavior During Therapy: Flat affect Overall Cognitive Status: Impaired/Different from baseline Area of Impairment: Awareness           Awareness: Intellectual   General Comments: Fixated on "dry sensation in mouth.    General Comments General comments (skin integrity, edema, etc.): Reviewed positioning RUE.    Exercises General Exercises - Lower Extremity Long Arc Quad: Both;10 reps;Seated      Assessment/Plan    PT Assessment Patient needs continued PT services  PT Diagnosis Hemiplegia dominant side   PT Problem List Decreased strength;Cardiopulmonary status limiting activity;Decreased range of motion;Impaired sensation;Decreased activity tolerance;Decreased mobility;Decreased balance;Impaired tone  PT Treatment Interventions Balance training;Functional mobility training;Therapeutic activities;Therapeutic exercise;Patient/family education;Gait training;Neuromuscular re-education;DME instruction   PT Goals (Current goals can be found in the Care Plan section) Acute Rehab PT Goals Patient Stated Goal: to have something to drink PT Goal Formulation: With patient Time For Goal Achievement: 06/30/15 Potential to Achieve Goals: Good    Frequency Min 3X/week   Barriers to discharge        Co-evaluation               End of Session Equipment Utilized During Treatment: Gait belt;Oxygen Activity Tolerance: Patient tolerated treatment well Patient left: in chair;with call bell/phone within reach Nurse Communication: Mobility status;Need for lift equipment         Time: 1340-1400 PT Time Calculation (min) (ACUTE ONLY): 20 min   Charges:   PT Evaluation $PT Eval High Complexity: 1 Procedure     PT G Codes:        Anvay Tennis A Bader [place] 06/09/2015, 3:01 PM Wray Kearns, [place], DPT [phone]

## 2015-06-09 NOTE — Evaluation (Signed)
Clinical/Bedside Swallow Evaluation Patient Details  Name: [name] [name] MRN: [id number] Date of Birth: 04-06-1956  Today's Date: 06/09/2015 Time: SLP Start Time (ACUTE ONLY): 0840 SLP Stop Time (ACUTE ONLY): 0900 SLP Time Calculation (min) (ACUTE ONLY): 20 min  Past Medical History:  Past Medical History  Diagnosis Date  . Hypothyroidism   . Seizures ([place])   . Adenopathy   . Scoliosis   . Spleen anomaly   . Anemia   . Sjogren's disease ([place])   . Raynaud disease   . Shingles   . Osteopenia    Past Surgical History:  Past Surgical History  Procedure Laterality Date  . Tonsillectomy    . Breast cyst aspiration Right 1990's    neg   HPI:  [name] [name] is a 60 y/o woman with a history of Sjogren's disease, hypothyroidism and dysphagia. She was diagnosed with the flu (A) about 4 days PTA and started on tamiflu. 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  Intubated from 2/12 to 2/21.There is a question of esophageal stricture; pt was seen by GI at [facility], complained of globus with pills and some solids, occuring 2-3x a week. Was scheduled for an EGD at [facility], but appears to have cancelled the procedure. 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   Assessment / Plan / Recommendation Clinical Impression  Pt demonstrates multiple risk factors including prolonged intubation, history of dysphagia, admission with right lobe pna, Chronic dry mouth and CVA. At bedside pt demonstrates appropriate arousal, awareness, only mild dysphonia and no cough with PO trials. There are signs of abnormal swallow function however including decreased laryngeal elevation and multiple swallows. Objective study warranted for assessment of swallow. Will schedule MBS for today.     Aspiration Risk  Severe aspiration risk    Diet Recommendation NPO   Medication Administration:  Via alternative means    Other  Recommendations     Follow up Recommendations  Inpatient Rehab    Frequency and Duration            Prognosis        Swallow Study   General HPI: [name] [name] is a 60 y/o woman with a history of Sjogren's disease, hypothyroidism and dysphagia. She was diagnosed with the flu (A) about 4 days PTA and started on tamiflu. 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  Intubated from 2/12 to 2/21.There is a question of esophageal stricture; pt was seen by GI at [facility], complained of globus with pills and some solids, occuring 2-3x a week. Was scheduled for an EGD at [facility] but appears to have cancelled the procedure. 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 Type of Study: Bedside Swallow Evaluation Previous Swallow Assessment: none Diet Prior to this Study: NPO;NG Tube Temperature Spikes Noted: No Respiratory Status: Nasal cannula History of Recent Intubation: Yes Length of Intubations (days): 9 days Date extubated: 06/08/15 Behavior/Cognition: Alert;Cooperative Oral Cavity Assessment: Within Functional Limits Oral Care Completed by SLP: Yes Oral Cavity - Dentition: Adequate natural dentition Self-Feeding Abilities: Able to feed self;Needs assist Patient Positioning: Upright in chair Baseline Vocal Quality: Hoarse (mild) Volitional Cough: Strong Volitional Swallow: Able to elicit    Oral/Motor/Sensory Function Overall Oral Motor/Sensory Function: Within functional limits   Ice Chips  Thin Liquid Thin Liquid: Impaired Pharyngeal  Phase Impairments: Suspected delayed Swallow;Decreased hyoid-laryngeal movement;Multiple swallows    Nectar Thick Nectar Thick Liquid: Not tested   Honey Thick Honey Thick Liquid: Not tested   Puree Puree: Impaired Pharyngeal Phase Impairments: Multiple swallows;Decreased hyoid-laryngeal  movement;Suspected delayed Swallow   Solid   GO   Solid: Not tested       Herbie Baltimore, MA CCC-SLP [id number]  Isaac Dubie, Katherene Ponto 06/09/2015,10:59 AM

## 2015-06-10 ENCOUNTER — Inpatient Hospital Stay (HOSPITAL_COMMUNITY): Payer: BC Managed Care – PPO

## 2015-06-10 DIAGNOSIS — I639 Cerebral infarction, unspecified: Secondary | ICD-10-CM

## 2015-06-10 DIAGNOSIS — R1310 Dysphagia, unspecified: Secondary | ICD-10-CM

## 2015-06-10 DIAGNOSIS — G8191 Hemiplegia, unspecified affecting right dominant side: Secondary | ICD-10-CM

## 2015-06-10 LAB — COMPREHENSIVE METABOLIC PANEL
ALBUMIN: 1.7 g/dL — AB (ref 3.5–5.0)
ALK PHOS: 515 U/L — AB (ref 38–126)
ALT: 65 U/L — ABNORMAL HIGH (ref 14–54)
ANION GAP: 9 (ref 5–15)
AST: 85 U/L — AB (ref 15–41)
BILIRUBIN TOTAL: 0.2 mg/dL — AB (ref 0.3–1.2)
BUN: 30 mg/dL — AB (ref 6–20)
CALCIUM: 8.3 mg/dL — AB (ref 8.9–10.3)
CO2: 23 mmol/L (ref 22–32)
Chloride: 104 mmol/L (ref 101–111)
Creatinine, Ser: 0.76 mg/dL (ref 0.44–1.00)
GFR calc Af Amer: >60 mL/min (ref >60)
GFR calc non Af Amer: >60 mL/min (ref >60)
GLUCOSE: 138 mg/dL — AB (ref 65–99)
Potassium: 4.4 mmol/L (ref 3.5–5.1)
SODIUM: 136 mmol/L (ref 135–145)
Total Protein: 6.3 g/dL — ABNORMAL LOW (ref 6.5–8.1)

## 2015-06-10 LAB — GLUCOSE, CAPILLARY
GLUCOSE-CAPILLARY: 144 mg/dL — AB (ref 65–99)
Glucose-Capillary: 109 mg/dL — ABNORMAL HIGH (ref 65–99)
Glucose-Capillary: 120 mg/dL — ABNORMAL HIGH (ref 65–99)
Glucose-Capillary: 127 mg/dL — ABNORMAL HIGH (ref 65–99)
Glucose-Capillary: 149 mg/dL — ABNORMAL HIGH (ref 65–99)
Glucose-Capillary: 95 mg/dL (ref 65–99)

## 2015-06-10 LAB — HEPARIN LEVEL (UNFRACTIONATED)
HEPARIN UNFRACTIONATED: 0.46 [IU]/mL (ref 0.30–0.70)
Heparin Unfractionated: 0.11 IU/mL — ABNORMAL LOW (ref 0.30–0.70)
Heparin Unfractionated: 0.41 IU/mL (ref 0.30–0.70)

## 2015-06-10 LAB — CBC
HEMATOCRIT: 30.6 % — AB (ref 36.0–46.0)
HEMOGLOBIN: 9.2 g/dL — AB (ref 12.0–15.0)
MCH: 28.2 pg (ref 26.0–34.0)
MCHC: 30.1 g/dL (ref 30.0–36.0)
MCV: 93.9 fL (ref 78.0–100.0)
Platelets: 578 10*3/uL — ABNORMAL HIGH (ref 150–400)
RBC: 3.26 MIL/uL — AB (ref 3.87–5.11)
RDW: 20.6 % — AB (ref 11.5–15.5)
WBC: 20.6 10*3/uL — AB (ref 4.0–10.5)

## 2015-06-10 MED ORDER — SODIUM CHLORIDE 0.9% FLUSH
10.0000 mL | INTRAVENOUS | Status: DC | PRN
Start: 2015-06-10 — End: 2015-06-16

## 2015-06-10 MED ORDER — SODIUM CHLORIDE 0.9% FLUSH
10.0000 mL | Freq: Two times a day (BID) | INTRAVENOUS | Status: DC
  Administered 2015-06-10 – 2015-06-13 (×6): 10 mL

## 2015-06-10 NOTE — Progress Notes (Signed)
ANTICOAGULATION CONSULT NOTE - Follow Up Consult  Pharmacy Consult for heparin Indication: aflutter/stroke  Allergies  Allergen Reactions  . Erythromycin Ethylsuccinate Hives  . Keflex [Cephalexin] Hives  . Penicillins   . Sulfa Antibiotics     Patient Measurements: Height: 5\' 2"  (157.5 cm) Weight: 135 lb 2.3 oz (61.3 kg) IBW/kg (Calculated) : 50.1 Heparin Dosing Weight: 65 kg  Vital Signs: Temp: 98.1 F (36.7 C) (02/23 0339) Temp Source: Oral (02/23 0339) BP: 127/66 mmHg (02/23 0200) Pulse Rate: 87 (02/23 0000)  Labs:  Recent Labs  06/08/15 0359  06/08/15 1052 06/09/15 0245 06/09/15 0340 06/10/15 0400  HGB 9.3*  --   --   --  9.5* 9.2*  HCT 30.9*  --   --   --  32.0* 30.6*  PLT 401*  --   --   --  479* 578*  HEPARINUNFRC  --   < > 0.36 0.37  --  0.11*  CREATININE 0.78  --   --   --  0.83 0.76  < > = values in this interval not displayed.  Estimated Creatinine Clearance: 62.8 mL/min (by C-G formula based on Cr of 0.76).   Assessment: 60 yo f on heparin infusion for aflutter and CCA thrombus. 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 Pt has been having hematuria for 3 days now - so aim towards lower end of goal per CCM. Heparin level now down to 0.11 (subtherapeutic) on 950 units/hr - had been therapeutic x 3 on this rate. No issues with line reported per RN. Hematuria continues but no worsening.  Goal of Therapy:  Heparin level 0.3-0.5 units/ml Monitor platelets by anticoagulation protocol: Yes   Plan:  - Increase heparin infusion to 1100 units/hr - F/u 6 hr heparin level - Monitor hematuria and other s/s of bleeding  Sherlon Handing, PharmD, BCPS Clinical pharmacist, pager [phone] 06/10/2015 5:17 AM

## 2015-06-10 NOTE — Progress Notes (Signed)
Peripherally Inserted Central Catheter/Midline Placement  The IV Nurse has discussed with the patient and/or persons authorized to consent for the patient, the purpose of this procedure and the potential benefits and risks involved with this procedure.  The benefits include less needle sticks, lab draws from the catheter and patient may be discharged home with the catheter.  Risks include, but not limited to, infection, bleeding, blood clot (thrombus formation), and puncture of an artery; nerve damage and irregular heat beat.  Alternatives to this procedure were also discussed.  PICC/Midline Placement Documentation        [name] [name] 06/10/2015, 4:27 PM

## 2015-06-10 NOTE — Progress Notes (Signed)
Patient ID: [name] [name], female   DOB: March 04, 1956, 60 y.o.   MRN: [id number]   Aware of request for percutaneous gastric tube placement  CVA; dysphagia Swallow study Rec: npo  afeb Bacteremia (BC neg 2/15) Wbc 20.6- trending down  Discussed with Dr Delfina Redwood for now Re evaluate next week  IR will keep pt on radar

## 2015-06-10 NOTE — Progress Notes (Signed)
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 Pt wtih previous dx of flu 4 days prior to admission and started tamiflu. Ct (+) dense R infiltrated with volume loss  (+) PNA, group A strep bacteremia acute respiratory failure.  Pt intubated 05/29/15-06/08/15 PMH: sjogrens disease, hypothyroidism dysphagia seizures, adenopathy, scoliosis, spleen anomaly, anemnia, raynaud disease, osteopenia    PT Comments    Patient progressing well towards PT goals. Better able to initiate and maintain trunk activation today with verbal cues and Min manual cues. Able to tolerate dynamic sitting balance without support. Encouraged cervical AROM as pt with palpable tight trigger points in trapezius muscles on right. Encouraged use of RUE/LE and sitting in chair as long as tolerated. Highly motivated. Will continue to follow.   Follow Up Recommendations  CIR     Equipment Recommendations  Other (comment)    Recommendations for Other Services       Precautions / Restrictions Precautions Precautions: Fall Precaution Comments: R hemiplegia Restrictions Weight Bearing Restrictions: No    Mobility  Bed Mobility Overal bed mobility: Needs Assistance Bed Mobility: Supine to Sit     Supine to sit: Mod assist     General bed mobility comments: Assist with elevating trunk and to scoot bottom to EOB. Able to use UE to move RLE to EOB. Increased time.  Transfers Overall transfer level: Needs assistance Equipment used: 2 person hand held assist Transfers: Sit to/from [facility] Sit to Stand: Mod assist Stand pivot transfers: Mod assist;+2 physical assistance       General transfer comment: Stood from EOB x1, from chair x1 with cues for anterior translation; manual cues for upright posture and hip/trunk  extension. Able to initiate upright with verbal cues today but fatigues quickly. SPT bed to chair with MOd A of 2. Increased RR and Sp02 dropped to high 80s on RA.  Ambulation/Gait                 Stairs            Wheelchair Mobility    Modified Rankin (Stroke Patients Only) Modified Rankin (Stroke Patients Only) Pre-Morbid Rankin Score: No symptoms Modified Rankin: Severe disability     Balance Overall balance assessment: Needs assistance Sitting-balance support: Feet supported;No upper extremity supported Sitting balance-Leahy Scale: Fair Sitting balance - Comments: Able to sit unsupported without UE support with manual and verbal cues for lumbar andthoracic extension. Able to maintain for 1-2 mins and then fatigues.    Standing balance support: During functional activity Standing balance-Leahy Scale: Zero Standing balance comment: Requires assist of 1-2 for static standing balance working on facilitation of trunk and core musculature and upright posture.                     Cognition Arousal/Alertness: Awake/alert Behavior During Therapy: Flat affect Overall Cognitive Status: Within Functional Limits for tasks assessed                      Exercises General Exercises - Lower Extremity Ankle Circles/Pumps: Both;10 reps;Supine Quad Sets: Both;10 reps;Supine Long Arc Quad: Both;10 reps;Seated    General Comments        Pertinent Vitals/Pain Pain Assessment: No/denies pain    Home Living  Prior Function            PT Goals (current goals can now be found in the care plan section) Progress towards PT goals: Progressing toward goals    Frequency  Min 3X/week    PT Plan Current plan remains appropriate    Co-evaluation             End of Session Equipment Utilized During Treatment: Gait belt;Oxygen Activity Tolerance: Patient tolerated treatment well Patient left: in chair;with call bell/phone  within reach;with family/visitor present     Time: 1016-1050 PT Time Calculation (min) (ACUTE ONLY): 34 min  Charges:  $Therapeutic Activity: 8-22 mins $Neuromuscular Re-education: 8-22 mins                    G Codes:      Mady Oubre A Mahealani Sulak 06/10/2015, 12:43 PM  Wray Kearns, [place], DPT [phone]

## 2015-06-10 NOTE — Progress Notes (Signed)
ANTICOAGULATION CONSULT NOTE - Follow Up Consult  Pharmacy Consult for heparin Indication: aflutter/stroke  Allergies  Allergen Reactions  . Erythromycin Ethylsuccinate Hives  . Keflex [Cephalexin] Hives  . Penicillins   . Sulfa Antibiotics     Patient Measurements: Height: 5\' 2"  (157.5 cm) Weight: 135 lb 2.3 oz (61.3 kg) IBW/kg (Calculated) : 50.1 Heparin Dosing Weight: 65 kg  Vital Signs: Temp: 98.3 F (36.8 C) (02/23 2005) Temp Source: Oral (02/23 2005) BP: 107/52 mmHg (02/23 2000) Pulse Rate: 91 (02/23 2000)  Labs:  Recent Labs  06/08/15 0359  06/09/15 0340 06/10/15 0400 06/10/15 1159 06/10/15 2100  HGB 9.3*  --  9.5* 9.2*  --   --   HCT 30.9*  --  32.0* 30.6*  --   --   PLT 401*  --  479* 578*  --   --   HEPARINUNFRC  --   < >  --  0.11* 0.46 0.41  CREATININE 0.78  --  0.83 0.76  --   --   < > = values in this interval not displayed.  Estimated Creatinine Clearance: 62.8 mL/min (by C-G formula based on Cr of 0.76).   Assessment: 60 yo f on heparin infusion for aflutter and CCA thrombus. 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 Pt has been having hematuria for ~5 days now - so aim towards lower end of goal per CCM. Hematuria a little worse this AM 2/23 per RN. HL is therapeutic at 0.41 after rate decreased to 1050 units/hr.   Goal of Therapy:  Heparin level 0.3-0.5 units/ml Monitor platelets by anticoagulation protocol: Yes   Plan:  - continue heparin infusion at 1050 units/hr -daily HL/CBC - Monitor hematuria and other s/s of bleeding  Eudelia Bunch, Pharm.D. BP:[phone] 06/10/2015 9:37 PM

## 2015-06-10 NOTE — Consult Note (Signed)
Physical Medicine and Rehabilitation Consult Reason for Consult: Acute left frontal lobe infarct/Acute respiratory failure  Referring Physician: [facility]   HPI: [name] [name] is a 60 y.o.right handed  female with history of Sjogrens disease/rheumatoid arthritis, noted history of seizure but not on antiepileptic meds and dysphagia. Patient diagnosed 4 days prior with an placed on Tamiflu. Lives with spouse independent prior to admission. 2 level home with bedroom on main level. 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 Presented 05/29/2015 with right side weakness and gait instability. Patient with severe restrictive airway distress when he presented to the ED with oxygen saturations in the 70s requiring nonrebreathing mask and later intubated with slow extubation follow-up per critical care pulmonary services. Chest x-ray showed large right lung consolidative changes. CT angiogram chest no evidence of pulmonary emboli. Cranial CT scan negative. 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 Echocardiogram with ejection fraction of 65% no wall motion abnormalities. 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 EEG was negative for seizure. Patient did not receive TPA. Blood cultures showed group A strep pansensitive 05/30/2015 and currently maintained on Rocephin with droplet contact precautions. Await plan for possible TEE. Hospital course atrial flutter currently maintained on intravenous heparin. Plavix recommended for CVA however on hold as patient may need PEG tube and remains nothing by mouth. Physical therapy evaluation completed 06/09/2015 with recommendations of physical medicine rehabilitation consult.   Review of Systems  Constitutional: Positive for chills. Negative for fever.  HENT: Negative for hearing loss.   Eyes: Negative for blurred vision and double vision.  Respiratory: Positive for cough and shortness of  breath.   Cardiovascular: Positive for palpitations and leg swelling. Negative for chest pain.  Gastrointestinal: Positive for constipation. Negative for nausea and vomiting.  Genitourinary: Negative for dysuria and hematuria.  Musculoskeletal: Positive for myalgias and joint pain.  Skin: Positive for rash.  Neurological: Positive for dizziness and weakness. Negative for loss of consciousness and headaches.  All other systems reviewed and are negative.  Past Medical History  Diagnosis Date  . Hypothyroidism   . Seizures ([place])   . Adenopathy   . Scoliosis   . Spleen anomaly   . Anemia   . Sjogren's disease ([place])   . Raynaud disease   . Shingles   . Osteopenia    Past Surgical History  Procedure Laterality Date  . Tonsillectomy    . Breast cyst aspiration Right 1990's    neg   Family History  Problem Relation Age of Onset  . Breast cancer Maternal Grandfather 58   Social History:  reports that she has never smoked. She has never used smokeless tobacco. She reports that she does not drink alcohol or use illicit drugs. Allergies:  Allergies  Allergen Reactions  . Erythromycin Ethylsuccinate Hives  . Keflex [Cephalexin] Hives  . Penicillins   . Sulfa Antibiotics    Medications Prior to Admission  Medication Sig Dispense Refill  . cetirizine (ZYRTEC) 10 MG tablet Take 10 mg by mouth daily.    Marland Kitchen conjugated estrogens (PREMARIN) vaginal cream Place 1 Applicatorful vaginally 2 (two) times a week.    . fluticasone (FLONASE) 50 MCG/ACT nasal spray Place 2 sprays into both nostrils daily.    . [EXPIRED] HYDROcodone-homatropine (HYCODAN) 5-1.5 MG/5ML syrup Take 1 mL by mouth every 6 (six) hours as needed.    . hydroxychloroquine (PLAQUENIL) 200 MG tablet Take by mouth  daily.    . levothyroxine (SYNTHROID, LEVOTHROID) 112 MCG tablet Take 112 mcg by mouth daily before breakfast.    . Multiple Vitamin (MULTIVITAMIN) tablet Take 1 tablet by mouth daily.    . [EXPIRED] ondansetron  (ZOFRAN-ODT) 4 MG disintegrating tablet Take 1 tablet by mouth every 8 (eight) hours as needed.    . [EXPIRED] oseltamivir (TAMIFLU) 75 MG capsule Take 1 capsule by mouth 2 (two) times daily.    . predniSONE (DELTASONE) 10 MG tablet Take 10 mg by mouth daily with breakfast.    . predniSONE (DELTASONE) 20 MG tablet Take 1 tablet by mouth daily.    . tri-vitamin w/ fluoride (TRI-VI-SOL) 0.25 MG/ML solution Take 0.25 mg by mouth daily.    . vitamin B-12 (CYANOCOBALAMIN) 1000 MCG tablet Take 1,000 mcg by mouth daily.      Home: Home Living Family/patient expects to be discharged to:: Private residence Living Arrangements: Spouse/significant other Available Help at Discharge: Family Type of Home: House Home Access: Stairs to enter Technical brewer of Steps: 3 Home Layout: Two level, Able to live on main level with bedroom/bathroom, Full bath on main level Bathroom Shower/Tub: Gaffer, Chiropodist: Standard Home Equipment: Civil engineer, contracting, Civil engineer, contracting - built in Additional Comments: spouse works as a Research scientist (physical sciences) and patient is a Oncologist. husband visits [place] to see patients frequently  Functional History: Prior Function Level of Independence: Independent Functional Status:  Mobility: Bed Mobility Overal bed mobility: Needs Assistance Bed Mobility: Supine to Sit, Sit to Supine Supine to sit: Mod assist Sit to supine: Max assist General bed mobility comments: Sitting in chair upon PT arrival.  Transfers Overall transfer level: Needs assistance Equipment used: 1 person hand held assist Transfers: Sit to/from Stand Sit to Stand: Mod assist, +2 physical assistance General transfer comment: Stood from chair with Mod A of 2; manual cues for hip/trunk extension. Able to initiate back extensors on command but fatigues quickly. HR up to 135 in standind and Sp02 down to 87%. increased RR. Stood Gaffer.      ADL: ADL Overall ADL's : Needs  assistance/impaired Eating/Feeding: NPO Grooming: Wash/dry face, Minimal assistance, Bed level (L UE) Lower Body Dressing: Maximal assistance, Bed level Lower Body Dressing Details (indicate cue type and reason): OT don sock over toes and pt able with (A) to pull sock over bil feet General ADL Comments: Pt tolerated EOB sitting for ~ 5 minutes. pt attempting to move R Ue and problem solve balance at EOB. pt encouraged to keep attempting to move R UE. pt with incr effort and attempts demonstrates movement. PT fixated on "dry" sensation from NPO status and pending MBS  Cognition: Cognition Overall Cognitive Status: Impaired/Different from baseline Orientation Level: Oriented to person, Oriented to place, Disoriented to time, Disoriented to situation Cognition Arousal/Alertness: Awake/alert Behavior During Therapy: Flat affect Overall Cognitive Status: Impaired/Different from baseline Area of Impairment: Awareness Orientation Level: Disoriented to, Place, Time, Situation Awareness: Intellectual General Comments: Fixated on "dry sensation in mouth.  Blood pressure 127/66, pulse 87, temperature 98.1 F (36.7 C), temperature source Oral, resp. rate 24, height 5\' 2"  (1.575 m), weight 61.3 kg (135 lb 2.3 oz), SpO2 93 %. Physical Exam  Constitutional: She is oriented to person, place, and time. She appears well-developed.  HENT:  Head: Normocephalic.  NG tube in place.  Eyes: EOM are normal.  Neck: Normal range of motion. Neck supple. No thyromegaly present.  Cardiovascular:  Cardiac rate controlled  Respiratory: Effort normal and breath sounds  normal.  GI: Soft. Bowel sounds are normal. She exhibits no distension.  Neurological: She is oriented to person, place, and time.  Alert .Follows simple commands. Answers biographical questions. Right inattention. MMT: 1-2 (inconsistent) RUE and tr to 1 RLE. Moves left side fairly freely. Senses touch in both right and left limbs.   Skin: Skin is  warm and dry.  Psychiatric:  Affect flat but is cooperative    Results for orders placed or performed during the hospital encounter of 05/29/15 (from the past 24 hour(s))  Glucose, capillary     Status: Abnormal   Collection Time: 06/09/15  8:11 AM  Result Value Ref Range   Glucose-Capillary 156 (H) 65 - 99 mg/dL  Glucose, capillary     Status: Abnormal   Collection Time: 06/09/15 12:03 PM  Result Value Ref Range   Glucose-Capillary 109 (H) 65 - 99 mg/dL  Glucose, capillary     Status: Abnormal   Collection Time: 06/09/15  3:38 PM  Result Value Ref Range   Glucose-Capillary 149 (H) 65 - 99 mg/dL  Glucose, capillary     Status: None   Collection Time: 06/09/15  8:12 PM  Result Value Ref Range   Glucose-Capillary 76 65 - 99 mg/dL  Glucose, capillary     Status: Abnormal   Collection Time: 06/09/15 11:56 PM  Result Value Ref Range   Glucose-Capillary 120 (H) 65 - 99 mg/dL  Glucose, capillary     Status: Abnormal   Collection Time: 06/10/15  3:38 AM  Result Value Ref Range   Glucose-Capillary 127 (H) 65 - 99 mg/dL  CBC     Status: Abnormal   Collection Time: 06/10/15  4:00 AM  Result Value Ref Range   WBC 20.6 (H) 4.0 - 10.5 K/uL   RBC 3.26 (L) 3.87 - 5.11 MIL/uL   Hemoglobin 9.2 (L) 12.0 - 15.0 g/dL   HCT 30.6 (L) 36.0 - 46.0 %   MCV 93.9 78.0 - 100.0 fL   MCH 28.2 26.0 - 34.0 pg   MCHC 30.1 30.0 - 36.0 g/dL   RDW 20.6 (H) 11.5 - 15.5 %   Platelets 578 (H) 150 - 400 K/uL  Heparin level (unfractionated)     Status: Abnormal   Collection Time: 06/10/15  4:00 AM  Result Value Ref Range   Heparin Unfractionated 0.11 (L) 0.30 - 0.70 IU/mL  Comprehensive metabolic panel     Status: Abnormal   Collection Time: 06/10/15  4:00 AM  Result Value Ref Range   Sodium 136 135 - 145 mmol/L   Potassium 4.4 3.5 - 5.1 mmol/L   Chloride 104 101 - 111 mmol/L   CO2 23 22 - 32 mmol/L   Glucose, Bld 138 (H) 65 - 99 mg/dL   BUN 30 (H) 6 - 20 mg/dL   Creatinine, Ser 0.76 0.44 - 1.00 mg/dL    Calcium 8.3 (L) 8.9 - 10.3 mg/dL   Total Protein 6.3 (L) 6.5 - 8.1 g/dL   Albumin 1.7 (L) 3.5 - 5.0 g/dL   AST 85 (H) 15 - 41 U/L   ALT 65 (H) 14 - 54 U/L   Alkaline Phosphatase 515 (H) 38 - 126 U/L   Total Bilirubin 0.2 (L) 0.3 - 1.2 mg/dL   GFR calc non Af Amer >60 >60 mL/min   GFR calc Af Amer >60 >60 mL/min   Anion gap 9 5 - 15   Dg Chest Port 1 View  06/08/2015  CLINICAL DATA:  Respiratory failure EXAM: PORTABLE  CHEST 1 VIEW COMPARISON:  06/07/2015 FINDINGS: Cardiac shadow is stable. A feeding catheter is again identified. Patchy infiltrate is noted throughout the right lung stable from the prior exam. Some slight increased density is now seen in the left base which may represent some early infiltrate as well. No other focal abnormality is noted. IMPRESSION: Stable right sided pneumonia. New left basilar infiltrative density. Electronically Signed   By: Inez Catalina M.D.   On: 06/08/2015 10:11   Dg Swallowing Func-speech Pathology  06/09/2015  Objective Swallowing Evaluation: Type of Study: MBS-Modified Barium Swallow Study Patient Details Name: [name] [name] MRN: [id number] Date of Birth: [birth year] Today's Date: 06/09/2015 Time: SLP Start Time (ACUTE ONLY): 1010-SLP Stop Time (ACUTE ONLY): 1035 SLP Time Calculation (min) (ACUTE ONLY): 25 min Past Medical History: Past Medical History Diagnosis Date . Hypothyroidism  . Seizures ([place])  . Adenopathy  . Scoliosis  . Spleen anomaly  . Anemia  . Sjogren's disease ([place])  . Raynaud disease  . Shingles  . Osteopenia  Past Surgical History: Past Surgical History Procedure Laterality Date . Tonsillectomy   . Breast cyst aspiration Right 1990's   neg HPI: Ms/ Disabato is a 60 y/o woman with a history of Sjogren's disease, hypothyroidism and dysphagia. She was diagnosed with the flu (A) about 4 days PTA and started on tamiflu. 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  Intubated from 2/12 to 2/21.There is a question of esophageal stricture; pt was seen by GI at [facility], complained of globus with pills and some solids, occuring 2-3x a week. Was scheduled for an EGD at [facility], but appears to have cancelled the procedure. 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 No Data Recorded Assessment / Plan / Recommendation CHL IP CLINICAL IMPRESSIONS 06/09/2015 Therapy Diagnosis Moderate cervical esophageal phase dysphagia;Severe pharyngeal phase dysphagia Clinical Impression Pt presents with a severe multifactoral dysphagia with sensory and motor deficits resulting in gross silent aspiration of thin liquids and penetration of nectar. Swallow is delayed, and laryngeal closure is incomplete, likely a result of prolonged intubation. More significant is noteably decreased opening of UES resulting is severe residuals with puree and thickened liquids. There is decreased hyolaryngeal excursion and poor pharyngeal constriction for bolus propulsion. 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 This type of deficit is often seen with Sjogren's disease and would could have played a role in occult aspiration contributing to pna. Of course, pt is severely deconditioned at this time, has a new CVA, has an NG tube impeding movement of structures and was intubated for 9 days, all of which play a role in pts dysphagia. Recommend pt remain NPO with short term alternate means of nutrition with close monitoring for progress. Will need repeat MBS to determine readiness for PO.  Impact on safety and function Severe aspiration risk   CHL IP TREATMENT RECOMMENDATION 06/09/2015 Treatment Recommendations F/U MBS in --- days (Comment)   Prognosis 06/09/2015 Prognosis for Safe Diet Advancement Fair Barriers to Reach  Goals Severity of deficits Barriers/Prognosis Comment -- CHL IP DIET RECOMMENDATION 06/09/2015 SLP Diet Recommendations NPO;Alternative means - temporary Liquid Administration via -- Medication Administration Via alternative means Compensations -- Postural Changes --   CHL IP OTHER RECOMMENDATIONS 06/09/2015 Recommended Consults -- Oral  Care Recommendations Oral care QID Other Recommendations --   CHL IP FOLLOW UP RECOMMENDATIONS 06/09/2015 Follow up Recommendations Inpatient Rehab   CHL IP FREQUENCY AND DURATION 06/09/2015 Speech Therapy Frequency (ACUTE ONLY) min 3x week Treatment Duration 2 weeks      CHL IP ORAL PHASE 06/09/2015 Oral Phase WFL Oral - Pudding Teaspoon -- Oral - Pudding Cup -- Oral - Honey Teaspoon -- Oral - Honey Cup -- Oral - Nectar Teaspoon -- Oral - Nectar Cup -- Oral - Nectar Straw -- Oral - Thin Teaspoon -- Oral - Thin Cup -- Oral - Thin Straw -- Oral - Puree -- Oral - Mech Soft -- Oral - Regular -- Oral - Multi-Consistency -- Oral - Pill -- Oral Phase - Comment --  CHL IP PHARYNGEAL PHASE 06/09/2015 Pharyngeal Phase Impaired Pharyngeal- Pudding Teaspoon -- Pharyngeal -- Pharyngeal- Pudding Cup -- Pharyngeal -- Pharyngeal- Honey Teaspoon Delayed swallow initiation-vallecula;Reduced epiglottic inversion;Reduced pharyngeal peristalsis;Reduced anterior laryngeal mobility;Reduced airway/laryngeal closure;Pharyngeal residue - valleculae;Pharyngeal residue - pyriform Pharyngeal -- Pharyngeal- Honey Cup -- Pharyngeal -- Pharyngeal- Nectar Teaspoon Delayed swallow initiation-vallecula;Reduced epiglottic inversion;Reduced pharyngeal peristalsis;Reduced anterior laryngeal mobility;Reduced airway/laryngeal closure;Pharyngeal residue - valleculae;Pharyngeal residue - pyriform;Penetration/Apiration after swallow;Penetration/Aspiration during swallow;Penetration/Aspiration before swallow Pharyngeal Material enters airway, passes BELOW cords without attempt by patient to eject out (silent aspiration);Material  enters airway, CONTACTS cords and not ejected out Pharyngeal- Nectar Cup -- Pharyngeal -- Pharyngeal- Nectar Straw -- Pharyngeal -- Pharyngeal- Thin Teaspoon -- Pharyngeal -- Pharyngeal- Thin Cup Delayed swallow initiation-vallecula;Reduced epiglottic inversion;Reduced pharyngeal peristalsis;Reduced anterior laryngeal mobility;Reduced airway/laryngeal closure;Pharyngeal residue - valleculae;Pharyngeal residue - pyriform;Penetration/Aspiration before swallow;Penetration/Aspiration during swallow;Penetration/Apiration after swallow;Significant aspiration (Amount) Pharyngeal Material enters airway, passes BELOW cords without attempt by patient to eject out (silent aspiration) Pharyngeal- Thin Straw -- Pharyngeal -- Pharyngeal- Puree Delayed swallow initiation-vallecula;Reduced epiglottic inversion;Reduced pharyngeal peristalsis;Reduced anterior laryngeal mobility;Reduced airway/laryngeal closure;Pharyngeal residue - valleculae;Pharyngeal residue - pyriform Pharyngeal -- Pharyngeal- Mechanical Soft -- Pharyngeal -- Pharyngeal- Regular -- Pharyngeal -- Pharyngeal- Multi-consistency -- Pharyngeal -- Pharyngeal- Pill -- Pharyngeal -- Pharyngeal Comment --  CHL IP CERVICAL ESOPHAGEAL PHASE 06/09/2015 Cervical Esophageal Phase Impaired Pudding Teaspoon -- Pudding Cup -- Honey Teaspoon -- Honey Cup -- Nectar Teaspoon -- Nectar Cup -- Nectar Straw -- Thin Teaspoon -- Thin Cup -- Thin Straw -- Puree -- Mechanical Soft -- Regular -- Multi-consistency -- Pill -- Cervical Esophageal Comment see impression No flowsheet data found. DeBlois, Katherene Ponto 06/09/2015, 11:25 AM              US Abdomen Limited Ruq  06/09/2015  CLINICAL DATA:  Elevated LFTs EXAM: US ABDOMEN LIMITED - RIGHT UPPER QUADRANT COMPARISON:  None. FINDINGS: Gallbladder: Gallstones are identified. No wall thickening or pericholecystic fluid is noted. Negative sonographic Percell Miller sign is noted. Common bile duct: Diameter: 1.9 mm. Liver: No focal lesion  identified. Within normal limits in parenchymal echogenicity. Incidental note is made of a small right-sided pleural effusion. IMPRESSION: Cholelithiasis. Right pleural effusion. Electronically Signed   By: Inez Catalina M.D.   On: 06/09/2015 21:50    Assessment/Plan: Diagnosis: left frontal infarct 1. Does the need for close, 24 hr/day medical supervision in concert with the patient's rehab needs make it unreasonable for this patient to be served in a less intensive setting? Yes 2. Co-Morbidities requiring supervision/potential complications: pneumonia, dysphagia 3. Due to bladder management, bowel management, safety, skin/wound care, disease management, medication administration, pain management and patient education, does the patient require 24 hr/day rehab nursing? Yes 4. Does the patient require coordinated care of a  physician, rehab nurse, PT (1-2 hrs/day, 5 days/week), OT (1-2 hrs/day, 5 days/week) and SLP (1-2 hrs/day, 5 days/week) to address physical and functional deficits in the context of the above medical diagnosis(es)? Yes Addressing deficits in the following areas: balance, endurance, locomotion, strength, transferring, bowel/bladder control, bathing, dressing, feeding, grooming, toileting, cognition, swallowing and psychosocial support 5. Can the patient actively participate in an intensive therapy program of at least 3 hrs of therapy per day at least 5 days per week? Yes 6. The potential for patient to make measurable gains while on inpatient rehab is excellent 7. Anticipated functional outcomes upon discharge from inpatient rehab are supervision and min assist  with PT, supervision and min assist with OT, supervision and min assist with SLP. 8. Estimated rehab length of stay to reach the above functional goals is: 18-27 days 9. Does the patient have adequate social supports and living environment to accommodate these discharge functional goals? Yes 10. Anticipated D/C setting:  Home 11. Anticipated post D/C treatments: HH therapy and Outpatient therapy 12. Overall Rehab/Functional Prognosis: excellent  RECOMMENDATIONS: This patient's condition is appropriate for continued rehabilitative care in the following setting: CIR Patient has agreed to participate in recommended program. Yes Note that insurance prior authorization may be required for reimbursement for recommended care.  Comment: Rehab Admissions Coordinator to follow up.  Thanks,  Meredith Staggers, MD, Mellody Drown     06/10/2015

## 2015-06-10 NOTE — Progress Notes (Signed)
ANTICOAGULATION CONSULT NOTE - Follow Up Consult  Pharmacy Consult for heparin Indication: aflutter/stroke  Allergies  Allergen Reactions  . Erythromycin Ethylsuccinate Hives  . Keflex [Cephalexin] Hives  . Penicillins   . Sulfa Antibiotics     Patient Measurements: Height: 5\' 2"  (157.5 cm) Weight: 135 lb 2.3 oz (61.3 kg) IBW/kg (Calculated) : 50.1 Heparin Dosing Weight: 65 kg  Vital Signs: Temp: 97.8 F (36.6 C) (02/23 1205) Temp Source: Oral (02/23 1205) BP: 108/67 mmHg (02/23 1200) Pulse Rate: 82 (02/23 1200)  Labs:  Recent Labs  06/08/15 0359  06/09/15 0245 06/09/15 0340 06/10/15 0400 06/10/15 1159  HGB 9.3*  --   --  9.5* 9.2*  --   HCT 30.9*  --   --  32.0* 30.6*  --   PLT 401*  --   --  479* 578*  --   HEPARINUNFRC  --   < > 0.37  --  0.11* 0.46  CREATININE 0.78  --   --  0.83 0.76  --   < > = values in this interval not displayed.  Estimated Creatinine Clearance: 62.8 mL/min (by C-G formula based on Cr of 0.76).   Assessment: 60 yo f on heparin infusion for aflutter and CCA thrombus. 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 Pt has been having hematuria for ~5 days now - so aim towards lower end of goal per CCM. Hematuria a little worse this AM 2/23 per RN. HL was low this AM so turned up to 1100 units/hr, repeat HL this afternoon is 0.46 - since having more hematuria and the goal is lower end of 0.3-0.5, will decrease slightly. Hgb stable ~9, plts high at 578.   Goal of Therapy:  Heparin level 0.3-0.5 units/ml Monitor platelets by anticoagulation protocol: Yes   Plan:  - Decrease heparin infusion to 1050 units/hr - F/u 6-hr HL @ 1900 - Monitor hematuria and other s/s of bleeding  Cassie L. Nicole Kindred, PharmD PGY2 Infectious Diseases Pharmacy Resident Pager: [phone] 06/10/2015 1:02 PM

## 2015-06-10 NOTE — Progress Notes (Signed)
Inpatient Rehabilitation  Met with patient at bedside to discuss team's recommendation for IP Rehab.  Patient and husband in agreement with plan and I initiated insurance authorization.  Will follow along for medical readiness.  Please call with questions.  Carmelia Roller., CCC/SLP Admission Coordinator  [place]  Cell [phone]

## 2015-06-10 NOTE — Progress Notes (Addendum)
Speech Language Pathology Treatment: Dysphagia  Patient Details Name: [name] [name] MRN: [id number] DOB: 17-Dec-1955 Today's Date: 06/10/2015 Time: [id number]  SLP Time Calculation (min) (ACUTE ONLY): 20 min  Assessment / Plan / Recommendation Clinical Impression  Pt demonstrates adequate arousal and ability to follow complex commands. Provided summary of swallow testing to pt and husband and therapy plan for the next week. Pt completed 10 effortful swallows with moderate verbal cues. Also attempted instruction with mendelsohn maneuver with max verbal instruction with tactile biofeedback. Pt struggled with this strategy, but shows potential. This could be trialed as a compensatory strategy in a f/u MBS next week. Recommend pt consume 5 ice chips after oral care with effortful swallows throughout the day as tolerated. Discussed with RN and Pt/family.    HPI HPI: Ms/ [name] is a 60 y/o woman with a history of Sjogren's disease, hypothyroidism and dysphagia. She was diagnosed with the flu (A) about 4 days PTA and started on tamiflu. 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  Intubated from 2/12 to 2/21.There is a question of esophageal stricture; pt was seen by GI at [facility], complained of globus with pills and some solids, occuring 2-3x a week. Was scheduled for an EGD at [facility], but appears to have cancelled the procedure. 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      SLP Plan  Continue with current plan of care     Recommendations  Diet recommendations: NPO             Plan: Continue with current plan of care     GO               [facility], MA CCC-SLP [id number]  Lynann Beaver 06/10/2015, 10:17 AM

## 2015-06-10 NOTE — Progress Notes (Addendum)
[facility] PROGRESS NOTE  [name] [name] [id number] DOB: 11/09/1955 DOA: 05/29/2015 PCP: No primary care provider on file.  Length of stay: 11   Assessment/Plan: Active Problems:   Sepsis ([place])   CAP (community acquired pneumonia)   Encephalopathy   Encounter for central line placement   Hypoxia   Lactic acidosis   Altered mental state   Weakness   Encounter for feeding tube placement   Endotracheal tube present   Endotracheally intubated   Respiratory failure ([place])   Stroke (cerebrum) ([place])   Research study patient   Encounter for diagnostic procedure   Ventilator dependence ([place])   Carotid thromboses   HLD (hyperlipidemia)   Acute respiratory failure ([place])   Brief summary  60 y.o.right handed female with history of Sjogrens disease/rheumatoid arthritis, noted history of seizure but not on antiepileptic meds and dysphagia. Patient diagnosed with flu and placed on Tamiflu prior to admission.  Presented 05/29/2015 with right side weakness and gait instability. Patient with severe restrictive airway distress when he presented to the ED with oxygen saturations in the 70s requiring nonrebreathing mask and later intubated with slow extubation follow-up per critical care pulmonary services. Chest x-ray showed large right lung consolidative changes. CT angiogram chest no evidence of pulmonary emboli. Cranial CT scan negative. 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 Echocardiogram with ejection fraction of 65% no wall motion abnormalities. 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 EEG was negative for seizure. Patient did not receive TPA. Blood cultures showed group A strep pansensitive 05/30/2015 and currently maintained on Rocephin with droplet contact precautions. Await plan for possible TEE. Hospital course atrial flutter currently maintained on intravenous heparin. Plavix recommended for CVA however on hold as  patient may need PEG tube and remains nothing by mouth.    Assessment and plan Acute respiratory failure secondary to group A strep pneumonia-intubated 2/11- Extubated 2/21, patient was also treated for flu between 2/13-2/20, CIR consult pending approval, swallow eval , currently recommendation is to be NPO, may need PEG . Repeat chest x-ray today shows bilateral infiltrate right greater than left consistent with pneumonia.  Strep pyogenes bacteremia- continue ceftriaxone, if unable to obtain TEE due to esophageal stricture, will need 6 weeks, defer TEE until stronger. Positive blood culture from 2/12 showing group A strep pansensitive. Patient being followed by infectious disease. PICC line placed today after discussion with Dr. Drucilla Schmidt to proceed.  Septic shock-resolved,    Atrial flutter with rapid ventricular response resolved,EKG shows atrial flutter with variable A-V block 2/15 , 2-D echo on 2/16 showed EF of 60-65%, continue metoprolol. 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 Hypercoagulable workup in the future per neuro to rule out antiphospholipid antibody syndrome .EEG spike activity from the left frontal lobe, likely associated with current stroke  High LFTs - nml 2/14 , noted high 2/20, now decreasing, ? Right upper quadrant ultrasound shows cholelithiasis  multiple autoimmune conditions with Sjgren's, rheumatoid arthritis, on plaquenil and prednisone at home  Esophageal stricture-PEG tube placement today, may need outpatient EGD with endoscopy for dilatation in 6-8 weeks  DVT prophylaxsis heparin drip  Code Status:      Code  Status Orders        Start     Ordered   05/30/15 0228  Full code   Continuous     05/30/15 0229    Code Status History    Date Active Date Inactive Code Status Order ID Comments User Context   This patient has a current code status but no historical code status.      Family Communication: Discussed in detail with the patient and her husband in the room, all imaging results, lab results explained to the patient   Disposition Plan:  Transfer patient to step down, CIR next week    Consultants:  Pulmonary  CIR  Neurology    STUDIES:  Head CT 2/12 >> no acute hemorrhagic infarct.  Chest CT 2/12 >> ground glass opacities, consolidation c/w pneumonia.  CXR 2/14 / 2/15 >> little change. R lung consolidation, ? L infiltrate.  CXR 2/16 >> improving consolidation on the right MRI Head 2/15 > L Frontal precentral gyrus CVA CTA Head / Neck 2/16 > L CC thrombus CXR 2/20 > R pneumonia with slow improvement.  EEG 2/20 > Improved from previous. Resolution of Left frontal spikes.   Antibiotics: Anti-infectives    Start     Dose/Rate Route Frequency Ordered Stop   06/06/15 1200  fluconazole (DIFLUCAN) 40 MG/ML suspension 100 mg     100 mg Per Tube Daily 06/06/15 1134 06/08/15 1035   06/03/15 1045  cefTRIAXone (ROCEPHIN) 2 g in dextrose 5 % 50 mL IVPB  Status:  Discontinued     2 g 100 mL/hr over 30 Minutes Intravenous Every 24 hours 06/03/15 0924 06/03/15 0932   06/03/15 1000  cefTRIAXone (ROCEPHIN) 2 g in dextrose 5 % 50 mL IVPB     2 g 100 mL/hr over 30 Minutes Intravenous Every 12 hours 06/03/15 0932 07/11/15 2359   06/02/15 1045  cefTRIAXone (ROCEPHIN) 1 g in dextrose 5 % 50 mL IVPB  Status:  Discontinued     1 g 100 mL/hr over 30 Minutes Intravenous Every 24 hours 06/02/15 1040 06/03/15 0924   06/01/15 0000  vancomycin (VANCOCIN) IVPB 1000 mg/200 mL premix  Status:  Discontinued     1,000 mg 200 mL/hr over 60 Minutes Intravenous Every 12 hours 05/31/15 1340 06/02/15  1040   05/31/15 2200  oseltamivir (TAMIFLU) 6 MG/ML suspension 150 mg  Status:  Discontinued     150 mg Per Tube 2 times daily 05/31/15 1111 05/31/15 1421   05/31/15 1430  oseltamivir (TAMIFLU) 6 MG/ML suspension 150 mg     150 mg Per Tube 2 times daily 05/31/15 1421 06/06/15 2102   05/31/15 1400  clindamycin (CLEOCIN) IVPB 600 mg     600 mg 100 mL/hr over 30 Minutes Intravenous 3 times per day 05/31/15 1335 06/01/15 2202   05/30/15 2300  levofloxacin (LEVAQUIN) IVPB 750 mg  Status:  Discontinued     750 mg 100 mL/hr over 90 Minutes Intravenous Every 24 hours 05/30/15 0745 06/02/15 1040   05/30/15 1300  oseltamivir (TAMIFLU) 6 MG/ML suspension 75 mg  Status:  Discontinued     75 mg Per Tube 2 times daily 05/30/15 1252 05/31/15 1111   05/30/15 1200  vancomycin (VANCOCIN) 500 mg in sodium chloride 0.9 % 100 mL IVPB  Status:  Discontinued     500 mg 100 mL/hr over 60 Minutes Intravenous Every 12 hours 05/30/15 0745 05/31/15 1340  05/30/15 0800  acyclovir (ZOVIRAX) 500 mg in dextrose 5 % 100 mL IVPB  Status:  Discontinued     500 mg 110 mL/hr over 60 Minutes Intravenous 3 times per day 05/30/15 0725 05/30/15 1245   05/30/15 0800  aztreonam (AZACTAM) 2 g in dextrose 5 % 50 mL IVPB  Status:  Discontinued     2 g 100 mL/hr over 30 Minutes Intravenous Every 8 hours 05/30/15 0745 05/31/15 1331   05/30/15 0030  acyclovir (ZOVIRAX) 500 mg in dextrose 5 % 100 mL IVPB  Status:  Discontinued     500 mg 110 mL/hr over 60 Minutes Intravenous  Once 05/30/15 0016 05/30/15 0902   05/30/15 0015  levofloxacin (LEVAQUIN) IVPB 750 mg     750 mg 100 mL/hr over 90 Minutes Intravenous  Once 05/30/15 0001 05/30/15 0232   05/30/15 0015  aztreonam (AZACTAM) 2 g in dextrose 5 % 50 mL IVPB     2 g 100 mL/hr over 30 Minutes Intravenous  Once 05/30/15 0001 05/30/15 0115   05/30/15 0015  vancomycin (VANCOCIN) IVPB 1000 mg/200 mL premix     1,000 mg 200 mL/hr over 60 Minutes Intravenous  Once 05/30/15 0001 05/30/15  0232         HPI/Subjective: Patient very weak, still has NGT , receiving tube feeding,  Objective: Filed Vitals:   06/10/15 0830 06/10/15 0859 06/10/15 0900 06/10/15 1000  BP:   135/61 116/62  Pulse: 95  100 97  Temp:  98.3 F (36.8 C)    TempSrc:  Oral    Resp:    17  Height:      Weight:      SpO2: 91%  90% 95%    Intake/Output Summary (Last 24 hours) at 06/10/15 1126 Last data filed at 06/10/15 1000  Gross per 24 hour  Intake 1285.25 ml  Output    902 ml  Net 383.25 ml    Exam: General: sitting up in bed, talkative.  Neuro: Following commands, AAOx3, 4/5 weakness RLE, unable to move RUE, Left side with 4/5 weakness on the left.  HEENT:NCAT, NG tube, O/P clear. No LAD PULM: Crackles / rales on the right, left CTA, appropriate rate, unlabored.  CV: Normal s1 s2 RRT, No MGR GI: S, NT, ND, +BS, No organomegaly palpable.  Extremities: no edema, boots in place.   Data Review   Micro Results Recent Results (from the past 240 hour(s))  Culture, respiratory (NON-Expectorated)     Status: None   Collection Time: 06/01/15 10:35 AM  Result Value Ref Range Status   Specimen Description SPUTUM  Final   Special Requests NONE  Final   Gram Stain   Final    ABUNDANT WBC PRESENT,BOTH PMN AND MONONUCLEAR FEW SQUAMOUS EPITHELIAL CELLS PRESENT NO ORGANISMS SEEN Performed at [facility]    Culture   Final    RARE GROUP A STREP (S.PYOGENES) ISOLATED Performed at [facility]    Report Status 06/04/2015 FINAL  Final  Culture, blood (single)     Status: None   Collection Time: 06/01/15  2:32 PM  Result Value Ref Range Status   Specimen Description BLOOD RIGHT HAND  Final   Special Requests IN PEDIATRIC BOTTLE  4CC  Final   Culture NO GROWTH 5 DAYS  Final   Report Status 06/06/2015 FINAL  Final  Culture, blood (routine x 2)     Status: None   Collection Time: 06/02/15  1:24 PM  Result Value Ref Range Status  Specimen Description BLOOD RIGHT  ARM  Final   Special Requests BOTTLES DRAWN AEROBIC ONLY 5CC  Final   Culture NO GROWTH 5 DAYS  Final   Report Status 06/07/2015 FINAL  Final  Culture, blood (routine x 2)     Status: None   Collection Time: 06/02/15  1:26 PM  Result Value Ref Range Status   Specimen Description BLOOD RIGHT ARM  Final   Special Requests BOTTLES DRAWN AEROBIC ONLY 5CC  Final   Culture NO GROWTH 5 DAYS  Final   Report Status 06/07/2015 FINAL  Final  Culture, respiratory (NON-Expectorated)     Status: None   Collection Time: 06/03/15  4:30 PM  Result Value Ref Range Status   Specimen Description BRONCHIAL ALVEOLAR LAVAGE  Final   Special Requests NONE  Final   Gram Stain   Final    ABUNDANT WBC PRESENT, PREDOMINANTLY PMN NO SQUAMOUS EPITHELIAL CELLS SEEN NO ORGANISMS SEEN Performed at [facility]    Culture   Final    NO GROWTH 2 DAYS Performed at [facility]    Report Status 06/06/2015 FINAL  Final  Culture, respiratory (NON-Expectorated)     Status: None (Preliminary result)   Collection Time: 06/08/15  9:40 AM  Result Value Ref Range Status   Specimen Description TRACHEAL ASPIRATE  Final   Special Requests Immunocompromised  Final   Gram Stain   Final    ABUNDANT WBC PRESENT, PREDOMINANTLY PMN NO SQUAMOUS EPITHELIAL CELLS SEEN NO ORGANISMS SEEN Performed at [facility]    Culture   Final    Culture reincubated for better growth Performed at [facility]    Report Status PENDING  Incomplete    Radiology Reports Ct Angio Head W/cm &/or Wo Cm  06/03/2015  CLINICAL DATA:  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 Right facial droop and right arm paralysis. History of Sjogren's disease. EXAM: CT ANGIOGRAPHY HEAD AND NECK TECHNIQUE: Multidetector CT imaging of the head and neck was performed using the standard protocol during bolus administration of intravenous contrast. Multiplanar CT image reconstructions and MIPs were obtained to evaluate the  vascular anatomy. Carotid stenosis measurements (when applicable) are obtained utilizing NASCET criteria, using the distal internal carotid diameter as the denominator. CONTRAST:  61mL OMNIPAQUE IOHEXOL 350 MG/ML SOLN COMPARISON:  Brain MRI 06/03/2015 FINDINGS: CT HEAD Brain: Focal hypoattenuation in the posterior left frontal lobe involving the precentral gyrus corresponds to the acute infarct described on the recent MRI without interval change in size. Ventricles are normal in size. There is no evidence of acute intracranial hemorrhage, mass, midline shift, or extra-axial fluid collection. Calvarium and skull base: No fracture or destructive skull lesion. Paranasal sinuses: Small amount of fluid in the left maxillary and left sphenoid sinuses. Mild right maxillary sinus mucosal thickening. Small bilateral mastoid effusions. Orbits: Unremarkable. CTA NECK Aortic arch: 3 vessel aortic arch. Brachiocephalic and subclavian arteries are widely patent. Right carotid system: Patent without evidence of stenosis, dissection, or significant atherosclerosis. Left carotid system: There is a 7 x 3 mm filling defect projecting from the medial wall of the distal common carotid artery into the lumen just proximal to the bifurcation. The remainder of the common carotid artery as well as the cervical ICA are unremarkable allowing for slight limitation due to prominent metallic dental streak artifact through the mid ICA. Vertebral arteries: The vertebral arteries are widely patent with the left being slightly larger than the right. Skeleton: Mild-to-moderate C5-6 disc degeneration.  Other neck: Endotracheal tube in place. Ground-glass and consolidative opacities with air bronchograms partially visualized in the right upper lobe. Right pleural effusion partially visualized. Enteric tube partially visualized. Small or absent thyroid. Both submandibular glands appear atrophic. CTA HEAD Anterior circulation: The internal carotid arteries  are patent from skullbase to carotid termini the without stenosis. Minimal calcified plaque is noted at the anterior genu on the right. ACAs and MCAs are patent without evidence of significant stenosis or major branch occlusion. There is slight asymmetric attenuation of small, distal left MCA branch vessels in the area of acute infarction compared to the contralateral side. No intracranial aneurysm is identified. Posterior circulation: The intracranial vertebral arteries are patent with the left being dominant. The right vertebral artery is particularly hypoplastic distal to the PICA origin. PICA, AICA, and SCA origins are patent. The basilar artery is small in caliber diffusely without evidence of a significant focal stenosis. There are fetal type origins of both PCAs. No significant PCA stenosis is seen. Venous sinuses: Patent. Anatomic variants: Fetal origins of the PCAs. Delayed phase: No abnormal enhancement. IMPRESSION: 1. 7 mm filling defect in the distal left common carotid artery consistent with a small focus of thrombus. 2. Otherwise unremarkable appearance of the cervical carotid and vertebral arteries. 3. No major intracranial arterial branch occlusion or significant stenosis. Slight attenuation of distal left MCA branch vessels in the region of the left frontal infarct. 4. Extensive right lung consolidation, incompletely visualized. Small right pleural effusion. These results were called by telephone at the time of interpretation on 06/03/2015 at 4:18 pm to Dr. Lake Bells, who verbally acknowledged these results. Electronically Signed   By: Logan Bores M.D.   On: 06/03/2015 16:22   Ct Head Wo Contrast  05/30/2015  CLINICAL DATA:  Acute onset of right-sided facial droop and altered mental status. Right arm weakness. Initial encounter. EXAM: CT HEAD WITHOUT CONTRAST TECHNIQUE: Contiguous axial images were obtained from the base of the skull through the vertex without intravenous contrast. COMPARISON:   None. FINDINGS: There is no evidence of acute infarction, mass lesion, or intra- or extra-axial hemorrhage on CT. The posterior fossa, including the cerebellum, brainstem and fourth ventricle, is within normal limits. The third and lateral ventricles, and basal ganglia are unremarkable in appearance. The cerebral hemispheres are symmetric in appearance, with normal gray-white differentiation. No mass effect or midline shift is seen. There is no evidence of fracture; visualized osseous structures are unremarkable in appearance. The visualized portions of the orbits are within normal limits. Mild mucosal thickening is noted at the maxillary sinuses bilaterally and at the ethmoid air cells. The remaining paranasal sinuses and mastoid air cells are well-aerated. No significant soft tissue abnormalities are seen. IMPRESSION: 1. No definite acute intracranial pathology seen on CT. 2. Mild mucosal thickening at the maxillary sinuses bilaterally. Electronically Signed   By: Garald Balding M.D.   On: 05/30/2015 03:23   Ct Angio Neck W/cm &/or Wo/cm  06/03/2015  CLINICAL DATA:  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 Right facial droop and right arm paralysis. History of Sjogren's disease. EXAM: CT ANGIOGRAPHY HEAD AND NECK TECHNIQUE: Multidetector CT imaging of the head and neck was performed using the standard protocol during bolus administration of intravenous contrast. Multiplanar CT image reconstructions and MIPs were obtained to evaluate the vascular anatomy. Carotid stenosis measurements (when applicable) are obtained utilizing NASCET criteria, using the distal internal carotid diameter as the denominator. CONTRAST:  79mL OMNIPAQUE IOHEXOL 350 MG/ML SOLN COMPARISON:  Brain MRI 06/03/2015 FINDINGS: CT HEAD Brain: Focal hypoattenuation in the posterior left frontal lobe involving the precentral gyrus corresponds to the acute infarct described on the recent MRI without interval change in size. Ventricles  are normal in size. There is no evidence of acute intracranial hemorrhage, mass, midline shift, or extra-axial fluid collection. Calvarium and skull base: No fracture or destructive skull lesion. Paranasal sinuses: Small amount of fluid in the left maxillary and left sphenoid sinuses. Mild right maxillary sinus mucosal thickening. Small bilateral mastoid effusions. Orbits: Unremarkable. CTA NECK Aortic arch: 3 vessel aortic arch. Brachiocephalic and subclavian arteries are widely patent. Right carotid system: Patent without evidence of stenosis, dissection, or significant atherosclerosis. Left carotid system: There is a 7 x 3 mm filling defect projecting from the medial wall of the distal common carotid artery into the lumen just proximal to the bifurcation. The remainder of the common carotid artery as well as the cervical ICA are unremarkable allowing for slight limitation due to prominent metallic dental streak artifact through the mid ICA. Vertebral arteries: The vertebral arteries are widely patent with the left being slightly larger than the right. Skeleton: Mild-to-moderate C5-6 disc degeneration. Other neck: Endotracheal tube in place. Ground-glass and consolidative opacities with air bronchograms partially visualized in the right upper lobe. Right pleural effusion partially visualized. Enteric tube partially visualized. Small or absent thyroid. Both submandibular glands appear atrophic. CTA HEAD Anterior circulation: The internal carotid arteries are patent from skullbase to carotid termini the without stenosis. Minimal calcified plaque is noted at the anterior genu on the right. ACAs and MCAs are patent without evidence of significant stenosis or major branch occlusion. There is slight asymmetric attenuation of small, distal left MCA branch vessels in the area of acute infarction compared to the contralateral side. No intracranial aneurysm is identified. Posterior circulation: The intracranial vertebral  arteries are patent with the left being dominant. The right vertebral artery is particularly hypoplastic distal to the PICA origin. PICA, AICA, and SCA origins are patent. The basilar artery is small in caliber diffusely without evidence of a significant focal stenosis. There are fetal type origins of both PCAs. No significant PCA stenosis is seen. Venous sinuses: Patent. Anatomic variants: Fetal origins of the PCAs. Delayed phase: No abnormal enhancement. IMPRESSION: 1. 7 mm filling defect in the distal left common carotid artery consistent with a small focus of thrombus. 2. Otherwise unremarkable appearance of the cervical carotid and vertebral arteries. 3. No major intracranial arterial branch occlusion or significant stenosis. Slight attenuation of distal left MCA branch vessels in the region of the left frontal infarct. 4. Extensive right lung consolidation, incompletely visualized. Small right pleural effusion. These results were called by telephone at the time of interpretation on 06/03/2015 at 4:18 pm to Dr. Lake Bells, who verbally acknowledged these results. Electronically Signed   By: Logan Bores M.D.   On: 06/03/2015 16:22   Ct Angio Chest Pe W/cm &/or Wo Cm  05/30/2015  CLINICAL DATA:  60 year old female with hypoxia EXAM: CT ANGIOGRAPHY CHEST WITH CONTRAST TECHNIQUE: Multidetector CT imaging of the chest was performed using the standard protocol during bolus administration of intravenous contrast. Multiplanar CT image reconstructions and MIPs were obtained to evaluate the vascular anatomy. CONTRAST:  149mL OMNIPAQUE IOHEXOL 350 MG/ML SOLN COMPARISON:  Radiograph dated 05/30/2015 FINDINGS: There are large consolidative changes with air bronchogram involving the right upper lobe, right middle lobe, and right lower lobe. Scattered ground-glass and nodular density noted in the right apical region. There is a  patchy area of consolidative change with air bronchogram in the left lower lobe with scattered  ground-glass and nodular airspace opacity in the left lower lobe and lingula. No pleural effusion or pneumothorax. An endotracheal tube is noted with tip above the carina. The central airways are patent. The thoracic aorta appears unremarkable. There is mild prominence of the pulmonary trunk concerning for a degree of pulmonary hypertension. There is no CT evidence of pulmonary embolism. There bilateral hilar adenopathy. There is no cardiomegaly or pericardial effusion. The esophagus is grossly unremarkable. The chest wall soft tissues appear unremarkable. The osseous structures are intact. A paddle megaly. The visualized upper abdomen is otherwise grossly unremarkable. Review of the MIP images confirms the above findings. IMPRESSION: No CT evidence of pulmonary embolism. Extensive bilateral consolidative changes and scattered ground-glass pulmonary nodules compatible with pneumonia. Clinical correlation and follow-up resolution recommended Electronically Signed   By: Anner Crete M.D.   On: 05/30/2015 03:26   Mr Brain Wo Contrast  06/03/2015  CLINICAL DATA:  Weakness and altered mental status, diagnosed with flu 4 days ago. RIGHT facial droop and RIGHT arm paralysis. History of Sjogren's disease. EXAM: MRI HEAD WITHOUT CONTRAST TECHNIQUE: Multiplanar, multiecho pulse sequences of the brain and surrounding structures were obtained without intravenous contrast. COMPARISON:  CT head May 30, 2015 FINDINGS: 22 x 11 mm area of reduced diffusion LEFT posterior frontal lobe, precentral gyrus with FLAIR T2 hyperintense signal and low ADC values. No susceptibility artifact to suggest hemorrhage. Local mass effect without midline shift. Ventricles and sulci are normal for patient's age. A few scattered subcentimeter supratentorial white matter FLAIR T2 hyperintensities exclusive of the aforementioned abnormality compatible with mild chronic small vessel ischemic disease. No abnormal extra-axial fluid collections.  No extra-axial masses though, contrast enhanced sequences would be more sensitive. Normal major intracranial vascular flow voids seen at the skull base. Ocular globes and orbital contents are unremarkable though not tailored for evaluation. No abnormal sellar expansion. Moderate paranasal sinus mucosal thickening with scattered air-fluid levels, life-support lines in place. Bilateral mastoid effusions. 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 Mild chronic small vessel ischemic disease. Electronically Signed   By: Elon Alas M.D.   On: 06/03/2015 01:37   Dg Chest Port 1 View  06/10/2015  CLINICAL DATA:  Respiratory failure and short of breath EXAM: PORTABLE CHEST 1 VIEW COMPARISON:  06/08/2015 FINDINGS: Extensive infiltrate in the right lung shows progression. No effusion on the right. Mild left lower lobe infiltrate shows mild progression. No effusion on the left. Feeding tube enters the stomach with the tip not visualized. IMPRESSION: Progression of bilateral infiltrate right greater than left consistent with pneumonia. Electronically Signed   By: Franchot Gallo M.D.   On: 06/10/2015 07:13   Dg Chest Port 1 View  06/08/2015  CLINICAL DATA:  Respiratory failure EXAM: PORTABLE CHEST 1 VIEW COMPARISON:  06/07/2015 FINDINGS: Cardiac shadow is stable. A feeding catheter is again identified. Patchy infiltrate is noted throughout the right lung stable from the prior exam. Some slight increased density is now seen in the left base which may represent some early infiltrate as well. No other focal abnormality is noted. IMPRESSION: Stable right sided pneumonia. New left basilar infiltrative density. Electronically Signed   By: Inez Catalina M.D.   On: 06/08/2015 10:11   Dg Chest Port 1 View  06/07/2015  CLINICAL DATA:  Acute respiratory failure. EXAM: PORTABLE CHEST 1 VIEW COMPARISON:  06/06/2015. FINDINGS: Normal sized heart. No significant change  in right lung  airspace opacity. The clear left lung. Endotracheal tube in satisfactory position. Feeding tube extending into the stomach. Unremarkable bones. IMPRESSION: Stable right lung pneumonia. Electronically Signed   By: Claudie Revering M.D.   On: 06/07/2015 07:12   Dg Chest Port 1 View  06/06/2015  CLINICAL DATA:  Acute respiratory failure. Followup exam for pneumonia. EXAM: PORTABLE CHEST 1 VIEW COMPARISON:  06/04/2015 FINDINGS: Airspace consolidation in the right lung has mildly improved. There are no new areas of lung opacity. Endotracheal tube and enteric tube are stable in well positioned. IMPRESSION: 1. Mild improvement in the right lung pneumonia. 2. No new abnormalities. No other change. Support apparatus remains well-positioned. Electronically Signed   By: Lajean Manes M.D.   On: 06/06/2015 07:53   Dg Chest Port 1 View  06/04/2015  CLINICAL DATA:  Hypoxia EXAM: PORTABLE CHEST 1 VIEW COMPARISON:  June 03, 2015 FINDINGS: Endotracheal tube tip is 2.7 cm above the carina. Feeding tube tip is below the diaphragm. No pneumothorax. There is extensive airspace consolidation in portions of the right mid and lower lung zones, slightly less than 1 day prior. Mild patchy infiltrate in the left base is stable. No new opacity is seen. Heart is upper normal in size with pulmonary vascularity within normal limits. IMPRESSION: Extensive airspace consolidation throughout right mid and lower lung zones, slightly less than 1 day prior. Suspect pneumonia. Mild patchy infiltrate in the left base is also present, stable. No change in cardiac silhouette. Tube and catheter positions are as described without pneumothorax. Electronically Signed   By: Lowella Grip III M.D.   On: 06/04/2015 07:57   Dg Chest Port 1 View  06/03/2015  CLINICAL DATA:  Post bronchoscopy, intubation EXAM: PORTABLE CHEST 1 VIEW COMPARISON:  Portable exam 1624 hours compared to 0457 hours FINDINGS: Tip of endotracheal tube projects 5.4 cm above  carina. Nasogastric tube extends to at least mid stomach. Stable heart size and mediastinal contours for rotation to the LEFT. Slight pulmonary vascular congestion. Diffuse airspace infiltrate RIGHT upper and RIGHT lower lobes compatible with pneumonia. Minimal LEFT lower lobe infiltrate questioned. No pneumothorax. Bones unremarkable. IMPRESSION: Persistent diffuse RIGHT lung infiltrates with question minimal LEFT lower lobe infiltrate favoring pneumonia. Electronically Signed   By: Lavonia Dana M.D.   On: 06/03/2015 16:40   Dg Chest Port 1 View  06/03/2015  CLINICAL DATA:  Endotracheal intubation. EXAM: PORTABLE CHEST 1 VIEW COMPARISON:  06/02/2015 FINDINGS: Examination is limited due to patient rotation. Endotracheal tube is present with tip measuring about 2.7 cm above the carina. An enteric tube is present. The tip is off the field of view but is below the left hemidiaphragm. Heart size and pulmonary vascularity are normal. Diffuse airspace disease in the right mid and lower lung zone likely representing pneumonia. Small right pleural effusion. Mild left perihilar infiltration. No pneumothorax. Right central venous catheter appears to have been removed. IMPRESSION: Appliances appear in satisfactory location. Right mid and lower lung zone infiltrates with small right pleural effusion suggesting pneumonia. Mild left perihilar infiltration. No change since previous study, lying for differences in patient positioning. Electronically Signed   By: Lucienne Capers M.D.   On: 06/03/2015 05:57   Dg Chest Port 1 View  06/02/2015  CLINICAL DATA:  Endotracheal placement EXAM: PORTABLE CHEST 1 VIEW COMPARISON:  Earlier same day FINDINGS: Endotracheal tube has its tip 3 cm above the carina. Soft feeding tube enters the abdomen. Right subclavian central line has its tip in the  SVC above the right atrium. Bilateral lower lobe pneumonia right worse than left persists. Small amount of pleural fluid on the right. No new  finding. IMPRESSION: Endotracheal tube well position with its tip 3 cm above the carina. Bilateral lower lobe pneumonia right worse than left. Electronically Signed   By: Nelson Chimes M.D.   On: 06/02/2015 14:49   Dg Chest Port 1 View  06/02/2015  CLINICAL DATA:  Hypoxia/respiratory failure EXAM: PORTABLE CHEST 1 VIEW COMPARISON:  June 01, 2015 FINDINGS: Endotracheal tube tip is 1.3 cm above the carina. Central catheter tip is in the superior vena cava. Feeding tube tip is below the diaphragm. No pneumothorax. There is extensive airspace consolidation through much of the right mid and lower lung zones. There is patchy consolidation in the medial left base. Heart size and pulmonary vascularity are normal. No adenopathy. IMPRESSION: Tube and catheter positions as described without pneumothorax. Persistent extensive consolidation throughout much the right lung. There is patchy airspace consolidation in the medial left base, marginally increased from 1 day prior. No change in cardiac silhouette. Electronically Signed   By: Lowella Grip III M.D.   On: 06/02/2015 07:28   Dg Chest Port 1 View  06/01/2015  CLINICAL DATA:  Bilateral pneumonia. EXAM: PORTABLE CHEST 1 VIEW COMPARISON:  May 30, 2015. FINDINGS: Stable cardiomediastinal silhouette. Endotracheal tube is in grossly good position and unchanged. Interval placement of feeding tube which is seen entering stomach. Right subclavian catheter line is unchanged in position. No pneumothorax is noted. Stable large right lung pneumonia is noted. Stable minimal opacity is seen in left lung base. IMPRESSION: Stable large right lower lobe pneumonia. Endotracheal tube and right subclavian catheter are unchanged. Interval placement of feeding tube seen entering stomach Electronically Signed   By: Marijo Conception, M.D.   On: 06/01/2015 09:30   Dg Chest Port 1 View  05/30/2015  CLINICAL DATA:  Central line placement.  Initial encounter. EXAM: PORTABLE CHEST 1  VIEW COMPARISON:  Chest radiograph and CTA of the chest performed earlier today at 3:01 a.m. FINDINGS: A right subclavian line is noted ending about the cavoatrial junction. The patient's endotracheal tube is seen ending 2-3 cm above the carina. Dense right-sided and mild left basilar airspace opacities are again noted, compatible with multifocal pneumonia. No pleural effusion or pneumothorax is seen. The cardiomediastinal silhouette is normal in size. No acute osseous abnormalities are identified. IMPRESSION: 1. Right subclavian line noted ending about the cavoatrial junction. 2. Endotracheal tube seen ending 2-3 cm above the carina. 3. Bilateral pneumonia, worse on the right. Electronically Signed   By: Garald Balding M.D.   On: 05/30/2015 05:35   Dg Chest Port 1 View  05/30/2015  CLINICAL DATA:  Status post endotracheal tube placement. Initial encounter. EXAM: PORTABLE CHEST 1 VIEW COMPARISON:  Chest radiograph performed earlier today at 12:09 a.m. FINDINGS: The patient's endotracheal tube is seen ending 3-4 cm above the carina. Worsening right-sided pneumonia is noted. Mild patchy left basilar airspace opacity is also seen. No pleural effusion or pneumothorax is identified. The cardiomediastinal silhouette is borderline normal in size. No acute osseous abnormalities are identified. IMPRESSION: 1. Endotracheal tube seen ending 3-4 cm above the carina. 2. Worsening right-sided pneumonia noted. Mild patchy left basilar airspace opacity also seen. Electronically Signed   By: Garald Balding M.D.   On: 05/30/2015 02:13   Dg Chest Port 1 View  05/30/2015  CLINICAL DATA:  60 year old female with hypoxia. EXAM: PORTABLE CHEST 1 VIEW COMPARISON:  CT dated 08/11/2005 FINDINGS: There is a large area of consolidative change involving right mid and lower lung field with air bronchograms compatible with pneumonia. Underlying mass is not excluded. Clinical correlation and follow-up resolution is recommended. The left lung  is clear. No pleural effusion or pneumothorax. Cardiac silhouette is within normal limits. The osseous structures are unremarkable. IMPRESSION: Large right lung consolidative changes as described. Clinical correlation and follow-up to resolution recommended. Electronically Signed   By: Anner Crete M.D.   On: 05/30/2015 00:26   Dg Abd Portable 1v  05/31/2015  CLINICAL DATA:  Feeding tube placement EXAM: PORTABLE ABDOMEN - 1 VIEW COMPARISON:  None. FINDINGS: Feeding tube with the tip projecting over the proximal duodenum. There is no bowel dilatation to suggest obstruction. There is no evidence of pneumoperitoneum, portal venous gas or pneumatosis. There are no pathologic calcifications along the expected course of the ureters. The osseous structures are unremarkable. IMPRESSION: Feeding tube with the tip projecting over the proximal duodenum. Electronically Signed   By: Kathreen Devoid   On: 05/31/2015 13:08   Dg Swallowing Func-speech Pathology  06/09/2015  Objective Swallowing Evaluation: Type of Study: MBS-Modified Barium Swallow Study Patient Details Name: Kellyn Kutsch MRN: [id number] Date of Birth: 12/21/1955 Today's Date: 06/09/2015 Time: SLP Start Time (ACUTE ONLY): 1010-SLP Stop Time (ACUTE ONLY): 1035 SLP Time Calculation (min) (ACUTE ONLY): 25 min Past Medical History: Past Medical History Diagnosis Date . Hypothyroidism  . Seizures ([place])  . Adenopathy  . Scoliosis  . Spleen anomaly  . Anemia  . Sjogren's disease ([place])  . Raynaud disease  . Shingles  . Osteopenia  Past Surgical History: Past Surgical History Procedure Laterality Date . Tonsillectomy   . Breast cyst aspiration Right 1990's   neg HPI: Ms/ Moleski is a 60 y/o woman with a history of Sjogren's disease, hypothyroidism and dysphagia. She was diagnosed with the flu (A) about 4 days PTA and started on tamiflu. 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  Intubated from 2/12 to 2/21.There is a question of esophageal stricture; pt was seen by GI at [facility], complained of globus with pills and some solids, occuring 2-3x a week. Was scheduled for an EGD at [facility], but appears to have cancelled the procedure. 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 No Data Recorded Assessment / Plan / Recommendation CHL IP CLINICAL IMPRESSIONS 06/09/2015 Therapy Diagnosis Moderate cervical esophageal phase dysphagia;Severe pharyngeal phase dysphagia Clinical Impression Pt presents with a severe multifactoral dysphagia with sensory and motor deficits resulting in gross silent aspiration of thin liquids and penetration of nectar. Swallow is delayed, and laryngeal closure is incomplete, likely a result of prolonged intubation. More significant is noteably decreased opening of UES resulting is severe residuals with puree and thickened liquids. There is decreased hyolaryngeal excursion and poor pharyngeal constriction for bolus propulsion. 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 This type of deficit is often seen with Sjogren's disease and would could have played a role in occult aspiration contributing to pna. Of course, pt is severely deconditioned at this time, has a new CVA, has an NG tube impeding movement of structures and was intubated for 9 days, all of which play a role in pts dysphagia. Recommend pt remain  NPO with short term alternate means of nutrition with close monitoring for progress. Will need repeat MBS to determine readiness for PO.  Impact on safety and function Severe aspiration risk   CHL IP TREATMENT RECOMMENDATION 06/09/2015 Treatment Recommendations F/U MBS in --- days (Comment)   Prognosis 06/09/2015 Prognosis for Safe Diet Advancement Fair Barriers to Reach  Goals Severity of deficits Barriers/Prognosis Comment -- CHL IP DIET RECOMMENDATION 06/09/2015 SLP Diet Recommendations NPO;Alternative means - temporary Liquid Administration via -- Medication Administration Via alternative means Compensations -- Postural Changes --   CHL IP OTHER RECOMMENDATIONS 06/09/2015 Recommended Consults -- Oral Care Recommendations Oral care QID Other Recommendations --   CHL IP FOLLOW UP RECOMMENDATIONS 06/09/2015 Follow up Recommendations Inpatient Rehab   CHL IP FREQUENCY AND DURATION 06/09/2015 Speech Therapy Frequency (ACUTE ONLY) min 3x week Treatment Duration 2 weeks      CHL IP ORAL PHASE 06/09/2015 Oral Phase WFL Oral - Pudding Teaspoon -- Oral - Pudding Cup -- Oral - Honey Teaspoon -- Oral - Honey Cup -- Oral - Nectar Teaspoon -- Oral - Nectar Cup -- Oral - Nectar Straw -- Oral - Thin Teaspoon -- Oral - Thin Cup -- Oral - Thin Straw -- Oral - Puree -- Oral - Mech Soft -- Oral - Regular -- Oral - Multi-Consistency -- Oral - Pill -- Oral Phase - Comment --  CHL IP PHARYNGEAL PHASE 06/09/2015 Pharyngeal Phase Impaired Pharyngeal- Pudding Teaspoon -- Pharyngeal -- Pharyngeal- Pudding Cup -- Pharyngeal -- Pharyngeal- Honey Teaspoon Delayed swallow initiation-vallecula;Reduced epiglottic inversion;Reduced pharyngeal peristalsis;Reduced anterior laryngeal mobility;Reduced airway/laryngeal closure;Pharyngeal residue - valleculae;Pharyngeal residue - pyriform Pharyngeal -- Pharyngeal- Honey Cup -- Pharyngeal -- Pharyngeal- Nectar Teaspoon Delayed swallow initiation-vallecula;Reduced epiglottic inversion;Reduced pharyngeal peristalsis;Reduced anterior laryngeal mobility;Reduced airway/laryngeal closure;Pharyngeal residue - valleculae;Pharyngeal residue - pyriform;Penetration/Apiration after swallow;Penetration/Aspiration during swallow;Penetration/Aspiration before swallow Pharyngeal Material enters airway, passes BELOW cords without attempt by patient to eject out (silent aspiration);Material  enters airway, CONTACTS cords and not ejected out Pharyngeal- Nectar Cup -- Pharyngeal -- Pharyngeal- Nectar Straw -- Pharyngeal -- Pharyngeal- Thin Teaspoon -- Pharyngeal -- Pharyngeal- Thin Cup Delayed swallow initiation-vallecula;Reduced epiglottic inversion;Reduced pharyngeal peristalsis;Reduced anterior laryngeal mobility;Reduced airway/laryngeal closure;Pharyngeal residue - valleculae;Pharyngeal residue - pyriform;Penetration/Aspiration before swallow;Penetration/Aspiration during swallow;Penetration/Apiration after swallow;Significant aspiration (Amount) Pharyngeal Material enters airway, passes BELOW cords without attempt by patient to eject out (silent aspiration) Pharyngeal- Thin Straw -- Pharyngeal -- Pharyngeal- Puree Delayed swallow initiation-vallecula;Reduced epiglottic inversion;Reduced pharyngeal peristalsis;Reduced anterior laryngeal mobility;Reduced airway/laryngeal closure;Pharyngeal residue - valleculae;Pharyngeal residue - pyriform Pharyngeal -- Pharyngeal- Mechanical Soft -- Pharyngeal -- Pharyngeal- Regular -- Pharyngeal -- Pharyngeal- Multi-consistency -- Pharyngeal -- Pharyngeal- Pill -- Pharyngeal -- Pharyngeal Comment --  CHL IP CERVICAL ESOPHAGEAL PHASE 06/09/2015 Cervical Esophageal Phase Impaired Pudding Teaspoon -- Pudding Cup -- Honey Teaspoon -- Honey Cup -- Nectar Teaspoon -- Nectar Cup -- Nectar Straw -- Thin Teaspoon -- Thin Cup -- Thin Straw -- Puree -- Mechanical Soft -- Regular -- Multi-consistency -- Pill -- Cervical Esophageal Comment see impression No flowsheet data found. DeBlois, Katherene Ponto 06/09/2015, 11:25 AM              US Abdomen Limited Ruq  06/09/2015  CLINICAL DATA:  Elevated LFTs EXAM: US ABDOMEN LIMITED - RIGHT UPPER QUADRANT COMPARISON:  None. FINDINGS: Gallbladder: Gallstones are identified. No wall thickening or pericholecystic fluid is noted. Negative sonographic Percell Miller sign is noted. Common bile duct: Diameter: 1.9 mm. Liver: No focal lesion  identified. Within normal limits in parenchymal echogenicity. Incidental note is made of a small right-sided pleural  effusion. IMPRESSION: Cholelithiasis. Right pleural effusion. Electronically Signed   By: Inez Catalina M.D.   On: 06/09/2015 21:50     CBC  Recent Labs Lab 06/06/15 0240 06/07/15 0218 06/08/15 0359 06/09/15 0340 06/10/15 0400  WBC 24.8* 31.7* 27.6* 24.0* 20.6*  HGB 10.3* 9.7* 9.3* 9.5* 9.2*  HCT 33.3* 31.7* 30.9* 32.0* 30.6*  PLT 234 300 401* 479* 578*  MCV 88.8 90.8 92.5 93.6 93.9  MCH 27.5 27.8 27.8 27.8 28.2  MCHC 30.9 30.6 30.1 29.7* 30.1  RDW 16.0* 17.8* 18.9* 20.2* 20.6*    Chemistries   Recent Labs Lab 06/03/15 1830  06/05/15 0234 06/05/15 2140 06/06/15 0240 06/07/15 0210 06/07/15 0218 06/08/15 0359 06/09/15 0340 06/10/15 0400  NA 136  < > 138 137 137  --  138 137 137 136  K 3.2*  < > 4.4 4.0 4.0  --  4.0 4.2 4.3 4.4  CL 103  < > 105 104 105  --  104 104 100* 104  CO2 26  < > 25 25 26   --  27 27 25 23   GLUCOSE 175*  < > 175* 143* 132*  --  195* 161* 144* 138*  BUN 22*  < > 21* 22* 23*  --  25* 33* 30* 30*  CREATININE 0.59  < > 0.45 0.46 0.45  --  0.52 0.78 0.83 0.76  CALCIUM 8.1*  < > 8.4* 8.4* 8.3*  --  8.2* 8.2* 8.5* 8.3*  MG 1.8  --  1.9 1.9  --   --   --   --   --   --   AST  --   --   --   --   --  149*  --   --  102* 85*  ALT  --   --   --   --   --  108*  --   --  76* 65*  ALKPHOS  --   --   --   --   --  540*  --   --  563* 515*  BILITOT  --   --   --   --   --  0.3  --   --  0.4 0.2*  < > = values in this interval not displayed. ------------------------------------------------------------------------------------------------------------------ estimated creatinine clearance is 62.8 mL/min (by C-G formula based on Cr of 0.76). ------------------------------------------------------------------------------------------------------------------ No results for input(s): HGBA1C in the last 72  hours. ------------------------------------------------------------------------------------------------------------------ No results for input(s): CHOL, HDL, LDLCALC, TRIG, CHOLHDL, LDLDIRECT in the last 72 hours. ------------------------------------------------------------------------------------------------------------------ No results for input(s): TSH, T4TOTAL, T3FREE, THYROIDAB in the last 72 hours.  Invalid input(s): FREET3 ------------------------------------------------------------------------------------------------------------------ No results for input(s): VITAMINB12, FOLATE, FERRITIN, TIBC, IRON, RETICCTPCT in the last 72 hours.  Coagulation profile No results for input(s): INR, PROTIME in the last 168 hours.  No results for input(s): DDIMER in the last 72 hours.  Cardiac Enzymes No results for input(s): CKMB, TROPONINI, MYOGLOBIN in the last 168 hours.  Invalid input(s): CK ------------------------------------------------------------------------------------------------------------------ Invalid input(s): POCBNP   CBG:  Recent Labs Lab 06/09/15 1538 06/09/15 2012 06/09/15 2356 06/10/15 0338 06/10/15 0851  GLUCAP 149* 76 120* 127* 144*       Studies: Dg Chest Port 1 View  06/10/2015  CLINICAL DATA:  Respiratory failure and short of breath EXAM: PORTABLE CHEST 1 VIEW COMPARISON:  06/08/2015 FINDINGS: Extensive infiltrate in the right lung shows progression. No effusion on the right. Mild left lower lobe infiltrate shows mild progression. No effusion on the left. Feeding  tube enters the stomach with the tip not visualized. IMPRESSION: Progression of bilateral infiltrate right greater than left consistent with pneumonia. Electronically Signed   By: Franchot Gallo M.D.   On: 06/10/2015 07:13   Dg Swallowing Func-speech Pathology  06/09/2015  Objective Swallowing Evaluation: Type of Study: MBS-Modified Barium Swallow Study Patient Details Name: Dallys Windt MRN:  [id number] Date of Birth: 1956-01-26 Today's Date: 06/09/2015 Time: SLP Start Time (ACUTE ONLY): 1010-SLP Stop Time (ACUTE ONLY): 1035 SLP Time Calculation (min) (ACUTE ONLY): 25 min Past Medical History: Past Medical History Diagnosis Date . Hypothyroidism  . Seizures ([place])  . Adenopathy  . Scoliosis  . Spleen anomaly  . Anemia  . Sjogren's disease ([place])  . Raynaud disease  . Shingles  . Osteopenia  Past Surgical History: Past Surgical History Procedure Laterality Date . Tonsillectomy   . Breast cyst aspiration Right 1990's   neg HPI: Ms/ Ting is a 60 y/o woman with a history of Sjogren's disease, hypothyroidism and dysphagia. She was diagnosed with the flu (A) about 4 days PTA and started on tamiflu. 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  Intubated from 2/12 to 2/21.There is a question of esophageal stricture; pt was seen by GI at [facility], complained of globus with pills and some solids, occuring 2-3x a week. Was scheduled for an EGD at [facility], but appears to have cancelled the procedure. 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 No Data Recorded Assessment / Plan / Recommendation CHL IP CLINICAL IMPRESSIONS 06/09/2015 Therapy Diagnosis Moderate cervical esophageal phase dysphagia;Severe pharyngeal phase dysphagia Clinical Impression Pt presents with a severe multifactoral dysphagia with sensory and motor deficits resulting in gross silent aspiration of thin liquids and penetration of nectar. Swallow is delayed, and laryngeal closure is incomplete, likely a result of prolonged intubation. More significant is noteably decreased opening of UES resulting is severe residuals with puree and thickened liquids. There is decreased hyolaryngeal excursion and poor pharyngeal constriction for bolus propulsion. 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 This type of deficit is often seen with Sjogren's disease and would could have played a role in occult aspiration contributing to pna. Of course, pt is severely deconditioned at this time, has a new CVA, has an NG tube impeding movement of structures and was intubated for 9 days, all of which play a role in pts dysphagia. Recommend pt remain NPO with short term alternate means of nutrition with close monitoring for progress. Will need repeat MBS to determine readiness for PO.  Impact on safety and function Severe aspiration risk   CHL IP TREATMENT RECOMMENDATION 06/09/2015 Treatment Recommendations F/U MBS in --- days (Comment)   Prognosis 06/09/2015 Prognosis for Safe Diet Advancement Fair Barriers to Reach Goals Severity of deficits Barriers/Prognosis Comment -- CHL IP DIET RECOMMENDATION 06/09/2015 SLP Diet Recommendations NPO;Alternative means - temporary Liquid Administration via -- Medication Administration Via alternative means Compensations -- Postural Changes --   CHL IP OTHER RECOMMENDATIONS 06/09/2015 Recommended Consults -- Oral Care Recommendations Oral care QID Other Recommendations --   CHL IP FOLLOW UP RECOMMENDATIONS 06/09/2015 Follow up Recommendations Inpatient Rehab   CHL IP FREQUENCY AND DURATION 06/09/2015 Speech Therapy Frequency (ACUTE ONLY) min 3x week Treatment Duration 2 weeks  CHL IP ORAL PHASE 06/09/2015 Oral Phase WFL Oral - Pudding Teaspoon -- Oral - Pudding Cup -- Oral - Honey Teaspoon -- Oral - Honey Cup -- Oral - Nectar Teaspoon -- Oral - Nectar Cup -- Oral - Nectar Straw -- Oral - Thin Teaspoon -- Oral - Thin Cup -- Oral - Thin Straw -- Oral - Puree -- Oral - Mech Soft -- Oral - Regular -- Oral - Multi-Consistency -- Oral - Pill -- Oral Phase - Comment --  CHL IP PHARYNGEAL PHASE 06/09/2015 Pharyngeal Phase Impaired Pharyngeal- Pudding Teaspoon -- Pharyngeal --  Pharyngeal- Pudding Cup -- Pharyngeal -- Pharyngeal- Honey Teaspoon Delayed swallow initiation-vallecula;Reduced epiglottic inversion;Reduced pharyngeal peristalsis;Reduced anterior laryngeal mobility;Reduced airway/laryngeal closure;Pharyngeal residue - valleculae;Pharyngeal residue - pyriform Pharyngeal -- Pharyngeal- Honey Cup -- Pharyngeal -- Pharyngeal- Nectar Teaspoon Delayed swallow initiation-vallecula;Reduced epiglottic inversion;Reduced pharyngeal peristalsis;Reduced anterior laryngeal mobility;Reduced airway/laryngeal closure;Pharyngeal residue - valleculae;Pharyngeal residue - pyriform;Penetration/Apiration after swallow;Penetration/Aspiration during swallow;Penetration/Aspiration before swallow Pharyngeal Material enters airway, passes BELOW cords without attempt by patient to eject out (silent aspiration);Material enters airway, CONTACTS cords and not ejected out Pharyngeal- Nectar Cup -- Pharyngeal -- Pharyngeal- Nectar Straw -- Pharyngeal -- Pharyngeal- Thin Teaspoon -- Pharyngeal -- Pharyngeal- Thin Cup Delayed swallow initiation-vallecula;Reduced epiglottic inversion;Reduced pharyngeal peristalsis;Reduced anterior laryngeal mobility;Reduced airway/laryngeal closure;Pharyngeal residue - valleculae;Pharyngeal residue - pyriform;Penetration/Aspiration before swallow;Penetration/Aspiration during swallow;Penetration/Apiration after swallow;Significant aspiration (Amount) Pharyngeal Material enters airway, passes BELOW cords without attempt by patient to eject out (silent aspiration) Pharyngeal- Thin Straw -- Pharyngeal -- Pharyngeal- Puree Delayed swallow initiation-vallecula;Reduced epiglottic inversion;Reduced pharyngeal peristalsis;Reduced anterior laryngeal mobility;Reduced airway/laryngeal closure;Pharyngeal residue - valleculae;Pharyngeal residue - pyriform Pharyngeal -- Pharyngeal- Mechanical Soft -- Pharyngeal -- Pharyngeal- Regular -- Pharyngeal -- Pharyngeal- Multi-consistency -- Pharyngeal  -- Pharyngeal- Pill -- Pharyngeal -- Pharyngeal Comment --  CHL IP CERVICAL ESOPHAGEAL PHASE 06/09/2015 Cervical Esophageal Phase Impaired Pudding Teaspoon -- Pudding Cup -- Honey Teaspoon -- Honey Cup -- Nectar Teaspoon -- Nectar Cup -- Nectar Straw -- Thin Teaspoon -- Thin Cup -- Thin Straw -- Puree -- Mechanical Soft -- Regular -- Multi-consistency -- Pill -- Cervical Esophageal Comment see impression No flowsheet data found. De[place]is, Katherene Ponto 06/09/2015, 11:25 AM              US Abdomen Limited Ruq  06/09/2015  CLINICAL DATA:  Elevated LFTs EXAM: US ABDOMEN LIMITED - RIGHT UPPER QUADRANT COMPARISON:  None. FINDINGS: Gallbladder: Gallstones are identified. No wall thickening or pericholecystic fluid is noted. Negative sonographic Percell Miller sign is noted. Common bile duct: Diameter: 1.9 mm. Liver: No focal lesion identified. Within normal limits in parenchymal echogenicity. Incidental note is made of a small right-sided pleural effusion. IMPRESSION: Cholelithiasis. Right pleural effusion. Electronically Signed   By: Inez Catalina M.D.   On: 06/09/2015 21:50      Lab Results  Component Value Date   HGBA1C 6.6* 06/04/2015   Lab Results  Component Value Date   LDLCALC 65 06/03/2015   CREATININE 0.76 06/10/2015       Scheduled Meds: . antiseptic oral rinse  7 mL Mouth Rinse QID  . atorvastatin  40 mg Oral q1800  . cefTRIAXone (ROCEPHIN)  IV  2 g Intravenous Q12H  . chlorhexidine gluconate  15 mL Mouth Rinse BID  . furosemide  20 mg Intravenous Daily  . insulin aspart  0-15 Units Subcutaneous 6 times per day  . insulin glargine  10 Units Subcutaneous Daily  . levothyroxine  100 mcg Oral QAC breakfast  . metoprolol tartrate  12.5 mg Per Tube BID  .  pantoprazole sodium  40 mg Per Tube Q1200  . potassium chloride  40 mEq Oral Daily   Continuous Infusions: . sodium chloride 10 mL/hr at 06/08/15 0842  . sodium chloride    . feeding supplement (VITAL AF 1.2 CAL) 1,000 mL (06/10/15 0800)   . heparin 1,100 Units/hr (06/10/15 1057)    Active Problems:   Sepsis ([place])   CAP (community acquired pneumonia)   Encephalopathy   Encounter for central line placement   Hypoxia   Lactic acidosis   Altered mental state   Weakness   Encounter for feeding tube placement   Endotracheal tube present   Endotracheally intubated   Respiratory failure ([place])   Stroke (cerebrum) ([place])   Research study patient   Encounter for diagnostic procedure   Ventilator dependence ([place])   Carotid thromboses   HLD (hyperlipidemia)   Acute respiratory failure ([place])    Time spent: 45 minutes   [place] [facility] Pager [phone]. If 7PM-7AM, please contact night-coverage at [email or web address], password [facility] 06/10/2015, 11:26 AM  LOS: 11 days

## 2015-06-10 NOTE — H&P (Signed)
Physical Medicine and Rehabilitation Admission H&P    Chief Complaint  Patient presents with  . Code Stroke  : HPI: [name] [name] is a 60 y.o.right handed female with history of Sjogrens disease/rheumatoid arthritis maintained on plaquenil and Prednisone, noted history of seizure but not on antiepileptic meds and dysphagia. Patient diagnosed 4 days prior withFlu and placed on Tamiflu. Lives with spouse independent prior to admission. 2 level home with bedroom on main level. 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 Presented 05/29/2015 with right side weakness and gait instability. Patient with severe restrictive airway distress when he presented to the ED with oxygen saturations in the 70s requiring nonrebreathing mask and later intubated with slow extubation follow-up per critical care pulmonary services. Chest x-ray showed large right lung consolidative changes. CT angiogram chest no evidence of pulmonary emboli. Cranial CT scan negative. 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 Echocardiogram with ejection fraction of 65% no wall motion abnormalities. 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 EEG was negative for seizure. Patient did not receive TPA. Blood cultures showed group A strep pansensitive 05/30/2015 and currently maintained on Rocephin with droplet contact precautions with question duration of antibiotics.  Hospital course atrial flutter currently maintained Lovenox 40 mg daily and asymptomatic with low dose beta blocker added. Modified barium swallow 06/09/2015 shows severe pharyngeal dysphagia as well as esophageal stricture. Patient underwent gastrostomy tube per interventional radiology 06/14/2015. Currently maintained on Lovenox for DVT prophylaxis. Plan is to begin aspirin/ Plavix therapy for CVA prophylaxis after PEG tube feeds initiated. Plan for EGD with endoscopy in 6-8 weeks as outpatient for  dilatation of stricture. Physical therapy evaluation completed 06/09/2015 with recommendations of physical medicine rehabilitation consult. Patient was admitted for a comprehensive rehabilitation program  ROS Constitutional: Negative for fever.  HENT: Negative for hearing loss.  Eyes: Negative for blurred vision and double vision.  Respiratory: Positive for cough and shortness of breath.  Cardiovascular: Negative for chest pain.  Gastrointestinal: Positive for constipation. Negative for nausea and vomiting.  Genitourinary: Negative for dysuria and hematuria.  Musculoskeletal: Positive for myalgias and joint pain.  Skin: Positive for rash.  Neurological: Positive for weakness. Negative for loss of consciousness and headaches.  All other systems reviewed and are negative   Past Medical History  Diagnosis Date  . Hypothyroidism   . Seizures ([place])   . Adenopathy   . Scoliosis   . Spleen anomaly   . Anemia   . Sjogren's disease ([place])   . Raynaud disease   . Shingles   . Osteopenia    Past Surgical History  Procedure Laterality Date  . Tonsillectomy    . Breast cyst aspiration Right 1990's    neg   Family History  Problem Relation Age of Onset  . Breast cancer Maternal Grandfather 32   Social History:  reports that she has never smoked. She has never used smokeless tobacco. She reports that she does not drink alcohol or use illicit drugs. Allergies:  Allergies  Allergen Reactions  . Erythromycin Ethylsuccinate Hives  . Keflex [Cephalexin] Hives  . Penicillins   . Sulfa Antibiotics    Medications Prior to Admission  Medication Sig Dispense Refill  . cetirizine (ZYRTEC) 10 MG tablet Take 10 mg by mouth daily.    Marland Kitchen conjugated estrogens (PREMARIN) vaginal cream Place 1 Applicatorful vaginally 2 (two) times a week.    . fluticasone (FLONASE) 50  MCG/ACT nasal spray Place 2 sprays into both nostrils daily.    . [EXPIRED] HYDROcodone-homatropine (HYCODAN) 5-1.5 MG/5ML syrup  Take 1 mL by mouth every 6 (six) hours as needed.    . hydroxychloroquine (PLAQUENIL) 200 MG tablet Take by mouth daily.    Marland Kitchen levothyroxine (SYNTHROID, LEVOTHROID) 112 MCG tablet Take 112 mcg by mouth daily before breakfast.    . Multiple Vitamin (MULTIVITAMIN) tablet Take 1 tablet by mouth daily.    . [EXPIRED] ondansetron (ZOFRAN-ODT) 4 MG disintegrating tablet Take 1 tablet by mouth every 8 (eight) hours as needed.    . [EXPIRED] oseltamivir (TAMIFLU) 75 MG capsule Take 1 capsule by mouth 2 (two) times daily.    . predniSONE (DELTASONE) 10 MG tablet Take 10 mg by mouth daily with breakfast.    . predniSONE (DELTASONE) 20 MG tablet Take 1 tablet by mouth daily.    . tri-vitamin w/ fluoride (TRI-VI-SOL) 0.25 MG/ML solution Take 0.25 mg by mouth daily.    . vitamin B-12 (CYANOCOBALAMIN) 1000 MCG tablet Take 1,000 mcg by mouth daily.      Home: Home Living Family/patient expects to be discharged to:: Private residence Living Arrangements: Spouse/significant other Available Help at Discharge: Family Type of Home: House Home Access: Stairs to enter Technical brewer of Steps: 3 Home Layout: Two level, Able to live on main level with bedroom/bathroom, Full bath on main level Bathroom Shower/Tub: Gaffer, Chiropodist: Standard Home Equipment: Civil engineer, contracting, Civil engineer, contracting - built in Additional Comments: spouse works as a Research scientist (physical sciences) and patient is a Oncologist. husband visits [place] to see patients frequently   Functional History: Prior Function Level of Independence: Independent  Functional Status:  Mobility: Bed Mobility Overal bed mobility: Needs Assistance Bed Mobility: Supine to Sit Supine to sit: Mod assist Sit to supine: Max assist General bed mobility comments: Assist with elevating trunk and to scoot bottom to EOB. Able to use UE to move RLE to EOB. Increased time. Transfers Overall transfer level: Needs assistance Equipment used: 2 person  hand held assist Transfers: Sit to/from Stand, Stand Pivot Transfers Sit to Stand: Mod assist Stand pivot transfers: Mod assist, +2 physical assistance General transfer comment: Stood from EOB x1, from chair x1 with cues for anterior translation; manual cues for upright posture and hip/trunk extension. Able to initiate upright with verbal cues today but fatigues quickly. SPT bed to chair with MOd A of 2. Increased RR and Sp02 dropped to high 80s on RA.      ADL: ADL Overall ADL's : Needs assistance/impaired Eating/Feeding: NPO Grooming: Wash/dry face, Minimal assistance, Bed level (L UE) Lower Body Dressing: Maximal assistance, Bed level Lower Body Dressing Details (indicate cue type and reason): OT don sock over toes and pt able with (A) to pull sock over bil feet General ADL Comments: Pt tolerated EOB sitting for ~ 5 minutes. pt attempting to move R Ue and problem solve balance at EOB. pt encouraged to keep attempting to move R UE. pt with incr effort and attempts demonstrates movement. PT fixated on "dry" sensation from NPO status and pending MBS  Cognition: Cognition Overall Cognitive Status: Within Functional Limits for tasks assessed Orientation Level: Oriented to person, Oriented to place, Disoriented to time, Disoriented to situation Cognition Arousal/Alertness: Awake/alert Behavior During Therapy: Flat affect Overall Cognitive Status: Within Functional Limits for tasks assessed Area of Impairment: Awareness Orientation Level: Disoriented to, Place, Time, Situation Awareness: Intellectual General Comments: Fixated on "dry sensation in mouth.  Physical Exam: Blood  pressure 113/62, pulse 95, temperature 97.8 F (36.6 C), temperature source Oral, resp. rate 19, height 5' 2"  (1.575 m), weight 61.3 kg (135 lb 2.3 oz), SpO2 94 %. Physical Exam Constitutional: She is oriented to person, place, and time. She appears well-developed and well-nourished.  HENT:  Head: Normocephalic.  Atraumatic Eyes: EOM and Conj are normal.  Neck: Normal range of motion. Neck supple. No thyromegaly present.  Cardiovascular: Regular rate and rhythm.  Respiratory: Effort normal and breath sounds normal.  GI: Soft. Bowel sounds are normal. She exhibits no distension.  Neurological: She is oriented to person, place, and time.  Alert .Follows simple commands. Answers biographical questions.  Motor: Right shoulder 2+/5, distally 0/5 Right hip flexion 4-/5, knee extension 4/5, ankle dorsi/plantar flexion 4+/5 LUE/LLE: 5/5 DTRs 3+ RUE Sensation diminished to light touch LUE  Skin: Skin is warm and dry.  Psychiatric:  Affect flat but is cooperative   Results for orders placed or performed during the hospital encounter of 05/29/15 (from the past 48 hour(s))  Glucose, capillary     Status: Abnormal   Collection Time: 06/08/15  3:31 PM  Result Value Ref Range   Glucose-Capillary 103 (H) 65 - 99 mg/dL  Glucose, capillary     Status: Abnormal   Collection Time: 06/08/15  8:15 PM  Result Value Ref Range   Glucose-Capillary 126 (H) 65 - 99 mg/dL  Glucose, capillary     Status: Abnormal   Collection Time: 06/08/15 11:54 PM  Result Value Ref Range   Glucose-Capillary 129 (H) 65 - 99 mg/dL  Heparin level (unfractionated)     Status: None   Collection Time: 06/09/15  2:45 AM  Result Value Ref Range   Heparin Unfractionated 0.37 0.30 - 0.70 IU/mL    Comment:        IF HEPARIN RESULTS ARE BELOW EXPECTED VALUES, AND PATIENT DOSAGE HAS BEEN CONFIRMED, SUGGEST FOLLOW UP TESTING OF ANTITHROMBIN III LEVELS.   Glucose, capillary     Status: Abnormal   Collection Time: 06/09/15  3:38 AM  Result Value Ref Range   Glucose-Capillary 136 (H) 65 - 99 mg/dL  CBC     Status: Abnormal   Collection Time: 06/09/15  3:40 AM  Result Value Ref Range   WBC 24.0 (H) 4.0 - 10.5 K/uL   RBC 3.42 (L) 3.87 - 5.11 MIL/uL   Hemoglobin 9.5 (L) 12.0 - 15.0 g/dL   HCT 32.0 (L) 36.0 - 46.0 %   MCV 93.6 78.0 -  100.0 fL   MCH 27.8 26.0 - 34.0 pg   MCHC 29.7 (L) 30.0 - 36.0 g/dL   RDW 20.2 (H) 11.5 - 15.5 %   Platelets 479 (H) 150 - 400 K/uL  Basic metabolic panel     Status: Abnormal   Collection Time: 06/09/15  3:40 AM  Result Value Ref Range   Sodium 137 135 - 145 mmol/L   Potassium 4.3 3.5 - 5.1 mmol/L   Chloride 100 (L) 101 - 111 mmol/L   CO2 25 22 - 32 mmol/L   Glucose, Bld 144 (H) 65 - 99 mg/dL   BUN 30 (H) 6 - 20 mg/dL   Creatinine, Ser 0.83 0.44 - 1.00 mg/dL   Calcium 8.5 (L) 8.9 - 10.3 mg/dL   GFR calc non Af Amer >60 >60 mL/min   GFR calc Af Amer >60 >60 mL/min    Comment: (NOTE) The eGFR has been calculated using the CKD EPI equation. This calculation has not been validated in all  clinical situations. eGFR's persistently <60 mL/min signify possible Chronic Kidney Disease.    Anion gap 12 5 - 15  Hepatic function panel     Status: Abnormal   Collection Time: 06/09/15  3:40 AM  Result Value Ref Range   Total Protein 6.6 6.5 - 8.1 g/dL   Albumin 1.7 (L) 3.5 - 5.0 g/dL   AST 102 (H) 15 - 41 U/L   ALT 76 (H) 14 - 54 U/L   Alkaline Phosphatase 563 (H) 38 - 126 U/L   Total Bilirubin 0.4 0.3 - 1.2 mg/dL   Bilirubin, Direct 0.1 0.1 - 0.5 mg/dL   Indirect Bilirubin 0.3 0.3 - 0.9 mg/dL  Glucose, capillary     Status: Abnormal   Collection Time: 06/09/15  8:11 AM  Result Value Ref Range   Glucose-Capillary 156 (H) 65 - 99 mg/dL  Glucose, capillary     Status: Abnormal   Collection Time: 06/09/15 12:03 PM  Result Value Ref Range   Glucose-Capillary 109 (H) 65 - 99 mg/dL  Glucose, capillary     Status: Abnormal   Collection Time: 06/09/15  3:38 PM  Result Value Ref Range   Glucose-Capillary 149 (H) 65 - 99 mg/dL  Glucose, capillary     Status: None   Collection Time: 06/09/15  8:12 PM  Result Value Ref Range   Glucose-Capillary 76 65 - 99 mg/dL  Glucose, capillary     Status: Abnormal   Collection Time: 06/09/15 11:56 PM  Result Value Ref Range   Glucose-Capillary 120 (H)  65 - 99 mg/dL  Glucose, capillary     Status: Abnormal   Collection Time: 06/10/15  3:38 AM  Result Value Ref Range   Glucose-Capillary 127 (H) 65 - 99 mg/dL  CBC     Status: Abnormal   Collection Time: 06/10/15  4:00 AM  Result Value Ref Range   WBC 20.6 (H) 4.0 - 10.5 K/uL   RBC 3.26 (L) 3.87 - 5.11 MIL/uL   Hemoglobin 9.2 (L) 12.0 - 15.0 g/dL   HCT 30.6 (L) 36.0 - 46.0 %   MCV 93.9 78.0 - 100.0 fL   MCH 28.2 26.0 - 34.0 pg   MCHC 30.1 30.0 - 36.0 g/dL   RDW 20.6 (H) 11.5 - 15.5 %   Platelets 578 (H) 150 - 400 K/uL  Heparin level (unfractionated)     Status: Abnormal   Collection Time: 06/10/15  4:00 AM  Result Value Ref Range   Heparin Unfractionated 0.11 (L) 0.30 - 0.70 IU/mL    Comment:        IF HEPARIN RESULTS ARE BELOW EXPECTED VALUES, AND PATIENT DOSAGE HAS BEEN CONFIRMED, SUGGEST FOLLOW UP TESTING OF ANTITHROMBIN III LEVELS.   Comprehensive metabolic panel     Status: Abnormal   Collection Time: 06/10/15  4:00 AM  Result Value Ref Range   Sodium 136 135 - 145 mmol/L   Potassium 4.4 3.5 - 5.1 mmol/L   Chloride 104 101 - 111 mmol/L   CO2 23 22 - 32 mmol/L   Glucose, Bld 138 (H) 65 - 99 mg/dL   BUN 30 (H) 6 - 20 mg/dL   Creatinine, Ser 0.76 0.44 - 1.00 mg/dL   Calcium 8.3 (L) 8.9 - 10.3 mg/dL   Total Protein 6.3 (L) 6.5 - 8.1 g/dL   Albumin 1.7 (L) 3.5 - 5.0 g/dL   AST 85 (H) 15 - 41 U/L   ALT 65 (H) 14 - 54 U/L   Alkaline Phosphatase 515 (H)  38 - 126 U/L   Total Bilirubin 0.2 (L) 0.3 - 1.2 mg/dL   GFR calc non Af Amer >60 >60 mL/min   GFR calc Af Amer >60 >60 mL/min    Comment: (NOTE) The eGFR has been calculated using the CKD EPI equation. This calculation has not been validated in all clinical situations. eGFR's persistently <60 mL/min signify possible Chronic Kidney Disease.    Anion gap 9 5 - 15  Glucose, capillary     Status: Abnormal   Collection Time: 06/10/15  8:51 AM  Result Value Ref Range   Glucose-Capillary 144 (H) 65 - 99 mg/dL  Glucose,  capillary     Status: Abnormal   Collection Time: 06/10/15 11:47 AM  Result Value Ref Range   Glucose-Capillary 149 (H) 65 - 99 mg/dL  Heparin level (unfractionated)     Status: None   Collection Time: 06/10/15 11:59 AM  Result Value Ref Range   Heparin Unfractionated 0.46 0.30 - 0.70 IU/mL    Comment:        IF HEPARIN RESULTS ARE BELOW EXPECTED VALUES, AND PATIENT DOSAGE HAS BEEN CONFIRMED, SUGGEST FOLLOW UP TESTING OF ANTITHROMBIN III LEVELS.    Dg Chest Port 1 View  06/10/2015  CLINICAL DATA:  Respiratory failure and short of breath EXAM: PORTABLE CHEST 1 VIEW COMPARISON:  06/08/2015 FINDINGS: Extensive infiltrate in the right lung shows progression. No effusion on the right. Mild left lower lobe infiltrate shows mild progression. No effusion on the left. Feeding tube enters the stomach with the tip not visualized. IMPRESSION: Progression of bilateral infiltrate right greater than left consistent with pneumonia. Electronically Signed   By: Franchot Gallo M.D.   On: 06/10/2015 07:13   Dg Swallowing Func-speech Pathology  06/09/2015  Objective Swallowing Evaluation: Type of Study: MBS-Modified Barium Swallow Study Patient Details Name: Aylah Yeary MRN: [id number] Date of Birth: June 25, 1955 Today's Date: 06/09/2015 Time: SLP Start Time (ACUTE ONLY): 1010-SLP Stop Time (ACUTE ONLY): 1035 SLP Time Calculation (min) (ACUTE ONLY): 25 min Past Medical History: Past Medical History Diagnosis Date . Hypothyroidism  . Seizures ([place])  . Adenopathy  . Scoliosis  . Spleen anomaly  . Anemia  . Sjogren's disease ([place])  . Raynaud disease  . Shingles  . Osteopenia  Past Surgical History: Past Surgical History Procedure Laterality Date . Tonsillectomy   . Breast cyst aspiration Right 1990's   neg HPI: Ms/ Stanek is a 60 y/o woman with a history of Sjogren's disease, hypothyroidism and dysphagia. She was diagnosed with the flu (A) about 4 days PTA and started on tamiflu. 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  Intubated from 2/12 to 2/21.There is a question of esophageal stricture; pt was seen by GI at [facility], complained of globus with pills and some solids, occuring 2-3x a week. Was scheduled for an EGD at [facility], but appears to have cancelled the procedure. 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 No Data Recorded Assessment / Plan / Recommendation CHL IP CLINICAL IMPRESSIONS 06/09/2015 Therapy Diagnosis Moderate cervical esophageal phase dysphagia;Severe pharyngeal phase dysphagia Clinical Impression Pt presents with a severe multifactoral dysphagia with sensory and motor deficits resulting in gross silent aspiration of thin liquids and penetration of nectar. Swallow is delayed, and laryngeal closure is incomplete, likely a result of prolonged intubation. More significant is noteably decreased opening of UES resulting is severe residuals with puree  and thickened liquids. There is decreased hyolaryngeal excursion and poor pharyngeal constriction for bolus propulsion. 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 This type of deficit is often seen with Sjogren's disease and would could have played a role in occult aspiration contributing to pna. Of course, pt is severely deconditioned at this time, has a new CVA, has an NG tube impeding movement of structures and was intubated for 9 days, all of which play a role in pts dysphagia. Recommend pt remain NPO with short term alternate means of nutrition with close monitoring for progress. Will need repeat MBS to determine readiness for PO.  Impact on safety and function Severe aspiration risk   CHL IP TREATMENT RECOMMENDATION 06/09/2015 Treatment Recommendations F/U MBS in --- days  (Comment)   Prognosis 06/09/2015 Prognosis for Safe Diet Advancement Fair Barriers to Reach Goals Severity of deficits Barriers/Prognosis Comment -- CHL IP DIET RECOMMENDATION 06/09/2015 SLP Diet Recommendations NPO;Alternative means - temporary Liquid Administration via -- Medication Administration Via alternative means Compensations -- Postural Changes --   CHL IP OTHER RECOMMENDATIONS 06/09/2015 Recommended Consults -- Oral Care Recommendations Oral care QID Other Recommendations --   CHL IP FOLLOW UP RECOMMENDATIONS 06/09/2015 Follow up Recommendations Inpatient Rehab   CHL IP FREQUENCY AND DURATION 06/09/2015 Speech Therapy Frequency (ACUTE ONLY) min 3x week Treatment Duration 2 weeks      CHL IP ORAL PHASE 06/09/2015 Oral Phase WFL Oral - Pudding Teaspoon -- Oral - Pudding Cup -- Oral - Honey Teaspoon -- Oral - Honey Cup -- Oral - Nectar Teaspoon -- Oral - Nectar Cup -- Oral - Nectar Straw -- Oral - Thin Teaspoon -- Oral - Thin Cup -- Oral - Thin Straw -- Oral - Puree -- Oral - Mech Soft -- Oral - Regular -- Oral - Multi-Consistency -- Oral - Pill -- Oral Phase - Comment --  CHL IP PHARYNGEAL PHASE 06/09/2015 Pharyngeal Phase Impaired Pharyngeal- Pudding Teaspoon -- Pharyngeal -- Pharyngeal- Pudding Cup -- Pharyngeal -- Pharyngeal- Honey Teaspoon Delayed swallow initiation-vallecula;Reduced epiglottic inversion;Reduced pharyngeal peristalsis;Reduced anterior laryngeal mobility;Reduced airway/laryngeal closure;Pharyngeal residue - valleculae;Pharyngeal residue - pyriform Pharyngeal -- Pharyngeal- Honey Cup -- Pharyngeal -- Pharyngeal- Nectar Teaspoon Delayed swallow initiation-vallecula;Reduced epiglottic inversion;Reduced pharyngeal peristalsis;Reduced anterior laryngeal mobility;Reduced airway/laryngeal closure;Pharyngeal residue - valleculae;Pharyngeal residue - pyriform;Penetration/Apiration after swallow;Penetration/Aspiration during swallow;Penetration/Aspiration before swallow Pharyngeal Material enters airway,  passes BELOW cords without attempt by patient to eject out (silent aspiration);Material enters airway, CONTACTS cords and not ejected out Pharyngeal- Nectar Cup -- Pharyngeal -- Pharyngeal- Nectar Straw -- Pharyngeal -- Pharyngeal- Thin Teaspoon -- Pharyngeal -- Pharyngeal- Thin Cup Delayed swallow initiation-vallecula;Reduced epiglottic inversion;Reduced pharyngeal peristalsis;Reduced anterior laryngeal mobility;Reduced airway/laryngeal closure;Pharyngeal residue - valleculae;Pharyngeal residue - pyriform;Penetration/Aspiration before swallow;Penetration/Aspiration during swallow;Penetration/Apiration after swallow;Significant aspiration (Amount) Pharyngeal Material enters airway, passes BELOW cords without attempt by patient to eject out (silent aspiration) Pharyngeal- Thin Straw -- Pharyngeal -- Pharyngeal- Puree Delayed swallow initiation-vallecula;Reduced epiglottic inversion;Reduced pharyngeal peristalsis;Reduced anterior laryngeal mobility;Reduced airway/laryngeal closure;Pharyngeal residue - valleculae;Pharyngeal residue - pyriform Pharyngeal -- Pharyngeal- Mechanical Soft -- Pharyngeal -- Pharyngeal- Regular -- Pharyngeal -- Pharyngeal- Multi-consistency -- Pharyngeal -- Pharyngeal- Pill -- Pharyngeal -- Pharyngeal Comment --  CHL IP CERVICAL ESOPHAGEAL PHASE 06/09/2015 Cervical Esophageal Phase Impaired Pudding Teaspoon -- Pudding Cup -- Honey Teaspoon -- Honey Cup -- Nectar Teaspoon -- Nectar Cup -- Nectar Straw -- Thin Teaspoon -- Thin Cup -- Thin Straw --  Puree -- Mechanical Soft -- Regular -- Multi-consistency -- Pill -- Cervical Esophageal Comment see impression No flowsheet data found. DeBlois, Katherene Ponto 06/09/2015, 11:25 AM              US Abdomen Limited Ruq  06/09/2015  CLINICAL DATA:  Elevated LFTs EXAM: US ABDOMEN LIMITED - RIGHT UPPER QUADRANT COMPARISON:  None. FINDINGS: Gallbladder: Gallstones are identified. No wall thickening or pericholecystic fluid is noted. Negative sonographic  Percell Miller sign is noted. Common bile duct: Diameter: 1.9 mm. Liver: No focal lesion identified. Within normal limits in parenchymal echogenicity. Incidental note is made of a small right-sided pleural effusion. IMPRESSION: Cholelithiasis. Right pleural effusion. Electronically Signed   By: Inez Catalina M.D.   On: 06/09/2015 21:50    Medical Problem List and Plan: 1.  Right side weakness/dysphagia secondary to left frontal lobe infarct complicated by respiratory failure. 2.  DVT Prophylaxis/Anticoagulation: SCDs. Monitor for any signs of DVT 3. Pain Management: Tylenol as needed 4. Dysphagia/esophageal stricture. Status post gastrostomy tube 06/14/2015 per interventional radiology. Speech therapy follow-up. Plan outpatient EGD endoscopy for dilatation of stricture 5. Neuropsych: This patient is capable of making decisions on her own behalf. 6. Skin/Wound Care: Routine skin checks 7. Fluids/Electrolytes/Nutrition: Routine I&O's with follow-up chemistries 8. ID. Strep pyogenes bacteremia. Followed by infectious disease. Continue ceftriaxone questions 6 week course and confirm with Dr. Drucilla Schmidt. Maintain contact precautions 9. Atrial flutter with RVR. Lopressor 12.5 mg twice a day 10. Rheumatoid arthritis/Sjorgens disease. Discuss resuming plaquenil and chronic prednisone. 11. Hyperglycemia secondary to chronic prednisone/tube feeds. Hemoglobin A1c 6.6. Check blood sugars while on tube feeds 12. Hyperlipidemia. Lipitor 13. Hypothyroidism. Resume Synthroid  Post Admission Physician Evaluation: Functional deficits secondary  to left frontal lobe infarct complicated by respiratory failure. 1. Patient is admitted to receive collaborative, interdisciplinary care between the physiatrist, rehab nursing staff, and therapy team. 2. Patient's level of medical complexity and substantial therapy needs in context of that medical necessity cannot be provided at a lesser intensity of care such as a SNF. 3. Patient has  experienced substantial functional loss from his/her baseline which was documented above under the "Functional History" and "Functional Status" headings.  Judging by the patient's diagnosis, physical exam, and functional history, the patient has potential for functional progress which will result in measurable gains while on inpatient rehab.  These gains will be of substantial and practical use upon discharge  in facilitating mobility and self-care at the household level. 4. Physiatrist will provide 24 hour management of medical needs as well as oversight of the therapy plan/treatment and provide guidance as appropriate regarding the interaction of the two. 5. 24 hour rehab nursing will assist with safety, disease management, medication administration, and patient education and help integrate therapy concepts, techniques,education, etc. 6. PT will assess and treat for/with: Lower extremity strength, range of motion, stamina, balance, functional mobility, safety, adaptive techniques and equipment, coping skills, pain control, education.   Goals are: MinA/Supervision. 7. OT will assess and treat for/with: ADL's, functional mobility, safety, upper extremity strength, adaptive techniques and equipment, ego support, and community reintegration.   Goals are: MinA/Supervision. Therapy may proceed with showering this patient. 8. Case Management and Social Worker will assess and treat for psychological issues and discharge planning. 9. Team conference will be held weekly to assess progress toward goals and to determine barriers to discharge. 10. Patient will receive at least 3 hours of therapy per day at least 5 days per week. 11. ELOS: 19-22 days.  12. Prognosis:  good   Delice Lesch, MD 06/10/2015

## 2015-06-11 ENCOUNTER — Inpatient Hospital Stay (HOSPITAL_COMMUNITY): Payer: BC Managed Care – PPO

## 2015-06-11 ENCOUNTER — Encounter (HOSPITAL_COMMUNITY): Payer: Self-pay | Admitting: General Surgery

## 2015-06-11 DIAGNOSIS — R404 Transient alteration of awareness: Secondary | ICD-10-CM

## 2015-06-11 LAB — CBC
HCT: 28.6 % — ABNORMAL LOW (ref 36.0–46.0)
HEMATOCRIT: 29.6 % — AB (ref 36.0–46.0)
HEMOGLOBIN: 8.8 g/dL — AB (ref 12.0–15.0)
HEMOGLOBIN: 9 g/dL — AB (ref 12.0–15.0)
MCH: 29.6 pg (ref 26.0–34.0)
MCH: 28.5 pg (ref 26.0–34.0)
MCHC: 30.8 g/dL (ref 30.0–36.0)
MCHC: 30.4 g/dL (ref 30.0–36.0)
MCV: 96.3 fL (ref 78.0–100.0)
MCV: 93.7 fL (ref 78.0–100.0)
Platelets: 639 10*3/uL — ABNORMAL HIGH (ref 150–400)
Platelets: 716 10*3/uL — ABNORMAL HIGH (ref 150–400)
RBC: 2.97 MIL/uL — ABNORMAL LOW (ref 3.87–5.11)
RBC: 3.16 MIL/uL — AB (ref 3.87–5.11)
RDW: 21.4 % — AB (ref 11.5–15.5)
RDW: 20.9 % — ABNORMAL HIGH (ref 11.5–15.5)
WBC: 19.9 10*3/uL — ABNORMAL HIGH (ref 4.0–10.5)
WBC: 17.2 10*3/uL — AB (ref 4.0–10.5)

## 2015-06-11 LAB — GLUCOSE, CAPILLARY
GLUCOSE-CAPILLARY: 117 mg/dL — AB (ref 65–99)
GLUCOSE-CAPILLARY: 139 mg/dL — AB (ref 65–99)
Glucose-Capillary: 141 mg/dL — ABNORMAL HIGH (ref 65–99)
Glucose-Capillary: 108 mg/dL — ABNORMAL HIGH (ref 65–99)
Glucose-Capillary: 140 mg/dL — ABNORMAL HIGH (ref 65–99)
Glucose-Capillary: 118 mg/dL — ABNORMAL HIGH (ref 65–99)

## 2015-06-11 LAB — CULTURE, RESPIRATORY W GRAM STAIN

## 2015-06-11 LAB — CULTURE, RESPIRATORY

## 2015-06-11 LAB — HEPARIN LEVEL (UNFRACTIONATED): HEPARIN UNFRACTIONATED: 0.3 [IU]/mL (ref 0.30–0.70)

## 2015-06-11 MED ORDER — ASPIRIN 300 MG RE SUPP
300.0000 mg | Freq: Every day | RECTAL | Status: DC
  Administered 2015-06-11 – 2015-06-16 (×6): 300 mg via RECTAL
  Filled 2015-06-11 (×7): qty 1

## 2015-06-11 MED ORDER — ENOXAPARIN SODIUM 40 MG/0.4ML ~~LOC~~ SOLN: 40.0000 mg | Freq: Every morning | SUBCUTANEOUS | Status: DC

## 2015-06-11 MED ORDER — ENOXAPARIN SODIUM 40 MG/0.4ML ~~LOC~~ SOLN
40.0000 mg | SUBCUTANEOUS | Status: DC
  Administered 2015-06-15 – 2015-06-16 (×2): 40 mg via SUBCUTANEOUS
  Filled 2015-06-11 (×2): qty 0.4

## 2015-06-11 NOTE — Progress Notes (Signed)
Inpatient Rehabilitation  Met with patient at bedside and husband via phone to discuss IP Rehab admission target date of Monday 06/14/15 if medically ready.  Spoke with insurance case manager Malachy Mood and withdrew request for insurance authorization with plan to submit Monday with latest information.  Please call with questions.  Carmelia Roller., CCC/SLP Admission Coordinator  [place]  Cell [phone]

## 2015-06-11 NOTE — Consult Note (Signed)
Chief Complaint: dysphagia  Referring Physician:Alva  Hoss, Arnell Sieving  HPI: [name] [name] is an 60 y.o. female who presented to [facility] with a stroke. Unfortunately, she has developed dysphagia as well as right-sided weakness and immobility. She has failed her swallow evaluations. She was also noted to be bacteremic secondary to group a streptococcal pneumonia. Her most recent blood culture has been negative. We have been asked to place a gastrostomy tube.  Past Medical History:  Past Medical History  Diagnosis Date  . Hypothyroidism   . Seizures ([place])   . Adenopathy   . Scoliosis   . Spleen anomaly   . Anemia   . Sjogren's disease ([place])   . Raynaud disease   . Shingles   . Osteopenia     Past Surgical History:  Past Surgical History  Procedure Laterality Date  . Tonsillectomy    . Breast cyst aspiration Right 1990's    neg    Family History:  Family History  Problem Relation Age of Onset  . Breast cancer Maternal Grandfather 23    Social History:  reports that she has never smoked. She has never used smokeless tobacco. She reports that she does not drink alcohol or use illicit drugs.  Allergies:  Allergies  Allergen Reactions  . Erythromycin Ethylsuccinate Hives  . Keflex [Cephalexin] Hives  . Penicillins   . Sulfa Antibiotics     Medications:   Medication List    ASK your doctor about these medications        cetirizine 10 MG tablet  Commonly known as:  ZYRTEC  Take 10 mg by mouth daily.     conjugated estrogens vaginal cream  Commonly known as:  PREMARIN  Place 1 Applicatorful vaginally 2 (two) times a week.     fluticasone 50 MCG/ACT nasal spray  Commonly known as:  FLONASE  Place 2 sprays into both nostrils daily.     HYDROcodone-homatropine 5-1.5 MG/5ML syrup  Commonly known as:  HYCODAN  Take 1 mL by mouth every 6 (six) hours as needed.  Ask about: Should I take this medication?     hydroxychloroquine 200 MG tablet  Commonly known  as:  PLAQUENIL  Take by mouth daily.     levothyroxine 112 MCG tablet  Commonly known as:  SYNTHROID, LEVOTHROID  Take 112 mcg by mouth daily before breakfast.     multivitamin tablet  Take 1 tablet by mouth daily.     ondansetron 4 MG disintegrating tablet  Commonly known as:  ZOFRAN-ODT  Take 1 tablet by mouth every 8 (eight) hours as needed.  Ask about: Should I take this medication?     oseltamivir 75 MG capsule  Commonly known as:  TAMIFLU  Take 1 capsule by mouth 2 (two) times daily.  Ask about: Should I take this medication?     predniSONE 10 MG tablet  Commonly known as:  DELTASONE  Take 10 mg by mouth daily with breakfast.     predniSONE 20 MG tablet  Commonly known as:  DELTASONE  Take 1 tablet by mouth daily.     tri-vitamin w/ fluoride 0.25 MG/ML solution  Take 0.25 mg by mouth daily.     vitamin B-12 1000 MCG tablet  Commonly known as:  CYANOCOBALAMIN  Take 1,000 mcg by mouth daily.        Please HPI for pertinent positives, otherwise complete 10 system ROS negative.  Mallampati Score: MD Evaluation Airway: WNL Heart: WNL Abdomen: WNL Chest/ Lungs: WNL  ASA  Classification: 3 Mallampati/Airway Score: Two  Physical Exam: BP 116/58 mmHg  Pulse 87  Temp(Src) 98 F (36.7 C) (Oral)  Resp 17  Ht _0  (1.575 m)  Wt 128 lb 6.4 oz (58.242 kg)  BMI 23.48 kg/m2  SpO2 98% Body mass index is 23.48 kg/(m^2). General: pleasant, WD, WN white female who is laying in bed in NAD HEENT: head is normocephalic, atraumatic.  Sclera are noninjected.  PERRL.  Ears and nose without any masses or lesions, but a panda tube is in place with tube feeds running.  Mouth is pink and moist Heart: regular, rate, and rhythm.  Normal s1,s2. No obvious murmurs, gallops, or rubs noted.  Palpable radial and pedal pulses bilaterally Lungs: CTAB, no wheezes, rhonchi, or rales noted.  Respiratory effort nonlabored Abd: soft, NT, ND, +BS, no masses, hernias, or organomegaly Psych:  A&Ox3 with an appropriate affect.   Labs: Results for orders placed or performed during the hospital encounter of 05/29/15 (from the past 48 hour(s))  Glucose, capillary     Status: None   Collection Time: 06/09/15  8:12 PM  Result Value Ref Range   Glucose-Capillary 76 65 - 99 mg/dL  Glucose, capillary     Status: Abnormal   Collection Time: 06/09/15 11:56 PM  Result Value Ref Range   Glucose-Capillary 120 (H) 65 - 99 mg/dL  Glucose, capillary     Status: Abnormal   Collection Time: 06/10/15  3:38 AM  Result Value Ref Range   Glucose-Capillary 127 (H) 65 - 99 mg/dL  CBC     Status: Abnormal   Collection Time: 06/10/15  4:00 AM  Result Value Ref Range   WBC 20.6 (H) 4.0 - 10.5 K/uL   RBC 3.26 (L) 3.87 - 5.11 MIL/uL   Hemoglobin 9.2 (L) 12.0 - 15.0 g/dL   HCT 30.6 (L) 36.0 - 46.0 %   MCV 93.9 78.0 - 100.0 fL   MCH 28.2 26.0 - 34.0 pg   MCHC 30.1 30.0 - 36.0 g/dL   RDW 20.6 (H) 11.5 - 15.5 %   Platelets 578 (H) 150 - 400 K/uL  Heparin level (unfractionated)     Status: Abnormal   Collection Time: 06/10/15  4:00 AM  Result Value Ref Range   Heparin Unfractionated 0.11 (L) 0.30 - 0.70 IU/mL    Comment:        IF HEPARIN RESULTS ARE BELOW EXPECTED VALUES, AND PATIENT DOSAGE HAS BEEN CONFIRMED, SUGGEST FOLLOW UP TESTING OF ANTITHROMBIN III LEVELS.   Comprehensive metabolic panel     Status: Abnormal   Collection Time: 06/10/15  4:00 AM  Result Value Ref Range   Sodium 136 135 - 145 mmol/L   Potassium 4.4 3.5 - 5.1 mmol/L   Chloride 104 101 - 111 mmol/L   CO2 23 22 - 32 mmol/L   Glucose, Bld 138 (H) 65 - 99 mg/dL   BUN 30 (H) 6 - 20 mg/dL   Creatinine, Ser 0.76 0.44 - 1.00 mg/dL   Calcium 8.3 (L) 8.9 - 10.3 mg/dL   Total Protein 6.3 (L) 6.5 - 8.1 g/dL   Albumin 1.7 (L) 3.5 - 5.0 g/dL   AST 85 (H) 15 - 41 U/L   ALT 65 (H) 14 - 54 U/L   Alkaline Phosphatase 515 (H) 38 - 126 U/L   Total Bilirubin 0.2 (L) 0.3 - 1.2 mg/dL   GFR calc non Af Amer >60 >60 mL/min   GFR calc  Af Amer >60 >60 mL/min  Comment: (NOTE) The eGFR has been calculated using the CKD EPI equation. This calculation has not been validated in all clinical situations. eGFR's persistently <60 mL/min signify possible Chronic Kidney Disease.    Anion gap 9 5 - 15  Glucose, capillary     Status: Abnormal   Collection Time: 06/10/15  8:51 AM  Result Value Ref Range   Glucose-Capillary 144 (H) 65 - 99 mg/dL  Glucose, capillary     Status: Abnormal   Collection Time: 06/10/15 11:47 AM  Result Value Ref Range   Glucose-Capillary 149 (H) 65 - 99 mg/dL  Heparin level (unfractionated)     Status: None   Collection Time: 06/10/15 11:59 AM  Result Value Ref Range   Heparin Unfractionated 0.46 0.30 - 0.70 IU/mL    Comment:        IF HEPARIN RESULTS ARE BELOW EXPECTED VALUES, AND PATIENT DOSAGE HAS BEEN CONFIRMED, SUGGEST FOLLOW UP TESTING OF ANTITHROMBIN III LEVELS.   Glucose, capillary     Status: None   Collection Time: 06/10/15  4:38 PM  Result Value Ref Range   Glucose-Capillary 95 65 - 99 mg/dL  Glucose, capillary     Status: Abnormal   Collection Time: 06/10/15  8:04 PM  Result Value Ref Range   Glucose-Capillary 109 (H) 65 - 99 mg/dL  Heparin level (unfractionated)     Status: None   Collection Time: 06/10/15  9:00 PM  Result Value Ref Range   Heparin Unfractionated 0.41 0.30 - 0.70 IU/mL    Comment:        IF HEPARIN RESULTS ARE BELOW EXPECTED VALUES, AND PATIENT DOSAGE HAS BEEN CONFIRMED, SUGGEST FOLLOW UP TESTING OF ANTITHROMBIN III LEVELS.   Glucose, capillary     Status: Abnormal   Collection Time: 06/10/15 11:42 PM  Result Value Ref Range   Glucose-Capillary 117 (H) 65 - 99 mg/dL  CBC     Status: Abnormal   Collection Time: 06/11/15  3:23 AM  Result Value Ref Range   WBC 19.9 (H) 4.0 - 10.5 K/uL   RBC 2.97 (L) 3.87 - 5.11 MIL/uL   Hemoglobin 8.8 (L) 12.0 - 15.0 g/dL   HCT 28.6 (L) 36.0 - 46.0 %   MCV 96.3 78.0 - 100.0 fL   MCH 29.6 26.0 - 34.0 pg   MCHC 30.8  30.0 - 36.0 g/dL   RDW 21.4 (H) 11.5 - 15.5 %   Platelets 639 (H) 150 - 400 K/uL  Heparin level (unfractionated)     Status: None   Collection Time: 06/11/15  3:23 AM  Result Value Ref Range   Heparin Unfractionated 0.30 0.30 - 0.70 IU/mL    Comment:        IF HEPARIN RESULTS ARE BELOW EXPECTED VALUES, AND PATIENT DOSAGE HAS BEEN CONFIRMED, SUGGEST FOLLOW UP TESTING OF ANTITHROMBIN III LEVELS.   Glucose, capillary     Status: Abnormal   Collection Time: 06/11/15  5:30 AM  Result Value Ref Range   Glucose-Capillary 141 (H) 65 - 99 mg/dL  Glucose, capillary     Status: Abnormal   Collection Time: 06/11/15  7:48 AM  Result Value Ref Range   Glucose-Capillary 108 (H) 65 - 99 mg/dL  Glucose, capillary     Status: Abnormal   Collection Time: 06/11/15 12:08 PM  Result Value Ref Range   Glucose-Capillary 140 (H) 65 - 99 mg/dL  CBC     Status: Abnormal   Collection Time: 06/11/15  1:40 PM  Result Value Ref Range  WBC 17.2 (H) 4.0 - 10.5 K/uL   RBC 3.16 (L) 3.87 - 5.11 MIL/uL   Hemoglobin 9.0 (L) 12.0 - 15.0 g/dL   HCT 29.6 (L) 36.0 - 46.0 %   MCV 93.7 78.0 - 100.0 fL   MCH 28.5 26.0 - 34.0 pg   MCHC 30.4 30.0 - 36.0 g/dL   RDW 20.9 (H) 11.5 - 15.5 %   Platelets 716 (H) 150 - 400 K/uL    Imaging: US Renal  06/11/2015  CLINICAL DATA:  Gross hematuria EXAM: RENAL / URINARY TRACT ULTRASOUND COMPLETE COMPARISON:  None. FINDINGS: Right Kidney: Length: 12.1 cm. There is normal echogenicity. No hydronephrosis. A mid to upper pole cyst measures 8 x 8 mm. Left Kidney: Length: 12.3 cm in length. There is mild fullness of left extrarenal pelvis without frank hydronephrosis. No renal calculi are noted. Bladder: Not visualized empty. IMPRESSION: 1. No right hydronephrosis. A cyst in right kidney mid to upper pole measures 8 x 8 mm. 2. There is mild fullness of left extrarenal pelvis without frank hydronephrosis. No renal calculi. 3. The urinary bladder is not visualized empty. Electronically  Signed   By: Lahoma Crocker M.D.   On: 06/11/2015 10:59   Dg Chest Port 1 View  06/10/2015  CLINICAL DATA:  Respiratory failure and short of breath EXAM: PORTABLE CHEST 1 VIEW COMPARISON:  06/08/2015 FINDINGS: Extensive infiltrate in the right lung shows progression. No effusion on the right. Mild left lower lobe infiltrate shows mild progression. No effusion on the left. Feeding tube enters the stomach with the tip not visualized. IMPRESSION: Progression of bilateral infiltrate right greater than left consistent with pneumonia. Electronically Signed   By: Franchot Gallo M.D.   On: 06/10/2015 07:13   US Abdomen Limited Ruq  06/09/2015  CLINICAL DATA:  Elevated LFTs EXAM: US ABDOMEN LIMITED - RIGHT UPPER QUADRANT COMPARISON:  None. FINDINGS: Gallbladder: Gallstones are identified. No wall thickening or pericholecystic fluid is noted. Negative sonographic Percell Miller sign is noted. Common bile duct: Diameter: 1.9 mm. Liver: No focal lesion identified. Within normal limits in parenchymal echogenicity. Incidental note is made of a small right-sided pleural effusion. IMPRESSION: Cholelithiasis. Right pleural effusion. Electronically Signed   By: Inez Catalina M.D.   On: 06/09/2015 21:50    Assessment/Plan 1. Dysphagia secondary to recent CVA -We will tentatively plan for gastrostomy tube placement on Monday, 06/14/2015. -Nothing by mouth after midnight on Sunday. We will hold her tube feeds at midnight as well. -She is not on any blood thinners at this point. There is no plan to restart her heparin as of today. -Risks and Benefits discussed with the patient including, but not limited to the need for a barium enema during the procedure, bleeding, infection, peritonitis, or damage to adjacent structures. All of the patient's questions were answered, patient is agreeable to proceed. Consent signed and in chart.   Thank you for this interesting consult.  I greatly enjoyed meeting [name] [name] and look forward to  participating in their care.  A copy of this report was sent to the requesting provider on this date.  Electronically Signed: Henreitta Cea 06/11/2015, 4:40 PM   I spent a total of 20 Minutes   in face to face in clinical consultation, greater than 50% of which was counseling/coordinating care for dysphasia

## 2015-06-11 NOTE — Progress Notes (Signed)
Speech Language Pathology Treatment: Dysphagia  Patient Details Name: Nataki Arts MRN: [id number] DOB: 05-02-1955 Today's Date: 06/11/2015 Time: [id number] SLP Time Calculation (min) (ACUTE ONLY): 18 min  Assessment / Plan / Recommendation Clinical Impression  Pt recalled effortful swallow independently, and performed it with Min cues from SLP. Therapeutic trials of ice chips provided after oral care with intermittent coughing throughout. Mod multimodal cueing provided for attempts at [facility] maneuver. Pt and husband were educated about continuing plan of care: pt to perform exercises over the weekend with SLP f/u early next week with hopeful repeat testing soon.   HPI HPI: Ms/ Amsterdam is a 60 y/o woman with a history of Sjogren's disease, hypothyroidism and dysphagia. She was diagnosed with the flu (A) about 4 days PTA and started on tamiflu. 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  Intubated from 2/12 to 2/21.There is a question of esophageal stricture; pt was seen by GI at [facility], complained of globus with pills and some solids, occuring 2-3x a week. Was scheduled for an EGD at [facility], but appears to have cancelled the procedure. 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      SLP Plan  Continue with current plan of care     Recommendations  Diet recommendations: NPO Medication Administration: Via alternative means             Oral Care Recommendations: Oral care QID Follow up Recommendations: Inpatient Rehab Plan: Continue with current plan of care     GO               Germain Osgood, M.A. CCC-SLP [phone]  Germain Osgood 06/11/2015, 9:51 AM

## 2015-06-11 NOTE — Progress Notes (Signed)
ANTICOAGULATION CONSULT NOTE - Follow Up Consult  Pharmacy Consult for heparin Indication: aflutter/stroke  Allergies  Allergen Reactions  . Erythromycin Ethylsuccinate Hives  . Keflex [Cephalexin] Hives  . Penicillins   . Sulfa Antibiotics     Patient Measurements: Height: 5\' 2"  (157.5 cm) Weight: 128 lb 6.4 oz (58.242 kg) IBW/kg (Calculated) : 50.1 Heparin Dosing Weight: 65 kg  Vital Signs: Temp: 97.4 F (36.3 C) (02/24 0800) Temp Source: Oral (02/24 0800) BP: 115/60 mmHg (02/24 0800) Pulse Rate: 93 (02/24 0800)  Labs:  Recent Labs  06/09/15 0340 06/10/15 0400 06/10/15 1159 06/10/15 2100 06/11/15 0323  HGB 9.5* 9.2*  --   --  8.8*  HCT 32.0* 30.6*  --   --  28.6*  PLT 479* 578*  --   --  639*  HEPARINUNFRC  --  0.11* 0.46 0.41 0.30  CREATININE 0.83 0.76  --   --   --     Estimated Creatinine Clearance: 57.7 mL/min (by C-G formula based on Cr of 0.76).   Assessment: 60 yo f on heparin infusion for aflutter and CCA thrombus. 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 Pt has been having hematuria for ~5 days now - Hematuria is bright red blood this AM  Goal of Therapy:  Heparin level 0.3-0.5 units/ml Monitor platelets by anticoagulation protocol: Yes   Plan:  Hold heparin per Dr Allyson Sabal F/U hematuria and resumption of heparin if appropriate  Levester Fresh, PharmD, BCPS, [facility] Clinical Pharmacist Pager [phone] 06/11/2015 9:14 AM

## 2015-06-11 NOTE — Progress Notes (Signed)
Physical Therapy Treatment Patient Details Name: [name] [name] MRN: [id number] DOB: [birth year] Today's Date: 06/11/2015    History of Present Illness 5 female admitted with L Frontal CVA presumed embolic from L CCA thrombus. Pt wtih previous dx of flu 4 days prior to admission and started tamiflu. Ct (+) dense R infiltrated with volume loss  (+) PNA, group A strep bacteremia acute respiratory failure.  Pt intubated 05/29/15-06/08/15 PMH: sjogrens disease, hypothyroidism dysphagia seizures, adenopathy, scoliosis, spleen anomaly, anemnia, raynaud disease, osteopenia    PT Comments    Patient progressing with standing tolerance and able to take side steps up to [facility].  Still very fatigued and SOB with mobility and EOB activity and noted to be orthostatic with BP 83/53 in sitting.  Continue to feel she is appropriate for CIR level rehab.  Patient able to take 3 ice chips on spoon in sitting after oral care with increased time for swallow and noted breath holding/SOB.   Follow Up Recommendations  CIR     Equipment Recommendations  Other (comment) (TBA)    Recommendations for Other Services       Precautions / Restrictions Precautions Precautions: Fall Precaution Comments: R hemiplegia    Mobility  Bed Mobility Overal bed mobility: Needs Assistance Bed Mobility: Rolling Rolling: Mod assist;Min assist   Supine to sit: Max assist Sit to supine: Max assist   General bed mobility comments: Patient on bedpan upon my entry; assisted to roll min A to R and mod A to L for bedpan removal and hygiene; assist to sit up to EOB with legs off bed and to lift trunk upright; to supine assist for trunk and legs into bed, pt able to scoot in supine for positioning  Transfers Overall transfer level: Needs assistance   Transfers: Sit to/from Stand Sit to Stand: Mod assist         General transfer comment: assist with chair in front pulled up with L hand on chair back; stood x  2  Ambulation/Gait Ambulation/Gait assistance: Mod assist Ambulation Distance (Feet): 1 Feet Assistive device:  (back of chair) Gait Pattern/deviations: Decreased dorsiflexion - right;Decreased stride length;Shuffle     General Gait Details: side steps up to [place]            Wheelchair Mobility    Modified Rankin (Stroke Patients Only) Modified Rankin (Stroke Patients Only) Pre-Morbid Rankin Score: No symptoms Modified Rankin: Severe disability     Balance Overall balance assessment: Needs assistance Sitting-balance support: Feet supported;No upper extremity supported Sitting balance-Leahy Scale: Fair Sitting balance - Comments: sits EOB x about 10 min    Standing balance support: Single extremity supported Standing balance-Leahy Scale: Poor Standing balance comment: min/mod support and L hand on back of chair for support                    Cognition Arousal/Alertness: Awake/alert Behavior During Therapy: WFL for tasks assessed/performed Overall Cognitive Status: Within Functional Limits for tasks assessed                      Exercises Other Exercises Other Exercises: seated scapular squeeze x 5 w/ 5 sec hold and assist on R Other Exercises: lateral trunk rolls in hooklying x 10 Other Exercises: bridging x 5    General Comments        Pertinent Vitals/Pain Pain Assessment: No/denies pain    Home Living   Living Arrangements: Spouse/significant other  Prior Function            PT Goals (current goals can now be found in the care plan section) Progress towards PT goals: Progressing toward goals    Frequency  Min 3X/week    PT Plan Current plan remains appropriate    Co-evaluation             End of Session Equipment Utilized During Treatment: Gait belt;Oxygen Activity Tolerance: Patient limited by fatigue;Treatment limited secondary to medical complications (Comment) (low BP) Patient left:  in bed     Time: [id number] PT Time Calculation (min) (ACUTE ONLY): 29 min  Charges:  $Therapeutic Activity: 23-37 mins                    G Codes:      Reginia Naas 2015-06-13, 2:39 PM  Magda Kiel, [place] June 13, 2015

## 2015-06-11 NOTE — Progress Notes (Signed)
      INFECTIOUS DISEASE ATTENDING ADDENDUM:   PATIENT SHOULD BE SAFE FOR PEG TUBE PLACEMENT AND PICC LINE PLACEMENT SINCE HER BACTEREMIA HAS CLEARED.    Alcide Evener 06/11/2015, 8:29 AM

## 2015-06-11 NOTE — Progress Notes (Signed)
Abrol, MD notified that patient has returned from Renal US, prior to departure patient had 150ml bloody urine, sample saved if needed for further testing. Patient in no obvious signs of distress. Heparin is still off at this time. Will continue to monitor patient closely.

## 2015-06-11 NOTE — Progress Notes (Signed)
Patient ID: [name] [name], female   DOB: [birth year], 60 y.o.   MRN: [id number] Patient cleared for PEG placement by ID, unfortunately she is still getting TFs.  We will need to hold off until Monday.  We will follow up then.  [name] E 10:55 AM 06/11/2015

## 2015-06-11 NOTE — Progress Notes (Addendum)
[facility] PROGRESS NOTE  [name] [name] [id number] DOB: 11/27/1955 DOA: 05/29/2015 PCP: No primary care provider on file.  Length of stay: 12   Assessment/Plan: Active Problems:   Sepsis ([place])   CAP (community acquired pneumonia)   Encephalopathy   Encounter for central line placement   Hypoxia   Lactic acidosis   Altered mental state   Weakness   Encounter for feeding tube placement   Endotracheal tube present   Endotracheally intubated   Respiratory failure ([place])   Stroke (cerebrum) ([place])   Research study patient   Encounter for diagnostic procedure   Ventilator dependence ([place])   Carotid thromboses   HLD (hyperlipidemia)   Acute respiratory failure ([place])   Brief summary  60 y.o.right handed female with history of Sjogrens disease/rheumatoid arthritis, noted history of seizure but not on antiepileptic meds and dysphagia. Patient diagnosed with flu and placed on Tamiflu prior to admission.  Presented 05/29/2015 with right side weakness and gait instability. Patient with severe restrictive airway distress when he presented to the ED with oxygen saturations in the 70s requiring nonrebreathing mask and later intubated with slow extubation follow-up per critical care pulmonary services. Chest x-ray showed large right lung consolidative changes. CT angiogram chest no evidence of pulmonary emboli. Cranial CT scan negative. 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 Echocardiogram with ejection fraction of 65% no wall motion abnormalities. 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 EEG was negative for seizure. Patient did not receive TPA. Blood cultures showed group A strep pansensitive 05/30/2015 and currently maintained on Rocephin with droplet contact precautions. Await plan for possible TEE. Hospital course atrial flutter currently maintained on intravenous heparin. Plavix recommended for CVA however on hold as  patient may need PEG tube and remains nothing by mouth.    Assessment and plan Acute respiratory failure secondary to group A strep pneumonia-intubated 2/11- Extubated 2/21, patient was also treated for flu between 2/13-2/20, CIR consult pending approval, swallow eval , currently recommendation is to be NPO by speech therapy, Repeat chest x-ray today shows bilateral infiltrate right greater than left consistent with pneumonia. Continue strict aspiration precautions.  Strep pyogenes bacteremia- continue ceftriaxone, since we are unable to perform TEE due to esophageal stricture, will need 6 weeks, defer TEE until stronger. Positive blood culture from 2/12 showing group A strep pansensitive. Patient being followed by infectious disease. PICC line will be placed  , discussed with Dr. Drucilla Schmidt to proceed.  Septic shock-resolved,    Atrial flutter with rapid ventricular response resolved,EKG shows atrial flutter with variable A-V block 2/15 , 2-D echo on 2/16 showed EF of 60-65%, continue metoprolol. 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 Hypercoagulable workup in the future per neuro to rule out antiphospholipid antibody syndrome .EEG spike activity from the left frontal lobe, likely associated with current stroke  Transaminitis - nml 2/14 , noted high 2/20, now decreasing, ?  Right upper quadrant ultrasound shows cholelithiasis  multiple autoimmune conditions with Sjgren's, rheumatoid arthritis,  on plaquenil and prednisone at home  Esophageal stricture-PEG tube placement today, may need outpatient EGD with endoscopy for dilatation in 6-8 weeks.may need PEG, according to Dr. Drucilla Schmidt infectious disease it is okay to proceed with PEG tube placement .   Hematuria Held heparin drip for 3 hours, and not restarted due to ongoing hematuria  Will need urology consult if continues despite holding heparin Renal ultrasound to rule out nephrolithiasis, negative    DVT prophylaxsis lovenox   Code Status:      Code Status Orders        Start     Ordered   05/30/15 0228  Full code   Continuous     05/30/15 0229    Code Status History    Date Active Date Inactive Code Status Order ID Comments User Context   This patient has a current code status but no historical code status.      Family Communication: Discussed in detail with the patient and her husband in the room, all imaging results, lab results explained to the patient   Disposition Plan:  Pending PEG tube placement, CIR next week    Consultants:  Pulmonary  CIR  Neurology    STUDIES:  Head CT 2/12 >> no acute hemorrhagic infarct.  Chest CT 2/12 >> ground glass opacities, consolidation c/w pneumonia.  CXR 2/14 / 2/15 >> little change. R lung consolidation, ? L infiltrate.  CXR 2/16 >> improving consolidation on the right MRI Head 2/15 > L Frontal precentral gyrus CVA CTA Head / Neck 2/16 > L CC thrombus CXR 2/20 > R pneumonia with slow improvement.  EEG 2/20 > Improved from previous. Resolution of Left frontal spikes.   Antibiotics: Anti-infectives    Start     Dose/Rate Route Frequency Ordered Stop   06/06/15 1200  fluconazole (DIFLUCAN) 40 MG/ML suspension 100 mg     100 mg Per Tube Daily 06/06/15 1134 06/08/15 1035   06/03/15 1045  cefTRIAXone (ROCEPHIN) 2 g in dextrose 5 % 50 mL IVPB  Status:  Discontinued     2 g 100 mL/hr over 30 Minutes Intravenous Every 24 hours 06/03/15 0924 06/03/15 0932    06/03/15 1000  cefTRIAXone (ROCEPHIN) 2 g in dextrose 5 % 50 mL IVPB     2 g 100 mL/hr over 30 Minutes Intravenous Every 12 hours 06/03/15 0932 07/11/15 2359   06/02/15 1045  cefTRIAXone (ROCEPHIN) 1 g in dextrose 5 % 50 mL IVPB  Status:  Discontinued     1 g 100 mL/hr over 30 Minutes Intravenous Every 24 hours 06/02/15 1040 06/03/15 0924   06/01/15 0000  vancomycin (VANCOCIN) IVPB 1000 mg/200 mL premix  Status:  Discontinued     1,000 mg 200 mL/hr over 60 Minutes Intravenous Every 12 hours 05/31/15 1340 06/02/15 1040   05/31/15 2200  oseltamivir (TAMIFLU) 6 MG/ML suspension 150 mg  Status:  Discontinued     150 mg Per Tube 2 times daily 05/31/15 1111 05/31/15 1421   05/31/15 1430  oseltamivir (TAMIFLU) 6 MG/ML suspension 150 mg     150 mg Per Tube 2 times daily 05/31/15 1421 06/06/15 2102   05/31/15 1400  clindamycin (CLEOCIN) IVPB 600 mg     600 mg 100 mL/hr over 30 Minutes Intravenous 3 times per day 05/31/15 1335 06/01/15 2202   05/30/15 2300  levofloxacin (LEVAQUIN) IVPB 750 mg  Status:  Discontinued  750 mg 100 mL/hr over 90 Minutes Intravenous Every 24 hours 05/30/15 0745 06/02/15 1040   05/30/15 1300  oseltamivir (TAMIFLU) 6 MG/ML suspension 75 mg  Status:  Discontinued     75 mg Per Tube 2 times daily 05/30/15 1252 05/31/15 1111   05/30/15 1200  vancomycin (VANCOCIN) 500 mg in sodium chloride 0.9 % 100 mL IVPB  Status:  Discontinued     500 mg 100 mL/hr over 60 Minutes Intravenous Every 12 hours 05/30/15 0745 05/31/15 1340   05/30/15 0800  acyclovir (ZOVIRAX) 500 mg in dextrose 5 % 100 mL IVPB  Status:  Discontinued     500 mg 110 mL/hr over 60 Minutes Intravenous 3 times per day 05/30/15 0725 05/30/15 1245   05/30/15 0800  aztreonam (AZACTAM) 2 g in dextrose 5 % 50 mL IVPB  Status:  Discontinued     2 g 100 mL/hr over 30 Minutes Intravenous Every 8 hours 05/30/15 0745 05/31/15 1331   05/30/15 0030  acyclovir (ZOVIRAX) 500 mg in dextrose 5 % 100 mL IVPB  Status:   Discontinued     500 mg 110 mL/hr over 60 Minutes Intravenous  Once 05/30/15 0016 05/30/15 0902   05/30/15 0015  levofloxacin (LEVAQUIN) IVPB 750 mg     750 mg 100 mL/hr over 90 Minutes Intravenous  Once 05/30/15 0001 05/30/15 0232   05/30/15 0015  aztreonam (AZACTAM) 2 g in dextrose 5 % 50 mL IVPB     2 g 100 mL/hr over 30 Minutes Intravenous  Once 05/30/15 0001 05/30/15 0115   05/30/15 0015  vancomycin (VANCOCIN) IVPB 1000 mg/200 mL premix     1,000 mg 200 mL/hr over 60 Minutes Intravenous  Once 05/30/15 0001 05/30/15 0232         HPI/Subjective: Patient developed hematuria yesterday evening  Objective: Filed Vitals:   06/10/15 2339 06/11/15 0000 06/11/15 0104 06/11/15 0500  BP:  105/61 102/59   Pulse:  78 79   Temp: 98.3 F (36.8 C)     TempSrc: Oral     Resp:  16 20   Height: 5\' 2"  (1.575 m)     Weight: 58.242 kg (128 lb 6.4 oz)   58.242 kg (128 lb 6.4 oz)  SpO2:  100% 100%     Intake/Output Summary (Last 24 hours) at 06/11/15 0751 Last data filed at 06/11/15 0400  Gross per 24 hour  Intake 1556.48 ml  Output   1150 ml  Net 406.48 ml    Exam: General: sitting up in bed, talkative.  Neuro: Following commands, AAOx3, 4/5 weakness RLE, unable to move RUE, Left side with 4/5 weakness on the left.  HEENT:NCAT, NG tube, O/P clear. No LAD PULM: Crackles / rales on the right, left CTA, appropriate rate, unlabored.  CV: Normal s1 s2 RRT, No MGR GI: S, NT, ND, +BS, No organomegaly palpable.  Extremities: no edema, boots in place.   Data Review   Micro Results Recent Results (from the past 240 hour(s))  Culture, respiratory (NON-Expectorated)     Status: None   Collection Time: 06/01/15 10:35 AM  Result Value Ref Range Status   Specimen Description SPUTUM  Final   Special Requests NONE  Final   Gram Stain   Final    ABUNDANT WBC PRESENT,BOTH PMN AND MONONUCLEAR FEW SQUAMOUS EPITHELIAL CELLS PRESENT NO ORGANISMS SEEN Performed at [facility]     Culture   Final    RARE GROUP A STREP (S.PYOGENES) ISOLATED Performed at [facility]  Report Status 06/04/2015 FINAL  Final  Culture, blood (single)     Status: None   Collection Time: 06/01/15  2:32 PM  Result Value Ref Range Status   Specimen Description BLOOD RIGHT HAND  Final   Special Requests IN PEDIATRIC BOTTLE  4CC  Final   Culture NO GROWTH 5 DAYS  Final   Report Status 06/06/2015 FINAL  Final  Culture, blood (routine x 2)     Status: None   Collection Time: 06/02/15  1:24 PM  Result Value Ref Range Status   Specimen Description BLOOD RIGHT ARM  Final   Special Requests BOTTLES DRAWN AEROBIC ONLY 5CC  Final   Culture NO GROWTH 5 DAYS  Final   Report Status 06/07/2015 FINAL  Final  Culture, blood (routine x 2)     Status: None   Collection Time: 06/02/15  1:26 PM  Result Value Ref Range Status   Specimen Description BLOOD RIGHT ARM  Final   Special Requests BOTTLES DRAWN AEROBIC ONLY 5CC  Final   Culture NO GROWTH 5 DAYS  Final   Report Status 06/07/2015 FINAL  Final  Culture, respiratory (NON-Expectorated)     Status: None   Collection Time: 06/03/15  4:30 PM  Result Value Ref Range Status   Specimen Description BRONCHIAL ALVEOLAR LAVAGE  Final   Special Requests NONE  Final   Gram Stain   Final    ABUNDANT WBC PRESENT, PREDOMINANTLY PMN NO SQUAMOUS EPITHELIAL CELLS SEEN NO ORGANISMS SEEN Performed at [facility]    Culture   Final    NO GROWTH 2 DAYS Performed at [facility]    Report Status 06/06/2015 FINAL  Final  Culture, respiratory (NON-Expectorated)     Status: None (Preliminary result)   Collection Time: 06/08/15  9:40 AM  Result Value Ref Range Status   Specimen Description TRACHEAL ASPIRATE  Final   Special Requests Immunocompromised  Final   Gram Stain   Final    ABUNDANT WBC PRESENT, PREDOMINANTLY PMN NO SQUAMOUS EPITHELIAL CELLS SEEN NO ORGANISMS SEEN Performed at [facility]    Culture   Final     Culture reincubated for better growth Performed at [facility]    Report Status PENDING  Incomplete    Radiology Reports Ct Angio Head W/cm &/or Wo Cm  06/03/2015  CLINICAL DATA:  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 Right facial droop and right arm paralysis. History of Sjogren's disease. EXAM: CT ANGIOGRAPHY HEAD AND NECK TECHNIQUE: Multidetector CT imaging of the head and neck was performed using the standard protocol during bolus administration of intravenous contrast. Multiplanar CT image reconstructions and MIPs were obtained to evaluate the vascular anatomy. Carotid stenosis measurements (when applicable) are obtained utilizing NASCET criteria, using the distal internal carotid diameter as the denominator. CONTRAST:  77mL OMNIPAQUE IOHEXOL 350 MG/ML SOLN COMPARISON:  Brain MRI 06/03/2015 FINDINGS: CT HEAD Brain: Focal hypoattenuation in the posterior left frontal lobe involving the precentral gyrus corresponds to the acute infarct described on the recent MRI without interval change in size. Ventricles are normal in size. There is no evidence of acute intracranial hemorrhage, mass, midline shift, or extra-axial fluid collection. Calvarium and skull base: No fracture or destructive skull lesion. Paranasal sinuses: Small amount of fluid in the left maxillary and left sphenoid sinuses. Mild right maxillary sinus mucosal thickening. Small bilateral mastoid effusions. Orbits: Unremarkable. CTA NECK Aortic arch: 3 vessel aortic arch. Brachiocephalic and subclavian arteries are widely patent. Right  carotid system: Patent without evidence of stenosis, dissection, or significant atherosclerosis. Left carotid system: There is a 7 x 3 mm filling defect projecting from the medial wall of the distal common carotid artery into the lumen just proximal to the bifurcation. The remainder of the common carotid artery as well as the cervical ICA are unremarkable allowing for slight limitation due  to prominent metallic dental streak artifact through the mid ICA. Vertebral arteries: The vertebral arteries are widely patent with the left being slightly larger than the right. Skeleton: Mild-to-moderate C5-6 disc degeneration. Other neck: Endotracheal tube in place. Ground-glass and consolidative opacities with air bronchograms partially visualized in the right upper lobe. Right pleural effusion partially visualized. Enteric tube partially visualized. Small or absent thyroid. Both submandibular glands appear atrophic. CTA HEAD Anterior circulation: The internal carotid arteries are patent from skullbase to carotid termini the without stenosis. Minimal calcified plaque is noted at the anterior genu on the right. ACAs and MCAs are patent without evidence of significant stenosis or major branch occlusion. There is slight asymmetric attenuation of small, distal left MCA branch vessels in the area of acute infarction compared to the contralateral side. No intracranial aneurysm is identified. Posterior circulation: The intracranial vertebral arteries are patent with the left being dominant. The right vertebral artery is particularly hypoplastic distal to the PICA origin. PICA, AICA, and SCA origins are patent. The basilar artery is small in caliber diffusely without evidence of a significant focal stenosis. There are fetal type origins of both PCAs. No significant PCA stenosis is seen. Venous sinuses: Patent. Anatomic variants: Fetal origins of the PCAs. Delayed phase: No abnormal enhancement. IMPRESSION: 1. 7 mm filling defect in the distal left common carotid artery consistent with a small focus of thrombus. 2. Otherwise unremarkable appearance of the cervical carotid and vertebral arteries. 3. No major intracranial arterial branch occlusion or significant stenosis. Slight attenuation of distal left MCA branch vessels in the region of the left frontal infarct. 4. Extensive right lung consolidation, incompletely  visualized. Small right pleural effusion. These results were called by telephone at the time of interpretation on 06/03/2015 at 4:18 pm to Dr. Lake Bells, who verbally acknowledged these results. Electronically Signed   By: Logan Bores M.D.   On: 06/03/2015 16:22   Ct Head Wo Contrast  05/30/2015  CLINICAL DATA:  Acute onset of right-sided facial droop and altered mental status. Right arm weakness. Initial encounter. EXAM: CT HEAD WITHOUT CONTRAST TECHNIQUE: Contiguous axial images were obtained from the base of the skull through the vertex without intravenous contrast. COMPARISON:  None. FINDINGS: There is no evidence of acute infarction, mass lesion, or intra- or extra-axial hemorrhage on CT. The posterior fossa, including the cerebellum, brainstem and fourth ventricle, is within normal limits. The third and lateral ventricles, and basal ganglia are unremarkable in appearance. The cerebral hemispheres are symmetric in appearance, with normal gray-white differentiation. No mass effect or midline shift is seen. There is no evidence of fracture; visualized osseous structures are unremarkable in appearance. The visualized portions of the orbits are within normal limits. Mild mucosal thickening is noted at the maxillary sinuses bilaterally and at the ethmoid air cells. The remaining paranasal sinuses and mastoid air cells are well-aerated. No significant soft tissue abnormalities are seen. IMPRESSION: 1. No definite acute intracranial pathology seen on CT. 2. Mild mucosal thickening at the maxillary sinuses bilaterally. Electronically Signed   By: Garald Balding M.D.   On: 05/30/2015 03:23   Ct Angio Neck W/cm &/or Wo/cm  06/03/2015  CLINICAL DATA:  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 Right facial droop and right arm paralysis. History of Sjogren's disease. EXAM: CT ANGIOGRAPHY HEAD AND NECK TECHNIQUE: Multidetector CT imaging of the head and neck was performed using the standard protocol during  bolus administration of intravenous contrast. Multiplanar CT image reconstructions and MIPs were obtained to evaluate the vascular anatomy. Carotid stenosis measurements (when applicable) are obtained utilizing NASCET criteria, using the distal internal carotid diameter as the denominator. CONTRAST:  78mL OMNIPAQUE IOHEXOL 350 MG/ML SOLN COMPARISON:  Brain MRI 06/03/2015 FINDINGS: CT HEAD Brain: Focal hypoattenuation in the posterior left frontal lobe involving the precentral gyrus corresponds to the acute infarct described on the recent MRI without interval change in size. Ventricles are normal in size. There is no evidence of acute intracranial hemorrhage, mass, midline shift, or extra-axial fluid collection. Calvarium and skull base: No fracture or destructive skull lesion. Paranasal sinuses: Small amount of fluid in the left maxillary and left sphenoid sinuses. Mild right maxillary sinus mucosal thickening. Small bilateral mastoid effusions. Orbits: Unremarkable. CTA NECK Aortic arch: 3 vessel aortic arch. Brachiocephalic and subclavian arteries are widely patent. Right carotid system: Patent without evidence of stenosis, dissection, or significant atherosclerosis. Left carotid system: There is a 7 x 3 mm filling defect projecting from the medial wall of the distal common carotid artery into the lumen just proximal to the bifurcation. The remainder of the common carotid artery as well as the cervical ICA are unremarkable allowing for slight limitation due to prominent metallic dental streak artifact through the mid ICA. Vertebral arteries: The vertebral arteries are widely patent with the left being slightly larger than the right. Skeleton: Mild-to-moderate C5-6 disc degeneration. Other neck: Endotracheal tube in place. Ground-glass and consolidative opacities with air bronchograms partially visualized in the right upper lobe. Right pleural effusion partially visualized. Enteric tube partially visualized. Small  or absent thyroid. Both submandibular glands appear atrophic. CTA HEAD Anterior circulation: The internal carotid arteries are patent from skullbase to carotid termini the without stenosis. Minimal calcified plaque is noted at the anterior genu on the right. ACAs and MCAs are patent without evidence of significant stenosis or major branch occlusion. There is slight asymmetric attenuation of small, distal left MCA branch vessels in the area of acute infarction compared to the contralateral side. No intracranial aneurysm is identified. Posterior circulation: The intracranial vertebral arteries are patent with the left being dominant. The right vertebral artery is particularly hypoplastic distal to the PICA origin. PICA, AICA, and SCA origins are patent. The basilar artery is small in caliber diffusely without evidence of a significant focal stenosis. There are fetal type origins of both PCAs. No significant PCA stenosis is seen. Venous sinuses: Patent. Anatomic variants: Fetal origins of the PCAs. Delayed phase: No abnormal enhancement. IMPRESSION: 1. 7 mm filling defect in the distal left common carotid artery consistent with a small focus of thrombus. 2. Otherwise unremarkable appearance of the cervical carotid and vertebral arteries. 3. No major intracranial arterial branch occlusion or significant stenosis. Slight attenuation of distal left MCA branch vessels in the region of the left frontal infarct. 4. Extensive right lung consolidation, incompletely visualized. Small right pleural effusion. These results were called by telephone at the time of interpretation on 06/03/2015 at 4:18 pm to Dr. Lake Bells, who verbally acknowledged these results. Electronically Signed   By: Logan Bores M.D.   On: 06/03/2015 16:22   Ct Angio Chest Pe W/cm &/or Wo Cm  05/30/2015  CLINICAL DATA:  60 year old female with hypoxia EXAM: CT ANGIOGRAPHY CHEST WITH CONTRAST TECHNIQUE: Multidetector CT imaging of the chest was performed using  the standard protocol during bolus administration of intravenous contrast. Multiplanar CT image reconstructions and MIPs were obtained to evaluate the vascular anatomy. CONTRAST:  121mL OMNIPAQUE IOHEXOL 350 MG/ML SOLN COMPARISON:  Radiograph dated 05/30/2015 FINDINGS: There are large consolidative changes with air bronchogram involving the right upper lobe, right middle lobe, and right lower lobe. Scattered ground-glass and nodular density noted in the right apical region. There is a patchy area of consolidative change with air bronchogram in the left lower lobe with scattered ground-glass and nodular airspace opacity in the left lower lobe and lingula. No pleural effusion or pneumothorax. An endotracheal tube is noted with tip above the carina. The central airways are patent. The thoracic aorta appears unremarkable. There is mild prominence of the pulmonary trunk concerning for a degree of pulmonary hypertension. There is no CT evidence of pulmonary embolism. There bilateral hilar adenopathy. There is no cardiomegaly or pericardial effusion. The esophagus is grossly unremarkable. The chest wall soft tissues appear unremarkable. The osseous structures are intact. A paddle megaly. The visualized upper abdomen is otherwise grossly unremarkable. Review of the MIP images confirms the above findings. IMPRESSION: No CT evidence of pulmonary embolism. Extensive bilateral consolidative changes and scattered ground-glass pulmonary nodules compatible with pneumonia. Clinical correlation and follow-up resolution recommended Electronically Signed   By: Anner Crete M.D.   On: 05/30/2015 03:26   Mr Brain Wo Contrast  06/03/2015  CLINICAL DATA:  Weakness and altered mental status, diagnosed with flu 4 days ago. RIGHT facial droop and RIGHT arm paralysis. History of Sjogren's disease. EXAM: MRI HEAD WITHOUT CONTRAST TECHNIQUE: Multiplanar, multiecho pulse sequences of the brain and surrounding structures were obtained  without intravenous contrast. COMPARISON:  CT head May 30, 2015 FINDINGS: 22 x 11 mm area of reduced diffusion LEFT posterior frontal lobe, precentral gyrus with FLAIR T2 hyperintense signal and low ADC values. No susceptibility artifact to suggest hemorrhage. Local mass effect without midline shift. Ventricles and sulci are normal for patient's age. A few scattered subcentimeter supratentorial white matter FLAIR T2 hyperintensities exclusive of the aforementioned abnormality compatible with mild chronic small vessel ischemic disease. No abnormal extra-axial fluid collections. No extra-axial masses though, contrast enhanced sequences would be more sensitive. Normal major intracranial vascular flow voids seen at the skull base. Ocular globes and orbital contents are unremarkable though not tailored for evaluation. No abnormal sellar expansion. Moderate paranasal sinus mucosal thickening with scattered air-fluid levels, life-support lines in place. Bilateral mastoid effusions. 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 Mild chronic small vessel ischemic disease. Electronically Signed   By: Elon Alas M.D.   On: 06/03/2015 01:37   Dg Chest Port 1 View  06/10/2015  CLINICAL DATA:  Respiratory failure and short of breath EXAM: PORTABLE CHEST 1 VIEW COMPARISON:  06/08/2015 FINDINGS: Extensive infiltrate in the right lung shows progression. No effusion on the right. Mild left lower lobe infiltrate shows mild progression. No effusion on the left. Feeding tube enters the stomach with the tip not visualized. IMPRESSION: Progression of bilateral infiltrate right greater than left consistent with pneumonia. Electronically Signed   By: Franchot Gallo M.D.   On: 06/10/2015 07:13   Dg Chest Port 1 View  06/08/2015  CLINICAL DATA:  Respiratory failure EXAM: PORTABLE CHEST 1 VIEW COMPARISON:  06/07/2015 FINDINGS: Cardiac shadow is stable. A feeding catheter is again  identified. Patchy infiltrate is noted throughout the right lung stable from the prior exam. Some slight increased density is now seen in the left base which may represent some early infiltrate as well. No other focal abnormality is noted. IMPRESSION: Stable right sided pneumonia. New left basilar infiltrative density. Electronically Signed   By: Inez Catalina M.D.   On: 06/08/2015 10:11   Dg Chest Port 1 View  06/07/2015  CLINICAL DATA:  Acute respiratory failure. EXAM: PORTABLE CHEST 1 VIEW COMPARISON:  06/06/2015. FINDINGS: Normal sized heart. No significant change in right lung airspace opacity. The clear left lung. Endotracheal tube in satisfactory position. Feeding tube extending into the stomach. Unremarkable bones. IMPRESSION: Stable right lung pneumonia. Electronically Signed   By: Claudie Revering M.D.   On: 06/07/2015 07:12   Dg Chest Port 1 View  06/06/2015  CLINICAL DATA:  Acute respiratory failure. Followup exam for pneumonia. EXAM: PORTABLE CHEST 1 VIEW COMPARISON:  06/04/2015 FINDINGS: Airspace consolidation in the right lung has mildly improved. There are no new areas of lung opacity. Endotracheal tube and enteric tube are stable in well positioned. IMPRESSION: 1. Mild improvement in the right lung pneumonia. 2. No new abnormalities. No other change. Support apparatus remains well-positioned. Electronically Signed   By: Lajean Manes M.D.   On: 06/06/2015 07:53   Dg Chest Port 1 View  06/04/2015  CLINICAL DATA:  Hypoxia EXAM: PORTABLE CHEST 1 VIEW COMPARISON:  June 03, 2015 FINDINGS: Endotracheal tube tip is 2.7 cm above the carina. Feeding tube tip is below the diaphragm. No pneumothorax. There is extensive airspace consolidation in portions of the right mid and lower lung zones, slightly less than 1 day prior. Mild patchy infiltrate in the left base is stable. No new opacity is seen. Heart is upper normal in size with pulmonary vascularity within normal limits. IMPRESSION: Extensive  airspace consolidation throughout right mid and lower lung zones, slightly less than 1 day prior. Suspect pneumonia. Mild patchy infiltrate in the left base is also present, stable. No change in cardiac silhouette. Tube and catheter positions are as described without pneumothorax. Electronically Signed   By: Lowella Grip III M.D.   On: 06/04/2015 07:57   Dg Chest Port 1 View  06/03/2015  CLINICAL DATA:  Post bronchoscopy, intubation EXAM: PORTABLE CHEST 1 VIEW COMPARISON:  Portable exam 1624 hours compared to 0457 hours FINDINGS: Tip of endotracheal tube projects 5.4 cm above carina. Nasogastric tube extends to at least mid stomach. Stable heart size and mediastinal contours for rotation to the LEFT. Slight pulmonary vascular congestion. Diffuse airspace infiltrate RIGHT upper and RIGHT lower lobes compatible with pneumonia. Minimal LEFT lower lobe infiltrate questioned. No pneumothorax. Bones unremarkable. IMPRESSION: Persistent diffuse RIGHT lung infiltrates with question minimal LEFT lower lobe infiltrate favoring pneumonia. Electronically Signed   By: Lavonia Dana M.D.   On: 06/03/2015 16:40   Dg Chest Port 1 View  06/03/2015  CLINICAL DATA:  Endotracheal intubation. EXAM: PORTABLE CHEST 1 VIEW COMPARISON:  06/02/2015 FINDINGS: Examination is limited due to patient rotation. Endotracheal tube is present with tip measuring about 2.7 cm above the carina. An enteric tube is present. The tip is off the field of view but is below the left hemidiaphragm. Heart size and pulmonary vascularity are normal. Diffuse airspace disease in the right mid and lower lung zone likely representing pneumonia. Small right pleural effusion. Mild left perihilar infiltration. No pneumothorax. Right central venous catheter appears to have been removed. IMPRESSION: Appliances appear in satisfactory location. Right  mid and lower lung zone infiltrates with small right pleural effusion suggesting pneumonia. Mild left perihilar  infiltration. No change since previous study, lying for differences in patient positioning. Electronically Signed   By: Lucienne Capers M.D.   On: 06/03/2015 05:57   Dg Chest Port 1 View  06/02/2015  CLINICAL DATA:  Endotracheal placement EXAM: PORTABLE CHEST 1 VIEW COMPARISON:  Earlier same day FINDINGS: Endotracheal tube has its tip 3 cm above the carina. Soft feeding tube enters the abdomen. Right subclavian central line has its tip in the SVC above the right atrium. Bilateral lower lobe pneumonia right worse than left persists. Small amount of pleural fluid on the right. No new finding. IMPRESSION: Endotracheal tube well position with its tip 3 cm above the carina. Bilateral lower lobe pneumonia right worse than left. Electronically Signed   By: Nelson Chimes M.D.   On: 06/02/2015 14:49   Dg Chest Port 1 View  06/02/2015  CLINICAL DATA:  Hypoxia/respiratory failure EXAM: PORTABLE CHEST 1 VIEW COMPARISON:  June 01, 2015 FINDINGS: Endotracheal tube tip is 1.3 cm above the carina. Central catheter tip is in the superior vena cava. Feeding tube tip is below the diaphragm. No pneumothorax. There is extensive airspace consolidation through much of the right mid and lower lung zones. There is patchy consolidation in the medial left base. Heart size and pulmonary vascularity are normal. No adenopathy. IMPRESSION: Tube and catheter positions as described without pneumothorax. Persistent extensive consolidation throughout much the right lung. There is patchy airspace consolidation in the medial left base, marginally increased from 1 day prior. No change in cardiac silhouette. Electronically Signed   By: Lowella Grip III M.D.   On: 06/02/2015 07:28   Dg Chest Port 1 View  06/01/2015  CLINICAL DATA:  Bilateral pneumonia. EXAM: PORTABLE CHEST 1 VIEW COMPARISON:  May 30, 2015. FINDINGS: Stable cardiomediastinal silhouette. Endotracheal tube is in grossly good position and unchanged. Interval placement  of feeding tube which is seen entering stomach. Right subclavian catheter line is unchanged in position. No pneumothorax is noted. Stable large right lung pneumonia is noted. Stable minimal opacity is seen in left lung base. IMPRESSION: Stable large right lower lobe pneumonia. Endotracheal tube and right subclavian catheter are unchanged. Interval placement of feeding tube seen entering stomach Electronically Signed   By: Marijo Conception, M.D.   On: 06/01/2015 09:30   Dg Chest Port 1 View  05/30/2015  CLINICAL DATA:  Central line placement.  Initial encounter. EXAM: PORTABLE CHEST 1 VIEW COMPARISON:  Chest radiograph and CTA of the chest performed earlier today at 3:01 a.m. FINDINGS: A right subclavian line is noted ending about the cavoatrial junction. The patient's endotracheal tube is seen ending 2-3 cm above the carina. Dense right-sided and mild left basilar airspace opacities are again noted, compatible with multifocal pneumonia. No pleural effusion or pneumothorax is seen. The cardiomediastinal silhouette is normal in size. No acute osseous abnormalities are identified. IMPRESSION: 1. Right subclavian line noted ending about the cavoatrial junction. 2. Endotracheal tube seen ending 2-3 cm above the carina. 3. Bilateral pneumonia, worse on the right. Electronically Signed   By: Garald Balding M.D.   On: 05/30/2015 05:35   Dg Chest Port 1 View  05/30/2015  CLINICAL DATA:  Status post endotracheal tube placement. Initial encounter. EXAM: PORTABLE CHEST 1 VIEW COMPARISON:  Chest radiograph performed earlier today at 12:09 a.m. FINDINGS: The patient's endotracheal tube is seen ending 3-4 cm above the carina. Worsening right-sided pneumonia is  noted. Mild patchy left basilar airspace opacity is also seen. No pleural effusion or pneumothorax is identified. The cardiomediastinal silhouette is borderline normal in size. No acute osseous abnormalities are identified. IMPRESSION: 1. Endotracheal tube seen ending  3-4 cm above the carina. 2. Worsening right-sided pneumonia noted. Mild patchy left basilar airspace opacity also seen. Electronically Signed   By: Garald Balding M.D.   On: 05/30/2015 02:13   Dg Chest Port 1 View  05/30/2015  CLINICAL DATA:  60 year old female with hypoxia. EXAM: PORTABLE CHEST 1 VIEW COMPARISON:  CT dated 08/11/2005 FINDINGS: There is a large area of consolidative change involving right mid and lower lung field with air bronchograms compatible with pneumonia. Underlying mass is not excluded. Clinical correlation and follow-up resolution is recommended. The left lung is clear. No pleural effusion or pneumothorax. Cardiac silhouette is within normal limits. The osseous structures are unremarkable. IMPRESSION: Large right lung consolidative changes as described. Clinical correlation and follow-up to resolution recommended. Electronically Signed   By: Anner Crete M.D.   On: 05/30/2015 00:26   Dg Abd Portable 1v  05/31/2015  CLINICAL DATA:  Feeding tube placement EXAM: PORTABLE ABDOMEN - 1 VIEW COMPARISON:  None. FINDINGS: Feeding tube with the tip projecting over the proximal duodenum. There is no bowel dilatation to suggest obstruction. There is no evidence of pneumoperitoneum, portal venous gas or pneumatosis. There are no pathologic calcifications along the expected course of the ureters. The osseous structures are unremarkable. IMPRESSION: Feeding tube with the tip projecting over the proximal duodenum. Electronically Signed   By: Kathreen Devoid   On: 05/31/2015 13:08   Dg Swallowing Func-speech Pathology  06/09/2015  Objective Swallowing Evaluation: Type of Study: MBS-Modified Barium Swallow Study Patient Details Name: Rasheida Manukyan MRN: [id number] Date of Birth: [birth year] Today's Date: 06/09/2015 Time: SLP Start Time (ACUTE ONLY): 1010-SLP Stop Time (ACUTE ONLY): 1035 SLP Time Calculation (min) (ACUTE ONLY): 25 min Past Medical History: Past Medical History Diagnosis Date .  Hypothyroidism  . Seizures ([place])  . Adenopathy  . Scoliosis  . Spleen anomaly  . Anemia  . Sjogren's disease ([place])  . Raynaud disease  . Shingles  . Osteopenia  Past Surgical History: Past Surgical History Procedure Laterality Date . Tonsillectomy   . Breast cyst aspiration Right 1990's   neg HPI: Ms/ Stater is a 60 y/o woman with a history of Sjogren's disease, hypothyroidism and dysphagia. She was diagnosed with the flu (A) about 4 days PTA and started on tamiflu. 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  Intubated from 2/12 to 2/21.There is a question of esophageal stricture; pt was seen by GI at [facility], complained of globus with pills and some solids, occuring 2-3x a week. Was scheduled for an EGD at [facility], but appears to have cancelled the procedure. 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 No Data Recorded Assessment / Plan / Recommendation CHL IP CLINICAL IMPRESSIONS 06/09/2015 Therapy Diagnosis Moderate cervical esophageal phase dysphagia;Severe pharyngeal phase dysphagia Clinical Impression Pt presents with a severe multifactoral dysphagia with sensory and motor deficits resulting in gross silent aspiration of thin liquids and penetration of nectar. Swallow is delayed, and laryngeal closure is incomplete, likely a result of prolonged intubation. More significant is noteably decreased opening of UES resulting is severe residuals with puree and thickened liquids. There is decreased hyolaryngeal excursion and poor pharyngeal constriction for bolus propulsion. 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 This type of deficit is often seen with Sjogren's disease and would could have played a role in occult aspiration  contributing to pna. Of course, pt is severely deconditioned at this time, has a new CVA, has an NG tube impeding movement of structures and was intubated for 9 days, all of which play a role in pts dysphagia. Recommend pt remain NPO with short term alternate means of nutrition with close monitoring for progress. Will need repeat MBS to determine readiness for PO.  Impact on safety and function Severe aspiration risk   CHL IP TREATMENT RECOMMENDATION 06/09/2015 Treatment Recommendations F/U MBS in --- days (Comment)   Prognosis 06/09/2015 Prognosis for Safe Diet Advancement Fair Barriers to Reach Goals Severity of deficits Barriers/Prognosis Comment -- CHL IP DIET RECOMMENDATION 06/09/2015 SLP Diet Recommendations NPO;Alternative means - temporary Liquid Administration via -- Medication Administration Via alternative means Compensations -- Postural Changes --   CHL IP OTHER RECOMMENDATIONS 06/09/2015 Recommended Consults -- Oral Care Recommendations Oral care QID Other Recommendations --   CHL IP FOLLOW UP RECOMMENDATIONS 06/09/2015 Follow up Recommendations Inpatient Rehab   CHL IP FREQUENCY AND DURATION 06/09/2015 Speech Therapy Frequency (ACUTE ONLY) min 3x week Treatment Duration 2 weeks      CHL IP ORAL PHASE 06/09/2015 Oral Phase WFL Oral - Pudding Teaspoon -- Oral - Pudding Cup -- Oral - Honey Teaspoon -- Oral - Honey Cup -- Oral - Nectar Teaspoon -- Oral - Nectar Cup -- Oral - Nectar Straw -- Oral - Thin Teaspoon -- Oral - Thin Cup -- Oral - Thin Straw -- Oral - Puree -- Oral - Mech Soft -- Oral - Regular -- Oral - Multi-Consistency -- Oral - Pill -- Oral Phase - Comment --  CHL IP PHARYNGEAL PHASE 06/09/2015 Pharyngeal Phase Impaired Pharyngeal- Pudding Teaspoon -- Pharyngeal -- Pharyngeal- Pudding Cup -- Pharyngeal -- Pharyngeal- Honey Teaspoon Delayed swallow initiation-vallecula;Reduced epiglottic inversion;Reduced pharyngeal peristalsis;Reduced anterior laryngeal mobility;Reduced airway/laryngeal  closure;Pharyngeal residue - valleculae;Pharyngeal residue - pyriform Pharyngeal -- Pharyngeal- Honey Cup -- Pharyngeal -- Pharyngeal- Nectar Teaspoon Delayed swallow initiation-vallecula;Reduced epiglottic inversion;Reduced pharyngeal peristalsis;Reduced anterior laryngeal mobility;Reduced airway/laryngeal closure;Pharyngeal residue - valleculae;Pharyngeal residue - pyriform;Penetration/Apiration after swallow;Penetration/Aspiration during swallow;Penetration/Aspiration before swallow Pharyngeal Material enters airway, passes BELOW cords without attempt by patient to eject out (silent aspiration);Material enters airway, CONTACTS cords and not ejected out Pharyngeal- Nectar Cup -- Pharyngeal -- Pharyngeal- Nectar Straw -- Pharyngeal -- Pharyngeal- Thin Teaspoon -- Pharyngeal -- Pharyngeal- Thin Cup Delayed swallow initiation-vallecula;Reduced epiglottic inversion;Reduced pharyngeal peristalsis;Reduced anterior laryngeal mobility;Reduced airway/laryngeal closure;Pharyngeal residue - valleculae;Pharyngeal residue - pyriform;Penetration/Aspiration before swallow;Penetration/Aspiration during swallow;Penetration/Apiration after swallow;Significant aspiration (Amount) Pharyngeal Material enters airway, passes BELOW cords without attempt by patient to eject out (silent aspiration) Pharyngeal- Thin Straw -- Pharyngeal -- Pharyngeal- Puree Delayed swallow initiation-vallecula;Reduced epiglottic inversion;Reduced pharyngeal peristalsis;Reduced anterior laryngeal mobility;Reduced airway/laryngeal closure;Pharyngeal residue - valleculae;Pharyngeal residue - pyriform Pharyngeal -- Pharyngeal- Mechanical Soft -- Pharyngeal -- Pharyngeal- Regular -- Pharyngeal -- Pharyngeal- Multi-consistency -- Pharyngeal -- Pharyngeal- Pill -- Pharyngeal -- Pharyngeal Comment --  CHL IP CERVICAL ESOPHAGEAL PHASE 06/09/2015 Cervical Esophageal Phase Impaired Pudding Teaspoon -- Pudding Cup -- Honey Teaspoon -- Honey Cup -- Nectar Teaspoon --  Nectar Cup -- Nectar Straw -- Thin Teaspoon -- Thin Cup -- Thin Straw -- Puree -- Mechanical Soft -- Regular -- Multi-consistency -- Pill -- Cervical Esophageal Comment  see impression No flowsheet data found. DeBlois, Katherene Ponto 06/09/2015, 11:25 AM              US Abdomen Limited Ruq  06/09/2015  CLINICAL DATA:  Elevated LFTs EXAM: US ABDOMEN LIMITED - RIGHT UPPER QUADRANT COMPARISON:  None. FINDINGS: Gallbladder: Gallstones are identified. No wall thickening or pericholecystic fluid is noted. Negative sonographic Percell Miller sign is noted. Common bile duct: Diameter: 1.9 mm. Liver: No focal lesion identified. Within normal limits in parenchymal echogenicity. Incidental note is made of a small right-sided pleural effusion. IMPRESSION: Cholelithiasis. Right pleural effusion. Electronically Signed   By: Inez Catalina M.D.   On: 06/09/2015 21:50     CBC  Recent Labs Lab 06/07/15 0218 06/08/15 0359 06/09/15 0340 06/10/15 0400 06/11/15 0323  WBC 31.7* 27.6* 24.0* 20.6* 19.9*  HGB 9.7* 9.3* 9.5* 9.2* 8.8*  HCT 31.7* 30.9* 32.0* 30.6* 28.6*  PLT 300 401* 479* 578* 639*  MCV 90.8 92.5 93.6 93.9 96.3  MCH 27.8 27.8 27.8 28.2 29.6  MCHC 30.6 30.1 29.7* 30.1 30.8  RDW 17.8* 18.9* 20.2* 20.6* 21.4*    Chemistries   Recent Labs Lab 06/05/15 0234 06/05/15 2140 06/06/15 0240 06/07/15 0210 06/07/15 0218 06/08/15 0359 06/09/15 0340 06/10/15 0400  NA 138 137 137  --  138 137 137 136  K 4.4 4.0 4.0  --  4.0 4.2 4.3 4.4  CL 105 104 105  --  104 104 100* 104  CO2 25 25 26   --  27 27 25 23   GLUCOSE 175* 143* 132*  --  195* 161* 144* 138*  BUN 21* 22* 23*  --  25* 33* 30* 30*  CREATININE 0.45 0.46 0.45  --  0.52 0.78 0.83 0.76  CALCIUM 8.4* 8.4* 8.3*  --  8.2* 8.2* 8.5* 8.3*  MG 1.9 1.9  --   --   --   --   --   --   AST  --   --   --  149*  --   --  102* 85*  ALT  --   --   --  108*  --   --  76* 65*  ALKPHOS  --   --   --  540*  --   --  563* 515*  BILITOT  --   --   --  0.3  --   --  0.4  0.2*   ------------------------------------------------------------------------------------------------------------------ estimated creatinine clearance is 57.7 mL/min (by C-G formula based on Cr of 0.76). ------------------------------------------------------------------------------------------------------------------ No results for input(s): HGBA1C in the last 72 hours. ------------------------------------------------------------------------------------------------------------------ No results for input(s): CHOL, HDL, LDLCALC, TRIG, CHOLHDL, LDLDIRECT in the last 72 hours. ------------------------------------------------------------------------------------------------------------------ No results for input(s): TSH, T4TOTAL, T3FREE, THYROIDAB in the last 72 hours.  Invalid input(s): FREET3 ------------------------------------------------------------------------------------------------------------------ No results for input(s): VITAMINB12, FOLATE, FERRITIN, TIBC, IRON, RETICCTPCT in the last 72 hours.  Coagulation profile No results for input(s): INR, PROTIME in the last 168 hours.  No results for input(s): DDIMER in the last 72 hours.  Cardiac Enzymes No results for input(s): CKMB, TROPONINI, MYOGLOBIN in the last 168 hours.  Invalid input(s): CK ------------------------------------------------------------------------------------------------------------------ Invalid input(s): POCBNP   CBG:  Recent Labs Lab 06/10/15 1147 06/10/15 1638 06/10/15 2004 06/10/15 2342 06/11/15 0530  GLUCAP 149* 95 109* 117* 141*       Studies: Dg Chest Port 1 View  06/10/2015  CLINICAL DATA:  Respiratory failure and short of breath EXAM: PORTABLE CHEST 1 VIEW COMPARISON:  06/08/2015 FINDINGS: Extensive infiltrate in the right  lung shows progression. No effusion on the right. Mild left lower lobe infiltrate shows mild progression. No effusion on the left. Feeding tube enters the stomach with  the tip not visualized. IMPRESSION: Progression of bilateral infiltrate right greater than left consistent with pneumonia. Electronically Signed   By: Franchot Gallo M.D.   On: 06/10/2015 07:13   Dg Swallowing Func-speech Pathology  06/09/2015  Objective Swallowing Evaluation: Type of Study: MBS-Modified Barium Swallow Study Patient Details Name: [name] [name] MRN: [id number] Date of Birth: 07-17-1955 Today's Date: 06/09/2015 Time: SLP Start Time (ACUTE ONLY): 1010-SLP Stop Time (ACUTE ONLY): 1035 SLP Time Calculation (min) (ACUTE ONLY): 25 min Past Medical History: Past Medical History Diagnosis Date . Hypothyroidism  . Seizures ([place])  . Adenopathy  . Scoliosis  . Spleen anomaly  . Anemia  . Sjogren's disease ([place])  . Raynaud disease  . Shingles  . Osteopenia  Past Surgical History: Past Surgical History Procedure Laterality Date . Tonsillectomy   . Breast cyst aspiration Right 1990's   neg HPI: [name] is a 60 y/o woman with a history of Sjogren's disease, hypothyroidism and dysphagia. She was diagnosed with the flu (A) about 4 days PTA and started on tamiflu. 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  Intubated from 2/12 to 2/21.There is a question of esophageal stricture; pt was seen by GI at [facility], complained of globus with pills and some solids, occuring 2-3x a week. Was scheduled for an EGD at [facility], but appears to have cancelled the procedure. 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 No Data Recorded Assessment / Plan / Recommendation CHL IP CLINICAL IMPRESSIONS 06/09/2015 Therapy Diagnosis Moderate cervical esophageal phase dysphagia;Severe pharyngeal phase dysphagia Clinical Impression Pt presents with a severe multifactoral dysphagia with sensory and motor deficits resulting in gross silent aspiration of thin liquids and penetration of nectar. Swallow is delayed,  and laryngeal closure is incomplete, likely a result of prolonged intubation. More significant is noteably decreased opening of UES resulting is severe residuals with puree and thickened liquids. There is decreased hyolaryngeal excursion and poor pharyngeal constriction for bolus propulsion. 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 This type of deficit is often seen with Sjogren's disease and would could have played a role in occult aspiration contributing to pna. Of course, pt is severely deconditioned at this time, has a new CVA, has an NG tube impeding movement of structures and was intubated for 9 days, all of which play a role in pts dysphagia. Recommend pt remain NPO with short term alternate means of nutrition with close monitoring for progress. Will need repeat MBS to determine readiness for PO.  Impact on safety and function Severe aspiration risk   CHL IP TREATMENT RECOMMENDATION 06/09/2015 Treatment Recommendations F/U MBS in --- days (Comment)   Prognosis 06/09/2015 Prognosis for Safe Diet Advancement Fair Barriers to Reach Goals Severity of deficits Barriers/Prognosis Comment -- CHL IP DIET RECOMMENDATION 06/09/2015 SLP Diet Recommendations NPO;Alternative means - temporary Liquid Administration via -- Medication Administration Via alternative means Compensations -- Postural Changes --   CHL IP OTHER RECOMMENDATIONS 06/09/2015 Recommended Consults -- Oral Care Recommendations Oral care QID Other Recommendations --   CHL IP FOLLOW UP RECOMMENDATIONS 06/09/2015 Follow up Recommendations Inpatient Rehab  CHL IP FREQUENCY AND DURATION 06/09/2015 Speech Therapy Frequency (ACUTE ONLY) min 3x week Treatment Duration 2 weeks      CHL IP ORAL PHASE 06/09/2015 Oral Phase WFL Oral - Pudding Teaspoon -- Oral - Pudding Cup -- Oral - Honey Teaspoon -- Oral - Honey  Cup -- Oral - Nectar Teaspoon -- Oral - Nectar Cup -- Oral - Nectar Straw -- Oral - Thin Teaspoon -- Oral - Thin Cup -- Oral - Thin Straw -- Oral - Puree -- Oral - Mech Soft -- Oral - Regular -- Oral - Multi-Consistency -- Oral - Pill -- Oral Phase - Comment --  CHL IP PHARYNGEAL PHASE 06/09/2015 Pharyngeal Phase Impaired Pharyngeal- Pudding Teaspoon -- Pharyngeal -- Pharyngeal- Pudding Cup -- Pharyngeal -- Pharyngeal- Honey Teaspoon Delayed swallow initiation-vallecula;Reduced epiglottic inversion;Reduced pharyngeal peristalsis;Reduced anterior laryngeal mobility;Reduced airway/laryngeal closure;Pharyngeal residue - valleculae;Pharyngeal residue - pyriform Pharyngeal -- Pharyngeal- Honey Cup -- Pharyngeal -- Pharyngeal- Nectar Teaspoon Delayed swallow initiation-vallecula;Reduced epiglottic inversion;Reduced pharyngeal peristalsis;Reduced anterior laryngeal mobility;Reduced airway/laryngeal closure;Pharyngeal residue - valleculae;Pharyngeal residue - pyriform;Penetration/Apiration after swallow;Penetration/Aspiration during swallow;Penetration/Aspiration before swallow Pharyngeal Material enters airway, passes BELOW cords without attempt by patient to eject out (silent aspiration);Material enters airway, CONTACTS cords and not ejected out Pharyngeal- Nectar Cup -- Pharyngeal -- Pharyngeal- Nectar Straw -- Pharyngeal -- Pharyngeal- Thin Teaspoon -- Pharyngeal -- Pharyngeal- Thin Cup Delayed swallow initiation-vallecula;Reduced epiglottic inversion;Reduced pharyngeal peristalsis;Reduced anterior laryngeal mobility;Reduced airway/laryngeal closure;Pharyngeal residue - valleculae;Pharyngeal residue - pyriform;Penetration/Aspiration before swallow;Penetration/Aspiration during swallow;Penetration/Apiration after swallow;Significant aspiration (Amount) Pharyngeal Material enters airway, passes BELOW cords without attempt by patient to eject out (silent aspiration) Pharyngeal- Thin Straw -- Pharyngeal -- Pharyngeal- Puree  Delayed swallow initiation-vallecula;Reduced epiglottic inversion;Reduced pharyngeal peristalsis;Reduced anterior laryngeal mobility;Reduced airway/laryngeal closure;Pharyngeal residue - valleculae;Pharyngeal residue - pyriform Pharyngeal -- Pharyngeal- Mechanical Soft -- Pharyngeal -- Pharyngeal- Regular -- Pharyngeal -- Pharyngeal- Multi-consistency -- Pharyngeal -- Pharyngeal- Pill -- Pharyngeal -- Pharyngeal Comment --  CHL IP CERVICAL ESOPHAGEAL PHASE 06/09/2015 Cervical Esophageal Phase Impaired Pudding Teaspoon -- Pudding Cup -- Honey Teaspoon -- Honey Cup -- Nectar Teaspoon -- Nectar Cup -- Nectar Straw -- Thin Teaspoon -- Thin Cup -- Thin Straw -- Puree -- Mechanical Soft -- Regular -- Multi-consistency -- Pill -- Cervical Esophageal Comment see impression No flowsheet data found. DeBlois, Katherene Ponto 06/09/2015, 11:25 AM              US Abdomen Limited Ruq  06/09/2015  CLINICAL DATA:  Elevated LFTs EXAM: US ABDOMEN LIMITED - RIGHT UPPER QUADRANT COMPARISON:  None. FINDINGS: Gallbladder: Gallstones are identified. No wall thickening or pericholecystic fluid is noted. Negative sonographic Percell Miller sign is noted. Common bile duct: Diameter: 1.9 mm. Liver: No focal lesion identified. Within normal limits in parenchymal echogenicity. Incidental note is made of a small right-sided pleural effusion. IMPRESSION: Cholelithiasis. Right pleural effusion. Electronically Signed   By: Inez Catalina M.D.   On: 06/09/2015 21:50      Lab Results  Component Value Date   HGBA1C 6.6* 06/04/2015   Lab Results  Component Value Date   LDLCALC 65 06/03/2015   CREATININE 0.76 06/10/2015       Scheduled Meds: . antiseptic oral rinse  7 mL Mouth Rinse QID  . atorvastatin  40 mg Oral q1800  . cefTRIAXone (ROCEPHIN)  IV  2 g Intravenous Q12H  . chlorhexidine gluconate  15 mL Mouth Rinse BID  . furosemide  20 mg Intravenous Daily  . insulin aspart  0-15 Units Subcutaneous 6 times per day  . insulin glargine  10 Units Subcutaneous Daily  . levothyroxine  100 mcg Oral QAC breakfast  . metoprolol tartrate  12.5 mg Per Tube BID  . pantoprazole sodium  40 mg Per Tube Q1200  . potassium chloride  40 mEq Oral Daily  . sodium chloride flush  10-40 mL Intracatheter Q12H   Continuous Infusions: . sodium chloride 10 mL/hr at 06/08/15 0842  . feeding supplement (VITAL AF 1.2 CAL) 1,000 mL (06/10/15 1900)  . heparin 1,050 Units/hr (06/10/15 1900)    Active Problems:   Sepsis ([place])   CAP (community acquired pneumonia)   Encephalopathy   Encounter for central line placement   Hypoxia   Lactic acidosis   Altered mental state   Weakness   Encounter for feeding tube placement   Endotracheal tube present   Endotracheally intubated   Respiratory failure ([place])   Stroke (cerebrum) ([place])   Research study patient   Encounter for diagnostic procedure   Ventilator dependence ([place])   Carotid thromboses   HLD (hyperlipidemia)   Acute respiratory failure ([place])    Time spent: 45 minutes   [facility] Pager [phone]. If 7PM-7AM, please contact night-coverage at [email or web address], password [facility] 06/11/2015, 7:51 AM  LOS: 12 days

## 2015-06-12 DIAGNOSIS — R319 Hematuria, unspecified: Secondary | ICD-10-CM

## 2015-06-12 DIAGNOSIS — R6521 Severe sepsis with septic shock: Secondary | ICD-10-CM

## 2015-06-12 DIAGNOSIS — A419 Sepsis, unspecified organism: Secondary | ICD-10-CM | POA: Diagnosis present

## 2015-06-12 DIAGNOSIS — B954 Other streptococcus as the cause of diseases classified elsewhere: Secondary | ICD-10-CM | POA: Diagnosis present

## 2015-06-12 DIAGNOSIS — I6522 Occlusion and stenosis of left carotid artery: Secondary | ICD-10-CM

## 2015-06-12 DIAGNOSIS — I639 Cerebral infarction, unspecified: Secondary | ICD-10-CM | POA: Insufficient documentation

## 2015-06-12 DIAGNOSIS — K222 Esophageal obstruction: Secondary | ICD-10-CM

## 2015-06-12 DIAGNOSIS — A491 Streptococcal infection, unspecified site: Secondary | ICD-10-CM | POA: Diagnosis present

## 2015-06-12 DIAGNOSIS — R1310 Dysphagia, unspecified: Secondary | ICD-10-CM

## 2015-06-12 LAB — CBC WITH DIFFERENTIAL/PLATELET
BASOS PCT: 1 %
Basophils Absolute: 0.1 10*3/uL (ref 0.0–0.1)
EOS PCT: 0 %
Eosinophils Absolute: 0 10*3/uL (ref 0.0–0.7)
HEMATOCRIT: 32 % — AB (ref 36.0–46.0)
Hemoglobin: 10.1 g/dL — ABNORMAL LOW (ref 12.0–15.0)
LYMPHS ABS: 2.6 10*3/uL (ref 0.7–4.0)
Lymphocytes Relative: 19 %
MCH: 29.4 pg (ref 26.0–34.0)
MCHC: 31.6 g/dL (ref 30.0–36.0)
MCV: 93.3 fL (ref 78.0–100.0)
MONOS PCT: 14 %
Monocytes Absolute: 1.9 10*3/uL — ABNORMAL HIGH (ref 0.1–1.0)
Neutro Abs: 9.2 10*3/uL — ABNORMAL HIGH (ref 1.7–7.7)
Neutrophils Relative %: 66 %
Platelets: 821 10*3/uL — ABNORMAL HIGH (ref 150–400)
RBC: 3.43 MIL/uL — AB (ref 3.87–5.11)
RDW: 20.9 % — AB (ref 11.5–15.5)
WBC: 13.8 10*3/uL — AB (ref 4.0–10.5)

## 2015-06-12 LAB — CBC
HEMATOCRIT: 28.6 % — AB (ref 36.0–46.0)
Hemoglobin: 8.5 g/dL — ABNORMAL LOW (ref 12.0–15.0)
MCH: 28.1 pg (ref 26.0–34.0)
MCHC: 29.7 g/dL — ABNORMAL LOW (ref 30.0–36.0)
MCV: 94.7 fL (ref 78.0–100.0)
Platelets: 726 10*3/uL — ABNORMAL HIGH (ref 150–400)
RBC: 3.02 MIL/uL — AB (ref 3.87–5.11)
RDW: 21.1 % — ABNORMAL HIGH (ref 11.5–15.5)
WBC: 12.1 10*3/uL — AB (ref 4.0–10.5)

## 2015-06-12 LAB — COMPREHENSIVE METABOLIC PANEL
ALK PHOS: 472 U/L — AB (ref 38–126)
ALT: 56 U/L — AB (ref 14–54)
AST: 76 U/L — AB (ref 15–41)
Albumin: 1.6 g/dL — ABNORMAL LOW (ref 3.5–5.0)
Anion gap: 7 (ref 5–15)
BILIRUBIN TOTAL: 0.5 mg/dL (ref 0.3–1.2)
BUN: 29 mg/dL — AB (ref 6–20)
CALCIUM: 8.5 mg/dL — AB (ref 8.9–10.3)
CHLORIDE: 102 mmol/L (ref 101–111)
CO2: 28 mmol/L (ref 22–32)
CREATININE: 0.52 mg/dL (ref 0.44–1.00)
Glucose, Bld: 126 mg/dL — ABNORMAL HIGH (ref 65–99)
Potassium: 4.2 mmol/L (ref 3.5–5.1)
Sodium: 137 mmol/L (ref 135–145)
TOTAL PROTEIN: 6.4 g/dL — AB (ref 6.5–8.1)

## 2015-06-12 LAB — GLUCOSE, CAPILLARY
GLUCOSE-CAPILLARY: 102 mg/dL — AB (ref 65–99)
GLUCOSE-CAPILLARY: 126 mg/dL — AB (ref 65–99)
Glucose-Capillary: 137 mg/dL — ABNORMAL HIGH (ref 65–99)
Glucose-Capillary: 149 mg/dL — ABNORMAL HIGH (ref 65–99)
Glucose-Capillary: 100 mg/dL — ABNORMAL HIGH (ref 65–99)

## 2015-06-12 NOTE — Progress Notes (Signed)
Pt transferred to room 6E15 from [facility] via bed.  Pt alert and oriented X 4.  VSS. Denies pain. Pt NPO with Panda tube @ 55 ml/hr.  CBG 100 on arrival. Tele # 15 SR verified X 2.

## 2015-06-12 NOTE — Progress Notes (Signed)
Report called to Otila Kluver, RN  6E receiving nurse. Pt being transferred to 6E15. Husband Romya Fincke updated.

## 2015-06-12 NOTE — Progress Notes (Signed)
[facility] Progress Note  Patient: [name] [name] [id number]   PCP: No primary care provider on file. DOB: 03-20-1956   DOA: 05/29/2015   DOS: 06/12/2015   Date of Service: the patient was seen and examined on 06/12/2015  Subjective: Patient does not have any significant hematuria. Hemoglobin has dropped 0.5 g. No chest pain or shortness of breath. Complains of her feet Nutrition: Tolerating tube feeding Activity: Mostly bedridden at present Last BM: 06/12/2015  Assessment and Plan: 1. Severe sepsis with septic shock (HCC)  Streptococcus pyogenes bacteremia Aspect of aspiration pneumonia and influenza A  The patient presented with respiratory failure due to influenza A as well as community acquired pneumonia. Required ICU admission, intubation, pressors. Currently hemodynamically stable. Completed Tamiflu treatment in ICU. Patient will be on 42 days of ceftriaxone cervical spine as bacteremia. Unable to complete TEE due to esophageal stricture.  2. Dysphagia, acute CVA. 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 Initially was started on anticoagulation which has been on hold due to hematuria. Recheck H&H as no further evidence of hematuria is identified.  Continue aspirin rectally. Weeks on hold for bacteria. Neurology has recommended hypercoagulable workup. Due to dysphagia the patient is currently on tube feeding and is scheduled for PEG tube placement on Monday. Neurology recommends that the patient can be transferred to aspirin and Plavix once she has a PEG tube placement Repeat EEG on Monday  3. Hematuria. We'll monitor closely. No evidence of significant hemodynamic bleed.  4. Type 2 diabetes mellitus. Hemoglobin A1c 6.6. Continue into feeding. Continue insulin sliding scale.  5. Dyslipidemia. Continuing Lipitor.  6. Protein calorie malnutrition. Next on patient is into feeding at present. Schedule for PEG tube placement on Monday. Tolerating well.  7. Volume  overload. Next and patient was given significant IV fluids in the hospitalization currently on Lasix per Critical care. Monitor ins and outs. Daily weight  8. Hypothyroidism. Sick euthyroid syndrome. Continue Synthroid.  9. Candidiasis. Patient has completed a 3 day treatment with fluconazole.   DVT Prophylaxis: subcutaneous Heparin. Nutrition: on tube feeding Advance goals of care discussion: full code  Brief Summary of Hospitalization:  Procedures: EEG, ECHO,  Consultants: PCCM primary admission, infectious disease, neurology Antibiotics: Anti-infectives    Start     Dose/Rate Route Frequency Ordered Stop   06/06/15 1200  fluconazole (DIFLUCAN) 40 MG/ML suspension 100 mg     100 mg Per Tube Daily 06/06/15 1134 06/08/15 1035   06/03/15 1045  cefTRIAXone (ROCEPHIN) 2 g in dextrose 5 % 50 mL IVPB  Status:  Discontinued     2 g 100 mL/hr over 30 Minutes Intravenous Every 24 hours 06/03/15 0924 06/03/15 0932   06/03/15 1000  cefTRIAXone (ROCEPHIN) 2 g in dextrose 5 % 50 mL IVPB     2 g 100 mL/hr over 30 Minutes Intravenous Every 12 hours 06/03/15 0932 07/11/15 2359   06/02/15 1045  cefTRIAXone (ROCEPHIN) 1 g in dextrose 5 % 50 mL IVPB  Status:  Discontinued     1 g 100 mL/hr over 30 Minutes Intravenous Every 24 hours 06/02/15 1040 06/03/15 0924   06/01/15 0000  vancomycin (VANCOCIN) IVPB 1000 mg/200 mL premix  Status:  Discontinued     1,000 mg 200 mL/hr over 60 Minutes Intravenous Every 12 hours 05/31/15 1340 06/02/15 1040   05/31/15 2200  oseltamivir (TAMIFLU) 6 MG/ML suspension 150 mg  Status:  Discontinued     150 mg Per Tube 2 times daily 05/31/15 1111 05/31/15 1421   05/31/15 1430  oseltamivir (TAMIFLU) 6 MG/ML suspension 150 mg     150 mg Per Tube 2 times daily 05/31/15 1421 06/06/15 2102   05/31/15 1400  clindamycin (CLEOCIN) IVPB 600 mg     600 mg 100 mL/hr over 30 Minutes Intravenous 3 times per day 05/31/15 1335 06/01/15 2202   05/30/15 2300  levofloxacin (LEVAQUIN)  IVPB 750 mg  Status:  Discontinued     750 mg 100 mL/hr over 90 Minutes Intravenous Every 24 hours 05/30/15 0745 06/02/15 1040   05/30/15 1300  oseltamivir (TAMIFLU) 6 MG/ML suspension 75 mg  Status:  Discontinued     75 mg Per Tube 2 times daily 05/30/15 1252 05/31/15 1111   05/30/15 1200  vancomycin (VANCOCIN) 500 mg in sodium chloride 0.9 % 100 mL IVPB  Status:  Discontinued     500 mg 100 mL/hr over 60 Minutes Intravenous Every 12 hours 05/30/15 0745 05/31/15 1340   05/30/15 0800  acyclovir (ZOVIRAX) 500 mg in dextrose 5 % 100 mL IVPB  Status:  Discontinued     500 mg 110 mL/hr over 60 Minutes Intravenous 3 times per day 05/30/15 0725 05/30/15 1245   05/30/15 0800  aztreonam (AZACTAM) 2 g in dextrose 5 % 50 mL IVPB  Status:  Discontinued     2 g 100 mL/hr over 30 Minutes Intravenous Every 8 hours 05/30/15 0745 05/31/15 1331   05/30/15 0030  acyclovir (ZOVIRAX) 500 mg in dextrose 5 % 100 mL IVPB  Status:  Discontinued     500 mg 110 mL/hr over 60 Minutes Intravenous  Once 05/30/15 0016 05/30/15 0902   05/30/15 0015  levofloxacin (LEVAQUIN) IVPB 750 mg     750 mg 100 mL/hr over 90 Minutes Intravenous  Once 05/30/15 0001 05/30/15 0232   05/30/15 0015  aztreonam (AZACTAM) 2 g in dextrose 5 % 50 mL IVPB     2 g 100 mL/hr over 30 Minutes Intravenous  Once 05/30/15 0001 05/30/15 0115   05/30/15 0015  vancomycin (VANCOCIN) IVPB 1000 mg/200 mL premix     1,000 mg 200 mL/hr over 60 Minutes Intravenous  Once 05/30/15 0001 05/30/15 0232       Family Communication: family was present at bedside, at the time of interview.  Opportunity was given to ask question and all questions were answered satisfactorily.   Disposition:  Barriers to safe discharge: CIR placement, and PEG tube, hematuria   Intake/Output Summary (Last 24 hours) at 06/12/15 1313 Last data filed at 06/12/15 0907  Gross per 24 hour  Intake   1480 ml  Output      0 ml  Net   1480 ml   Filed Weights   06/10/15 2339  06/11/15 0500 06/12/15 0436  Weight: 58.242 kg (128 lb 6.4 oz) 58.242 kg (128 lb 6.4 oz) 58.9 kg (129 lb 13.6 oz)    Objective: Physical Exam: Filed Vitals:   06/12/15 0436 06/12/15 0803 06/12/15 1000 06/12/15 1200  BP:  126/64 121/66 114/58  Pulse:  82 77 77  Temp:  97.6 F (36.4 C)  97.6 F (36.4 C)  TempSrc:  Axillary  Oral  Resp:  26 22 19   Height:      Weight: 58.9 kg (129 lb 13.6 oz)     SpO2:  99% 100% 98%     General: Appear in mild distress, no Rash; Oral Mucosa moist. Cardiovascular: S1 and S2 Present, no Murmur, no JVD Respiratory: Bilateral Air entry present and bilateral Crackles, no wheezes Abdomen: Bowel Sound present,  Soft and no tenderness Extremities: no Pedal edema, no calf tenderness Neurology: Grossly no focal neuro deficit.  Data Reviewed: CBC:  Recent Labs Lab 06/09/15 0340 06/10/15 0400 06/11/15 0323 06/11/15 1340 06/12/15 0500  WBC 24.0* 20.6* 19.9* 17.2* 12.1*  HGB 9.5* 9.2* 8.8* 9.0* 8.5*  HCT 32.0* 30.6* 28.6* 29.6* 28.6*  MCV 93.6 93.9 96.3 93.7 94.7  PLT 479* 578* 639* 716* [id number]*   Basic Metabolic Panel:  Recent Labs Lab 06/05/15 2140  06/07/15 0218 06/08/15 0359 06/09/15 0340 06/10/15 0400 06/12/15 0500  NA 137  < > 138 137 137 136 137  K 4.0  < > 4.0 4.2 4.3 4.4 4.2  CL 104  < > 104 104 100* 104 102  CO2 25  < > 27 27 25 23 28   GLUCOSE 143*  < > 195* 161* 144* 138* 126*  BUN 22*  < > 25* 33* 30* 30* 29*  CREATININE 0.46  < > 0.52 0.78 0.83 0.76 0.52  CALCIUM 8.4*  < > 8.2* 8.2* 8.5* 8.3* 8.5*  MG 1.9  --   --   --   --   --   --   < > = values in this interval not displayed. Liver Function Tests:  Recent Labs Lab 06/07/15 0210 06/09/15 0340 06/10/15 0400 06/12/15 0500  AST 149* 102* 85* 76*  ALT 108* 76* 65* 56*  ALKPHOS 540* 563* 515* 472*  BILITOT 0.3 0.4 0.2* 0.5  PROT 6.5 6.6 6.3* 6.4*  ALBUMIN 1.7* 1.7* 1.7* 1.6*   No results for input(s): LIPASE, AMYLASE in the last 168 hours. No results for input(s):  AMMONIA in the last 168 hours.  Cardiac Enzymes: No results for input(s): CKTOTAL, CKMB, CKMBINDEX, TROPONINI in the last 168 hours.  BNP (last 3 results)  Recent Labs  05/30/15 0009  BNP 149.0*    CBG:  Recent Labs Lab 06/11/15 1705 06/11/15 1942 06/12/15 0014 06/12/15 0340 06/12/15 0801  GLUCAP 139* 118* 126* 102* 137*    Recent Results (from the past 240 hour(s))  Culture, blood (routine x 2)     Status: None   Collection Time: 06/02/15  1:24 PM  Result Value Ref Range Status   Specimen Description BLOOD RIGHT ARM  Final   Special Requests BOTTLES DRAWN AEROBIC ONLY 5CC  Final   Culture NO GROWTH 5 DAYS  Final   Report Status 06/07/2015 FINAL  Final  Culture, blood (routine x 2)     Status: None   Collection Time: 06/02/15  1:26 PM  Result Value Ref Range Status   Specimen Description BLOOD RIGHT ARM  Final   Special Requests BOTTLES DRAWN AEROBIC ONLY 5CC  Final   Culture NO GROWTH 5 DAYS  Final   Report Status 06/07/2015 FINAL  Final  Culture, respiratory (NON-Expectorated)     Status: None   Collection Time: 06/03/15  4:30 PM  Result Value Ref Range Status   Specimen Description BRONCHIAL ALVEOLAR LAVAGE  Final   Special Requests NONE  Final   Gram Stain   Final    ABUNDANT WBC PRESENT, PREDOMINANTLY PMN NO SQUAMOUS EPITHELIAL CELLS SEEN NO ORGANISMS SEEN Performed at [facility]    Culture   Final    NO GROWTH 2 DAYS Performed at [facility]    Report Status 06/06/2015 FINAL  Final  Culture, respiratory (NON-Expectorated)     Status: None   Collection Time: 06/08/15  9:40 AM  Result Value Ref Range Status   Specimen  Description TRACHEAL ASPIRATE  Final   Special Requests Immunocompromised  Final   Gram Stain   Final    ABUNDANT WBC PRESENT, PREDOMINANTLY PMN NO SQUAMOUS EPITHELIAL CELLS SEEN NO ORGANISMS SEEN Performed at [facility]    Culture   Final    FEW YEAST CONSISTENT WITH CANDIDA SPECIES Performed at  [facility]    Report Status 06/11/2015 FINAL  Final     Studies: No results found.   Scheduled Meds: . antiseptic oral rinse  7 mL Mouth Rinse QID  . aspirin  300 mg Rectal Daily  . atorvastatin  40 mg Oral q1800  . cefTRIAXone (ROCEPHIN)  IV  2 g Intravenous Q12H  . chlorhexidine gluconate  15 mL Mouth Rinse BID  . [START ON 06/15/2015] enoxaparin (LOVENOX) injection  40 mg Subcutaneous Q24H  . furosemide  20 mg Intravenous Daily  . insulin aspart  0-15 Units Subcutaneous 6 times per day  . insulin glargine  10 Units Subcutaneous Daily  . levothyroxine  100 mcg Oral QAC breakfast  . metoprolol tartrate  12.5 mg Per Tube BID  . pantoprazole sodium  40 mg Per Tube Q1200  . potassium chloride  40 mEq Oral Daily  . sodium chloride flush  10-40 mL Intracatheter Q12H   Continuous Infusions: . sodium chloride 10 mL/hr at 06/08/15 0842  . feeding supplement (VITAL AF 1.2 CAL) 1,000 mL (06/11/15 1029)   PRN Meds: sodium chloride, acetaminophen (TYLENOL) oral liquid 160 mg/5 mL, Gerhardt's butt cream, metoprolol, sodium chloride flush  Time spent: 3 minutes  Author: Berle Mull, MD [facility] Pager: [phone] 06/12/2015 1:13 PM  If 7PM-7AM, please contact night-coverage at [email or web address], password [facility]

## 2015-06-13 DIAGNOSIS — K222 Esophageal obstruction: Secondary | ICD-10-CM

## 2015-06-13 LAB — CBC WITH DIFFERENTIAL/PLATELET
BASOS ABS: 0.1 10*3/uL (ref 0.0–0.1)
Basophils Relative: 1 %
Eosinophils Absolute: 0.1 10*3/uL (ref 0.0–0.7)
Eosinophils Relative: 1 %
HEMATOCRIT: 30.2 % — AB (ref 36.0–46.0)
HEMOGLOBIN: 9.4 g/dL — AB (ref 12.0–15.0)
LYMPHS ABS: 2.1 10*3/uL (ref 0.7–4.0)
LYMPHS PCT: 17 %
MCH: 28.9 pg (ref 26.0–34.0)
MCHC: 31.1 g/dL (ref 30.0–36.0)
MCV: 92.9 fL (ref 78.0–100.0)
Monocytes Absolute: 2.2 10*3/uL — ABNORMAL HIGH (ref 0.1–1.0)
Monocytes Relative: 18 %
NEUTROS ABS: 7.9 10*3/uL — AB (ref 1.7–7.7)
Neutrophils Relative %: 63 %
Platelets: 870 10*3/uL — ABNORMAL HIGH (ref 150–400)
RBC: 3.25 MIL/uL — AB (ref 3.87–5.11)
RDW: 20.9 % — ABNORMAL HIGH (ref 11.5–15.5)
WBC: 12.4 10*3/uL — ABNORMAL HIGH (ref 4.0–10.5)

## 2015-06-13 LAB — BASIC METABOLIC PANEL
ANION GAP: 10 (ref 5–15)
BUN: 49 mg/dL — AB (ref 6–20)
CHLORIDE: 100 mmol/L — AB (ref 101–111)
CO2: 26 mmol/L (ref 22–32)
Calcium: 8.6 mg/dL — ABNORMAL LOW (ref 8.9–10.3)
Creatinine, Ser: 1.5 mg/dL — ABNORMAL HIGH (ref 0.44–1.00)
GFR calc Af Amer: 42 mL/min — ABNORMAL LOW (ref >60)
GFR calc non Af Amer: 36 mL/min — ABNORMAL LOW (ref >60)
GLUCOSE: 144 mg/dL — AB (ref 65–99)
POTASSIUM: 4.4 mmol/L (ref 3.5–5.1)
Sodium: 136 mmol/L (ref 135–145)

## 2015-06-13 LAB — GLUCOSE, CAPILLARY
GLUCOSE-CAPILLARY: 110 mg/dL — AB (ref 65–99)
GLUCOSE-CAPILLARY: 113 mg/dL — AB (ref 65–99)
GLUCOSE-CAPILLARY: 118 mg/dL — AB (ref 65–99)
GLUCOSE-CAPILLARY: 134 mg/dL — AB (ref 65–99)
Glucose-Capillary: 109 mg/dL — ABNORMAL HIGH (ref 65–99)
Glucose-Capillary: 140 mg/dL — ABNORMAL HIGH (ref 65–99)
Glucose-Capillary: 145 mg/dL — ABNORMAL HIGH (ref 65–99)

## 2015-06-13 LAB — MAGNESIUM: Magnesium: 2 mg/dL (ref 1.7–2.4)

## 2015-06-13 MED ORDER — BIOTENE DRY MOUTH MT LIQD
15.0000 mL | OROMUCOSAL | Status: DC | PRN
  Administered 2015-06-14: 15 mL via OROMUCOSAL
  Filled 2015-06-13: qty 15

## 2015-06-13 MED ORDER — LEVOTHYROXINE SODIUM 100 MCG IV SOLR
50.0000 ug | Freq: Every day | INTRAVENOUS | Status: AC
  Administered 2015-06-13: 50 ug via INTRAVENOUS
  Filled 2015-06-13: qty 5

## 2015-06-13 NOTE — Progress Notes (Signed)
[facility] Progress Note  Patient: [name] [name] [id number]   PCP: No primary care provider on file. DOB: October 27, 1955   DOA: 05/29/2015   DOS: 06/13/2015   Date of Service: the patient was seen and examined on 06/13/2015  Subjective: Patient complain some dry mouth. No nausea no vomiting. No diarrhea. No active bleeding and no hematuria reported. Nutrition: Tolerating tube feeding Activity: Mostly bedridden at present Last BM: 06/13/2015  Assessment and Plan: 1. Severe sepsis with septic shock (HCC)  Streptococcus pyogenes bacteremia Aspect of aspiration pneumonia and influenza A  The patient presented with respiratory failure due to influenza A as well as community acquired pneumonia. Required ICU admission, intubation, pressors. Currently hemodynamically stable. Completed Tamiflu treatment in ICU. Patient will be on 42 days of ceftriaxone Streptococcus pyrogens bacteremia. Unable to complete TEE due to esophageal stricture.  2. Dysphagia, acute CVA. 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 Recheck H&H as no further evidence of hematuria is identified.  Continue aspirin rectally.  Neurology has recommended hypercoagulable workup. Due to dysphagia the patient is currently on tube feeding and is scheduled for PEG tube placement on Monday. Neurology recommends that the patient can be transferred to aspirin and Plavix once she has a PEG tube placement, and does not need anticoagulation Repeat EEG on Monday  3. Hematuria. H&H is stable. No evidence of hematuria. Continue to hold anticoagulation for therapeutic purposes. Continue DVT prophylaxis.  4. Type 2 diabetes mellitus. Hemoglobin A1c 6.6. Continue tube feeding. Continue insulin sliding scale.  5. Dyslipidemia. Continuing Lipitor.  6. Protein calorie malnutrition.  patient is getting tube feeding at  present. Schedule for PEG tube placement on Monday. Tolerating well.  7. Volume overload. patient was given significant IV fluids in the hospitalization Currently appears euvolemic therefore I will discontinue Lasix. Monitor ins and outs. Daily weight  8. Hypothyroidism. Sick euthyroid syndrome. Continue Synthroid.  9. Candidiasis. Patient has completed a 3 day treatment with fluconazole.  10. Acute kidney injury. Elevation of renal function urine output is adequate. Discontinue Lasix.  11. Dry mouth. Sjogren's syndrome. Oral Per nursing  DVT Prophylaxis: subcutaneous Heparin. Nutrition: on tube feeding Advance goals of care discussion: full code  Brief Summary of Hospitalization:  Procedures: EEG, ECHO,  Consultants: PCCM primary admission, infectious disease, neurology Antibiotics: Anti-infectives    Start     Dose/Rate Route Frequency Ordered Stop   06/06/15 1200  fluconazole (DIFLUCAN) 40 MG/ML suspension 100 mg     100 mg Per Tube Daily 06/06/15 1134 06/08/15 1035   06/03/15 1045  cefTRIAXone (ROCEPHIN) 2 g in dextrose 5 % 50 mL IVPB  Status:  Discontinued     2 g 100 mL/hr over 30 Minutes Intravenous Every 24 hours 06/03/15 0924 06/03/15 0932   06/03/15 1000  cefTRIAXone (ROCEPHIN) 2 g in dextrose 5 % 50 mL IVPB     2 g 100 mL/hr over 30 Minutes Intravenous Every 12 hours 06/03/15 0932 07/11/15 2359   06/02/15 1045  cefTRIAXone (ROCEPHIN) 1 g in dextrose 5 % 50 mL IVPB  Status:  Discontinued     1 g 100 mL/hr over 30 Minutes Intravenous Every 24 hours 06/02/15 1040 06/03/15 0924   06/01/15 0000  vancomycin (VANCOCIN) IVPB 1000 mg/200 mL premix  Status:  Discontinued     1,000 mg 200 mL/hr over 60 Minutes Intravenous Every 12 hours 05/31/15 1340 06/02/15 1040   05/31/15 2200  oseltamivir (TAMIFLU) 6 MG/ML suspension 150 mg  Status:  Discontinued     150 mg Per Tube 2 times daily 05/31/15 1111 05/31/15 1421   05/31/15 1430  oseltamivir (TAMIFLU) 6 MG/ML  suspension 150 mg     150 mg Per Tube 2 times daily 05/31/15 1421 06/06/15 2102   05/31/15 1400  clindamycin (CLEOCIN) IVPB 600 mg     600 mg 100 mL/hr over 30 Minutes Intravenous 3 times per day 05/31/15 1335 06/01/15 2202   05/30/15 2300  levofloxacin (LEVAQUIN) IVPB 750 mg  Status:  Discontinued     750 mg 100 mL/hr over 90 Minutes Intravenous Every 24 hours 05/30/15 0745 06/02/15 1040   05/30/15 1300  oseltamivir (TAMIFLU) 6 MG/ML suspension 75 mg  Status:  Discontinued     75 mg Per Tube 2 times daily 05/30/15 1252 05/31/15 1111   05/30/15 1200  vancomycin (VANCOCIN) 500 mg in sodium chloride 0.9 % 100 mL IVPB  Status:  Discontinued     500 mg 100 mL/hr over 60 Minutes Intravenous Every 12 hours 05/30/15 0745 05/31/15 1340   05/30/15 0800  acyclovir (ZOVIRAX) 500 mg in dextrose 5 % 100 mL IVPB  Status:  Discontinued     500 mg 110 mL/hr over 60 Minutes Intravenous 3 times per day 05/30/15 0725 05/30/15 1245   05/30/15 0800  aztreonam (AZACTAM) 2 g in dextrose 5 % 50 mL IVPB  Status:  Discontinued     2 g 100 mL/hr over 30 Minutes Intravenous Every 8 hours 05/30/15 0745 05/31/15 1331   05/30/15 0030  acyclovir (ZOVIRAX) 500 mg in dextrose 5 % 100 mL IVPB  Status:  Discontinued     500 mg 110 mL/hr over 60 Minutes Intravenous  Once 05/30/15 0016 05/30/15 0902   05/30/15 0015  levofloxacin (LEVAQUIN) IVPB 750 mg     750 mg 100 mL/hr over 90 Minutes Intravenous  Once 05/30/15 0001 05/30/15 0232   05/30/15 0015  aztreonam (AZACTAM) 2 g in dextrose 5 % 50 mL IVPB     2 g 100 mL/hr over 30 Minutes Intravenous  Once 05/30/15 0001 05/30/15 0115   05/30/15 0015  vancomycin (VANCOCIN) IVPB 1000 mg/200 mL premix     1,000 mg 200 mL/hr over 60 Minutes Intravenous  Once 05/30/15 0001 05/30/15 0232      Family Communication: family was present at bedside, at the time of interview.  Opportunity was given to ask question and all questions were answered satisfactorily.   Disposition:   Barriers to safe discharge: CIR placement, and PEG tube, hematuria   Intake/Output Summary (Last 24 hours) at 06/13/15 1121 Last data filed at 06/13/15 0842  Gross per 24 hour  Intake    350 ml  Output    100 ml  Net    250 ml   Filed Weights   06/11/15 0500 06/12/15 0436 06/13/15 0500  Weight: 58.242 kg (128 lb 6.4 oz) 58.9 kg (129 lb 13.6 oz) 56.291 kg (124 lb 1.6 oz)   Objective: Physical Exam: Filed Vitals:   06/12/15 2100 06/13/15 0115 06/13/15 0500 06/13/15 0842  BP: 132/68 120/55 114/60 102/57  Pulse: 88 90 88 98  Temp: 98.4 F (36.9 C)  97.7 F (36.5 C) 97.7 F (36.5 C)  TempSrc: Oral  Oral Oral  Resp: 20  20 19   Height:      Weight:   56.291 kg (124 lb 1.6 oz)   SpO2: 100%  100% 95%    General: Appear  in mild distress, no Rash; Oral Mucosa moist. Cardiovascular: S1 and S2 Present, no Murmur, no JVD Respiratory: Bilateral Air entry present and bilateral Crackles, no wheezes Abdomen: Bowel Sound present, Soft and no tenderness Extremities: no Pedal edema, no calf tenderness  Data Reviewed: CBC:  Recent Labs Lab 06/11/15 0323 06/11/15 1340 06/12/15 0500 06/12/15 1608 06/13/15 0507  WBC 19.9* 17.2* 12.1* 13.8* 12.4*  NEUTROABS  --   --   --  9.2* 7.9*  HGB 8.8* 9.0* 8.5* 10.1* 9.4*  HCT 28.6* 29.6* 28.6* 32.0* 30.2*  MCV 96.3 93.7 94.7 93.3 92.9  PLT 639* 716* 726* 821* 123XX123*   Basic Metabolic Panel:  Recent Labs Lab 06/08/15 0359 06/09/15 0340 06/10/15 0400 06/12/15 0500 06/13/15 0507  NA 137 137 136 137 136  K 4.2 4.3 4.4 4.2 4.4  CL 104 100* 104 102 100*  CO2 27 25 23 28 26   GLUCOSE 161* 144* 138* 126* 144*  BUN 33* 30* 30* 29* 49*  CREATININE 0.78 0.83 0.76 0.52 1.50*  CALCIUM 8.2* 8.5* 8.3* 8.5* 8.6*  MG  --   --   --   --  2.0   Liver Function Tests:  Recent Labs Lab 06/07/15 0210 06/09/15 0340 06/10/15 0400 06/12/15 0500  AST 149* 102* 85* 76*  ALT 108* 76* 65* 56*  ALKPHOS 540* 563* 515* 472*  BILITOT 0.3 0.4 0.2* 0.5   PROT 6.5 6.6 6.3* 6.4*  ALBUMIN 1.7* 1.7* 1.7* 1.6*   No results for input(s): LIPASE, AMYLASE in the last 168 hours. No results for input(s): AMMONIA in the last 168 hours.  Cardiac Enzymes: No results for input(s): CKTOTAL, CKMB, CKMBINDEX, TROPONINI in the last 168 hours.  BNP (last 3 results)  Recent Labs  05/30/15 0009  BNP 149.0*    CBG:  Recent Labs Lab 06/12/15 1216 06/12/15 1636 06/13/15 0008 06/13/15 0517 06/13/15 0841  GLUCAP 149* 100* 145* 110* 140*    Recent Results (from the past 240 hour(s))  Culture, respiratory (NON-Expectorated)     Status: None   Collection Time: 06/03/15  4:30 PM  Result Value Ref Range Status   Specimen Description BRONCHIAL ALVEOLAR LAVAGE  Final   Special Requests NONE  Final   Gram Stain   Final    ABUNDANT WBC PRESENT, PREDOMINANTLY PMN NO SQUAMOUS EPITHELIAL CELLS SEEN NO ORGANISMS SEEN Performed at [facility]    Culture   Final    NO GROWTH 2 DAYS Performed at [facility]    Report Status 06/06/2015 FINAL  Final  Culture, respiratory (NON-Expectorated)     Status: None   Collection Time: 06/08/15  9:40 AM  Result Value Ref Range Status   Specimen Description TRACHEAL ASPIRATE  Final   Special Requests Immunocompromised  Final   Gram Stain   Final    ABUNDANT WBC PRESENT, PREDOMINANTLY PMN NO SQUAMOUS EPITHELIAL CELLS SEEN NO ORGANISMS SEEN Performed at [facility]    Culture   Final    FEW YEAST CONSISTENT WITH CANDIDA SPECIES Performed at [facility]    Report Status 06/11/2015 FINAL  Final     Studies: No results found.   Scheduled Meds: . antiseptic oral rinse  7 mL Mouth Rinse QID  . aspirin  300 mg Rectal Daily  . atorvastatin  40 mg Oral q1800  . cefTRIAXone (ROCEPHIN)  IV  2 g Intravenous Q12H  . chlorhexidine gluconate  15 mL Mouth Rinse BID  . [START ON 06/15/2015] enoxaparin (LOVENOX) injection  40 mg Subcutaneous Q24H  . insulin aspart  0-15 Units  Subcutaneous 6 times per day  . insulin glargine  10 Units Subcutaneous Daily  . metoprolol tartrate  12.5 mg Per Tube BID  . pantoprazole sodium  40 mg Per Tube Q1200  . potassium chloride  40 mEq Oral Daily  . sodium chloride flush  10-40 mL Intracatheter Q12H   Continuous Infusions: . sodium chloride 10 mL/hr at 06/08/15 0842  . feeding supplement (VITAL AF 1.2 CAL) 1,000 mL (06/13/15 0853)   PRN Meds: sodium chloride, acetaminophen (TYLENOL) oral liquid 160 mg/5 mL, antiseptic oral rinse, Gerhardt's butt cream, sodium chloride flush  Time spent: 3 minutes  Author: Berle Mull, MD [facility] Pager: [phone] 06/13/2015 11:21 AM  If 7PM-7AM, please contact night-coverage at [email or web address], password [facility]

## 2015-06-14 ENCOUNTER — Inpatient Hospital Stay (HOSPITAL_COMMUNITY): Payer: BC Managed Care – PPO

## 2015-06-14 DIAGNOSIS — E785 Hyperlipidemia, unspecified: Secondary | ICD-10-CM

## 2015-06-14 DIAGNOSIS — E162 Hypoglycemia, unspecified: Secondary | ICD-10-CM

## 2015-06-14 LAB — APTT: aPTT: 31 seconds (ref 24–37)

## 2015-06-14 LAB — BASIC METABOLIC PANEL
ANION GAP: 11 (ref 5–15)
BUN: 31 mg/dL — AB (ref 6–20)
CHLORIDE: 102 mmol/L (ref 101–111)
CO2: 28 mmol/L (ref 22–32)
Calcium: 9 mg/dL (ref 8.9–10.3)
Creatinine, Ser: 0.57 mg/dL (ref 0.44–1.00)
GFR calc Af Amer: >60 mL/min (ref >60)
GLUCOSE: 86 mg/dL (ref 65–99)
POTASSIUM: 4.2 mmol/L (ref 3.5–5.1)
Sodium: 141 mmol/L (ref 135–145)

## 2015-06-14 LAB — CBC
HEMATOCRIT: 30.7 % — AB (ref 36.0–46.0)
Hemoglobin: 9.2 g/dL — ABNORMAL LOW (ref 12.0–15.0)
MCH: 28.6 pg (ref 26.0–34.0)
MCHC: 30 g/dL (ref 30.0–36.0)
MCV: 95.3 fL (ref 78.0–100.0)
PLATELETS: 800 10*3/uL — AB (ref 150–400)
RBC: 3.22 MIL/uL — ABNORMAL LOW (ref 3.87–5.11)
RDW: 21 % — AB (ref 11.5–15.5)
WBC: 9.1 10*3/uL (ref 4.0–10.5)

## 2015-06-14 LAB — GLUCOSE, CAPILLARY
GLUCOSE-CAPILLARY: 59 mg/dL — AB (ref 65–99)
GLUCOSE-CAPILLARY: 92 mg/dL (ref 65–99)
Glucose-Capillary: 75 mg/dL (ref 65–99)
Glucose-Capillary: 81 mg/dL (ref 65–99)
Glucose-Capillary: 99 mg/dL (ref 65–99)
Glucose-Capillary: 78 mg/dL (ref 65–99)

## 2015-06-14 LAB — PROTIME-INR
INR: 1.13 (ref 0.00–1.49)
Prothrombin Time: 14.7 seconds (ref 11.6–15.2)

## 2015-06-14 MED ORDER — IOHEXOL 300 MG/ML  SOLN
50.0000 mL | Freq: Once | INTRAMUSCULAR | Status: AC | PRN
  Administered 2015-06-14: 5 mL via INTRAVENOUS

## 2015-06-14 MED ORDER — DEXTROSE 5 % IV SOLN
INTRAVENOUS | Status: DC
  Administered 2015-06-14 – 2015-06-16 (×2): via INTRAVENOUS

## 2015-06-14 MED ORDER — MIDAZOLAM HCL 2 MG/2ML IJ SOLN
INTRAMUSCULAR | Status: AC
  Filled 2015-06-14: qty 2

## 2015-06-14 MED ORDER — MIDAZOLAM HCL 2 MG/2ML IJ SOLN
INTRAMUSCULAR | Status: AC | PRN
Start: 2015-06-14 — End: 2015-06-14
  Administered 2015-06-14 (×2): 0.5 mg via INTRAVENOUS

## 2015-06-14 MED ORDER — DEXTROSE 50 % IV SOLN
25.0000 mL | Freq: Once | INTRAVENOUS | Status: AC
  Administered 2015-06-14: 25 mL via INTRAVENOUS

## 2015-06-14 MED ORDER — GLUCAGON HCL RDNA (DIAGNOSTIC) 1 MG IJ SOLR
INTRAMUSCULAR | Status: AC | PRN
  Administered 2015-06-14: 1 mg via INTRAVENOUS

## 2015-06-14 MED ORDER — LIDOCAINE HCL 1 % IJ SOLN
INTRAMUSCULAR | Status: AC
  Filled 2015-06-14: qty 20

## 2015-06-14 MED ORDER — FENTANYL CITRATE (PF) 100 MCG/2ML IJ SOLN
INTRAMUSCULAR | Status: AC | PRN
  Administered 2015-06-14 (×2): 25 ug via INTRAVENOUS

## 2015-06-14 MED ORDER — POTASSIUM CHLORIDE 20 MEQ/15ML (10%) PO SOLN
40.0000 meq | Freq: Every day | ORAL | Status: DC
  Administered 2015-06-15 – 2015-06-16 (×2): 40 meq
  Filled 2015-06-14 (×2): qty 30

## 2015-06-14 MED ORDER — ATORVASTATIN CALCIUM 40 MG PO TABS
40.0000 mg | ORAL_TABLET | Freq: Every day | ORAL | Status: DC
  Administered 2015-06-15 – 2015-06-16 (×2): 40 mg
  Filled 2015-06-14 (×2): qty 1

## 2015-06-14 MED ORDER — INSULIN ASPART 100 UNIT/ML ~~LOC~~ SOLN
0.0000 [IU] | SUBCUTANEOUS | Status: DC
  Administered 2015-06-16: 2 [IU] via SUBCUTANEOUS
  Administered 2015-06-16 (×2): 1 [IU] via SUBCUTANEOUS

## 2015-06-14 MED ORDER — FENTANYL CITRATE (PF) 100 MCG/2ML IJ SOLN
INTRAMUSCULAR | Status: AC
  Filled 2015-06-14: qty 2

## 2015-06-14 MED ORDER — GLUCAGON HCL RDNA (DIAGNOSTIC) 1 MG IJ SOLR
INTRAMUSCULAR | Status: AC
  Filled 2015-06-14: qty 1

## 2015-06-14 NOTE — Progress Notes (Signed)
Hypoglycemic Event  CBG: 59  Treatment: D50 IV 25 mL  Symptoms: None  Follow-up CBG: Time:1710 CBG Result:92  Possible Reasons for Event: Inadequate meal intake  Comments/MD notified:MD Laruth Bouchard, Gabriel Rainwater M

## 2015-06-14 NOTE — Sedation Documentation (Signed)
Patient denies pain and is resting comfortably.  

## 2015-06-14 NOTE — Progress Notes (Signed)
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 Pt wtih previous dx of flu 4 days prior to admission and started tamiflu. Ct (+) dense R infiltrated with volume loss  (+) PNA, group A strep bacteremia acute respiratory failure.  Pt intubated 05/29/15-06/08/15 PMH: sjogrens disease, hypothyroidism dysphagia seizures, adenopathy, scoliosis, spleen anomaly, anemnia, raynaud disease, osteopenia    PT Comments    Pt progressing towards all acute care goals. Requiring Min A x 2 for all OOB mobility. Pt tolerated gait training x 2 attempts today, pt displayed multiple instances of steppage gait, crossing midline and poor motor control,  however was able to make good forward progression with physical assist for stability. Pt will benefit from continued ambulation with a close chair follow as she fatigues quickly. Pts RUE remains flaccid and recommended a sling for further ambulation.  Will continue to follow pt acutely to progress acitivty tolerance before D/C to CIR to maximize recovery back towards baseline level of [place]  Follow Up Recommendations  CIR     Equipment Recommendations  None recommended by PT    Recommendations for Other Services       Precautions / Restrictions Precautions Precautions: Fall Precaution Comments: R hemiplegia, contacted RN about sling for RUE Restrictions Weight Bearing Restrictions: No    Mobility  Bed Mobility Overal bed mobility: Needs Assistance Bed Mobility: Supine to Sit     Supine to sit: Min assist     General bed mobility comments: Min A to bring R UE with pt to EOB, brings LEs off of bed independnetly, iniital hands on assist to stabilize in sitting  Transfers Overall transfer level: Needs assistance Equipment used: 2 person hand held assist Transfers: Sit  to/from [facility] Sit to Stand: Min assist;+2 physical assistance Stand pivot transfers: Min assist;+2 physical assistance       General transfer comment: SPT performed to [facility] and to chair, Min A for stability and intiial boost to stand, supporting RUE by PT with all transfers  Ambulation/Gait Ambulation/Gait assistance: Min assist;+2 physical assistance;+2 safety/equipment Ambulation Distance (Feet): 10 Feet (30ft x 2) Assistive device: 2 person hand held assist Gait Pattern/deviations: Step-through pattern;Decreased stride length;Decreased dorsiflexion - right;Decreased stance time - right;Ataxic;Trunk flexed;Decreased dorsiflexion - left Gait velocity: decreased Gait velocity interpretation: Below normal speed for age/gender General Gait Details: two walking attempts completed, HR increased to 125, on supplemental O2, pt requiring assist to support R UE, and Min A x 2 for balance, pt displays slight scissoring and ataxic gait however able to remain upright and make mild corrections with VCs   Stairs            Wheelchair Mobility    Modified Rankin (Stroke Patients Only) Modified Rankin (Stroke Patients Only) Pre-Morbid Rankin Score: No symptoms Modified Rankin: Moderately severe disability     Balance Overall balance assessment: Needs assistance Sitting-balance support: Feet supported;Single extremity supported Sitting balance-Leahy Scale: Fair     Standing balance support: Bilateral upper extremity supported;During functional activity Standing balance-Leahy Scale: Poor                      Cognition Arousal/Alertness: Awake/alert Behavior During Therapy: WFL for tasks assessed/performed Overall Cognitive Status: Within Functional Limits for tasks assessed  Exercises      General Comments General comments (skin integrity, edema, etc.): R UE flaccid, spoke with RN concerning ordering sling for ambulating Pt  requiring Max A for toileting hygiene due to balance deficits      Pertinent Vitals/Pain Pain Assessment: No/denies pain    Home Living                      Prior Function            PT Goals (current goals can now be found in the care plan section) Acute Rehab PT Goals Patient Stated Goal: have some ice chips Progress towards PT goals: Progressing toward goals    Frequency  Min 3X/week    PT Plan Current plan remains appropriate    Co-evaluation             End of Session Equipment Utilized During Treatment: Gait belt;Oxygen Activity Tolerance: Patient limited by fatigue;Patient tolerated treatment well Patient left: in chair;with call bell/phone within reach     Time: [id number] PT Time Calculation (min) (ACUTE ONLY): 30 min  Charges:                       G Codes:      Ara Kussmaul 07-12-15, 5:07 PM Ara Kussmaul, Student Physical Therapist Acute Rehab [phone]

## 2015-06-14 NOTE — Progress Notes (Signed)
Inpatient Rehabilitation  Note that team's plan is for patient to have PEG placement today.  Will continue to follow along for medical readiness.    Carmelia Roller., CCC/SLP Admission Coordinator  [place]  Cell [phone]

## 2015-06-14 NOTE — Procedures (Signed)
Successful fluoro 20 fr pull through Gtube No comp Stable EBL 0 Full use tomorrow after seen by IR PA

## 2015-06-14 NOTE — Sedation Documentation (Signed)
Pt restless, additional medication given

## 2015-06-14 NOTE — Progress Notes (Signed)
PROGRESS NOTE    [name] [name]  [id number]  DOB: 08-23-1955  DOA: 05/29/2015 PCP: No primary care provider on file. Outpatient Specialists:   Hospital course: [name] is a 60 y/o woman with a history of Sjogren's disease, hypothyroidism and dysphagia. She was diagnosed with the flu (A) about 4 days PTA and started on tamiflu. 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   Assessment & Plan:   Acute respiratory failure secondary to group A strep pneumonia - intubated 2/11- Extubated 2/21, patient was also treated for flu between 2/13-2/20, CIR consult pending approval, swallow eval , currently recommendation is to be NPO by speech therapy, Repeat chest x-ray today shows extensive air space consolidation on the right with partial interval clearing and left base infiltrate has cleared. Continue strict aspiration precautions.  Strep pyogenes bacteremia - continue ceftriaxone, since we are unable to perform TEE due to esophageal stricture, will need 6 weeks, defer TEE until stronger. Positive blood culture from 2/12 showing group A strep pansensitive. Patient being followed by infectious disease.   Septic shock - Secondary to influenza A, group A strep pneumonia and Streptococcus pyogenes bacteremia. Required ICU admission, intubation, vasopressors. Shock resolved,   Dysphagia - Patient was on tube feeding which was held overnight 2/26. Had PEG placement 2/27 with recommendations to start tube feeds 2/28. Patient had episode of hypoglycemia with CBG in the 50s. We'll start gentle IV D5W. Monitor CBGs closely.  Atrial flutter with rapid ventricular response resolved, - EKG shows atrial flutter with variable A-V block 2/15 , 2-D echo on 2/16 showed EF of 60-65%, continue metoprolol. Planning to transition to plavix / asa after PEG placement.No further Aflutter / fib at this point. -  Anticoagulation was initially started but discontinued due to hematuria.  Hematuria - Seems to have resolved. Anticoagulation held.  Diabetes mellitus- - patient currently on Lantus and SSI but without feeds due to newly placed PEG tube. Reduce sliding scale to sensitive as needed and place on gentle IV D5W until feeds were resumed.  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 Hypercoagulable workup in the future per neuro to rule out antiphospholipid antibody syndrome..EEG spike activity from the left frontal lobe, likely associated with current stroke - Start aspirin and Plavix after PEG tube placement  Transaminitis  - nml 2/14 , noted high 2/20, now decreasing, ? Right upper quadrant ultrasound shows cholelithiasis - Unclear etiology.  multiple autoimmune conditions with Sjgren's, rheumatoid arthritis,  - on plaquenil and prednisone at home. Consider starting after PEG tube in use.   Esophageal stricture- - PEG tube placed 2/27. May need outpatient EGD with endoscopy for dilatation in 6-8 weeks.  Hematuria Held heparin drip for 3 hours, and not restarted due to ongoing hematuria  Will need urology consult if continues despite holding heparin Renal ultrasound to rule out nephrolithiasis, negative   Protein calorie malnutrition  - Start tube feeds were able.   Hypothyroid - Continue Synthroid.  Candidiasis - Completed fluconazole treatment  Acute  kidney injury  - resolved.  Volume overload  - Secondary to IV fluid resuscitation in the hospital. Resolved. Lasix  discontinued.   Anemia - Stable  Thrombocytosis - ? Reactive.  DVT prophylaxis: Heparin Code Status: Full Family Communication: None at bedside Disposition Plan: DC when stable   Consultants:  PCCM  ID  Neurology  IR  Procedures:  Intubation/extubation  PEG  Antimicrobials:  IV Rocephin >  Subjective: Seen post PEG placement this afternoon. Felt flushed due to hypoglycemic episode. Resuscitated with dextrose water. Denies dyspnea or pain.   Objective: Filed Vitals:   06/14/15 0953 06/14/15 1105 06/14/15 1110 06/14/15 1118  BP: 111/65 103/59 104/50 107/59  Pulse: 81 92 98 93  Temp:      TempSrc:      Resp: 18 17 16 16   Height:      Weight:      SpO2: 100% 99% 99% 99%    Intake/Output Summary (Last 24 hours) at 06/14/15 1717 Last data filed at 06/14/15 0019  Gross per 24 hour  Intake 682.42 ml  Output    300 ml  Net 382.42 ml   Filed Weights   06/13/15 0500 06/13/15 1959 06/14/15 0500  Weight: 56.291 kg (124 lb 1.6 oz) 53.978 kg (119 lb) 53.978 kg (119 lb)    Exam:  General exam: Small built and frail chronically ill-looking female sitting up comfortably in chair this afternoon. Respiratory system: Clear. No increased work of breathing. Cardiovascular system: S1 & S2 heard, RRR. No JVD, murmurs, gallops, clicks or pedal edema. telemetry: Sinus rhythm.  Gastrointestinal system: Abdomen is nondistended, soft and nontender. Normal bowel sounds heard. PEG tube +.  Central nervous system: Alert and oriented. No focal neurological deficits. Extremities: Symmetric 5 x 5 power.   Data Reviewed: Basic Metabolic Panel:  Recent Labs Lab 06/09/15 0340 06/10/15 0400 06/12/15 0500 06/13/15 0507 06/14/15 0456  NA 137 136 137 136 141  K 4.3 4.4 4.2 4.4 4.2  CL 100* 104 102 100* 102  CO2 25 23 28 26 28   GLUCOSE 144* 138* 126* 144* 86  BUN 30* 30* 29* 49* 31*  CREATININE 0.83 0.76 0.52 1.50* 0.57  CALCIUM 8.5* 8.3* 8.5* 8.6* 9.0  MG  --   --   --   2.0  --    Liver Function Tests:  Recent Labs Lab 06/09/15 0340 06/10/15 0400 06/12/15 0500  AST 102* 85* 76*  ALT 76* 65* 56*  ALKPHOS 563* 515* 472*  BILITOT 0.4 0.2* 0.5  PROT 6.6 6.3* 6.4*  ALBUMIN 1.7* 1.7* 1.6*   No results for input(s): LIPASE, AMYLASE in the last 168 hours. No results for input(s): AMMONIA in the last 168 hours. CBC:  Recent Labs Lab 06/11/15 1340 06/12/15 0500 06/12/15 1608 06/13/15 0507 06/14/15 0456  WBC 17.2* 12.1* 13.8* 12.4* 9.1  NEUTROABS  --   --  9.2* 7.9*  --   HGB 9.0* 8.5* 10.1* 9.4* 9.2*  HCT 29.6* 28.6* 32.0* 30.2* 30.7*  MCV 93.7 94.7 93.3 92.9 95.3  PLT 716* 726* 821* 870* 800*   Cardiac Enzymes: No results for input(s): CKTOTAL, CKMB, CKMBINDEX, TROPONINI in the last 168 hours. BNP (last 3 results) No results for input(s): PROBNP in the last 8760 hours. CBG:  Recent Labs Lab 06/14/15 0459 06/14/15 0749 06/14/15 1159 06/14/15 1623 06/14/15 1713  GLUCAP 75 81 99 59* 92    Recent Results (from the past 240 hour(s))  Culture, respiratory (NON-Expectorated)     Status: None  Collection Time: 06/08/15  9:40 AM  Result Value Ref Range Status   Specimen Description TRACHEAL ASPIRATE  Final   Special Requests Immunocompromised  Final   Gram Stain   Final    ABUNDANT WBC PRESENT, PREDOMINANTLY PMN NO SQUAMOUS EPITHELIAL CELLS SEEN NO ORGANISMS SEEN Performed at [facility]    Culture   Final    FEW YEAST CONSISTENT WITH CANDIDA SPECIES Performed at [facility]    Report Status 06/11/2015 FINAL  Final         Studies: Ir Gastrostomy Tube Mod Sed  06/14/2015  INDICATION: Stroke, pneumonia, dysphagia, sepsis EXAM: FLUOROSCOPIC 20 FRENCH PULL-THROUGH GASTROSTOMY Date:  2/27/20172/27/2017 11:13 am Radiologist:  M. Daryll Brod, MD Guidance:  Fluoroscopic MEDICATIONS: Patient is already receiving Rocephin 1 g IV Q 12 hours.; Glucagon 1 mg IV ANESTHESIA/SEDATION: Versed 1.0 mg IV; Fentanyl 50 mcg IV  Moderate Sedation Time:  20 The patient was continuously monitored during the procedure by the interventional radiology nurse under my direct supervision. CONTRAST:  7mL OMNIPAQUE IOHEXOL 300 MG/ML SOLN - administered into the gastric lumen. FLUOROSCOPY TIME:  Fluoroscopy Time: 4 minutes 54 seconds (25 mGy). COMPLICATIONS: None immediate. PROCEDURE: Informed consent was obtained from the patient following explanation of the procedure, risks, benefits and alternatives. The patient understands, agrees and consents for the procedure. All questions were addressed. A time out was performed. Maximal barrier sterile technique utilized including caps, mask, sterile gowns, sterile gloves, large sterile drape, hand hygiene, and betadine prep. The left upper quadrant was sterilely prepped and draped. An oral gastric catheter was inserted into the stomach under fluoroscopy. The existing nasogastric feeding tube was removed. Air was injected into the stomach for insufflation and visualization under fluoroscopy. The air distended stomach was confirmed beneath the anterior abdominal wall in the frontal and lateral projections. Under sterile conditions and local anesthesia, a 39 gauge trocar needle was utilized to access the stomach percutaneously beneath the left subcostal margin. Needle position was confirmed within the stomach under biplane fluoroscopy. Contrast injection confirmed position also. A single T tack was deployed for gastropexy. Over an Amplatz guide wire, a 9-French sheath was inserted into the stomach. A snare device was utilized to capture the oral gastric catheter. The snare device was pulled retrograde from the stomach up the esophagus and out the oropharynx. The 20-French pull-through gastrostomy was connected to the snare device and pulled antegrade through the oropharynx down the esophagus into the stomach and then through the percutaneous tract external to the patient. The gastrostomy was assembled  externally. Contrast injection confirms position in the stomach. Images were obtained for documentation. The patient tolerated procedure well. No immediate complication. IMPRESSION: Fluoroscopic insertion of a 20-French "pull-through" gastrostomy. Electronically Signed   By: Jerilynn Mages.  Shick M.D.   On: 06/14/2015 11:39   Dg Chest Port 1 View  06/14/2015  CLINICAL DATA:  Pneumonia EXAM: PORTABLE CHEST 1 VIEW COMPARISON:  June 10, 2015 FINDINGS: There has been partial clearing of airspace consolidation on the right, primarily in the lower lobe region. Fairly extensive airspace consolidation in the periphery of the right mid and lower lung zones does remain. There has been interval clearing of patchy infiltrate from the left base with only mild atelectasis in this area currently present. No new opacity on the left. Heart size and pulmonary vascularity are normal. No adenopathy. Enteric tube tip is below the diaphragm. Central catheter tip is in the superior vena cava near but slightly superior to the cavoatrial junction. No  pneumothorax. IMPRESSION: Extensive airspace consolidation remains on the right, although there has been partial interval clearing on the right compared to recent prior study. Infiltrate from the left base has cleared with only mild atelectasis remaining in this area. No new opacity. No change in cardiac silhouette. Electronically Signed   By: Lowella Grip III M.D.   On: 06/14/2015 07:30        Scheduled Meds: . antiseptic oral rinse  7 mL Mouth Rinse QID  . aspirin  300 mg Rectal Daily  . atorvastatin  40 mg Oral q1800  . cefTRIAXone (ROCEPHIN)  IV  2 g Intravenous Q12H  . chlorhexidine gluconate  15 mL Mouth Rinse BID  . [START ON 06/15/2015] enoxaparin (LOVENOX) injection  40 mg Subcutaneous Q24H  . fentaNYL      . glucagon (human recombinant)      . insulin aspart  0-15 Units Subcutaneous 6 times per day  . insulin glargine  10 Units Subcutaneous Daily  . lidocaine      .  metoprolol tartrate  12.5 mg Per Tube BID  . midazolam      . pantoprazole sodium  40 mg Per Tube Q1200  . potassium chloride  40 mEq Oral Daily  . sodium chloride flush  10-40 mL Intracatheter Q12H   Continuous Infusions: . sodium chloride 10 mL/hr at 06/13/15 1532  . feeding supplement (VITAL AF 1.2 CAL) Stopped (06/14/15 0019)    Principal Problem:   Severe sepsis with septic shock (HCC) Active Problems:   Sepsis ([place])   CAP (community acquired pneumonia)   Encephalopathy   Encounter for central line placement   Lactic acidosis   Encounter for feeding tube placement   Stroke (cerebrum) ([place])   Research study patient   Ventilator dependence ([place])   Carotid thromboses   HLD (hyperlipidemia)   Acute respiratory failure (HCC)   Infection due to Streptococcus pyogenes   Hematuria   Dysphagia   Stricture of esophagus   CVA (cerebral infarction)    Time spent: 30 minutes.    Vernell Leep, MD, FACP, FHM. [facility] Pager [phone]  If 7PM-7AM, please contact night-coverage [email or web address] Password [facility] 06/14/2015, 5:17 PM    LOS: 15 days

## 2015-06-14 NOTE — Research (Signed)
ME1100-CL-103:  Patient seen today by this RN.  Patient has not yet read full informed consent form to provide an informed consent for continued participation in ME1100-CL-103 study.  Patient limited by fatigue.  Patient did acknowledge awareness of study.  Will continue to follow up with patient to re-consent, if/when appropriate.  This RN reviewed a few home medications with patient to review start dates of medications.  Patient confirmed that she is on vaginal ointment for early menopause, and has been using since a few years after early menopause diagnosis.  Patient also confirmed the following: Has been using zyrtec for 5+ years, fluticasone for about 5 years, plaquenil for about 10+ years, levothyroxine 20+ years, multivitamins, including oral Vitamin B-12 for about 3 years.  Doreatha Martin, RN

## 2015-06-14 NOTE — Progress Notes (Signed)
Orthopedic Tech Progress Note Patient Details:  [name] [name] [birth year] [id number]  Ortho Devices Type of Ortho Device: Shoulder immobilizer Ortho Device/Splint Location: RUE Ortho Device/Splint Interventions: Ordered, Application   Braulio Bosch 06/14/2015, 6:12 PM

## 2015-06-14 NOTE — Progress Notes (Signed)
PT recommends a sling for patient's right arm- MD paged

## 2015-06-14 NOTE — Progress Notes (Signed)
SLP Cancellation Note  Patient Details Name: [name] [name] MRN: [id number] DOB: 01/23/1956   Cancelled treatment:       Reason Eval/Treat Not Completed: Medical issues which prohibited therapy - pt NPO, scheduled for PEG placement today. Will see as able.   Germain Osgood, M.A. CCC-SLP [phone]  Germain Osgood 06/14/2015, 8:49 AM

## 2015-06-15 ENCOUNTER — Inpatient Hospital Stay (HOSPITAL_COMMUNITY): Payer: BC Managed Care – PPO

## 2015-06-15 LAB — GLUCOSE, CAPILLARY
GLUCOSE-CAPILLARY: 127 mg/dL — AB (ref 65–99)
GLUCOSE-CAPILLARY: 82 mg/dL (ref 65–99)
Glucose-Capillary: 101 mg/dL — ABNORMAL HIGH (ref 65–99)
Glucose-Capillary: 105 mg/dL — ABNORMAL HIGH (ref 65–99)
Glucose-Capillary: 110 mg/dL — ABNORMAL HIGH (ref 65–99)
Glucose-Capillary: 84 mg/dL (ref 65–99)
Glucose-Capillary: 87 mg/dL (ref 65–99)

## 2015-06-15 MED ORDER — INSULIN GLARGINE 100 UNIT/ML ~~LOC~~ SOLN
5.0000 [IU] | Freq: Every day | SUBCUTANEOUS | Status: DC
  Administered 2015-06-16: 5 [IU] via SUBCUTANEOUS
  Filled 2015-06-15: qty 0.05

## 2015-06-15 MED ORDER — JEVITY 1.2 CAL PO LIQD
1000.0000 mL | ORAL | Status: DC
Start: 2015-06-15 — End: 2015-06-16
  Administered 2015-06-15 – 2015-06-16 (×2): 1000 mL
  Filled 2015-06-15 (×4): qty 1000

## 2015-06-15 MED ORDER — FREE WATER
100.0000 mL | Freq: Four times a day (QID) | Status: DC
  Administered 2015-06-15 – 2015-06-16 (×6): 100 mL

## 2015-06-15 NOTE — Progress Notes (Signed)
Nutrition Follow-up  DOCUMENTATION CODES:   Not applicable  INTERVENTION:  Discontinue Vital AF 1.2 formula.  Initiate Jevity 1.2 formula @ 30 ml/hr via PEG and increase by 10 ml every 4 hours to goal rate of 60 ml/hr.   Tube feeding regimen provides 1728 kcal (100% of needs), 80 grams of protein, and 1167 ml of H2O.   Provide free water flushes of 100 ml QID per tube.   RD to continue to monitor.   NUTRITION DIAGNOSIS:   Inadequate oral intake related to inability to eat as evidenced by NPO status; ongoing  GOAL:   Patient will meet greater than or equal to 90% of their needs; not met  MONITOR:   TF tolerance, Weight trends, Labs, I & O's, Skin, Diet advancement  REASON FOR ASSESSMENT:   Consult Enteral/tube feeding initiation and management  ASSESSMENT:   60 y/o woman with a history of Sjogren's disease, hypothyroidism and dysphagia. She was diagnosed with the flu (A) about 4 days ago and started on tamiflu. According to her husband, she had actually been improving the 24 hours prior to admission with fever resolving and improvement in her nausea and vomiting. He reports that the evening of admission she developed weakness and altered mental status. She was brought to [facility] ED via ambulance.  PEG tube placed 2/27. Per IR, PEG tube is now ready to use. Pt reports no abdominal discomfort during time of visit. MBS completed at noon today. Pt was moderate to severe aspiration risk and recommends NPO. Pt was previously receiving Vital AF 1.2 formula at 55 ml/hr which provided 1584 kcal, 99 grams of protein, and 1069 ml of free water. RD to modify tube and switch formula to Jevity 1.2 to better fit estimated nutrition need. RD to continue to monitor.   Pt with no observed significant fat or muscle mass loss. Noted weight has been trending down. Pt reports usual body weight of ~130 lbs.   Diet Order:  Diet NPO time specified  Skin:   (Incision on abdomen)  Last BM:   2/26  Height:   Ht Readings from Last 1 Encounters:  06/10/15 5' 2"  (1.575 m)    Weight:   Wt Readings from Last 1 Encounters:  06/14/15 119 lb (53.978 kg)    Ideal Body Weight:  50 kg  BMI:  Body mass index is 21.76 kg/(m^2).  Estimated Nutritional Needs:   Kcal:  1650-1850  Protein:  70-80 grams  Fluid:  1.6 - 1.8 L/day  EDUCATION NEEDS:   No education needs identified at this time  Corrin Parker, MS, RD, LDN Pager # [phone] After hours/ weekend pager # [phone]

## 2015-06-15 NOTE — Progress Notes (Signed)
Referring Physician(s): TRH  Supervising Physician: Markus Daft  Chief Complaint:  Percutaneous gastric tube placed 2/27  Subjective:  Experienced some burning with meds this am Suggest slower push when using  No other issues Site looks great  Allergies: Erythromycin ethylsuccinate; Keflex; Penicillins; and Sulfa antibiotics  Medications: Prior to Admission medications   Medication Sig Start Date End Date Taking? Authorizing Provider  cetirizine (ZYRTEC) 10 MG tablet Take 10 mg by mouth daily.   Yes Historical Provider, MD  conjugated estrogens (PREMARIN) vaginal cream Place 1 Applicatorful vaginally 2 (two) times a week.   Yes Historical Provider, MD  fluticasone (FLONASE) 50 MCG/ACT nasal spray Place 2 sprays into both nostrils daily.   Yes Historical Provider, MD  hydroxychloroquine (PLAQUENIL) 200 MG tablet Take by mouth daily.   Yes Historical Provider, MD  levothyroxine (SYNTHROID, LEVOTHROID) 112 MCG tablet Take 112 mcg by mouth daily before breakfast.   Yes Historical Provider, MD  Multiple Vitamin (MULTIVITAMIN) tablet Take 1 tablet by mouth daily.   Yes Historical Provider, MD  predniSONE (DELTASONE) 10 MG tablet Take 10 mg by mouth daily with breakfast.   Yes Historical Provider, MD  predniSONE (DELTASONE) 20 MG tablet Take 1 tablet by mouth daily. 05/27/15  Yes Historical Provider, MD  tri-vitamin w/ fluoride (TRI-VI-SOL) 0.25 MG/ML solution Take 0.25 mg by mouth daily.   Yes Historical Provider, MD  vitamin B-12 (CYANOCOBALAMIN) 1000 MCG tablet Take 1,000 mcg by mouth daily.   Yes Historical Provider, MD     Vital Signs: BP 99/60 mmHg  Pulse 73  Temp(Src) 98.2 F (36.8 C) (Oral)  Resp 16  Ht 5\' 2"  (1.575 m)  Wt 119 lb (53.978 kg)  BMI 21.76 kg/m2  SpO2 100%  Physical Exam  Abdominal: Soft. Bowel sounds are normal.  Skin: Skin is warm and dry.  Site of G tube is clean and dry NT no bleeding No hematoma  +BS afeb  Vitals  reviewed.   Imaging: Ir Gastrostomy Tube Mod Sed  06/14/2015  INDICATION: Stroke, pneumonia, dysphagia, sepsis EXAM: FLUOROSCOPIC 20 FRENCH PULL-THROUGH GASTROSTOMY Date:  2/27/20172/27/2017 11:13 am Radiologist:  M. Daryll Brod, MD Guidance:  Fluoroscopic MEDICATIONS: Patient is already receiving Rocephin 1 g IV Q 12 hours.; Glucagon 1 mg IV ANESTHESIA/SEDATION: Versed 1.0 mg IV; Fentanyl 50 mcg IV Moderate Sedation Time:  20 The patient was continuously monitored during the procedure by the interventional radiology nurse under my direct supervision. CONTRAST:  20mL OMNIPAQUE IOHEXOL 300 MG/ML SOLN - administered into the gastric lumen. FLUOROSCOPY TIME:  Fluoroscopy Time: 4 minutes 54 seconds (25 mGy). COMPLICATIONS: None immediate. PROCEDURE: Informed consent was obtained from the patient following explanation of the procedure, risks, benefits and alternatives. The patient understands, agrees and consents for the procedure. All questions were addressed. A time out was performed. Maximal barrier sterile technique utilized including caps, mask, sterile gowns, sterile gloves, large sterile drape, hand hygiene, and betadine prep. The left upper quadrant was sterilely prepped and draped. An oral gastric catheter was inserted into the stomach under fluoroscopy. The existing nasogastric feeding tube was removed. Air was injected into the stomach for insufflation and visualization under fluoroscopy. The air distended stomach was confirmed beneath the anterior abdominal wall in the frontal and lateral projections. Under sterile conditions and local anesthesia, a 71 gauge trocar needle was utilized to access the stomach percutaneously beneath the left subcostal margin. Needle position was confirmed within the stomach under biplane fluoroscopy. Contrast injection confirmed position also. A single T tack  was deployed for gastropexy. Over an Amplatz guide wire, a 9-French sheath was inserted into the stomach. A snare  device was utilized to capture the oral gastric catheter. The snare device was pulled retrograde from the stomach up the esophagus and out the oropharynx. The 20-French pull-through gastrostomy was connected to the snare device and pulled antegrade through the oropharynx down the esophagus into the stomach and then through the percutaneous tract external to the patient. The gastrostomy was assembled externally. Contrast injection confirms position in the stomach. Images were obtained for documentation. The patient tolerated procedure well. No immediate complication. IMPRESSION: Fluoroscopic insertion of a 20-French "pull-through" gastrostomy. Electronically Signed   By: Jerilynn Mages.  Shick M.D.   On: 06/14/2015 11:39   Dg Chest Port 1 View  06/14/2015  CLINICAL DATA:  Pneumonia EXAM: PORTABLE CHEST 1 VIEW COMPARISON:  June 10, 2015 FINDINGS: There has been partial clearing of airspace consolidation on the right, primarily in the lower lobe region. Fairly extensive airspace consolidation in the periphery of the right mid and lower lung zones does remain. There has been interval clearing of patchy infiltrate from the left base with only mild atelectasis in this area currently present. No new opacity on the left. Heart size and pulmonary vascularity are normal. No adenopathy. Enteric tube tip is below the diaphragm. Central catheter tip is in the superior vena cava near but slightly superior to the cavoatrial junction. No pneumothorax. IMPRESSION: Extensive airspace consolidation remains on the right, although there has been partial interval clearing on the right compared to recent prior study. Infiltrate from the left base has cleared with only mild atelectasis remaining in this area. No new opacity. No change in cardiac silhouette. Electronically Signed   By: Lowella Grip III M.D.   On: 06/14/2015 07:30    Labs:  CBC:  Recent Labs  06/12/15 0500 06/12/15 1608 06/13/15 0507 06/14/15 0456  WBC 12.1*  13.8* 12.4* 9.1  HGB 8.5* 10.1* 9.4* 9.2*  HCT 28.6* 32.0* 30.2* 30.7*  PLT 726* 821* 870* 800*    COAGS:  Recent Labs  05/30/15 0202 06/01/15 1140 06/14/15 0456  INR 1.45 1.50* 1.13  APTT 33 41* 31    BMP:  Recent Labs  06/10/15 0400 06/12/15 0500 06/13/15 0507 06/14/15 0456  NA 136 137 136 141  K 4.4 4.2 4.4 4.2  CL 104 102 100* 102  CO2 23 28 26 28   GLUCOSE 138* 126* 144* 86  BUN 30* 29* 49* 31*  CALCIUM 8.3* 8.5* 8.6* 9.0  CREATININE 0.76 0.52 1.50* 0.57  GFRNONAA >60 >60 36* >60  GFRAA >60 >60 42* >60    LIVER FUNCTION TESTS:  Recent Labs  06/07/15 0210 06/09/15 0340 06/10/15 0400 06/12/15 0500  BILITOT 0.3 0.4 0.2* 0.5  AST 149* 102* 85* 76*  ALT 108* 76* 65* 56*  ALKPHOS 540* 563* 515* 472*  PROT 6.5 6.6 6.3* 6.4*  ALBUMIN 1.7* 1.7* 1.7* 1.6*    Assessment and Plan:  Perc G tube placed in IR 2/27 May use now  Electronically Signed: Omario Ander A 06/15/2015, 11:15 AM   I spent a total of 15 Minutes at the the patient's bedside AND on the patient's hospital floor or unit, greater than 50% of which was counseling/coordinating care for perc G tube

## 2015-06-15 NOTE — Progress Notes (Addendum)
Inpatient Rehabilitation  I have visited pt. At the bedside.  PEG feedings began today.  I have left a voicemail for pt.'s husband to call me.  I will initiate insurance auth process.  If for some reason pt. Is not able to come to IP Rehab, she will need a back up plan for her rehab recovery.  I have discussed with Jasmine Pang, RNCM and Crawford Givens, CSW.  Please call if questions.  [place] Admissions Coordinator Cell [phone] Office [phone]   ADDENDUM 1540:  I received a call back from [employer], pt's husband.  He continues to want CIR for his wife when medically ready.  I have informed him  that we await an insurance decision, medical readiness and bed availability for IP Rehab.    Gerlean Ren

## 2015-06-15 NOTE — Progress Notes (Signed)
RN administered am meds via G-tube, pt said she felt flushed and light-headed. BP 99/60 after flushing. Pt began to feel better. Notified IR PA, Jannifer Franklin. No new orders, will continue to monitor.    Penni Bombard, RN 11:17 AM 06/15/2015

## 2015-06-15 NOTE — Progress Notes (Signed)
MBSS complete. Full report located under chart review in imaging section.  Hser Belanger Paiewonsky, M.A. CCC-SLP [phone]  

## 2015-06-15 NOTE — Research (Signed)
ME 1100-CL-103:  This RN reviewed LAR pamphlet with patient and provided blank informed consent form for patient to review.  Patient continues to be limited by fatigue in review of informed consent form.  Blood collected by IV RN for central laboratory used by research study for requested follow up labs.  Processed by this RN.  Doreatha Martin, RN   Lavon Paganini. Titus Mould, MD, [place] Pgr: [facility]

## 2015-06-15 NOTE — Progress Notes (Signed)
Speech Language Pathology Treatment: Dysphagia  Patient Details Name: [name] [name] MRN: [id number] DOB: 1955-12-16 Today's Date: 06/15/2015 Time: [id number] SLP Time Calculation (min) (ACUTE ONLY): 15 min  Assessment / Plan / Recommendation Clinical Impression  Pt/family report feeling of overall improvement in strength, and specifically improvements in swallowing with completion of exercises as well. Pt consumed ice chips, 2-3 per spoonful, with intermittent delayed throat clear but no changes in vocal quality and no overt coughing. She spontaneously uses a chin tuck during completion of effortful swallows. Recommend to repeat MBS to assess for improvements in swallow, scheduled for this afternoon with radiology. Pt/husband in agreement.   HPI HPI: Ms/ [name] is a 60 y/o woman with a history of Sjogren's disease, hypothyroidism and dysphagia. She was diagnosed with the flu (A) about 4 days PTA and started on tamiflu. 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  Intubated from 2/12 to 2/21.There is a question of esophageal stricture; pt was seen by GI at [facility], complained of globus with pills and some solids, occuring 2-3x a week. Was scheduled for an EGD at [facility], but appears to have cancelled the procedure. 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      SLP Plan  MBS     Recommendations  Diet recommendations: NPO Medication Administration: Via alternative means             Oral Care Recommendations: Oral care QID Follow up Recommendations: Inpatient Rehab Plan: MBS     GO               [name] [name], M.A. CCC-SLP [phone]  [name] [name] 06/15/2015, 9:22 AM

## 2015-06-15 NOTE — Progress Notes (Signed)
PROGRESS NOTE    Floris Gameros  [id number]  DOB: 1955/05/15  DOA: 05/29/2015 PCP: No primary care provider on file. Outpatient Specialists:   Hospital course: Ms/ Mouradian is a 60 y/o woman with a history of Sjogren's disease, hypothyroidism and dysphagia. She was diagnosed with the flu (A) about 4 days PTA and started on tamiflu. 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   Assessment & Plan:   Acute respiratory failure secondary to group A strep pneumonia - intubated 2/11- Extubated 2/21, patient was also treated for flu between 2/13-2/20, CIR consult pending approval, swallow eval , currently recommendation is to be NPO by speech therapy, Repeat chest x-ray today shows extensive air space consolidation on the right with partial interval clearing and left base infiltrate has cleared. Continue strict aspiration precautions.  Strep pyogenes bacteremia - continue ceftriaxone, since we are unable to perform TEE due to esophageal stricture, will need 6 weeks (treating for endocarditis), defer TEE until stronger. Positive blood culture from 2/12 showing group A strep pansensitive. Patient was followed by infectious disease.   Septic shock - Secondary to influenza A, group A strep pneumonia and Streptococcus pyogenes bacteremia. Required ICU admission, intubation, vasopressors. Shock resolved,   Dysphagia - Patient was on tube feeding which was held overnight 2/26. Had PEG placement 2/27 with recommendations to start tube feeds 2/28. Patient had episode of hypoglycemia with CBG in the 50s. We'll start gentle IV D5W. Monitor CBGs closely. - Tube feeds started at lower rate on 2/28. Depending on progress, advance tube feeds and meds via tube.  Atrial flutter with rapid ventricular response resolved, - EKG shows atrial flutter with variable A-V block 2/15 , 2-D echo on 2/16 showed EF of  60-65%, continue metoprolol. Planning to transition to plavix / asa after PEG placement.No further Aflutter / fib at this point. - Anticoagulation was initially started but discontinued due to hematuria.  Hematuria - Seems to have resolved. Anticoagulation held.  Diabetes mellitus- - patient currently on Lantus and SSI but without feeds due to newly placed PEG tube. Reduce sliding scale to sensitive as needed and place on gentle IV D5W until feeds were resumed. - No further hypoglycemic episodes.  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 Hypercoagulable workup in the future per neuro to rule out antiphospholipid antibody syndrome..EEG spike activity from the left frontal lobe, likely associated with current stroke - Start aspirin and Plavix after PEG tube placement  Transaminitis  - nml 2/14 , noted high 2/20, now decreasing, ? Right upper quadrant ultrasound shows cholelithiasis - Unclear etiology.  multiple autoimmune conditions with Sjgren's, rheumatoid arthritis,  - on plaquenil and prednisone at home. Consider starting after PEG tube in use.   Esophageal stricture- - PEG tube placed 2/27. May need outpatient EGD with endoscopy for dilatation in 6-8 weeks.  Hematuria Held heparin drip for 3 hours, and not restarted due to ongoing hematuria  Will need urology consult if continues despite holding heparin Renal ultrasound to rule  out nephrolithiasis, negative   Protein calorie malnutrition  - Start tube feeds were able.   Hypothyroid - Continue  Synthroid.  Candidiasis - Completed fluconazole treatment  Acute kidney injury  - resolved.  Volume overload  - Secondary to IV fluid resuscitation in the hospital. Resolved. Lasix discontinued.   Anemia - Stable  Thrombocytosis - ? Reactive.  DVT prophylaxis: Heparin Code Status: Full Family Communication: None at bedside Disposition Plan: DC when stable   Consultants:  PCCM  ID  Neurology  IR  Procedures:  Intubation/extubation  PEG  Antimicrobials:  IV Rocephin >  Subjective: Patient complained of some pain upon initial use of PEG tube this morning which has since improved. As per RN, tube feeds started at low rate. No other complaints reported.  Objective: Filed Vitals:   06/15/15 0900 06/15/15 1100 06/15/15 1500 06/15/15 1600  BP: 106/63 99/60 93/48  100/51  Pulse: 73  77 80  Temp: 98.2 F (36.8 C)  97.8 F (36.6 C) 97.8 F (36.6 C)  TempSrc: Oral  Oral Oral  Resp: 16     Height:      Weight:      SpO2: 100% 96% 95%     Intake/Output Summary (Last 24 hours) at 06/15/15 1704 Last data filed at 06/15/15 1100  Gross per 24 hour  Intake    770 ml  Output      0 ml  Net    770 ml   Filed Weights   06/13/15 0500 06/13/15 1959 06/14/15 0500  Weight: 56.291 kg (124 lb 1.6 oz) 53.978 kg (119 lb) 53.978 kg (119 lb)    Exam:  General exam: Small built and frail chronically ill-looking lying comfortably supine in bed. Respiratory system: Clear. No increased work of breathing. Cardiovascular system: S1 & S2 heard, RRR. No JVD, murmurs, gallops, clicks or pedal edema.   Gastrointestinal system: Abdomen is nondistended, soft and nontender. Normal bowel sounds heard. PEG tube +.  Central nervous system: Alert and oriented. No focal neurological deficits. Extremities: Symmetric 5 x 5 power.   Data Reviewed: Basic Metabolic Panel:  Recent Labs Lab 06/09/15 0340 06/10/15 0400 06/12/15 0500 06/13/15 0507 06/14/15 0456  NA 137 136 137 136  141  K 4.3 4.4 4.2 4.4 4.2  CL 100* 104 102 100* 102  CO2 25 23 28 26 28   GLUCOSE 144* 138* 126* 144* 86  BUN 30* 30* 29* 49* 31*  CREATININE 0.83 0.76 0.52 1.50* 0.57  CALCIUM 8.5* 8.3* 8.5* 8.6* 9.0  MG  --   --   --  2.0  --    Liver Function Tests:  Recent Labs Lab 06/09/15 0340 06/10/15 0400 06/12/15 0500  AST 102* 85* 76*  ALT 76* 65* 56*  ALKPHOS 563* 515* 472*  BILITOT 0.4 0.2* 0.5  PROT 6.6 6.3* 6.4*  ALBUMIN 1.7* 1.7* 1.6*   No results for input(s): LIPASE, AMYLASE in the last 168 hours. No results for input(s): AMMONIA in the last 168 hours. CBC:  Recent Labs Lab 06/11/15 1340 06/12/15 0500 06/12/15 1608 06/13/15 0507 06/14/15 0456  WBC 17.2* 12.1* 13.8* 12.4* 9.1  NEUTROABS  --   --  9.2* 7.9*  --   HGB 9.0* 8.5* 10.1* 9.4* 9.2*  HCT 29.6* 28.6* 32.0* 30.2* 30.7*  MCV 93.7 94.7 93.3 92.9 95.3  PLT 716* 726* 821* 870* 800*   Cardiac Enzymes: No results for input(s): CKTOTAL, CKMB, CKMBINDEX, TROPONINI in the last 168 hours. BNP (last 3 results) No results for  input(s): PROBNP in the last 8760 hours. CBG:  Recent Labs Lab 06/14/15 2227 06/15/15 0040 06/15/15 0428 06/15/15 0801 06/15/15 1346  GLUCAP 78 84 101* 82 87    Recent Results (from the past 240 hour(s))  Culture, respiratory (NON-Expectorated)     Status: None   Collection Time: 06/08/15  9:40 AM  Result Value Ref Range Status   Specimen Description TRACHEAL ASPIRATE  Final   Special Requests Immunocompromised  Final   Gram Stain   Final    ABUNDANT WBC PRESENT, PREDOMINANTLY PMN NO SQUAMOUS EPITHELIAL CELLS SEEN NO ORGANISMS SEEN Performed at [facility]    Culture   Final    FEW YEAST CONSISTENT WITH CANDIDA SPECIES Performed at [facility]    Report Status 06/11/2015 FINAL  Final         Studies: Ir Gastrostomy Tube Mod Sed  06/14/2015  INDICATION: Stroke, pneumonia, dysphagia, sepsis EXAM: FLUOROSCOPIC 20 FRENCH PULL-THROUGH GASTROSTOMY Date:   2/27/20172/27/2017 11:13 am Radiologist:  M. Daryll Brod, MD Guidance:  Fluoroscopic MEDICATIONS: Patient is already receiving Rocephin 1 g IV Q 12 hours.; Glucagon 1 mg IV ANESTHESIA/SEDATION: Versed 1.0 mg IV; Fentanyl 50 mcg IV Moderate Sedation Time:  20 The patient was continuously monitored during the procedure by the interventional radiology nurse under my direct supervision. CONTRAST:  64mL OMNIPAQUE IOHEXOL 300 MG/ML SOLN - administered into the gastric lumen. FLUOROSCOPY TIME:  Fluoroscopy Time: 4 minutes 54 seconds (25 mGy). COMPLICATIONS: None immediate. PROCEDURE: Informed consent was obtained from the patient following explanation of the procedure, risks, benefits and alternatives. The patient understands, agrees and consents for the procedure. All questions were addressed. A time out was performed. Maximal barrier sterile technique utilized including caps, mask, sterile gowns, sterile gloves, large sterile drape, hand hygiene, and betadine prep. The left upper quadrant was sterilely prepped and draped. An oral gastric catheter was inserted into the stomach under fluoroscopy. The existing nasogastric feeding tube was removed. Air was injected into the stomach for insufflation and visualization under fluoroscopy. The air distended stomach was confirmed beneath the anterior abdominal wall in the frontal and lateral projections. Under sterile conditions and local anesthesia, a 51 gauge trocar needle was utilized to access the stomach percutaneously beneath the left subcostal margin. Needle position was confirmed within the stomach under biplane fluoroscopy. Contrast injection confirmed position also. A single T tack was deployed for gastropexy. Over an Amplatz guide wire, a 9-French sheath was inserted into the stomach. A snare device was utilized to capture the oral gastric catheter. The snare device was pulled retrograde from the stomach up the esophagus and out the oropharynx. The 20-French  pull-through gastrostomy was connected to the snare device and pulled antegrade through the oropharynx down the esophagus into the stomach and then through the percutaneous tract external to the patient. The gastrostomy was assembled externally. Contrast injection confirms position in the stomach. Images were obtained for documentation. The patient tolerated procedure well. No immediate complication. IMPRESSION: Fluoroscopic insertion of a 20-French "pull-through" gastrostomy. Electronically Signed   By: Jerilynn Mages.  Shick M.D.   On: 06/14/2015 11:39   Dg Chest Port 1 View  06/14/2015  CLINICAL DATA:  Pneumonia EXAM: PORTABLE CHEST 1 VIEW COMPARISON:  June 10, 2015 FINDINGS: There has been partial clearing of airspace consolidation on the right, primarily in the lower lobe region. Fairly extensive airspace consolidation in the periphery of the right mid and lower lung zones does remain. There has been interval clearing of patchy infiltrate from the  left base with only mild atelectasis in this area currently present. No new opacity on the left. Heart size and pulmonary vascularity are normal. No adenopathy. Enteric tube tip is below the diaphragm. Central catheter tip is in the superior vena cava near but slightly superior to the cavoatrial junction. No pneumothorax. IMPRESSION: Extensive airspace consolidation remains on the right, although there has been partial interval clearing on the right compared to recent prior study. Infiltrate from the left base has cleared with only mild atelectasis remaining in this area. No new opacity. No change in cardiac silhouette. Electronically Signed   By: Lowella Grip III M.D.   On: 06/14/2015 07:30   Dg Swallowing Func-speech Pathology  06/15/2015  Objective Swallowing Evaluation: Type of Study: MBS-Modified Barium Swallow Study Patient Details Name: Jady Chhim MRN: [id number] Date of Birth: [birth year] Today's Date: 06/15/2015 Time: SLP Start Time (ACUTE ONLY): 1206-SLP  Stop Time (ACUTE ONLY): 1223 SLP Time Calculation (min) (ACUTE ONLY): 17 min Past Medical History: Past Medical History Diagnosis Date . Hypothyroidism  . Seizures ([place])  . Adenopathy  . Scoliosis  . Spleen anomaly  . Anemia  . Sjogren's disease ([place])  . Raynaud disease  . Shingles  . Osteopenia  Past Surgical History: Past Surgical History Procedure Laterality Date . Tonsillectomy   . Breast cyst aspiration Right 1990's   neg HPI: Ms/ Vala is a 60 y/o woman with a history of Sjogren's disease, hypothyroidism and dysphagia. She was diagnosed with the flu (A) about 4 days PTA and started on tamiflu. 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  Intubated from 2/12 to 2/21.There is a question of esophageal stricture; pt was seen by GI at [facility], complained of globus with pills and some solids, occuring 2-3x a week. Was scheduled for an EGD at [facility], but appears to have cancelled the procedure. 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 Subjective: pt alert, without complaints Assessment / Plan / Recommendation CHL IP CLINICAL IMPRESSIONS 06/15/2015 Therapy Diagnosis Moderate pharyngeal phase dysphagia;Moderate cervical esophageal phase dysphagia Clinical Impression Pt shows mild-moderate improvements since initial MBS, although still with reduced airway protection and pharyngeal clearance. Hyolaryngeal movement is reduced and minimal epiglottic deflection results in poor airway protection. Delay in swallow initiation results in small amounts of silent penetration to the vocal cords with tsp of thin and nectar thick liquids. Cough is strong but not entirely effective at expelling penetrates. A chin tuck prevents penetration with nectar thick liquids, but even tsp-sized boluses required multiple swallows to try to clear diffuse pharyngeal residue. While it is not yet safe for her to  resume a PO diet, would recommend use of therapeutic boluses of nectar thick liquids by spoon with SLP only. Impact on safety and function Moderate aspiration risk;Severe aspiration risk   CHL IP TREATMENT RECOMMENDATION 06/15/2015 Treatment Recommendations Therapy as outlined in treatment plan below   Prognosis 06/15/2015 Prognosis for Safe Diet Advancement Fair Barriers to Reach Goals Severity of deficits Barriers/Prognosis Comment -- CHL IP DIET RECOMMENDATION 06/15/2015 SLP Diet Recommendations Alternative means - temporary;NPO Liquid Administration via -- Medication Administration Via alternative means Compensations -- Postural Changes --   CHL IP OTHER RECOMMENDATIONS 06/15/2015 Recommended Consults -- Oral Care Recommendations Oral care QID Other Recommendations --   CHL IP FOLLOW UP RECOMMENDATIONS 06/15/2015 Follow up Recommendations Inpatient Rehab   CHL IP FREQUENCY AND DURATION 06/15/2015 Speech Therapy Frequency (  ACUTE ONLY) min 3x week Treatment Duration 2 weeks      CHL IP ORAL PHASE 06/15/2015 Oral Phase WFL Oral - Pudding Teaspoon -- Oral - Pudding Cup -- Oral - Honey Teaspoon -- Oral - Honey Cup -- Oral - Nectar Teaspoon -- Oral - Nectar Cup -- Oral - Nectar Straw -- Oral - Thin Teaspoon -- Oral - Thin Cup -- Oral - Thin Straw -- Oral - Puree -- Oral - Mech Soft -- Oral - Regular -- Oral - Multi-Consistency -- Oral - Pill -- Oral Phase - Comment --  CHL IP PHARYNGEAL PHASE 06/15/2015 Pharyngeal Phase Impaired Pharyngeal- Pudding Teaspoon -- Pharyngeal -- Pharyngeal- Pudding Cup -- Pharyngeal -- Pharyngeal- Honey Teaspoon NT Pharyngeal -- Pharyngeal- Honey Cup -- Pharyngeal -- Pharyngeal- Nectar Teaspoon Delayed swallow initiation-vallecula;Reduced epiglottic inversion;Reduced anterior laryngeal mobility;Reduced laryngeal elevation;Reduced airway/laryngeal closure;Reduced tongue base retraction;Reduced pharyngeal peristalsis;Penetration/Aspiration before swallow;Pharyngeal residue - valleculae;Pharyngeal  residue - pyriform;Compensatory strategies attempted (with notebox) Pharyngeal Material enters airway, CONTACTS cords and not ejected out Pharyngeal- Nectar Cup -- Pharyngeal -- Pharyngeal- Nectar Straw -- Pharyngeal -- Pharyngeal- Thin Teaspoon Delayed swallow initiation-vallecula;Reduced epiglottic inversion;Reduced anterior laryngeal mobility;Reduced laryngeal elevation;Reduced airway/laryngeal closure;Reduced tongue base retraction;Reduced pharyngeal peristalsis;Penetration/Aspiration before swallow;Pharyngeal residue - valleculae;Pharyngeal residue - pyriform;Compensatory strategies attempted (with notebox) Pharyngeal Material enters airway, CONTACTS cords and not ejected out Pharyngeal- Thin Cup NT Pharyngeal -- Pharyngeal- Thin Straw -- Pharyngeal -- Pharyngeal- Puree NT Pharyngeal -- Pharyngeal- Mechanical Soft -- Pharyngeal -- Pharyngeal- Regular -- Pharyngeal -- Pharyngeal- Multi-consistency -- Pharyngeal -- Pharyngeal- Pill -- Pharyngeal -- Pharyngeal Comment --  CHL IP CERVICAL ESOPHAGEAL PHASE 06/15/2015 Cervical Esophageal Phase Impaired Pudding Teaspoon -- Pudding Cup -- Honey Teaspoon -- Honey Cup -- Nectar Teaspoon Reduced cricopharyngeal relaxation Nectar Cup -- Nectar Straw -- Thin Teaspoon Reduced cricopharyngeal relaxation Thin Cup -- Thin Straw -- Puree -- Mechanical Soft -- Regular -- Multi-consistency -- Pill -- Cervical Esophageal Comment -- No flowsheet data found. Germain Osgood, M.A. CCC-SLP [phone] Germain Osgood 06/15/2015, 1:51 PM                   Scheduled Meds: . antiseptic oral rinse  7 mL Mouth Rinse QID  . aspirin  300 mg Rectal Daily  . atorvastatin  40 mg Per Tube q1800  . cefTRIAXone (ROCEPHIN)  IV  2 g Intravenous Q12H  . chlorhexidine gluconate  15 mL Mouth Rinse BID  . enoxaparin (LOVENOX) injection  40 mg Subcutaneous Q24H  . free water  100 mL Per Tube QID  . insulin aspart  0-9 Units Subcutaneous 6 times per day  . insulin glargine  10 Units  Subcutaneous Daily  . metoprolol tartrate  12.5 mg Per Tube BID  . pantoprazole sodium  40 mg Per Tube Q1200  . potassium chloride  40 mEq Per Tube Daily  . sodium chloride flush  10-40 mL Intracatheter Q12H   Continuous Infusions: . dextrose 50 mL/hr at 06/14/15 1750  . feeding supplement (JEVITY 1.2 CAL) 1,000 mL (06/15/15 1609)    Principal Problem:   Severe sepsis with septic shock ([place]) Active Problems:   Sepsis ([place])   CAP (community acquired pneumonia)   Encephalopathy   Encounter for central line placement   Lactic acidosis   Encounter for feeding tube placement   Stroke (cerebrum) ([place])   Research study patient   Ventilator dependence ([place])   Carotid thromboses   HLD (hyperlipidemia)   Acute respiratory failure ([place])   Infection due to Streptococcus pyogenes   Hematuria  Dysphagia   Stricture of esophagus   CVA (cerebral infarction)    Time spent: 30 minutes.    Vernell Leep, MD, FACP, FHM. [facility] Pager [phone]  If 7PM-7AM, please contact night-coverage [email or web address] Password [email or web address] 06/15/2015, 5:04 PM    LOS: 16 days

## 2015-06-15 NOTE — Progress Notes (Signed)
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 Pt wtih previous dx of flu 4 days prior to admission and started tamiflu. Ct (+) dense R infiltrated with volume loss  (+) PNA, group A strep bacteremia acute respiratory failure.  Pt intubated 05/29/15-06/08/15 PMH: sjogrens disease, hypothyroidism dysphagia seizures, adenopathy, scoliosis, spleen anomaly, anemnia, raynaud disease, osteopenia    PT Comments    Continuing to progress activity tolerance. R arm in sling today. Educated pt and husband on positioning. Pt continues to have increased fatigue and RR with all activity. Tolerated increased ambulation distance today on RA with +2 physical assist for stability with weight shifting for LE advancement. Will continue to follow pt acutely for transfer training and ambulation. Continuing to recommend CIR upon D/C as pt and family are highly motivated to get back towards baseline level of [place].  Follow Up Recommendations  CIR     Equipment Recommendations  None recommended by PT    Recommendations for Other Services       Precautions / Restrictions Precautions Precautions: Fall Precaution Comments: R hemiplegia Required Braces or Orthoses: Sling (for optimal support during ambulation) Restrictions Weight Bearing Restrictions: No    Mobility  Bed Mobility Overal bed mobility: Needs Assistance Bed Mobility: Supine to Sit;Sit to Supine Rolling: Min assist   Supine to sit: Min assist Sit to supine: Min assist   General bed mobility comments: Min A to bring trunk upright after rolling to the R to sit EOB, Min A bring LEs back onto bed to return to supine  Transfers Overall transfer level: Needs assistance Equipment used: 2 person hand held assist Transfers: Sit to/from Stand Sit to Stand: Min  assist;+2 physical assistance         General transfer comment: Pt displays large lateral lean and weight shift to achieve standing, +2 assist for stability, minimal LE buckling, increased RR  Ambulation/Gait Ambulation/Gait assistance: Min assist;Mod assist;+2 physical assistance Ambulation Distance (Feet): 20 Feet (70ft x 2) Assistive device: 2 person hand held assist Gait Pattern/deviations: Step-through pattern;Decreased stride length;Steppage;Ataxic;Scissoring;Trunk flexed Gait velocity: decreased Gait velocity interpretation: Below normal speed for age/gender General Gait Details: ambulated with R arm in sling, +2 assist for stability due to buckling in LEs with weight shifting, excessive trunk flexion able to fix for ~5 seconds with cueing   Stairs            Wheelchair Mobility    Modified Rankin (Stroke Patients Only) Modified Rankin (Stroke Patients Only) Pre-Morbid Rankin Score: No symptoms Modified Rankin: Moderately severe disability     Balance Overall balance assessment: Needs assistance Sitting-balance support: Feet supported;Single extremity supported Sitting balance-Leahy Scale: Fair     Standing balance support: Bilateral upper extremity supported;During functional activity Standing balance-Leahy Scale: Poor                      Cognition Arousal/Alertness: Awake/alert Behavior During Therapy: WFL for tasks assessed/performed;Anxious Overall Cognitive Status: Within Functional Limits for tasks assessed                      Exercises      General Comments General comments (skin integrity, edema, etc.): increased RR with all activity      Pertinent Vitals/Pain Pain Assessment: No/denies pain    Home Living  Prior Function            PT Goals (current goals can now be found in the care plan section) Acute Rehab PT Goals Patient Stated Goal: not get tired for swallow study Progress towards  PT goals: Progressing toward goals    Frequency  Min 3X/week    PT Plan Current plan remains appropriate    Co-evaluation             End of Session Equipment Utilized During Treatment: Back brace Activity Tolerance: Patient limited by fatigue Patient left: in bed;with call bell/phone within reach;Other (comment) (transport in room)     Time: [id number] PT Time Calculation (min) (ACUTE ONLY): 16 min  Charges:  $Gait Training: 8-22 mins                    G Codes:      Ara Kussmaul 07-04-2015, 12:23 PM  Ara Kussmaul, Student Physical Therapist Acute Rehab [phone]

## 2015-06-16 ENCOUNTER — Inpatient Hospital Stay (HOSPITAL_COMMUNITY)
Admission: RE | Admit: 2015-06-16 | Discharge: 2015-06-30 | DRG: 057 | Disposition: A | Discharge status: Home Health | Payer: BC Managed Care – PPO | Source: Intra-hospital | Attending: Physical Medicine & Rehabilitation | Admitting: Physical Medicine & Rehabilitation

## 2015-06-16 DIAGNOSIS — Z880 Allergy status to penicillin: Secondary | ICD-10-CM | POA: Diagnosis not present

## 2015-06-16 DIAGNOSIS — R040 Epistaxis: Secondary | ICD-10-CM | POA: Diagnosis not present

## 2015-06-16 DIAGNOSIS — R7881 Bacteremia: Secondary | ICD-10-CM | POA: Diagnosis present

## 2015-06-16 DIAGNOSIS — M069 Rheumatoid arthritis, unspecified: Secondary | ICD-10-CM | POA: Diagnosis not present

## 2015-06-16 DIAGNOSIS — Z881 Allergy status to other antibiotic agents status: Secondary | ICD-10-CM

## 2015-06-16 DIAGNOSIS — D6832 Hemorrhagic disorder due to extrinsic circulating anticoagulants: Secondary | ICD-10-CM | POA: Diagnosis not present

## 2015-06-16 DIAGNOSIS — I69359 Hemiplegia and hemiparesis following cerebral infarction affecting unspecified side: Secondary | ICD-10-CM | POA: Diagnosis not present

## 2015-06-16 DIAGNOSIS — T45525A Adverse effect of antithrombotic drugs, initial encounter: Secondary | ICD-10-CM | POA: Diagnosis not present

## 2015-06-16 DIAGNOSIS — I7300 Raynaud's syndrome without gangrene: Secondary | ICD-10-CM | POA: Diagnosis present

## 2015-06-16 DIAGNOSIS — E039 Hypothyroidism, unspecified: Secondary | ICD-10-CM | POA: Diagnosis present

## 2015-06-16 DIAGNOSIS — M419 Scoliosis, unspecified: Secondary | ICD-10-CM | POA: Diagnosis present

## 2015-06-16 DIAGNOSIS — R739 Hyperglycemia, unspecified: Secondary | ICD-10-CM | POA: Diagnosis present

## 2015-06-16 DIAGNOSIS — I69351 Hemiplegia and hemiparesis following cerebral infarction affecting right dominant side: Secondary | ICD-10-CM | POA: Diagnosis present

## 2015-06-16 DIAGNOSIS — T380X5A Adverse effect of glucocorticoids and synthetic analogues, initial encounter: Secondary | ICD-10-CM | POA: Diagnosis present

## 2015-06-16 DIAGNOSIS — K5900 Constipation, unspecified: Secondary | ICD-10-CM | POA: Diagnosis present

## 2015-06-16 DIAGNOSIS — I4892 Unspecified atrial flutter: Secondary | ICD-10-CM | POA: Diagnosis present

## 2015-06-16 DIAGNOSIS — B9620 Unspecified Escherichia coli [E. coli] as the cause of diseases classified elsewhere: Secondary | ICD-10-CM | POA: Diagnosis not present

## 2015-06-16 DIAGNOSIS — N390 Urinary tract infection, site not specified: Secondary | ICD-10-CM | POA: Diagnosis not present

## 2015-06-16 DIAGNOSIS — R1313 Dysphagia, pharyngeal phase: Secondary | ICD-10-CM | POA: Diagnosis present

## 2015-06-16 DIAGNOSIS — I69391 Dysphagia following cerebral infarction: Secondary | ICD-10-CM | POA: Diagnosis not present

## 2015-06-16 DIAGNOSIS — E785 Hyperlipidemia, unspecified: Secondary | ICD-10-CM | POA: Diagnosis present

## 2015-06-16 DIAGNOSIS — K8020 Calculus of gallbladder without cholecystitis without obstruction: Secondary | ICD-10-CM | POA: Diagnosis present

## 2015-06-16 DIAGNOSIS — K222 Esophageal obstruction: Secondary | ICD-10-CM | POA: Diagnosis present

## 2015-06-16 DIAGNOSIS — M3500 Sicca syndrome, unspecified: Secondary | ICD-10-CM | POA: Diagnosis present

## 2015-06-16 DIAGNOSIS — R319 Hematuria, unspecified: Secondary | ICD-10-CM | POA: Diagnosis not present

## 2015-06-16 DIAGNOSIS — I69991 Dysphagia following unspecified cerebrovascular disease: Secondary | ICD-10-CM | POA: Insufficient documentation

## 2015-06-16 DIAGNOSIS — Y92238 Other place in hospital as the place of occurrence of the external cause: Secondary | ICD-10-CM | POA: Diagnosis not present

## 2015-06-16 DIAGNOSIS — G3184 Mild cognitive impairment, so stated: Secondary | ICD-10-CM | POA: Diagnosis present

## 2015-06-16 DIAGNOSIS — Z931 Gastrostomy status: Secondary | ICD-10-CM

## 2015-06-16 DIAGNOSIS — Z882 Allergy status to sulfonamides status: Secondary | ICD-10-CM | POA: Diagnosis not present

## 2015-06-16 DIAGNOSIS — B954 Other streptococcus as the cause of diseases classified elsewhere: Secondary | ICD-10-CM | POA: Diagnosis present

## 2015-06-16 DIAGNOSIS — I639 Cerebral infarction, unspecified: Secondary | ICD-10-CM

## 2015-06-16 LAB — GLUCOSE, CAPILLARY
GLUCOSE-CAPILLARY: 124 mg/dL — AB (ref 65–99)
GLUCOSE-CAPILLARY: 169 mg/dL — AB (ref 65–99)
GLUCOSE-CAPILLARY: 83 mg/dL (ref 65–99)
GLUCOSE-CAPILLARY: 147 mg/dL — AB (ref 65–99)
Glucose-Capillary: 130 mg/dL — ABNORMAL HIGH (ref 65–99)

## 2015-06-16 LAB — CREATININE, SERUM: Creatinine, Ser: 0.51 mg/dL (ref 0.44–1.00)

## 2015-06-16 LAB — CBC
HEMATOCRIT: 30.8 % — AB (ref 36.0–46.0)
Hemoglobin: 9.4 g/dL — ABNORMAL LOW (ref 12.0–15.0)
MCH: 28.4 pg (ref 26.0–34.0)
MCHC: 30.5 g/dL (ref 30.0–36.0)
MCV: 93.1 fL (ref 78.0–100.0)
Platelets: 707 10*3/uL — ABNORMAL HIGH (ref 150–400)
RBC: 3.31 MIL/uL — AB (ref 3.87–5.11)
RDW: 19.9 % — ABNORMAL HIGH (ref 11.5–15.5)
WBC: 5.8 10*3/uL (ref 4.0–10.5)

## 2015-06-16 MED ORDER — INSULIN ASPART 100 UNIT/ML ~~LOC~~ SOLN
0.0000 [IU] | SUBCUTANEOUS | Status: DC
  Administered 2015-06-16: 1 [IU] via SUBCUTANEOUS
  Administered 2015-06-17: 3 [IU] via SUBCUTANEOUS
  Administered 2015-06-17: 2 [IU] via SUBCUTANEOUS
  Administered 2015-06-17: 1 [IU] via SUBCUTANEOUS
  Administered 2015-06-17: 2 [IU] via SUBCUTANEOUS
  Administered 2015-06-17 – 2015-06-18 (×2): 1 [IU] via SUBCUTANEOUS
  Administered 2015-06-18: 2 [IU] via SUBCUTANEOUS
  Administered 2015-06-18: 1 [IU] via SUBCUTANEOUS
  Administered 2015-06-18 (×3): 2 [IU] via SUBCUTANEOUS
  Administered 2015-06-19 (×2): 1 [IU] via SUBCUTANEOUS
  Administered 2015-06-19: 2 [IU] via SUBCUTANEOUS
  Administered 2015-06-19: 1 [IU] via SUBCUTANEOUS
  Administered 2015-06-19: 2 [IU] via SUBCUTANEOUS
  Administered 2015-06-20 (×3): 1 [IU] via SUBCUTANEOUS
  Administered 2015-06-20: 2 [IU] via SUBCUTANEOUS
  Administered 2015-06-20 – 2015-06-21 (×4): 1 [IU] via SUBCUTANEOUS
  Administered 2015-06-21 (×2): 2 [IU] via SUBCUTANEOUS
  Administered 2015-06-22 (×3): 1 [IU] via SUBCUTANEOUS
  Administered 2015-06-22: 2 [IU] via SUBCUTANEOUS
  Administered 2015-06-23 (×3): 1 [IU] via SUBCUTANEOUS

## 2015-06-16 MED ORDER — ASPIRIN 81 MG PO TABS: 81.0000 mg | ORAL_TABLET | Freq: Every day | ORAL | Status: DC

## 2015-06-16 MED ORDER — ACETAMINOPHEN 160 MG/5ML PO SOLN: 650.0000 mg | Freq: Four times a day (QID) | ORAL | Status: DC | PRN

## 2015-06-16 MED ORDER — ENOXAPARIN SODIUM 40 MG/0.4ML ~~LOC~~ SOLN
40.0000 mg | SUBCUTANEOUS | Status: DC
  Administered 2015-06-17 – 2015-06-29 (×13): 40 mg via SUBCUTANEOUS
  Filled 2015-06-16 (×13): qty 0.4

## 2015-06-16 MED ORDER — JEVITY 1.2 CAL PO LIQD
1000.0000 mL | ORAL | Status: DC
  Administered 2015-06-17: 1000 mL
  Filled 2015-06-16 (×3): qty 1000

## 2015-06-16 MED ORDER — ASPIRIN 81 MG PO CHEW
81.0000 mg | CHEWABLE_TABLET | Freq: Every day | ORAL | Status: DC
  Administered 2015-06-17 – 2015-06-30 (×14): 81 mg via ORAL
  Filled 2015-06-16 (×14): qty 1

## 2015-06-16 MED ORDER — CHLORHEXIDINE GLUCONATE 0.12% ORAL RINSE (MEDLINE KIT)
15.0000 mL | Freq: Two times a day (BID) | OROMUCOSAL | Status: DC
  Administered 2015-06-16: 15 mL via OROMUCOSAL

## 2015-06-16 MED ORDER — PANTOPRAZOLE SODIUM 40 MG PO PACK
40.0000 mg | PACK | Freq: Every day | ORAL | Status: DC
Start: 2015-06-17 — End: 2015-06-30
  Administered 2015-06-17 – 2015-06-28 (×11): 40 mg
  Filled 2015-06-16 (×8): qty 20

## 2015-06-16 MED ORDER — LEVOTHYROXINE SODIUM 112 MCG PO TABS
112.0000 ug | ORAL_TABLET | Freq: Every day | ORAL | Status: DC
  Administered 2015-06-17 – 2015-06-30 (×14): 112 ug via ORAL
  Filled 2015-06-16 (×14): qty 1

## 2015-06-16 MED ORDER — ENOXAPARIN SODIUM 40 MG/0.4ML ~~LOC~~ SOLN: 40.0000 mg | SUBCUTANEOUS | Status: DC

## 2015-06-16 MED ORDER — DEXTROSE 5 % IV SOLN: 2.0000 g | Freq: Two times a day (BID) | INTRAVENOUS | Status: DC

## 2015-06-16 MED ORDER — PANTOPRAZOLE SODIUM 40 MG PO PACK: 40.0000 mg | PACK | Freq: Every day | ORAL | Status: DC

## 2015-06-16 MED ORDER — FREE WATER
100.0000 mL | Freq: Four times a day (QID) | Status: DC
  Administered 2015-06-16 – 2015-06-29 (×50): 100 mL

## 2015-06-16 MED ORDER — METOPROLOL TARTRATE 25 MG/10 ML ORAL SUSPENSION
12.5000 mg | Freq: Two times a day (BID) | ORAL | Status: DC
  Administered 2015-06-17 – 2015-06-19 (×4): 12.5 mg
  Filled 2015-06-16 (×6): qty 10

## 2015-06-16 MED ORDER — ATORVASTATIN CALCIUM 40 MG PO TABS
40.0000 mg | ORAL_TABLET | Freq: Every day | ORAL | Status: DC
  Administered 2015-06-17: 40 mg
  Filled 2015-06-16: qty 1

## 2015-06-16 MED ORDER — PREDNISONE 5 MG/5ML PO SOLN: 10.0000 mg | Freq: Every day | ORAL | Status: DC

## 2015-06-16 MED ORDER — PREDNISONE 5 MG/5ML PO SOLN
10.0000 mg | Freq: Every day | ORAL | Status: DC
  Administered 2015-06-17 – 2015-06-30 (×14): 10 mg via ORAL
  Filled 2015-06-16 (×16): qty 10

## 2015-06-16 MED ORDER — INSULIN GLARGINE 100 UNIT/ML ~~LOC~~ SOLN
5.0000 [IU] | Freq: Every day | SUBCUTANEOUS | Status: DC
  Administered 2015-06-17 – 2015-06-23 (×7): 5 [IU] via SUBCUTANEOUS
  Filled 2015-06-16 (×8): qty 0.05

## 2015-06-16 MED ORDER — CLOPIDOGREL BISULFATE 75 MG PO TABS
75.0000 mg | ORAL_TABLET | Freq: Every day | ORAL | Status: DC
Start: 2015-06-16 — End: 2015-06-30

## 2015-06-16 MED ORDER — DEXTROSE 5 % IV SOLN
2.0000 g | Freq: Two times a day (BID) | INTRAVENOUS | Status: DC
  Administered 2015-06-16 – 2015-06-30 (×28): 2 g via INTRAVENOUS
  Filled 2015-06-16 (×29): qty 2

## 2015-06-16 MED ORDER — JEVITY 1.2 CAL PO LIQD: 1000.0000 mL | ORAL | Status: DC

## 2015-06-16 MED ORDER — METOPROLOL TARTRATE 25 MG/10 ML ORAL SUSPENSION: 12.5000 mg | Freq: Two times a day (BID) | ORAL | Status: DC

## 2015-06-16 MED ORDER — CLOPIDOGREL BISULFATE 75 MG PO TABS
75.0000 mg | ORAL_TABLET | Freq: Every day | ORAL | Status: DC
  Administered 2015-06-17 – 2015-06-30 (×14): 75 mg via ORAL
  Filled 2015-06-16 (×14): qty 1

## 2015-06-16 NOTE — Discharge Summary (Signed)
Physician Discharge Summary  [name] [name] [id number] DOB: 10-Feb-1956 DOA: 05/29/2015  PCP: No primary care provider on file.  Admit date: 05/29/2015 Discharge date: 06/16/2015  Time spent: 40 minutes  Recommendations for Outpatient Follow-up:  1. Follow-up with CIR M.D. 2. Continue Rocephin for total of 6 weeks, to be done on 07/09/2014.   Discharge Diagnoses:  Principal Problem:   Severe sepsis with septic shock ([place]) Active Problems:   Sepsis ([place])   CAP (community acquired pneumonia)   Encephalopathy   Encounter for central line placement   Lactic acidosis   Encounter for feeding tube placement   Stroke (cerebrum) ([place])   Research study patient   Ventilator dependence ([place])   Carotid thromboses   HLD (hyperlipidemia)   Acute respiratory failure ([facility])   Infection due to Streptococcus pyogenes   Hematuria   Dysphagia   Stricture of esophagus   CVA (cerebral infarction)   Discharge Condition: Stable  Diet recommendation: Nothing by mouth, tube feeding, all oral medication should be through the tube.  Filed Weights   06/13/15 1959 06/14/15 0500 06/15/15 2041  Weight: 53.978 kg (119 lb) 53.978 kg (119 lb) 55.792 kg (123 lb)    History of present illness:  [name] is a 60 y/o woman with a history of Sjogren's disease, hypothyroidism and dysphagia. She was diagnosed with the flu (A) about 4 days ago and started on tamiflu. According to her husband, she had actually been improving the 24 hours prior to admission with fever resolving and improvement in her nausea and vomiting. He reports that the evening of admission she developed weakness and altered mental status. She was brought to [facility] ED via ambulance. She was initially thought to be a code stroke with a right sided facial droop and flaccid paralysis of her right arm. Of note she does have a history of seizure but is not on antiepileptics and had no witnessed seizure activity. In the ED she was found to be in  respiratory distress and acutely hypoxic on a non-rebreather. A chest x-ray showed a dense right sided infiltrate with volume loss. She was also noted to have a diffuse red papular rash. She is on plaquenil for her Sjogren's disease but is not on any other immunosuppressive agents. 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   Hospital Course:  This is a discharge summary for prolonged and rather complex hospital stay for total of 17 days, I only seen this patient for today, please review the chart for further information.  Acute respiratory failure secondary to group A strep pneumonia - intubated 2/11- Extubated 2/21, patient was also treated for flu between 2/13-2/20, CIR consult pending approval, swallow eval , currently recommendation is to be NPO by speech therapy, Repeat chest x-ray today shows extensive air space consolidation on the right with partial interval clearing and left base infiltrate has cleared. Continue strict aspiration precautions.  Strep pyogenes bacteremia -Continue ceftriaxone, since we are unable to perform TEE due to esophageal stricture, will need 6 weeks (treating for endocarditis), defer TEE until stronger. Positive blood culture from 2/12 showing group A strep pansensitive. Patient was followed by infectious disease.   Septic shock -Secondary to influenza A, group A strep pneumonia and Streptococcus pyogenes bacteremia. Required ICU admission, intubation, vasopressors. Shock resolved,   Dysphagia -Secondary to CVA, last MBSS done by SLP recommended nothing by mouth. -Patient has PEG tube, on tube feeding continue. Nutrition to follow to adjust tube feeding  Atrial flutter with  rapid ventricular response resolved, - EKG shows atrial flutter with variable A-V block 2/15 , 2-D echo on 2/16 showed EF of 60-65%, continue metoprolol.  - According to [name] [name] note from 2/21, he does not feel patient needs anticoagulation. - This is felt to  be transient if flutter secondary to patient pneumonia/sepsis and likely not the cause of CVA. - Neurology recommended aspirin and Plavix.  Hematuria - Seems to have resolved. Anticoagulation held.  Diabetes mellitus - patient currently on Lantus and SSI but without feeds due to newly placed PEG tube.  - No further hypoglycemic episodes.  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 Hypercoagulable workup in the future per neuro to rule out antiphospholipid antibody syndrome..EEG spike activity from the left frontal lobe, likely associated with current stroke - Start aspirin and Plavix after PEG tube placement  Transaminitis  - nml 2/14 , noted high 2/20, now decreasing, ? Right upper quadrant ultrasound shows cholelithiasis - Unclear etiology, now improving.  multiple autoimmune conditions with Sjgren's, rheumatoid arthritis,  - On Plaquenil and prednisone, started on discharge.  Esophageal stricture- - PEG tube placed 2/27. May need outpatient EGD with endoscopy for dilatation in 6-8 weeks.  Hematuria Held heparin drip for 3 hours, and not restarted due to ongoing hematuria  Will need urology consult if continues despite holding heparin Renal ultrasound to rule out nephrolithiasis, negative   Protein calorie malnutrition  - Tube feeding started  Hypothyroid - Continue Synthroid.  Candidiasis - Completed fluconazole treatment  Acute kidney injury  - resolved.  Volume overload  - Secondary to IV fluid resuscitation in the hospital. Resolved. Lasix  discontinued.   Anemia - Stable  Thrombocytosis - ? Reactive.   Procedures:  Placement of PEG tube done by radiology on 06/14/2015.  Extubation on 06/08/2015  EEG on 06/07/2015 without lateralization features on the examination.  Bronchoscopy done on 06/03/2015 by Dr. Titus Mould  EEG on 06/02/2015 showed general slowing.  Placement of CVC done by Dr. Lincoln Maxin on 05/30/2015.  Lumbar puncture done by Dr. Lincoln Maxin on 05/30/2015  Consultations:  PCCM.  Neurology.  ID  Discharge Exam: Filed Vitals:   06/16/15 0416 06/16/15 0821  BP: 103/60 116/65  Pulse: 81 95  Temp: 97.9 F (36.6 C) 97.7 F (36.5 C)  Resp: 16 17  General: Alert and awake, oriented x3, not in any acute distress. HEENT: anicteric sclera, pupils reactive to light and accommodation, EOMI CVS: S1-S2 clear, no murmur rubs or gallops Chest: clear to auscultation bilaterally, no wheezing, rales or rhonchi Abdomen: soft nontender, nondistended, normal bowel sounds, no organomegaly Extremities: no cyanosis, clubbing or edema noted bilaterally Neuro: Cranial nerves II-XII intact, no focal neurological deficits  Discharge Instructions    Current Discharge Medication List    START taking these medications   Details  aspirin 81 MG tablet Place 1 tablet (81 mg total) into feeding tube daily.    cefTRIAXone 2 g in dextrose 5 % 50 mL Inject 2 g into the vein every 12 (twelve) hours.    clopidogrel (PLAVIX) 75 MG tablet Place 1 tablet (75  mg total) into feeding tube daily.    metoprolol tartrate (LOPRESSOR) 25 mg/10 mL SUSP Place 5 mLs (12.5 mg total) into feeding tube 2 (two) times daily.    Nutritional Supplements (FEEDING SUPPLEMENT, JEVITY 1.2 CAL,) LIQD Place 1,000 mLs into feeding tube continuous. Refills: 0    pantoprazole sodium (PROTONIX) 40 mg/20 mL PACK Place 20 mLs (40 mg total) into feeding tube daily at 12 noon. Qty: 30 each    predniSONE 5 MG/5ML solution Take 10 mLs (10 mg total) by  mouth daily with breakfast.      CONTINUE these medications which have NOT CHANGED   Details  cetirizine (ZYRTEC) 10 MG tablet Take 10 mg by mouth daily.    conjugated estrogens (PREMARIN) vaginal cream Place 1 Applicatorful vaginally 2 (two) times a week.    fluticasone (FLONASE) 50 MCG/ACT nasal spray Place 2 sprays into both nostrils daily.    hydroxychloroquine (PLAQUENIL) 200 MG tablet Take by mouth daily.    levothyroxine (SYNTHROID, LEVOTHROID) 112 MCG tablet Take 112 mcg by mouth daily before breakfast.    Multiple Vitamin (MULTIVITAMIN) tablet Take 1 tablet by mouth daily.    vitamin B-12 (CYANOCOBALAMIN) 1000 MCG tablet Take 1,000 mcg by mouth daily.      STOP taking these medications     HYDROcodone-homatropine (HYCODAN) 5-1.5 MG/5ML syrup      ondansetron (ZOFRAN-ODT) 4 MG disintegrating tablet      oseltamivir (TAMIFLU) 75 MG capsule      predniSONE (DELTASONE) 10 MG tablet      predniSONE (DELTASONE) 20 MG tablet      tri-vitamin w/ fluoride (TRI-VI-SOL) 0.25 MG/ML solution        Allergies  Allergen Reactions  . Erythromycin Ethylsuccinate Hives  . Keflex [Cephalexin] Hives  . Penicillins   . Sulfa Antibiotics       The results of significant diagnostics from this hospitalization (including imaging, microbiology, ancillary and laboratory) are listed below for reference.    Significant Diagnostic Studies: Ct Angio Head W/cm &/or Wo Cm  06/03/2015  CLINICAL DATA:  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 Right facial droop and right arm paralysis. History of Sjogren's disease. EXAM: CT ANGIOGRAPHY HEAD AND NECK TECHNIQUE: Multidetector CT imaging of the head and neck was performed using the standard protocol during bolus administration of intravenous contrast. Multiplanar CT image reconstructions and MIPs were obtained to evaluate the vascular anatomy. Carotid stenosis measurements (when applicable) are obtained utilizing NASCET criteria,  using the distal internal carotid diameter as the denominator. CONTRAST:  88mL OMNIPAQUE IOHEXOL 350 MG/ML SOLN COMPARISON:  Brain MRI 06/03/2015 FINDINGS: CT HEAD Brain: Focal hypoattenuation in the posterior left frontal lobe involving the precentral gyrus corresponds to the acute infarct described on the recent MRI without interval change in size. Ventricles are normal in size. There is no evidence of acute intracranial hemorrhage, mass, midline shift, or extra-axial fluid collection. Calvarium and skull base: No fracture or destructive skull lesion. Paranasal sinuses: Small amount of fluid in the left maxillary and left sphenoid sinuses. Mild right maxillary sinus mucosal thickening. Small bilateral mastoid effusions. Orbits: Unremarkable. CTA NECK Aortic arch: 3 vessel aortic arch. Brachiocephalic and subclavian arteries are widely patent. Right carotid system: Patent without evidence of stenosis, dissection, or significant atherosclerosis. Left carotid system: There is a 7 x 3 mm filling defect projecting from the medial wall of the distal common carotid artery into the lumen just proximal to the bifurcation. The remainder of the common carotid artery  as well as the cervical ICA are unremarkable allowing for slight limitation due to prominent metallic dental streak artifact through the mid ICA. Vertebral arteries: The vertebral arteries are widely patent with the left being slightly larger than the right. Skeleton: Mild-to-moderate C5-6 disc degeneration. Other neck: Endotracheal tube in place. Ground-glass and consolidative opacities with air bronchograms partially visualized in the right upper lobe. Right pleural effusion partially visualized. Enteric tube partially visualized. Small or absent thyroid. Both submandibular glands appear atrophic. CTA HEAD Anterior circulation: The internal carotid arteries are patent from skullbase to carotid termini the without stenosis. Minimal calcified plaque is noted at  the anterior genu on the right. ACAs and MCAs are patent without evidence of significant stenosis or major branch occlusion. There is slight asymmetric attenuation of small, distal left MCA branch vessels in the area of acute infarction compared to the contralateral side. No intracranial aneurysm is identified. Posterior circulation: The intracranial vertebral arteries are patent with the left being dominant. The right vertebral artery is particularly hypoplastic distal to the PICA origin. PICA, AICA, and SCA origins are patent. The basilar artery is small in caliber diffusely without evidence of a significant focal stenosis. There are fetal type origins of both PCAs. No significant PCA stenosis is seen. Venous sinuses: Patent. Anatomic variants: Fetal origins of the PCAs. Delayed phase: No abnormal enhancement. IMPRESSION: 1. 7 mm filling defect in the distal left common carotid artery consistent with a small focus of thrombus. 2. Otherwise unremarkable appearance of the cervical carotid and vertebral arteries. 3. No major intracranial arterial branch occlusion or significant stenosis. Slight attenuation of distal left MCA branch vessels in the region of the left frontal infarct. 4. Extensive right lung consolidation, incompletely visualized. Small right pleural effusion. These results were called by telephone at the time of interpretation on 06/03/2015 at 4:18 pm to Dr. Lake Bells, who verbally acknowledged these results. Electronically Signed   By: Logan Bores M.D.   On: 06/03/2015 16:22   Ct Head Wo Contrast  05/30/2015  CLINICAL DATA:  Acute onset of right-sided facial droop and altered mental status. Right arm weakness. Initial encounter. EXAM: CT HEAD WITHOUT CONTRAST TECHNIQUE: Contiguous axial images were obtained from the base of the skull through the vertex without intravenous contrast. COMPARISON:  None. FINDINGS: There is no evidence of acute infarction, mass lesion, or intra- or extra-axial hemorrhage  on CT. The posterior fossa, including the cerebellum, brainstem and fourth ventricle, is within normal limits. The third and lateral ventricles, and basal ganglia are unremarkable in appearance. The cerebral hemispheres are symmetric in appearance, with normal gray-white differentiation. No mass effect or midline shift is seen. There is no evidence of fracture; visualized osseous structures are unremarkable in appearance. The visualized portions of the orbits are within normal limits. Mild mucosal thickening is noted at the maxillary sinuses bilaterally and at the ethmoid air cells. The remaining paranasal sinuses and mastoid air cells are well-aerated. No significant soft tissue abnormalities are seen. IMPRESSION: 1. No definite acute intracranial pathology seen on CT. 2. Mild mucosal thickening at the maxillary sinuses bilaterally. Electronically Signed   By: Garald Balding M.D.   On: 05/30/2015 03:23   Ct Angio Neck W/cm &/or Wo/cm  06/03/2015  CLINICAL DATA:  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 Right facial droop and right arm paralysis. History of Sjogren's disease. EXAM: CT ANGIOGRAPHY HEAD AND NECK TECHNIQUE: Multidetector CT imaging of the head and neck was performed using the standard protocol during bolus  administration of intravenous contrast. Multiplanar CT image reconstructions and MIPs were obtained to evaluate the vascular anatomy. Carotid stenosis measurements (when applicable) are obtained utilizing NASCET criteria, using the distal internal carotid diameter as the denominator. CONTRAST:  59mL OMNIPAQUE IOHEXOL 350 MG/ML SOLN COMPARISON:  Brain MRI 06/03/2015 FINDINGS: CT HEAD Brain: Focal hypoattenuation in the posterior left frontal lobe involving the precentral gyrus corresponds to the acute infarct described on the recent MRI without interval change in size. Ventricles are normal in size. There is no evidence of acute intracranial hemorrhage, mass, midline shift, or  extra-axial fluid collection. Calvarium and skull base: No fracture or destructive skull lesion. Paranasal sinuses: Small amount of fluid in the left maxillary and left sphenoid sinuses. Mild right maxillary sinus mucosal thickening. Small bilateral mastoid effusions. Orbits: Unremarkable. CTA NECK Aortic arch: 3 vessel aortic arch. Brachiocephalic and subclavian arteries are widely patent. Right carotid system: Patent without evidence of stenosis, dissection, or significant atherosclerosis. Left carotid system: There is a 7 x 3 mm filling defect projecting from the medial wall of the distal common carotid artery into the lumen just proximal to the bifurcation. The remainder of the common carotid artery as well as the cervical ICA are unremarkable allowing for slight limitation due to prominent metallic dental streak artifact through the mid ICA. Vertebral arteries: The vertebral arteries are widely patent with the left being slightly larger than the right. Skeleton: Mild-to-moderate C5-6 disc degeneration. Other neck: Endotracheal tube in place. Ground-glass and consolidative opacities with air bronchograms partially visualized in the right upper lobe. Right pleural effusion partially visualized. Enteric tube partially visualized. Small or absent thyroid. Both submandibular glands appear atrophic. CTA HEAD Anterior circulation: The internal carotid arteries are patent from skullbase to carotid termini the without stenosis. Minimal calcified plaque is noted at the anterior genu on the right. ACAs and MCAs are patent without evidence of significant stenosis or major branch occlusion. There is slight asymmetric attenuation of small, distal left MCA branch vessels in the area of acute infarction compared to the contralateral side. No intracranial aneurysm is identified. Posterior circulation: The intracranial vertebral arteries are patent with the left being dominant. The right vertebral artery is particularly  hypoplastic distal to the PICA origin. PICA, AICA, and SCA origins are patent. The basilar artery is small in caliber diffusely without evidence of a significant focal stenosis. There are fetal type origins of both PCAs. No significant PCA stenosis is seen. Venous sinuses: Patent. Anatomic variants: Fetal origins of the PCAs. Delayed phase: No abnormal enhancement. IMPRESSION: 1. 7 mm filling defect in the distal left common carotid artery consistent with a small focus of thrombus. 2. Otherwise unremarkable appearance of the cervical carotid and vertebral arteries. 3. No major intracranial arterial branch occlusion or significant stenosis. Slight attenuation of distal left MCA branch vessels in the region of the left frontal infarct. 4. Extensive right lung consolidation, incompletely visualized. Small right pleural effusion. These results were called by telephone at the time of interpretation on 06/03/2015 at 4:18 pm to Dr. Lake Bells, who verbally acknowledged these results. Electronically Signed   By: Logan Bores M.D.   On: 06/03/2015 16:22   Ct Angio Chest Pe W/cm &/or Wo Cm  05/30/2015  CLINICAL DATA:  60 year old female with hypoxia EXAM: CT ANGIOGRAPHY CHEST WITH CONTRAST TECHNIQUE: Multidetector CT imaging of the chest was performed using the standard protocol during bolus administration of intravenous contrast. Multiplanar CT image reconstructions and MIPs were obtained to evaluate the vascular anatomy. CONTRAST:  137mL OMNIPAQUE IOHEXOL 350 MG/ML SOLN COMPARISON:  Radiograph dated 05/30/2015 FINDINGS: There are large consolidative changes with air bronchogram involving the right upper lobe, right middle lobe, and right lower lobe. Scattered ground-glass and nodular density noted in the right apical region. There is a patchy area of consolidative change with air bronchogram in the left lower lobe with scattered ground-glass and nodular airspace opacity in the left lower lobe and lingula. No pleural effusion  or pneumothorax. An endotracheal tube is noted with tip above the carina. The central airways are patent. The thoracic aorta appears unremarkable. There is mild prominence of the pulmonary trunk concerning for a degree of pulmonary hypertension. There is no CT evidence of pulmonary embolism. There bilateral hilar adenopathy. There is no cardiomegaly or pericardial effusion. The esophagus is grossly unremarkable. The chest wall soft tissues appear unremarkable. The osseous structures are intact. A paddle megaly. The visualized upper abdomen is otherwise grossly unremarkable. Review of the MIP images confirms the above findings. IMPRESSION: No CT evidence of pulmonary embolism. Extensive bilateral consolidative changes and scattered ground-glass pulmonary nodules compatible with pneumonia. Clinical correlation and follow-up resolution recommended Electronically Signed   By: Anner Crete M.D.   On: 05/30/2015 03:26   Mr Brain Wo Contrast  06/03/2015  CLINICAL DATA:  Weakness and altered mental status, diagnosed with flu 4 days ago. RIGHT facial droop and RIGHT arm paralysis. History of Sjogren's disease. EXAM: MRI HEAD WITHOUT CONTRAST TECHNIQUE: Multiplanar, multiecho pulse sequences of the brain and surrounding structures were obtained without intravenous contrast. COMPARISON:  CT head May 30, 2015 FINDINGS: 22 x 11 mm area of reduced diffusion LEFT posterior frontal lobe, precentral gyrus with FLAIR T2 hyperintense signal and low ADC values. No susceptibility artifact to suggest hemorrhage. Local mass effect without midline shift. Ventricles and sulci are normal for patient's age. A few scattered subcentimeter supratentorial white matter FLAIR T2 hyperintensities exclusive of the aforementioned abnormality compatible with mild chronic small vessel ischemic disease. No abnormal extra-axial fluid collections. No extra-axial masses though, contrast enhanced sequences would be more sensitive. Normal major  intracranial vascular flow voids seen at the skull base. Ocular globes and orbital contents are unremarkable though not tailored for evaluation. No abnormal sellar expansion. Moderate paranasal sinus mucosal thickening with scattered air-fluid levels, life-support lines in place. Bilateral mastoid effusions. 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 Mild chronic small vessel ischemic disease. Electronically Signed   By: Elon Alas M.D.   On: 06/03/2015 01:37   Ir Gastrostomy Tube Mod Sed  06/14/2015  INDICATION: Stroke, pneumonia, dysphagia, sepsis EXAM: FLUOROSCOPIC 20 FRENCH PULL-THROUGH GASTROSTOMY Date:  2/27/20172/27/2017 11:13 am Radiologist:  M. Daryll Brod, MD Guidance:  Fluoroscopic MEDICATIONS: Patient is already receiving Rocephin 1 g IV Q 12 hours.; Glucagon 1 mg IV ANESTHESIA/SEDATION: Versed 1.0 mg IV; Fentanyl 50 mcg IV Moderate Sedation Time:  20 The patient was continuously monitored during the procedure by the interventional radiology nurse under my direct supervision. CONTRAST:  43mL OMNIPAQUE IOHEXOL 300 MG/ML SOLN - administered into the gastric lumen. FLUOROSCOPY TIME:  Fluoroscopy Time: 4 minutes 54 seconds (25 mGy). COMPLICATIONS: None immediate. PROCEDURE: Informed consent was obtained from the patient following explanation of the procedure, risks, benefits and alternatives. The patient understands, agrees and consents for the procedure. All questions were addressed. A time out was performed. Maximal barrier sterile technique utilized including caps, mask, sterile gowns, sterile gloves, large sterile drape, hand hygiene, and betadine prep. The left upper quadrant was sterilely  prepped and draped. An oral gastric catheter was inserted into the stomach under fluoroscopy. The existing nasogastric feeding tube was removed. Air was injected into the stomach for insufflation and visualization under fluoroscopy. The air distended stomach was  confirmed beneath the anterior abdominal wall in the frontal and lateral projections. Under sterile conditions and local anesthesia, a 69 gauge trocar needle was utilized to access the stomach percutaneously beneath the left subcostal margin. Needle position was confirmed within the stomach under biplane fluoroscopy. Contrast injection confirmed position also. A single T tack was deployed for gastropexy. Over an Amplatz guide wire, a 9-French sheath was inserted into the stomach. A snare device was utilized to capture the oral gastric catheter. The snare device was pulled retrograde from the stomach up the esophagus and out the oropharynx. The 20-French pull-through gastrostomy was connected to the snare device and pulled antegrade through the oropharynx down the esophagus into the stomach and then through the percutaneous tract external to the patient. The gastrostomy was assembled externally. Contrast injection confirms position in the stomach. Images were obtained for documentation. The patient tolerated procedure well. No immediate complication. IMPRESSION: Fluoroscopic insertion of a 20-French "pull-through" gastrostomy. Electronically Signed   By: Jerilynn Mages.  Shick M.D.   On: 06/14/2015 11:39   US Renal  06/11/2015  CLINICAL DATA:  Gross hematuria EXAM: RENAL / URINARY TRACT ULTRASOUND COMPLETE COMPARISON:  None. FINDINGS: Right Kidney: Length: 12.1 cm. There is normal echogenicity. No hydronephrosis. A mid to upper pole cyst measures 8 x 8 mm. Left Kidney: Length: 12.3 cm in length. There is mild fullness of left extrarenal pelvis without frank hydronephrosis. No renal calculi are noted. Bladder: Not visualized empty. IMPRESSION: 1. No right hydronephrosis. A cyst in right kidney mid to upper pole measures 8 x 8 mm. 2. There is mild fullness of left extrarenal pelvis without frank hydronephrosis. No renal calculi. 3. The urinary bladder is not visualized empty. Electronically Signed   By: Lahoma Crocker M.D.   On:  06/11/2015 10:59   Dg Chest Port 1 View  06/14/2015  CLINICAL DATA:  Pneumonia EXAM: PORTABLE CHEST 1 VIEW COMPARISON:  June 10, 2015 FINDINGS: There has been partial clearing of airspace consolidation on the right, primarily in the lower lobe region. Fairly extensive airspace consolidation in the periphery of the right mid and lower lung zones does remain. There has been interval clearing of patchy infiltrate from the left base with only mild atelectasis in this area currently present. No new opacity on the left. Heart size and pulmonary vascularity are normal. No adenopathy. Enteric tube tip is below the diaphragm. Central catheter tip is in the superior vena cava near but slightly superior to the cavoatrial junction. No pneumothorax. IMPRESSION: Extensive airspace consolidation remains on the right, although there has been partial interval clearing on the right compared to recent prior study. Infiltrate from the left base has cleared with only mild atelectasis remaining in this area. No new opacity. No change in cardiac silhouette. Electronically Signed   By: Lowella Grip III M.D.   On: 06/14/2015 07:30   Dg Chest Port 1 View  06/10/2015  CLINICAL DATA:  Respiratory failure and short of breath EXAM: PORTABLE CHEST 1 VIEW COMPARISON:  06/08/2015 FINDINGS: Extensive infiltrate in the right lung shows progression. No effusion on the right. Mild left lower lobe infiltrate shows mild progression. No effusion on the left. Feeding tube enters the stomach with the tip not visualized. IMPRESSION: Progression of bilateral infiltrate right greater than left  consistent with pneumonia. Electronically Signed   By: Franchot Gallo M.D.   On: 06/10/2015 07:13   Dg Chest Port 1 View  06/08/2015  CLINICAL DATA:  Respiratory failure EXAM: PORTABLE CHEST 1 VIEW COMPARISON:  06/07/2015 FINDINGS: Cardiac shadow is stable. A feeding catheter is again identified. Patchy infiltrate is noted throughout the right lung  stable from the prior exam. Some slight increased density is now seen in the left base which may represent some early infiltrate as well. No other focal abnormality is noted. IMPRESSION: Stable right sided pneumonia. New left basilar infiltrative density. Electronically Signed   By: Inez Catalina M.D.   On: 06/08/2015 10:11   Dg Chest Port 1 View  06/07/2015  CLINICAL DATA:  Acute respiratory failure. EXAM: PORTABLE CHEST 1 VIEW COMPARISON:  06/06/2015. FINDINGS: Normal sized heart. No significant change in right lung airspace opacity. The clear left lung. Endotracheal tube in satisfactory position. Feeding tube extending into the stomach. Unremarkable bones. IMPRESSION: Stable right lung pneumonia. Electronically Signed   By: Claudie Revering M.D.   On: 06/07/2015 07:12   Dg Chest Port 1 View  06/06/2015  CLINICAL DATA:  Acute respiratory failure. Followup exam for pneumonia. EXAM: PORTABLE CHEST 1 VIEW COMPARISON:  06/04/2015 FINDINGS: Airspace consolidation in the right lung has mildly improved. There are no new areas of lung opacity. Endotracheal tube and enteric tube are stable in well positioned. IMPRESSION: 1. Mild improvement in the right lung pneumonia. 2. No new abnormalities. No other change. Support apparatus remains well-positioned. Electronically Signed   By: Lajean Manes M.D.   On: 06/06/2015 07:53   Dg Chest Port 1 View  06/04/2015  CLINICAL DATA:  Hypoxia EXAM: PORTABLE CHEST 1 VIEW COMPARISON:  June 03, 2015 FINDINGS: Endotracheal tube tip is 2.7 cm above the carina. Feeding tube tip is below the diaphragm. No pneumothorax. There is extensive airspace consolidation in portions of the right mid and lower lung zones, slightly less than 1 day prior. Mild patchy infiltrate in the left base is stable. No new opacity is seen. Heart is upper normal in size with pulmonary vascularity within normal limits. IMPRESSION: Extensive airspace consolidation throughout right mid and lower lung zones,  slightly less than 1 day prior. Suspect pneumonia. Mild patchy infiltrate in the left base is also present, stable. No change in cardiac silhouette. Tube and catheter positions are as described without pneumothorax. Electronically Signed   By: Lowella Grip III M.D.   On: 06/04/2015 07:57   Dg Chest Port 1 View  06/03/2015  CLINICAL DATA:  Post bronchoscopy, intubation EXAM: PORTABLE CHEST 1 VIEW COMPARISON:  Portable exam 1624 hours compared to 0457 hours FINDINGS: Tip of endotracheal tube projects 5.4 cm above carina. Nasogastric tube extends to at least mid stomach. Stable heart size and mediastinal contours for rotation to the LEFT. Slight pulmonary vascular congestion. Diffuse airspace infiltrate RIGHT upper and RIGHT lower lobes compatible with pneumonia. Minimal LEFT lower lobe infiltrate questioned. No pneumothorax. Bones unremarkable. IMPRESSION: Persistent diffuse RIGHT lung infiltrates with question minimal LEFT lower lobe infiltrate favoring pneumonia. Electronically Signed   By: Lavonia Dana M.D.   On: 06/03/2015 16:40   Dg Chest Port 1 View  06/03/2015  CLINICAL DATA:  Endotracheal intubation. EXAM: PORTABLE CHEST 1 VIEW COMPARISON:  06/02/2015 FINDINGS: Examination is limited due to patient rotation. Endotracheal tube is present with tip measuring about 2.7 cm above the carina. An enteric tube is present. The tip is off the field of view but is  below the left hemidiaphragm. Heart size and pulmonary vascularity are normal. Diffuse airspace disease in the right mid and lower lung zone likely representing pneumonia. Small right pleural effusion. Mild left perihilar infiltration. No pneumothorax. Right central venous catheter appears to have been removed. IMPRESSION: Appliances appear in satisfactory location. Right mid and lower lung zone infiltrates with small right pleural effusion suggesting pneumonia. Mild left perihilar infiltration. No change since previous study, lying for differences in  patient positioning. Electronically Signed   By: Lucienne Capers M.D.   On: 06/03/2015 05:57   Dg Chest Port 1 View  06/02/2015  CLINICAL DATA:  Endotracheal placement EXAM: PORTABLE CHEST 1 VIEW COMPARISON:  Earlier same day FINDINGS: Endotracheal tube has its tip 3 cm above the carina. Soft feeding tube enters the abdomen. Right subclavian central line has its tip in the SVC above the right atrium. Bilateral lower lobe pneumonia right worse than left persists. Small amount of pleural fluid on the right. No new finding. IMPRESSION: Endotracheal tube well position with its tip 3 cm above the carina. Bilateral lower lobe pneumonia right worse than left. Electronically Signed   By: Nelson Chimes M.D.   On: 06/02/2015 14:49   Dg Chest Port 1 View  06/02/2015  CLINICAL DATA:  Hypoxia/respiratory failure EXAM: PORTABLE CHEST 1 VIEW COMPARISON:  June 01, 2015 FINDINGS: Endotracheal tube tip is 1.3 cm above the carina. Central catheter tip is in the superior vena cava. Feeding tube tip is below the diaphragm. No pneumothorax. There is extensive airspace consolidation through much of the right mid and lower lung zones. There is patchy consolidation in the medial left base. Heart size and pulmonary vascularity are normal. No adenopathy. IMPRESSION: Tube and catheter positions as described without pneumothorax. Persistent extensive consolidation throughout much the right lung. There is patchy airspace consolidation in the medial left base, marginally increased from 1 day prior. No change in cardiac silhouette. Electronically Signed   By: Lowella Grip III M.D.   On: 06/02/2015 07:28   Dg Chest Port 1 View  06/01/2015  CLINICAL DATA:  Bilateral pneumonia. EXAM: PORTABLE CHEST 1 VIEW COMPARISON:  May 30, 2015. FINDINGS: Stable cardiomediastinal silhouette. Endotracheal tube is in grossly good position and unchanged. Interval placement of feeding tube which is seen entering stomach. Right subclavian  catheter line is unchanged in position. No pneumothorax is noted. Stable large right lung pneumonia is noted. Stable minimal opacity is seen in left lung base. IMPRESSION: Stable large right lower lobe pneumonia. Endotracheal tube and right subclavian catheter are unchanged. Interval placement of feeding tube seen entering stomach Electronically Signed   By: Marijo Conception, M.D.   On: 06/01/2015 09:30   Dg Chest Port 1 View  05/30/2015  CLINICAL DATA:  Central line placement.  Initial encounter. EXAM: PORTABLE CHEST 1 VIEW COMPARISON:  Chest radiograph and CTA of the chest performed earlier today at 3:01 a.m. FINDINGS: A right subclavian line is noted ending about the cavoatrial junction. The patient's endotracheal tube is seen ending 2-3 cm above the carina. Dense right-sided and mild left basilar airspace opacities are again noted, compatible with multifocal pneumonia. No pleural effusion or pneumothorax is seen. The cardiomediastinal silhouette is normal in size. No acute osseous abnormalities are identified. IMPRESSION: 1. Right subclavian line noted ending about the cavoatrial junction. 2. Endotracheal tube seen ending 2-3 cm above the carina. 3. Bilateral pneumonia, worse on the right. Electronically Signed   By: Garald Balding M.D.   On: 05/30/2015 05:35  Dg Chest Port 1 View  05/30/2015  CLINICAL DATA:  Status post endotracheal tube placement. Initial encounter. EXAM: PORTABLE CHEST 1 VIEW COMPARISON:  Chest radiograph performed earlier today at 12:09 a.m. FINDINGS: The patient's endotracheal tube is seen ending 3-4 cm above the carina. Worsening right-sided pneumonia is noted. Mild patchy left basilar airspace opacity is also seen. No pleural effusion or pneumothorax is identified. The cardiomediastinal silhouette is borderline normal in size. No acute osseous abnormalities are identified. IMPRESSION: 1. Endotracheal tube seen ending 3-4 cm above the carina. 2. Worsening right-sided pneumonia noted.  Mild patchy left basilar airspace opacity also seen. Electronically Signed   By: Garald Balding M.D.   On: 05/30/2015 02:13   Dg Chest Port 1 View  05/30/2015  CLINICAL DATA:  60 year old female with hypoxia. EXAM: PORTABLE CHEST 1 VIEW COMPARISON:  CT dated 08/11/2005 FINDINGS: There is a large area of consolidative change involving right mid and lower lung field with air bronchograms compatible with pneumonia. Underlying mass is not excluded. Clinical correlation and follow-up resolution is recommended. The left lung is clear. No pleural effusion or pneumothorax. Cardiac silhouette is within normal limits. The osseous structures are unremarkable. IMPRESSION: Large right lung consolidative changes as described. Clinical correlation and follow-up to resolution recommended. Electronically Signed   By: Anner Crete M.D.   On: 05/30/2015 00:26   Dg Abd Portable 1v  05/31/2015  CLINICAL DATA:  Feeding tube placement EXAM: PORTABLE ABDOMEN - 1 VIEW COMPARISON:  None. FINDINGS: Feeding tube with the tip projecting over the proximal duodenum. There is no bowel dilatation to suggest obstruction. There is no evidence of pneumoperitoneum, portal venous gas or pneumatosis. There are no pathologic calcifications along the expected course of the ureters. The osseous structures are unremarkable. IMPRESSION: Feeding tube with the tip projecting over the proximal duodenum. Electronically Signed   By: Kathreen Devoid   On: 05/31/2015 13:08   Dg Swallowing Func-speech Pathology  06/15/2015  Objective Swallowing Evaluation: Type of Study: MBS-Modified Barium Swallow Study Patient Details Name: Shanterra Moyer MRN: [id number] Date of Birth: 1956-04-09 Today's Date: 06/15/2015 Time: SLP Start Time (ACUTE ONLY): 1206-SLP Stop Time (ACUTE ONLY): 1223 SLP Time Calculation (min) (ACUTE ONLY): 17 min Past Medical History: Past Medical History Diagnosis Date . Hypothyroidism  . Seizures ([place])  . Adenopathy  . Scoliosis  . Spleen anomaly   . Anemia  . Sjogren's disease ([place])  . Raynaud disease  . Shingles  . Osteopenia  Past Surgical History: Past Surgical History Procedure Laterality Date . Tonsillectomy   . Breast cyst aspiration Right 1990's   neg HPI: Ms/ Uvalle is a 60 y/o woman with a history of Sjogren's disease, hypothyroidism and dysphagia. She was diagnosed with the flu (A) about 4 days PTA and started on tamiflu. 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  Intubated from 2/12 to 2/21.There is a question of esophageal stricture; pt was seen by GI at [facility], complained of globus with pills and some solids, occuring 2-3x a week. Was scheduled for an EGD at [facility], but appears to have cancelled the procedure. 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 Subjective: pt alert, without complaints Assessment / Plan / Recommendation CHL IP CLINICAL IMPRESSIONS 06/15/2015 Therapy Diagnosis Moderate pharyngeal phase dysphagia;Moderate cervical esophageal phase dysphagia Clinical Impression Pt shows mild-moderate improvements since initial MBS, although still with reduced airway protection and pharyngeal clearance.  Hyolaryngeal movement is reduced and minimal epiglottic deflection results in poor airway protection. Delay in swallow initiation results in small amounts of silent penetration to the vocal cords with tsp of thin and nectar thick liquids. Cough is strong but not entirely effective at expelling penetrates. A chin tuck prevents penetration with nectar thick liquids, but even tsp-sized boluses required multiple swallows to try to clear diffuse pharyngeal residue. While it is not yet safe for her to resume a PO diet, would recommend use of therapeutic boluses of nectar thick liquids by spoon with SLP only. Impact on safety and function Moderate aspiration risk;Severe aspiration risk   CHL IP TREATMENT  RECOMMENDATION 06/15/2015 Treatment Recommendations Therapy as outlined in treatment plan below   Prognosis 06/15/2015 Prognosis for Safe Diet Advancement Fair Barriers to Reach Goals Severity of deficits Barriers/Prognosis Comment -- CHL IP DIET RECOMMENDATION 06/15/2015 SLP Diet Recommendations Alternative means - temporary;NPO Liquid Administration via -- Medication Administration Via alternative means Compensations -- Postural Changes --   CHL IP OTHER RECOMMENDATIONS 06/15/2015 Recommended Consults -- Oral Care Recommendations Oral care QID Other Recommendations --   CHL IP FOLLOW UP RECOMMENDATIONS 06/15/2015 Follow up Recommendations Inpatient Rehab   CHL IP FREQUENCY AND DURATION 06/15/2015 Speech Therapy Frequency (ACUTE ONLY) min 3x week Treatment Duration 2 weeks      CHL IP ORAL PHASE 06/15/2015 Oral Phase WFL Oral - Pudding Teaspoon -- Oral - Pudding Cup -- Oral - Honey Teaspoon -- Oral - Honey Cup -- Oral - Nectar Teaspoon -- Oral - Nectar Cup -- Oral - Nectar Straw -- Oral - Thin Teaspoon -- Oral - Thin Cup -- Oral - Thin Straw -- Oral - Puree -- Oral - Mech Soft -- Oral - Regular -- Oral - Multi-Consistency -- Oral - Pill -- Oral Phase - Comment --  CHL IP PHARYNGEAL PHASE 06/15/2015 Pharyngeal Phase Impaired Pharyngeal- Pudding Teaspoon -- Pharyngeal -- Pharyngeal- Pudding Cup -- Pharyngeal -- Pharyngeal- Honey Teaspoon NT Pharyngeal -- Pharyngeal- Honey Cup -- Pharyngeal -- Pharyngeal- Nectar Teaspoon Delayed swallow initiation-vallecula;Reduced epiglottic inversion;Reduced anterior laryngeal mobility;Reduced laryngeal elevation;Reduced airway/laryngeal closure;Reduced tongue base retraction;Reduced pharyngeal peristalsis;Penetration/Aspiration before swallow;Pharyngeal residue - valleculae;Pharyngeal residue - pyriform;Compensatory strategies attempted (with notebox) Pharyngeal Material enters airway, CONTACTS cords and not ejected out Pharyngeal- Nectar Cup -- Pharyngeal -- Pharyngeal- Nectar Straw --  Pharyngeal -- Pharyngeal- Thin Teaspoon Delayed swallow initiation-vallecula;Reduced epiglottic inversion;Reduced anterior laryngeal mobility;Reduced laryngeal elevation;Reduced airway/laryngeal closure;Reduced tongue base retraction;Reduced pharyngeal peristalsis;Penetration/Aspiration before swallow;Pharyngeal residue - valleculae;Pharyngeal residue - pyriform;Compensatory strategies attempted (with notebox) Pharyngeal Material enters airway, CONTACTS cords and not ejected out Pharyngeal- Thin Cup NT Pharyngeal -- Pharyngeal- Thin Straw -- Pharyngeal -- Pharyngeal- Puree NT Pharyngeal -- Pharyngeal- Mechanical Soft -- Pharyngeal -- Pharyngeal- Regular -- Pharyngeal -- Pharyngeal- Multi-consistency -- Pharyngeal -- Pharyngeal- Pill -- Pharyngeal -- Pharyngeal Comment --  CHL IP CERVICAL ESOPHAGEAL PHASE 06/15/2015 Cervical Esophageal Phase Impaired Pudding Teaspoon -- Pudding Cup -- Honey Teaspoon -- Honey Cup -- Nectar Teaspoon Reduced cricopharyngeal relaxation Nectar Cup -- Nectar Straw -- Thin Teaspoon Reduced cricopharyngeal relaxation Thin Cup -- Thin Straw -- Puree -- Mechanical Soft -- Regular -- Multi-consistency -- Pill -- Cervical Esophageal Comment -- No flowsheet data found. Germain Osgood, M.A. CCC-SLP [phone] Germain Osgood 06/15/2015, 1:51 PM              Dg Swallowing Func-speech Pathology  06/09/2015  Objective Swallowing Evaluation: Type of Study: MBS-Modified Barium Swallow Study Patient Details Name: Nyala Cundiff MRN: [id number] Date of Birth: 1955/06/26 Today's Date: 06/09/2015  Time: SLP Start Time (ACUTE ONLY): 1010-SLP Stop Time (ACUTE ONLY): 1035 SLP Time Calculation (min) (ACUTE ONLY): 25 min Past Medical History: Past Medical History Diagnosis Date . Hypothyroidism  . Seizures ([place])  . Adenopathy  . Scoliosis  . Spleen anomaly  . Anemia  . Sjogren's disease ([place])  . Raynaud disease  . Shingles  . Osteopenia  Past Surgical History: Past Surgical History Procedure Laterality Date .  Tonsillectomy   . Breast cyst aspiration Right 1990's   neg HPI: Ms/ Karwoski is a 60 y/o woman with a history of Sjogren's disease, hypothyroidism and dysphagia. She was diagnosed with the flu (A) about 4 days PTA and started on tamiflu. 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  Intubated from 2/12 to 2/21.There is a question of esophageal stricture; pt was seen by GI at [facility], complained of globus with pills and some solids, occuring 2-3x a week. Was scheduled for an EGD at [facility], but appears to have cancelled the procedure. 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 No Data Recorded Assessment / Plan / Recommendation CHL IP CLINICAL IMPRESSIONS 06/09/2015 Therapy Diagnosis Moderate cervical esophageal phase dysphagia;Severe pharyngeal phase dysphagia Clinical Impression Pt presents with a severe multifactoral dysphagia with sensory and motor deficits resulting in gross silent aspiration of thin liquids and penetration of nectar. Swallow is delayed, and laryngeal closure is incomplete, likely a result of prolonged intubation. More significant is noteably decreased opening of UES resulting is severe residuals with puree and thickened liquids. There is decreased hyolaryngeal excursion and poor pharyngeal constriction for bolus propulsion. 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 This type of deficit is often seen with Sjogren's disease and would could have played a role in occult aspiration contributing to pna. Of course, pt is severely deconditioned at this time, has a new CVA, has an NG tube impeding movement of structures and was intubated for 9 days, all of which play a role in pts dysphagia. Recommend pt remain NPO  with short term alternate means of nutrition with close monitoring for progress. Will need repeat MBS to determine readiness for PO.  Impact on safety and function Severe aspiration risk   CHL IP TREATMENT RECOMMENDATION 06/09/2015 Treatment Recommendations F/U MBS in --- days (Comment)   Prognosis 06/09/2015 Prognosis for Safe Diet Advancement Fair Barriers to Reach Goals Severity of deficits Barriers/Prognosis Comment -- CHL IP DIET RECOMMENDATION 06/09/2015 SLP Diet Recommendations NPO;Alternative means - temporary Liquid Administration via -- Medication Administration Via alternative means Compensations -- Postural Changes --   CHL IP OTHER RECOMMENDATIONS 06/09/2015 Recommended Consults -- Oral Care Recommendations Oral care QID Other Recommendations --   CHL IP FOLLOW UP RECOMMENDATIONS 06/09/2015 Follow up Recommendations Inpatient Rehab   CHL IP FREQUENCY AND DURATION 06/09/2015 Speech Therapy Frequency (ACUTE ONLY) min 3x week Treatment Duration 2 weeks      CHL IP ORAL PHASE 06/09/2015 Oral Phase WFL Oral - Pudding Teaspoon -- Oral - Pudding Cup -- Oral - Honey Teaspoon -- Oral - Honey Cup -- Oral - Nectar Teaspoon -- Oral - Nectar Cup -- Oral - Nectar Straw -- Oral - Thin Teaspoon -- Oral - Thin Cup -- Oral - Thin Straw -- Oral - Puree -- Oral - Mech Soft --  Oral - Regular -- Oral - Multi-Consistency -- Oral - Pill -- Oral Phase - Comment --  CHL IP PHARYNGEAL PHASE 06/09/2015 Pharyngeal Phase Impaired Pharyngeal- Pudding Teaspoon -- Pharyngeal -- Pharyngeal- Pudding Cup -- Pharyngeal -- Pharyngeal- Honey Teaspoon Delayed swallow initiation-vallecula;Reduced epiglottic inversion;Reduced pharyngeal peristalsis;Reduced anterior laryngeal mobility;Reduced airway/laryngeal closure;Pharyngeal residue - valleculae;Pharyngeal residue - pyriform Pharyngeal -- Pharyngeal- Honey Cup -- Pharyngeal -- Pharyngeal- Nectar Teaspoon Delayed swallow initiation-vallecula;Reduced epiglottic inversion;Reduced pharyngeal  peristalsis;Reduced anterior laryngeal mobility;Reduced airway/laryngeal closure;Pharyngeal residue - valleculae;Pharyngeal residue - pyriform;Penetration/Apiration after swallow;Penetration/Aspiration during swallow;Penetration/Aspiration before swallow Pharyngeal Material enters airway, passes BELOW cords without attempt by patient to eject out (silent aspiration);Material enters airway, CONTACTS cords and not ejected out Pharyngeal- Nectar Cup -- Pharyngeal -- Pharyngeal- Nectar Straw -- Pharyngeal -- Pharyngeal- Thin Teaspoon -- Pharyngeal -- Pharyngeal- Thin Cup Delayed swallow initiation-vallecula;Reduced epiglottic inversion;Reduced pharyngeal peristalsis;Reduced anterior laryngeal mobility;Reduced airway/laryngeal closure;Pharyngeal residue - valleculae;Pharyngeal residue - pyriform;Penetration/Aspiration before swallow;Penetration/Aspiration during swallow;Penetration/Apiration after swallow;Significant aspiration (Amount) Pharyngeal Material enters airway, passes BELOW cords without attempt by patient to eject out (silent aspiration) Pharyngeal- Thin Straw -- Pharyngeal -- Pharyngeal- Puree Delayed swallow initiation-vallecula;Reduced epiglottic inversion;Reduced pharyngeal peristalsis;Reduced anterior laryngeal mobility;Reduced airway/laryngeal closure;Pharyngeal residue - valleculae;Pharyngeal residue - pyriform Pharyngeal -- Pharyngeal- Mechanical Soft -- Pharyngeal -- Pharyngeal- Regular -- Pharyngeal -- Pharyngeal- Multi-consistency -- Pharyngeal -- Pharyngeal- Pill -- Pharyngeal -- Pharyngeal Comment --  CHL IP CERVICAL ESOPHAGEAL PHASE 06/09/2015 Cervical Esophageal Phase Impaired Pudding Teaspoon -- Pudding Cup -- Honey Teaspoon -- Honey Cup -- Nectar Teaspoon -- Nectar Cup -- Nectar Straw -- Thin Teaspoon -- Thin Cup -- Thin Straw -- Puree -- Mechanical Soft -- Regular -- Multi-consistency -- Pill -- Cervical Esophageal Comment see impression No flowsheet data found. DeBlois, Katherene Ponto  06/09/2015, 11:25 AM              US Abdomen Limited Ruq  06/09/2015  CLINICAL DATA:  Elevated LFTs EXAM: US ABDOMEN LIMITED - RIGHT UPPER QUADRANT COMPARISON:  None. FINDINGS: Gallbladder: Gallstones are identified. No wall thickening or pericholecystic fluid is noted. Negative sonographic Percell Miller sign is noted. Common bile duct: Diameter: 1.9 mm. Liver: No focal lesion identified. Within normal limits in parenchymal echogenicity. Incidental note is made of a small right-sided pleural effusion. IMPRESSION: Cholelithiasis. Right pleural effusion. Electronically Signed   By: Inez Catalina M.D.   On: 06/09/2015 21:50    Microbiology: Recent Results (from the past 240 hour(s))  Culture, respiratory (NON-Expectorated)     Status: None   Collection Time: 06/08/15  9:40 AM  Result Value Ref Range Status   Specimen Description TRACHEAL ASPIRATE  Final   Special Requests Immunocompromised  Final   Gram Stain   Final    ABUNDANT WBC PRESENT, PREDOMINANTLY PMN NO SQUAMOUS EPITHELIAL CELLS SEEN NO ORGANISMS SEEN Performed at [facility]    Culture   Final    FEW YEAST CONSISTENT WITH CANDIDA SPECIES Performed at [facility]    Report Status 06/11/2015 FINAL  Final     Labs: Basic Metabolic Panel:  Recent Labs Lab 06/10/15 0400 06/12/15 0500 06/13/15 0507 06/14/15 0456  NA 136 137 136 141  K 4.4 4.2 4.4 4.2  CL 104 102 100* 102  CO2 23 28 26 28   GLUCOSE 138* 126* 144* 86  BUN 30* 29* 49* 31*  CREATININE 0.76 0.52 1.50* 0.57  CALCIUM 8.3* 8.5* 8.6* 9.0  MG  --   --  2.0  --    Liver Function Tests:  Recent Labs Lab  06/10/15 0400 06/12/15 0500  AST 85* 76*  ALT 65* 56*  ALKPHOS 515* 472*  BILITOT 0.2* 0.5  PROT 6.3* 6.4*  ALBUMIN 1.7* 1.6*   No results for input(s): LIPASE, AMYLASE in the last 168 hours. No results for input(s): AMMONIA in the last 168 hours. CBC:  Recent Labs Lab 06/11/15 1340 06/12/15 0500 06/12/15 1608 06/13/15 0507  06/14/15 0456  WBC 17.2* 12.1* 13.8* 12.4* 9.1  NEUTROABS  --   --  9.2* 7.9*  --   HGB 9.0* 8.5* 10.1* 9.4* 9.2*  HCT 29.6* 28.6* 32.0* 30.2* 30.7*  MCV 93.7 94.7 93.3 92.9 95.3  PLT 716* 726* 821* 870* 800*   Cardiac Enzymes: No results for input(s): CKTOTAL, CKMB, CKMBINDEX, TROPONINI in the last 168 hours. BNP: BNP (last 3 results)  Recent Labs  05/30/15 0009  BNP 149.0*    ProBNP (last 3 results) No results for input(s): PROBNP in the last 8760 hours.  CBG:  Recent Labs Lab 06/15/15 2039 06/15/15 2352 06/16/15 0410 06/16/15 0750 06/16/15 1213  GLUCAP 105* 127* 130* 124* 169*       Signed:  Taneah Masri A MD.  [facility] 06/16/2015, 2:54 PM

## 2015-06-16 NOTE — Interval H&P Note (Signed)
My Bongiovanni was admitted today to Inpatient Rehabilitation with the diagnosis of left frontal lobe infarct complicated by respiratory failure.  The patient's history has been reviewed, patient examined, and there is no change in status.  Patient continues to be appropriate for intensive inpatient rehabilitation.  I have reviewed the patient's chart and labs.  Questions were answered to the patient's satisfaction. The PAPE has been reviewed and assessment remains appropriate.  Narvel Kozub Lorie Phenix 06/16/2015, 8:37 PM

## 2015-06-16 NOTE — Progress Notes (Signed)
Pt admitted to unit at 1830 with husband bedside. RN reviewed rehab booklet, process, and safety plan with verbal understanding from pt and husband. Droplet precautions in place. Husband at bedside with call bell within reach.

## 2015-06-16 NOTE — PMR Pre-admission (Signed)
PMR Admission Coordinator Pre-Admission Assessment  Patient: [name] [name] is an 60 y.o., female MRN: [id number] DOB: October 10, 1955 Height: 5\' 2"  (157.5 cm) Weight: 55.792 kg (123 lb)              Insurance Information HMO:   PPO:      PCP:      IPA:      80/20:  yes     OTHER:   PRIMARY: [place]      Policy#: [id number]      Subscriber:self CM Name:  Levada Dy      Phone#:  [phone]     Fax#:  [phone] Pre-Cert#:  [id number], approved for 14 days with clinical updates due following conference on 3/151/7      Employer:  [employer] Benefits:  Phone #:  [phone]     Name:  online Eff. Date:  04/18/15     Deduct:  $1250      Out of Pocket Max:  $4350      Life Max:  n/a CIR:  80%/20%      SNF:  80%/20% Outpatient:  80%     Co-Pay:  20% Home Health:  80%/      Co-Pay: 20% DME:  80%     Co-Pay:  20% Providers:  In network SECONDARY:      Policy#:       Subscriber:  CM Name:       Phone#:      Fax#:  Pre-Cert#:       Employer:  Benefits:  Phone #:      Name:  Eff. Date:      Deduct:       Out of Pocket Max:       Life Max:  CIR:       SNF:  Outpatient:      Co-Pay:  Home Health:       Co-Pay:  DME:      Co-Pay:   Medicaid Application Date:       Case Manager:  Disability Application Date:       Case Worker:   Emergency Contact Information Contact Information    Name Relation Home Work Mobile   [name],[name] Spouse [phone]       Current Medical History  Patient Admitting Diagnosis: left frontal infarct History of Present Illness:[name] [name] is a 60 y.o.right handed female with history of Sjogrens disease/rheumatoid arthritis maintained on plaquenil and Prednisone, noted history of seizure but not on antiepileptic meds and dysphagia. Patient diagnosed 4 days prior withFlu and placed on Tamiflu. Lives with spouse independent prior to admission. 2 level home with bedroom on main level. 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  Presented 05/29/2015 with right side weakness and gait instability. Patient with severe restrictive airway distress when he presented to the ED with oxygen saturations in the 70s requiring nonrebreathing mask and later intubated with slow extubation follow-up per critical care pulmonary services. Chest x-ray showed large right lung consolidative changes. CT angiogram chest no evidence of pulmonary emboli. Cranial CT scan negative. 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 Echocardiogram with ejection fraction of 65% no wall motion abnormalities. 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 EEG was negative for seizure. Patient did not receive TPA. Blood cultures showed group A strep pansensitive 05/30/2015 and currently maintained on Rocephin with droplet contact precautions with question duration of antibiotics. Hospital course atrial  flutter currently maintained Lovenox 40 mg daily and asymptomatic with low dose beta blocker added. Modified barium swallow 06/09/2015 shows severe pharyngeal dysphagia as well as esophageal stricture. Patient underwent gastrostomy tube per interventional radiology 06/14/2015. Currently maintained on Lovenox for DVT prophylaxis. Plan is to begin aspirin/ Plavix therapy for CVA prophylaxis after PEG tube feeds initiated. Plan for EGD with endoscopy in 6-8 weeks as outpatient for dilatation of stricture. Physical therapy evaluation completed 06/09/2015 with recommendations of physical medicine rehabilitation consult. Patient was admitted for a comprehensive  Total: 7 NIH      Past Medical History  Past Medical History  Diagnosis Date  . Hypothyroidism   . Seizures ([place])   . Adenopathy   . Scoliosis   . Spleen anomaly   . Anemia   . Sjogren's disease ([place])   . Raynaud disease   . Shingles   . Osteopenia     Family History  family history includes Breast cancer (age of onset: 60) in her maternal  grandfather.  Prior Rehab/Hospitalizations:  Has the patient had major surgery during 100 days prior to admission? No  Current Medications   Current facility-administered medications:  .  0.9 %  sodium chloride infusion, 250 mL, Intravenous, PRN, Guy Begin, MD, Stopped at 06/02/15 1738 .  acetaminophen (TYLENOL) solution 650 mg, 650 mg, Per Tube, Q6H PRN, Colbert Coyer, MD, 650 mg at 06/06/15 1344 .  antiseptic oral rinse (BIOTENE) solution 15 mL, 15 mL, Mouth Rinse, PRN, Lavina Hamman, MD, 15 mL at 06/14/15 1231 .  antiseptic oral rinse solution (CORINZ), 7 mL, Mouth Rinse, QID, Kara Mead V, MD, 7 mL at 06/16/15 1600 .  aspirin suppository 300 mg, 300 mg, Rectal, Daily, Reyne Dumas, MD, 300 mg at 06/16/15 1100 .  atorvastatin (LIPITOR) tablet 40 mg, 40 mg, Per Tube, q1800, Modena Jansky, MD, 40 mg at 06/16/15 1733 .  cefTRIAXone (ROCEPHIN) 2 g in dextrose 5 % 50 mL IVPB, 2 g, Intravenous, Q12H, Raylene Miyamoto, MD, 2 g at 06/16/15 1100 .  chlorhexidine gluconate (PERIDEX) 0.12 % solution 15 mL, 15 mL, Mouth/Throat, BID, Lauren D Bajbus, RPH, 15 mL at 06/16/15 1133 .  dextrose 5 % solution, , Intravenous, Continuous, Modena Jansky, MD, Last Rate: 50 mL/hr at 06/16/15 1200 .  enoxaparin (LOVENOX) injection 40 mg, 40 mg, Subcutaneous, Q24H, Reyne Dumas, MD, 40 mg at 06/16/15 1100 .  feeding supplement (JEVITY 1.2 CAL) liquid 1,000 mL, 1,000 mL, Per Tube, Continuous, Modena Jansky, MD, Last Rate: 60 mL/hr at 06/16/15 1200, 1,000 mL at 06/16/15 1200 .  free water 100 mL, 100 mL, Per Tube, QID, Modena Jansky, MD, 100 mL at 06/16/15 1800 .  Gerhardt's butt cream, , Topical, PRN, Kara Mead V, MD .  insulin aspart (novoLOG) injection 0-9 Units, 0-9 Units, Subcutaneous, 6 times per day, Modena Jansky, MD, 2 Units at 06/16/15 1300 .  insulin glargine (LANTUS) injection 5 Units, 5 Units, Subcutaneous, Daily, Modena Jansky, MD, 5 Units at 06/16/15 1100 .   metoprolol tartrate (LOPRESSOR) 25 mg/10 mL oral suspension 12.5 mg, 12.5 mg, Per Tube, BID, Raylene Miyamoto, MD, 12.5 mg at 06/16/15 1100 .  pantoprazole sodium (PROTONIX) 40 mg/20 mL oral suspension 40 mg, 40 mg, Per Tube, Q1200, Kara Mead V, MD, 40 mg at 06/16/15 1140 .  potassium chloride 20 MEQ/15ML (10%) solution 40 mEq, 40 mEq, Per Tube, Daily, Modena Jansky, MD, 40 mEq at 06/16/15 1100 .  sodium chloride flush (NS) 0.9 % injection 10-40 mL, 10-40 mL, Intracatheter, Q12H, Reyne Dumas, MD, 10 mL at 06/13/15 2200 .  sodium chloride flush (NS) 0.9 % injection 10-40 mL, 10-40 mL, Intracatheter, PRN, Reyne Dumas, MD  Patients Current Diet: Diet NPO time specified  Precautions / Restrictions Precautions Precautions: Fall Precaution Comments: R hemiplegia Restrictions Weight Bearing Restrictions: No   Has the patient had 2 or more falls or a fall with injury in the past year?No  Prior Activity Level Community (5-7x/wk): Pt. is a Oncologist at [employer] / [place] Devices/Equipment: None Home Equipment: Shower seat, Shower seat - built in  Prior Device Use: Indicate devices/aids used by the patient prior to current illness, exacerbation or injury? None of the above  Prior Functional Level Prior Function Level of Independence: Independent  Self Care: Did the patient need help bathing, dressing, using the toilet or eating?  Independent  Indoor Mobility: Did the patient need assistance with walking from room to room (with or without device)? Independent  Stairs: Did the patient need assistance with internal or external stairs (with or without device)? Independent  Functional Cognition: Did the patient need help planning regular tasks such as shopping or remembering to take medications? Independent  Current Functional Level Cognition  Overall Cognitive Status: Within Functional Limits for tasks  assessed Orientation Level: Oriented X4 General Comments: Fixated on "dry sensation in mouth.    Extremity Assessment (includes Sensation/Coordination)  Upper Extremity Assessment: Defer to OT evaluation RUE Deficits / Details: demonstrates shoulder extension, shoulder flexion shoulder abduction shoulder adduction, elbow flexion / extension supination . Pt does not actively demonstrate hand or wrist activation at this time. pt educated on using L UE at wrist to(A) R UE.  RUE Coordination: decreased fine motor, decreased gross motor  Lower Extremity Assessment: RLE deficits/detail RLE Deficits / Details: Grossly ~2+/5 hip flexion, 3/5 knee extension/flexion and 2+/5 DF. RLE Sensation: decreased light touch RLE Coordination: decreased fine motor, decreased gross motor    ADLs  Overall ADL's : Needs assistance/impaired Eating/Feeding: NPO Eating/Feeding Details (indicate cue type and reason): nurse assisted pt with oral swab Grooming: Applying deodorant, Sitting, Minimal assistance Grooming Details (indicate cue type and reason): assisted in placing deodorant in pt's left hand and with moving RUE Upper Body Bathing: Sitting, Moderate assistance Upper Body Dressing : Sitting, Moderate assistance Lower Body Dressing: Maximal assistance, Bed level Lower Body Dressing Details (indicate cue type and reason): OT don sock over toes and pt able with (A) to pull sock over bil feet Toilet Transfer: Moderate assistance, +2 for physical assistance, Stand-pivot, BSC Toileting- Clothing Manipulation and Hygiene: Total assistance, Sit to/from stand, +2 for physical assistance Functional mobility during ADLs: Moderate assistance, +2 for physical assistance General ADL Comments: Encouraged pt to be trying to use RUE. Educated on UB dressing technique.     Mobility  Overal bed mobility: Needs Assistance Bed Mobility: Supine to Sit Rolling: Min assist Supine to sit: Supervision Sit to supine: Min  assist General bed mobility comments: Min A to bring trunk upright after rolling to the R to sit EOB, Min A bring LEs back onto bed to return to supine    Transfers  Overall transfer level: Needs assistance Equipment used: 2 person hand held assist Transfers: Sit to/from Stand, Stand Pivot Transfers Sit to Stand: Min assist, +2 safety/equipment Stand pivot transfers: Mod assist, +2 physical assistance General transfer comment: Less assist for first stand pivot to [facility]  and then pt transferred from there to chair.     Ambulation / Gait / Stairs / Wheelchair Mobility  Ambulation/Gait Ambulation/Gait assistance: Min assist, Mod assist, +2 physical assistance Ambulation Distance (Feet): 20 Feet (75ft x 2) Assistive device: 2 person hand held assist Gait Pattern/deviations: Step-through pattern, Decreased stride length, Steppage, Ataxic, Scissoring, Trunk flexed General Gait Details: ambulated with R arm in sling, +2 assist for stability due to buckling in LEs with weight shifting, excessive trunk flexion able to fix for ~5 seconds with cueing Gait velocity: decreased Gait velocity interpretation: Below normal speed for age/gender    Posture / Balance Dynamic Sitting Balance Sitting balance - Comments: sits EOB x about 10 min  Balance Overall balance assessment: Needs assistance Sitting-balance support: Feet supported, Single extremity supported Sitting balance-Leahy Scale: Fair Sitting balance - Comments: sits EOB x about 10 min  Standing balance support: Bilateral upper extremity supported, During functional activity Standing balance-Leahy Scale: Poor Standing balance comment: min/mod support and L hand on back of chair for support    Special needs/care consideration BiPAP/CPAP   no CPM   no Continuous Drip IV   Dextrose 5% 16ml/hr Dialysis no        Life Vest   no Oxygen  Special Bed   no Trach Size   no Wound Vac (area)   no       Skin   Back abrasion                               Bowel mgmt:   Last BM 06/16/15, loose per pt. Bladder mgmt: using BSC with assist , continent Diabetic mgmt      Previous Home Environment Living Arrangements: Spouse/significant other Available Help at Discharge: Family, Friend(s), Available 24 hours/day Type of Home: House Home Layout: Two level, Able to live on main level with bedroom/bathroom, Full bath on main level Home Access: Stairs to enter [employer] of Steps: 3 Bathroom Shower/Tub: Gaffer, Chiropodist: [place]: No Additional Comments: Pt's husband is a Theme park manager and states he has tremendous flexibility in his schedule.  I explained to him that pt. will need 24 hour assist for an unknown time.  He states he has a strong support network and that he will definitely be able to arrange 24 hour care  Discharge Living Setting Plans for Discharge Living Setting: Patient's home Type of Home at Discharge: House Discharge Home Layout: Two level, Able to live on main level with bedroom/bathroom Discharge Home Access: Stairs to enter Entrance Stairs-Rails: None Entrance Stairs-Number of Steps: 3 Discharge Bathroom Shower/Tub: Tub/shower unit, Walk-in shower Discharge Bathroom Toilet: Standard Discharge Bathroom Accessibility: Yes Does the patient have any problems obtaining your medications?: No  Social/Family/Support Systems Patient Roles: Spouse, Parent, Other (Comment) (has 5 grandchildren) Contact Information: husband Khailyn Crocitto, [phone] Ability/Limitations of Caregiver: Coralyn Mark pastors a church but has a very flexible schedule.  He states that pt. will never be left alone as he has a strong support Media planner Availability: 24/7 (initially 24/7) Discharge Plan Discussed with Primary Caregiver: Yes Is Caregiver In Agreement with Plan?: Yes Does Caregiver/Family have Issues with Lodging/Transportation while Pt is in Rehab?: No   Goals/Additional  Needs Patient/Family Goal for Rehab: supervision and min assist PT/OT/SLP Expected length of stay: 18-21 days Cultural Considerations: [employer] Dietary Needs: NPO, has PEG  Equipment Needs: TBA Pt/Family Agrees to Admission and willing to participate: Yes Program  Orientation Provided & Reviewed with Pt/Caregiver Including Roles  & Responsibilities: Yes   Decrease burden of Care through IP rehab admission: no   Possible need for SNF placement upon discharge:   Not anticipated   Patient Condition: This patient's medical and functional status has changed since the consult dated: 06/10/15  in which the Rehabilitation Physician determined and documented that the patient's condition is appropriate for intensive rehabilitative care in an inpatient rehabilitation facility. See "History of Present Illness" (above) for medical update. Functional changes are:+2 min and mod assist for transfer, ambulated 20' x 2 min and mod assist . Patient's medical and functional status update has been discussed with the Rehabilitation physician and patient remains appropriate for inpatient rehabilitation. Will admit to inpatient rehab today.  Preadmission Screen Completed By:  Gerlean Ren, 06/16/2015 5:42 PM ______________________________________________________________________   Discussed status with Dr.  Posey Pronto on 06/16/15 at  1742  and received telephone approval for admission today.  Admission Coordinator:  Gerlean Ren, time [id number] /Date 06/16/15

## 2015-06-16 NOTE — Progress Notes (Addendum)
Occupational Therapy Treatment Patient Details Name: [name] [name] MRN: [id number] DOB: 1955-06-29 Today's Date: 06/16/2015    History of present illness 60 y.o. female admitted with L Frontal CVA presumed embolic from L CCA thrombus. Pt wtih previous dx of flu 4 days prior to admission and started tamiflu. Ct (+) dense R infiltrated with volume loss  (+) PNA, group A strep bacteremia acute respiratory failure.  Pt intubated 05/29/15-06/08/15 PMH: sjogrens disease, hypothyroidism dysphagia seizures, adenopathy, scoliosis, spleen anomaly, anemnia, raynaud disease, osteopenia   OT comments  Pt progressing. Education provided in session. Continue to recommend CIR for rehab.  Follow Up Recommendations  CIR    Equipment Recommendations  Other (comment) (defer to next venue)    Recommendations for Other Services Rehab consult    Precautions / Restrictions Precautions Precautions: Fall Precaution Comments: R hemiplegia Required Braces or Orthoses: Sling (for optimal support during ambulation) Restrictions Weight Bearing Restrictions: No       Mobility Bed Mobility Overal bed mobility: Needs Assistance Bed Mobility: Supine to Sit     Supine to sit: Supervision        Transfers Overall transfer level: Needs assistance   Transfers: Sit to/from Stand;Stand Pivot Transfers Sit to Stand: Min assist;+2 safety/equipment Stand pivot transfers: Mod assist;+2 physical assistance       General transfer comment: Less assist for first stand pivot to [facility] and then pt transferred from there to chair.     Balance      Assist for sit to stand and stand pivot transfer.                             ADL Overall ADL's : Needs assistance/impaired   Eating/Feeding Details (indicate cue type and reason): nurse assisted pt with oral swab Grooming: Applying deodorant;Sitting;Minimal assistance Grooming Details (indicate cue type and reason): assisted in placing deodorant in pt's left  hand and with moving RUE Upper Body Bathing: Sitting;Moderate assistance (pt washed arms and armpits)       Upper Body Dressing : Sitting;Moderate assistance (assisted with gown)       Toilet Transfer: Moderate assistance;+2 for physical assistance;Stand-pivot;BSC   Toileting- Clothing Manipulation and Hygiene: Total assistance;Sit to/from stand;+2 for physical assistance       Functional mobility during ADLs: Moderate assistance;+2 for physical assistance General ADL Comments: Encouraged pt to be trying to use RUE. Educated on UB dressing technique.  Talked about having RUE supported with pillow.      Vision                     Perception     Praxis      Cognition  Awake/Alert Behavior During Therapy: WFL for tasks assessed/performed;Flat affect Overall Cognitive Status: Within Functional Limits for tasks assessed                       Extremity/Trunk Assessment               Exercises     Shoulder Instructions       General Comments      Pertinent Vitals/ Pain       Pain Assessment: No/denies pain; HR up to 120s in session but trended down.  Home Living  Prior Functioning/Environment              Frequency Min 3X/week     Progress Toward Goals  OT Goals(current goals can now be found in the care plan section)  Progress towards OT goals: Progressing toward goals-added a goal  Acute Rehab OT Goals Patient Stated Goal: not stated OT Goal Formulation: With patient/family Time For Goal Achievement: 06/23/15 Potential to Achieve Goals: Good ADL Goals Pt Will Perform Eating: with min assist;sitting;with adaptive utensils Pt Will Perform Grooming: with min assist;sitting;with adaptive equipment Pt Will Perform Upper Body Bathing: with mod assist;sitting Pt Will Perform Lower Body Bathing: with mod assist;sitting/lateral leans Pt Will Perform Upper Body Dressing:  sitting;with min assist Pt Will Transfer to Toilet: with mod assist;stand pivot transfer;bedside commode Pt/caregiver will Perform Home Exercise Program: Increased strength;Right Upper extremity;With minimal assist;With written HEP provided  Plan Discharge plan remains appropriate    Co-evaluation                 End of Session Equipment Utilized During Treatment: Other (comment) (sling)   Activity Tolerance Other (comment) (elevated HR but tolerated session well)   Patient Left in chair;with call bell/phone within reach   Nurse Communication Other (comment) (talked to her about sling; abdominal binder?; HR)        Time: [id number] OT Time Calculation (min): 23 min (nurse spent some time with pt)  Charges: OT General Charges $OT Visit: 1 Procedure OT Treatments $Self Care/Home Management : 8-22 mins   Benito Mccreedy OTR/L [id number] 06/16/2015, 1:09 PM

## 2015-06-16 NOTE — Progress Notes (Signed)
Inpatient Rehabilitation  I have received insurance approval from [facility] for IP Rehab.  I have spoken with Dr. Hartford Poli who has given medical clearance.  I have updated pt's Rn as well as [employer], RNCM and Crawford Givens, CSW of the plan.  Pt. And husband are delighted and agreeable.  I will make all necessary arrangements for admission later today.  Please call if questions.  Gerlean Ren PT Inpatient Rehab Admissions Coordinator Cell [phone] Office [phone]  '

## 2015-06-16 NOTE — H&P (View-Only) (Signed)
Physical Medicine and Rehabilitation Admission H&P    Chief Complaint  Patient presents with  . Code Stroke  : HPI: [name] [name] is a 60 y.o.right handed female with history of Sjogrens disease/rheumatoid arthritis maintained on plaquenil and Prednisone, noted history of seizure but not on antiepileptic meds and dysphagia. Patient diagnosed 4 days prior withFlu and placed on Tamiflu. Lives with spouse independent prior to admission. 2 level home with bedroom on main level. 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 Presented 05/29/2015 with right side weakness and gait instability. Patient with severe restrictive airway distress when he presented to the ED with oxygen saturations in the 70s requiring nonrebreathing mask and later intubated with slow extubation follow-up per critical care pulmonary services. Chest x-ray showed large right lung consolidative changes. CT angiogram chest no evidence of pulmonary emboli. Cranial CT scan negative. 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 Echocardiogram with ejection fraction of 65% no wall motion abnormalities. 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 EEG was negative for seizure. Patient did not receive TPA. Blood cultures showed group A strep pansensitive 05/30/2015 and currently maintained on Rocephin with droplet contact precautions with question duration of antibiotics.  Hospital course atrial flutter currently maintained Lovenox 40 mg daily and asymptomatic with low dose beta blocker added. Modified barium swallow 06/09/2015 shows severe pharyngeal dysphagia as well as esophageal stricture. Patient underwent gastrostomy tube per interventional radiology 06/14/2015. Currently maintained on Lovenox for DVT prophylaxis. Plan is to begin aspirin/ Plavix therapy for CVA prophylaxis after PEG tube feeds initiated. Plan for EGD with endoscopy in 6-8 weeks as outpatient for  dilatation of stricture. Physical therapy evaluation completed 06/09/2015 with recommendations of physical medicine rehabilitation consult. Patient was admitted for a comprehensive rehabilitation program  ROS Constitutional: Negative for fever.  HENT: Negative for hearing loss.  Eyes: Negative for blurred vision and double vision.  Respiratory: Positive for cough and shortness of breath.  Cardiovascular: Negative for chest pain.  Gastrointestinal: Positive for constipation. Negative for nausea and vomiting.  Genitourinary: Negative for dysuria and hematuria.  Musculoskeletal: Positive for myalgias and joint pain.  Skin: Positive for rash.  Neurological: Positive for weakness. Negative for loss of consciousness and headaches.  All other systems reviewed and are negative   Past Medical History  Diagnosis Date  . Hypothyroidism   . Seizures ([place])   . Adenopathy   . Scoliosis   . Spleen anomaly   . Anemia   . Sjogren's disease ([place])   . Raynaud disease   . Shingles   . Osteopenia    Past Surgical History  Procedure Laterality Date  . Tonsillectomy    . Breast cyst aspiration Right 1990's    neg   Family History  Problem Relation Age of Onset  . Breast cancer Maternal Grandfather 83   Social History:  reports that she has never smoked. She has never used smokeless tobacco. She reports that she does not drink alcohol or use illicit drugs. Allergies:  Allergies  Allergen Reactions  . Erythromycin Ethylsuccinate Hives  . Keflex [Cephalexin] Hives  . Penicillins   . Sulfa Antibiotics    Medications Prior to Admission  Medication Sig Dispense Refill  . cetirizine (ZYRTEC) 10 MG tablet Take 10 mg by mouth daily.    Marland Kitchen conjugated estrogens (PREMARIN) vaginal cream Place 1 Applicatorful vaginally 2 (two) times a week.    . fluticasone (FLONASE) 50  MCG/ACT nasal spray Place 2 sprays into both nostrils daily.    . [EXPIRED] HYDROcodone-homatropine (HYCODAN) 5-1.5 MG/5ML syrup  Take 1 mL by mouth every 6 (six) hours as needed.    . hydroxychloroquine (PLAQUENIL) 200 MG tablet Take by mouth daily.    Marland Kitchen levothyroxine (SYNTHROID, LEVOTHROID) 112 MCG tablet Take 112 mcg by mouth daily before breakfast.    . Multiple Vitamin (MULTIVITAMIN) tablet Take 1 tablet by mouth daily.    . [EXPIRED] ondansetron (ZOFRAN-ODT) 4 MG disintegrating tablet Take 1 tablet by mouth every 8 (eight) hours as needed.    . [EXPIRED] oseltamivir (TAMIFLU) 75 MG capsule Take 1 capsule by mouth 2 (two) times daily.    . predniSONE (DELTASONE) 10 MG tablet Take 10 mg by mouth daily with breakfast.    . predniSONE (DELTASONE) 20 MG tablet Take 1 tablet by mouth daily.    . tri-vitamin w/ fluoride (TRI-VI-SOL) 0.25 MG/ML solution Take 0.25 mg by mouth daily.    . vitamin B-12 (CYANOCOBALAMIN) 1000 MCG tablet Take 1,000 mcg by mouth daily.      Home: Home Living Family/patient expects to be discharged to:: Private residence Living Arrangements: Spouse/significant other Available Help at Discharge: Family Type of Home: House Home Access: Stairs to enter Technical brewer of Steps: 3 Home Layout: Two level, Able to live on main level with bedroom/bathroom, Full bath on main level Bathroom Shower/Tub: Gaffer, Chiropodist: Standard Home Equipment: Civil engineer, contracting, Civil engineer, contracting - built in Additional Comments: spouse works as a Research scientist (physical sciences) and patient is a Oncologist. husband visits [place] to see patients frequently   Functional History: Prior Function Level of Independence: Independent  Functional Status:  Mobility: Bed Mobility Overal bed mobility: Needs Assistance Bed Mobility: Supine to Sit Supine to sit: Mod assist Sit to supine: Max assist General bed mobility comments: Assist with elevating trunk and to scoot bottom to EOB. Able to use UE to move RLE to EOB. Increased time. Transfers Overall transfer level: Needs assistance Equipment used: 2 person  hand held assist Transfers: Sit to/from Stand, Stand Pivot Transfers Sit to Stand: Mod assist Stand pivot transfers: Mod assist, +2 physical assistance General transfer comment: Stood from EOB x1, from chair x1 with cues for anterior translation; manual cues for upright posture and hip/trunk extension. Able to initiate upright with verbal cues today but fatigues quickly. SPT bed to chair with MOd A of 2. Increased RR and Sp02 dropped to high 80s on RA.      ADL: ADL Overall ADL's : Needs assistance/impaired Eating/Feeding: NPO Grooming: Wash/dry face, Minimal assistance, Bed level (L UE) Lower Body Dressing: Maximal assistance, Bed level Lower Body Dressing Details (indicate cue type and reason): OT don sock over toes and pt able with (A) to pull sock over bil feet General ADL Comments: Pt tolerated EOB sitting for ~ 5 minutes. pt attempting to move R Ue and problem solve balance at EOB. pt encouraged to keep attempting to move R UE. pt with incr effort and attempts demonstrates movement. PT fixated on "dry" sensation from NPO status and pending MBS  Cognition: Cognition Overall Cognitive Status: Within Functional Limits for tasks assessed Orientation Level: Oriented to person, Oriented to place, Disoriented to time, Disoriented to situation Cognition Arousal/Alertness: Awake/alert Behavior During Therapy: Flat affect Overall Cognitive Status: Within Functional Limits for tasks assessed Area of Impairment: Awareness Orientation Level: Disoriented to, Place, Time, Situation Awareness: Intellectual General Comments: Fixated on "dry sensation in mouth.  Physical Exam: Blood  pressure 113/62, pulse 95, temperature 97.8 F (36.6 C), temperature source Oral, resp. rate 19, height 5' 2"  (1.575 m), weight 61.3 kg (135 lb 2.3 oz), SpO2 94 %. Physical Exam Constitutional: She is oriented to person, place, and time. She appears well-developed and well-nourished.  HENT:  Head: Normocephalic.  Atraumatic Eyes: EOM and Conj are normal.  Neck: Normal range of motion. Neck supple. No thyromegaly present.  Cardiovascular: Regular rate and rhythm.  Respiratory: Effort normal and breath sounds normal.  GI: Soft. Bowel sounds are normal. She exhibits no distension.  Neurological: She is oriented to person, place, and time.  Alert .Follows simple commands. Answers biographical questions.  Motor: Right shoulder 2+/5, distally 0/5 Right hip flexion 4-/5, knee extension 4/5, ankle dorsi/plantar flexion 4+/5 LUE/LLE: 5/5 DTRs 3+ RUE Sensation diminished to light touch LUE  Skin: Skin is warm and dry.  Psychiatric:  Affect flat but is cooperative   Results for orders placed or performed during the hospital encounter of 05/29/15 (from the past 48 hour(s))  Glucose, capillary     Status: Abnormal   Collection Time: 06/08/15  3:31 PM  Result Value Ref Range   Glucose-Capillary 103 (H) 65 - 99 mg/dL  Glucose, capillary     Status: Abnormal   Collection Time: 06/08/15  8:15 PM  Result Value Ref Range   Glucose-Capillary 126 (H) 65 - 99 mg/dL  Glucose, capillary     Status: Abnormal   Collection Time: 06/08/15 11:54 PM  Result Value Ref Range   Glucose-Capillary 129 (H) 65 - 99 mg/dL  Heparin level (unfractionated)     Status: None   Collection Time: 06/09/15  2:45 AM  Result Value Ref Range   Heparin Unfractionated 0.37 0.30 - 0.70 IU/mL    Comment:        IF HEPARIN RESULTS ARE BELOW EXPECTED VALUES, AND PATIENT DOSAGE HAS BEEN CONFIRMED, SUGGEST FOLLOW UP TESTING OF ANTITHROMBIN III LEVELS.   Glucose, capillary     Status: Abnormal   Collection Time: 06/09/15  3:38 AM  Result Value Ref Range   Glucose-Capillary 136 (H) 65 - 99 mg/dL  CBC     Status: Abnormal   Collection Time: 06/09/15  3:40 AM  Result Value Ref Range   WBC 24.0 (H) 4.0 - 10.5 K/uL   RBC 3.42 (L) 3.87 - 5.11 MIL/uL   Hemoglobin 9.5 (L) 12.0 - 15.0 g/dL   HCT 32.0 (L) 36.0 - 46.0 %   MCV 93.6 78.0 -  100.0 fL   MCH 27.8 26.0 - 34.0 pg   MCHC 29.7 (L) 30.0 - 36.0 g/dL   RDW 20.2 (H) 11.5 - 15.5 %   Platelets 479 (H) 150 - 400 K/uL  Basic metabolic panel     Status: Abnormal   Collection Time: 06/09/15  3:40 AM  Result Value Ref Range   Sodium 137 135 - 145 mmol/L   Potassium 4.3 3.5 - 5.1 mmol/L   Chloride 100 (L) 101 - 111 mmol/L   CO2 25 22 - 32 mmol/L   Glucose, Bld 144 (H) 65 - 99 mg/dL   BUN 30 (H) 6 - 20 mg/dL   Creatinine, Ser 0.83 0.44 - 1.00 mg/dL   Calcium 8.5 (L) 8.9 - 10.3 mg/dL   GFR calc non Af Amer >60 >60 mL/min   GFR calc Af Amer >60 >60 mL/min    Comment: (NOTE) The eGFR has been calculated using the CKD EPI equation. This calculation has not been validated in all  clinical situations. eGFR's persistently <60 mL/min signify possible Chronic Kidney Disease.    Anion gap 12 5 - 15  Hepatic function panel     Status: Abnormal   Collection Time: 06/09/15  3:40 AM  Result Value Ref Range   Total Protein 6.6 6.5 - 8.1 g/dL   Albumin 1.7 (L) 3.5 - 5.0 g/dL   AST 102 (H) 15 - 41 U/L   ALT 76 (H) 14 - 54 U/L   Alkaline Phosphatase 563 (H) 38 - 126 U/L   Total Bilirubin 0.4 0.3 - 1.2 mg/dL   Bilirubin, Direct 0.1 0.1 - 0.5 mg/dL   Indirect Bilirubin 0.3 0.3 - 0.9 mg/dL  Glucose, capillary     Status: Abnormal   Collection Time: 06/09/15  8:11 AM  Result Value Ref Range   Glucose-Capillary 156 (H) 65 - 99 mg/dL  Glucose, capillary     Status: Abnormal   Collection Time: 06/09/15 12:03 PM  Result Value Ref Range   Glucose-Capillary 109 (H) 65 - 99 mg/dL  Glucose, capillary     Status: Abnormal   Collection Time: 06/09/15  3:38 PM  Result Value Ref Range   Glucose-Capillary 149 (H) 65 - 99 mg/dL  Glucose, capillary     Status: None   Collection Time: 06/09/15  8:12 PM  Result Value Ref Range   Glucose-Capillary 76 65 - 99 mg/dL  Glucose, capillary     Status: Abnormal   Collection Time: 06/09/15 11:56 PM  Result Value Ref Range   Glucose-Capillary 120 (H)  65 - 99 mg/dL  Glucose, capillary     Status: Abnormal   Collection Time: 06/10/15  3:38 AM  Result Value Ref Range   Glucose-Capillary 127 (H) 65 - 99 mg/dL  CBC     Status: Abnormal   Collection Time: 06/10/15  4:00 AM  Result Value Ref Range   WBC 20.6 (H) 4.0 - 10.5 K/uL   RBC 3.26 (L) 3.87 - 5.11 MIL/uL   Hemoglobin 9.2 (L) 12.0 - 15.0 g/dL   HCT 30.6 (L) 36.0 - 46.0 %   MCV 93.9 78.0 - 100.0 fL   MCH 28.2 26.0 - 34.0 pg   MCHC 30.1 30.0 - 36.0 g/dL   RDW 20.6 (H) 11.5 - 15.5 %   Platelets 578 (H) 150 - 400 K/uL  Heparin level (unfractionated)     Status: Abnormal   Collection Time: 06/10/15  4:00 AM  Result Value Ref Range   Heparin Unfractionated 0.11 (L) 0.30 - 0.70 IU/mL    Comment:        IF HEPARIN RESULTS ARE BELOW EXPECTED VALUES, AND PATIENT DOSAGE HAS BEEN CONFIRMED, SUGGEST FOLLOW UP TESTING OF ANTITHROMBIN III LEVELS.   Comprehensive metabolic panel     Status: Abnormal   Collection Time: 06/10/15  4:00 AM  Result Value Ref Range   Sodium 136 135 - 145 mmol/L   Potassium 4.4 3.5 - 5.1 mmol/L   Chloride 104 101 - 111 mmol/L   CO2 23 22 - 32 mmol/L   Glucose, Bld 138 (H) 65 - 99 mg/dL   BUN 30 (H) 6 - 20 mg/dL   Creatinine, Ser 0.76 0.44 - 1.00 mg/dL   Calcium 8.3 (L) 8.9 - 10.3 mg/dL   Total Protein 6.3 (L) 6.5 - 8.1 g/dL   Albumin 1.7 (L) 3.5 - 5.0 g/dL   AST 85 (H) 15 - 41 U/L   ALT 65 (H) 14 - 54 U/L   Alkaline Phosphatase 515 (H)  38 - 126 U/L   Total Bilirubin 0.2 (L) 0.3 - 1.2 mg/dL   GFR calc non Af Amer >60 >60 mL/min   GFR calc Af Amer >60 >60 mL/min    Comment: (NOTE) The eGFR has been calculated using the CKD EPI equation. This calculation has not been validated in all clinical situations. eGFR's persistently <60 mL/min signify possible Chronic Kidney Disease.    Anion gap 9 5 - 15  Glucose, capillary     Status: Abnormal   Collection Time: 06/10/15  8:51 AM  Result Value Ref Range   Glucose-Capillary 144 (H) 65 - 99 mg/dL  Glucose,  capillary     Status: Abnormal   Collection Time: 06/10/15 11:47 AM  Result Value Ref Range   Glucose-Capillary 149 (H) 65 - 99 mg/dL  Heparin level (unfractionated)     Status: None   Collection Time: 06/10/15 11:59 AM  Result Value Ref Range   Heparin Unfractionated 0.46 0.30 - 0.70 IU/mL    Comment:        IF HEPARIN RESULTS ARE BELOW EXPECTED VALUES, AND PATIENT DOSAGE HAS BEEN CONFIRMED, SUGGEST FOLLOW UP TESTING OF ANTITHROMBIN III LEVELS.    Dg Chest Port 1 View  06/10/2015  CLINICAL DATA:  Respiratory failure and short of breath EXAM: PORTABLE CHEST 1 VIEW COMPARISON:  06/08/2015 FINDINGS: Extensive infiltrate in the right lung shows progression. No effusion on the right. Mild left lower lobe infiltrate shows mild progression. No effusion on the left. Feeding tube enters the stomach with the tip not visualized. IMPRESSION: Progression of bilateral infiltrate right greater than left consistent with pneumonia. Electronically Signed   By: Franchot Gallo M.D.   On: 06/10/2015 07:13   Dg Swallowing Func-speech Pathology  06/09/2015  Objective Swallowing Evaluation: Type of Study: MBS-Modified Barium Swallow Study Patient Details Name: Opal Dinning MRN: [id number] Date of Birth: [birth year] Today's Date: 06/09/2015 Time: SLP Start Time (ACUTE ONLY): 1010-SLP Stop Time (ACUTE ONLY): 1035 SLP Time Calculation (min) (ACUTE ONLY): 25 min Past Medical History: Past Medical History Diagnosis Date . Hypothyroidism  . Seizures ([place])  . Adenopathy  . Scoliosis  . Spleen anomaly  . Anemia  . Sjogren's disease ([place])  . Raynaud disease  . Shingles  . Osteopenia  Past Surgical History: Past Surgical History Procedure Laterality Date . Tonsillectomy   . Breast cyst aspiration Right 1990's   neg HPI: Ms/ Pester is a 60 y/o woman with a history of Sjogren's disease, hypothyroidism and dysphagia. She was diagnosed with the flu (A) about 4 days PTA and started on tamiflu. 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  Intubated from 2/12 to 2/21.There is a question of esophageal stricture; pt was seen by GI at [facility], complained of globus with pills and some solids, occuring 2-3x a week. Was scheduled for an EGD at [facility], but appears to have cancelled the procedure. 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 No Data Recorded Assessment / Plan / Recommendation CHL IP CLINICAL IMPRESSIONS 06/09/2015 Therapy Diagnosis Moderate cervical esophageal phase dysphagia;Severe pharyngeal phase dysphagia Clinical Impression Pt presents with a severe multifactoral dysphagia with sensory and motor deficits resulting in gross silent aspiration of thin liquids and penetration of nectar. Swallow is delayed, and laryngeal closure is incomplete, likely a result of prolonged intubation. More significant is noteably decreased opening of UES resulting is severe residuals with puree  and thickened liquids. There is decreased hyolaryngeal excursion and poor pharyngeal constriction for bolus propulsion. 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 This type of deficit is often seen with Sjogren's disease and would could have played a role in occult aspiration contributing to pna. Of course, pt is severely deconditioned at this time, has a new CVA, has an NG tube impeding movement of structures and was intubated for 9 days, all of which play a role in pts dysphagia. Recommend pt remain NPO with short term alternate means of nutrition with close monitoring for progress. Will need repeat MBS to determine readiness for PO.  Impact on safety and function Severe aspiration risk   CHL IP TREATMENT RECOMMENDATION 06/09/2015 Treatment Recommendations F/U MBS in --- days  (Comment)   Prognosis 06/09/2015 Prognosis for Safe Diet Advancement Fair Barriers to Reach Goals Severity of deficits Barriers/Prognosis Comment -- CHL IP DIET RECOMMENDATION 06/09/2015 SLP Diet Recommendations NPO;Alternative means - temporary Liquid Administration via -- Medication Administration Via alternative means Compensations -- Postural Changes --   CHL IP OTHER RECOMMENDATIONS 06/09/2015 Recommended Consults -- Oral Care Recommendations Oral care QID Other Recommendations --   CHL IP FOLLOW UP RECOMMENDATIONS 06/09/2015 Follow up Recommendations Inpatient Rehab   CHL IP FREQUENCY AND DURATION 06/09/2015 Speech Therapy Frequency (ACUTE ONLY) min 3x week Treatment Duration 2 weeks      CHL IP ORAL PHASE 06/09/2015 Oral Phase WFL Oral - Pudding Teaspoon -- Oral - Pudding Cup -- Oral - Honey Teaspoon -- Oral - Honey Cup -- Oral - Nectar Teaspoon -- Oral - Nectar Cup -- Oral - Nectar Straw -- Oral - Thin Teaspoon -- Oral - Thin Cup -- Oral - Thin Straw -- Oral - Puree -- Oral - Mech Soft -- Oral - Regular -- Oral - Multi-Consistency -- Oral - Pill -- Oral Phase - Comment --  CHL IP PHARYNGEAL PHASE 06/09/2015 Pharyngeal Phase Impaired Pharyngeal- Pudding Teaspoon -- Pharyngeal -- Pharyngeal- Pudding Cup -- Pharyngeal -- Pharyngeal- Honey Teaspoon Delayed swallow initiation-vallecula;Reduced epiglottic inversion;Reduced pharyngeal peristalsis;Reduced anterior laryngeal mobility;Reduced airway/laryngeal closure;Pharyngeal residue - valleculae;Pharyngeal residue - pyriform Pharyngeal -- Pharyngeal- Honey Cup -- Pharyngeal -- Pharyngeal- Nectar Teaspoon Delayed swallow initiation-vallecula;Reduced epiglottic inversion;Reduced pharyngeal peristalsis;Reduced anterior laryngeal mobility;Reduced airway/laryngeal closure;Pharyngeal residue - valleculae;Pharyngeal residue - pyriform;Penetration/Apiration after swallow;Penetration/Aspiration during swallow;Penetration/Aspiration before swallow Pharyngeal Material enters airway,  passes BELOW cords without attempt by patient to eject out (silent aspiration);Material enters airway, CONTACTS cords and not ejected out Pharyngeal- Nectar Cup -- Pharyngeal -- Pharyngeal- Nectar Straw -- Pharyngeal -- Pharyngeal- Thin Teaspoon -- Pharyngeal -- Pharyngeal- Thin Cup Delayed swallow initiation-vallecula;Reduced epiglottic inversion;Reduced pharyngeal peristalsis;Reduced anterior laryngeal mobility;Reduced airway/laryngeal closure;Pharyngeal residue - valleculae;Pharyngeal residue - pyriform;Penetration/Aspiration before swallow;Penetration/Aspiration during swallow;Penetration/Apiration after swallow;Significant aspiration (Amount) Pharyngeal Material enters airway, passes BELOW cords without attempt by patient to eject out (silent aspiration) Pharyngeal- Thin Straw -- Pharyngeal -- Pharyngeal- Puree Delayed swallow initiation-vallecula;Reduced epiglottic inversion;Reduced pharyngeal peristalsis;Reduced anterior laryngeal mobility;Reduced airway/laryngeal closure;Pharyngeal residue - valleculae;Pharyngeal residue - pyriform Pharyngeal -- Pharyngeal- Mechanical Soft -- Pharyngeal -- Pharyngeal- Regular -- Pharyngeal -- Pharyngeal- Multi-consistency -- Pharyngeal -- Pharyngeal- Pill -- Pharyngeal -- Pharyngeal Comment --  CHL IP CERVICAL ESOPHAGEAL PHASE 06/09/2015 Cervical Esophageal Phase Impaired Pudding Teaspoon -- Pudding Cup -- Honey Teaspoon -- Honey Cup -- Nectar Teaspoon -- Nectar Cup -- Nectar Straw -- Thin Teaspoon -- Thin Cup -- Thin Straw --  Puree -- Mechanical Soft -- Regular -- Multi-consistency -- Pill -- Cervical Esophageal Comment see impression No flowsheet data found. DeBlois, Katherene Ponto 06/09/2015, 11:25 AM              US Abdomen Limited Ruq  06/09/2015  CLINICAL DATA:  Elevated LFTs EXAM: US ABDOMEN LIMITED - RIGHT UPPER QUADRANT COMPARISON:  None. FINDINGS: Gallbladder: Gallstones are identified. No wall thickening or pericholecystic fluid is noted. Negative sonographic  Percell Miller sign is noted. Common bile duct: Diameter: 1.9 mm. Liver: No focal lesion identified. Within normal limits in parenchymal echogenicity. Incidental note is made of a small right-sided pleural effusion. IMPRESSION: Cholelithiasis. Right pleural effusion. Electronically Signed   By: Inez Catalina M.D.   On: 06/09/2015 21:50    Medical Problem List and Plan: 1.  Right side weakness/dysphagia secondary to left frontal lobe infarct complicated by respiratory failure. 2.  DVT Prophylaxis/Anticoagulation: SCDs. Monitor for any signs of DVT 3. Pain Management: Tylenol as needed 4. Dysphagia/esophageal stricture. Status post gastrostomy tube 06/14/2015 per interventional radiology. Speech therapy follow-up. Plan outpatient EGD endoscopy for dilatation of stricture 5. Neuropsych: This patient is capable of making decisions on her own behalf. 6. Skin/Wound Care: Routine skin checks 7. Fluids/Electrolytes/Nutrition: Routine I&O's with follow-up chemistries 8. ID. Strep pyogenes bacteremia. Followed by infectious disease. Continue ceftriaxone questions 6 week course and confirm with Dr. Drucilla Schmidt. Maintain contact precautions 9. Atrial flutter with RVR. Lopressor 12.5 mg twice a day 10. Rheumatoid arthritis/Sjorgens disease. Discuss resuming plaquenil and chronic prednisone. 11. Hyperglycemia secondary to chronic prednisone/tube feeds. Hemoglobin A1c 6.6. Check blood sugars while on tube feeds 12. Hyperlipidemia. Lipitor 13. Hypothyroidism. Resume Synthroid  Post Admission Physician Evaluation: Functional deficits secondary  to left frontal lobe infarct complicated by respiratory failure. 1. Patient is admitted to receive collaborative, interdisciplinary care between the physiatrist, rehab nursing staff, and therapy team. 2. Patient's level of medical complexity and substantial therapy needs in context of that medical necessity cannot be provided at a lesser intensity of care such as a SNF. 3. Patient has  experienced substantial functional loss from his/her baseline which was documented above under the "Functional History" and "Functional Status" headings.  Judging by the patient's diagnosis, physical exam, and functional history, the patient has potential for functional progress which will result in measurable gains while on inpatient rehab.  These gains will be of substantial and practical use upon discharge  in facilitating mobility and self-care at the household level. 4. Physiatrist will provide 24 hour management of medical needs as well as oversight of the therapy plan/treatment and provide guidance as appropriate regarding the interaction of the two. 5. 24 hour rehab nursing will assist with safety, disease management, medication administration, and patient education and help integrate therapy concepts, techniques,education, etc. 6. PT will assess and treat for/with: Lower extremity strength, range of motion, stamina, balance, functional mobility, safety, adaptive techniques and equipment, coping skills, pain control, education.   Goals are: MinA/Supervision. 7. OT will assess and treat for/with: ADL's, functional mobility, safety, upper extremity strength, adaptive techniques and equipment, ego support, and community reintegration.   Goals are: MinA/Supervision. Therapy may proceed with showering this patient. 8. Case Management and Social Worker will assess and treat for psychological issues and discharge planning. 9. Team conference will be held weekly to assess progress toward goals and to determine barriers to discharge. 10. Patient will receive at least 3 hours of therapy per day at least 5 days per week. 11. ELOS: 19-22 days.  12. Prognosis:  good   Delice Lesch, MD 06/10/2015

## 2015-06-16 NOTE — Progress Notes (Signed)
Physical Medicine and Rehabilitation Consult Reason for Consult: Acute left frontal lobe infarct/Acute respiratory failure  Referring Physician: [facility]   HPI: [name] [name] is a 60 y.o.right handed female with history of Sjogrens disease/rheumatoid arthritis, noted history of seizure but not on antiepileptic meds and dysphagia. Patient diagnosed 4 days prior with an placed on Tamiflu. Lives with spouse independent prior to admission. 2 level home with bedroom on main level. 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 Presented 05/29/2015 with right side weakness and gait instability. Patient with severe restrictive airway distress when he presented to the ED with oxygen saturations in the 70s requiring nonrebreathing mask and later intubated with slow extubation follow-up per critical care pulmonary services. Chest x-ray showed large right lung consolidative changes. CT angiogram chest no evidence of pulmonary emboli. Cranial CT scan negative. 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 Echocardiogram with ejection fraction of 65% no wall motion abnormalities. 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 EEG was negative for seizure. Patient did not receive TPA. Blood cultures showed group A strep pansensitive 05/30/2015 and currently maintained on Rocephin with droplet contact precautions. Await plan for possible TEE. Hospital course atrial flutter currently maintained on intravenous heparin. Plavix recommended for CVA however on hold as patient may need PEG tube and remains nothing by mouth. Physical therapy evaluation completed 06/09/2015 with recommendations of physical medicine rehabilitation consult.   Review of Systems  Constitutional: Positive for chills. Negative for fever.  HENT: Negative for hearing loss.  Eyes: Negative for blurred vision and double vision.  Respiratory: Positive for cough and shortness of breath.    Cardiovascular: Positive for palpitations and leg swelling. Negative for chest pain.  Gastrointestinal: Positive for constipation. Negative for nausea and vomiting.  Genitourinary: Negative for dysuria and hematuria.  Musculoskeletal: Positive for myalgias and joint pain.  Skin: Positive for rash.  Neurological: Positive for dizziness and weakness. Negative for loss of consciousness and headaches.  All other systems reviewed and are negative.  Past Medical History  Diagnosis Date  . Hypothyroidism   . Seizures ([place])   . Adenopathy   . Scoliosis   . Spleen anomaly   . Anemia   . Sjogren's disease ([place])   . Raynaud disease   . Shingles   . Osteopenia    Past Surgical History  Procedure Laterality Date  . Tonsillectomy    . Breast cyst aspiration Right 1990's    neg   Family History  Problem Relation Age of Onset  . Breast cancer Maternal Grandfather 41   Social History:  reports that she has never smoked. She has never used smokeless tobacco. She reports that she does not drink alcohol or use illicit drugs. Allergies:  Allergies  Allergen Reactions  . Erythromycin Ethylsuccinate Hives  . Keflex [Cephalexin] Hives  . Penicillins   . Sulfa Antibiotics    Medications Prior to Admission  Medication Sig Dispense Refill  . cetirizine (ZYRTEC) 10 MG tablet Take 10 mg by mouth daily.    Marland Kitchen conjugated estrogens (PREMARIN) vaginal cream Place 1 Applicatorful vaginally 2 (two) times a week.    . fluticasone (FLONASE) 50 MCG/ACT nasal spray Place 2 sprays into both nostrils daily.    . [EXPIRED] HYDROcodone-homatropine (HYCODAN) 5-1.5 MG/5ML syrup Take 1 mL by mouth every 6 (six) hours as needed.    . hydroxychloroquine (PLAQUENIL) 200 MG tablet Take by mouth daily.    Marland Kitchen levothyroxine (  SYNTHROID, LEVOTHROID) 112 MCG tablet Take 112 mcg by mouth daily before breakfast.    .  Multiple Vitamin (MULTIVITAMIN) tablet Take 1 tablet by mouth daily.    . [EXPIRED] ondansetron (ZOFRAN-ODT) 4 MG disintegrating tablet Take 1 tablet by mouth every 8 (eight) hours as needed.    . [EXPIRED] oseltamivir (TAMIFLU) 75 MG capsule Take 1 capsule by mouth 2 (two) times daily.    . predniSONE (DELTASONE) 10 MG tablet Take 10 mg by mouth daily with breakfast.    . predniSONE (DELTASONE) 20 MG tablet Take 1 tablet by mouth daily.    . tri-vitamin w/ fluoride (TRI-VI-SOL) 0.25 MG/ML solution Take 0.25 mg by mouth daily.    . vitamin B-12 (CYANOCOBALAMIN) 1000 MCG tablet Take 1,000 mcg by mouth daily.      Home: Home Living Family/patient expects to be discharged to:: Private residence Living Arrangements: Spouse/significant other Available Help at Discharge: Family Type of Home: House Home Access: Stairs to enter Technical brewer of Steps: 3 Home Layout: Two level, Able to live on main level with bedroom/bathroom, Full bath on main level Bathroom Shower/Tub: Gaffer, Chiropodist: Standard Home Equipment: Civil engineer, contracting, Civil engineer, contracting - built in Additional Comments: spouse works as a Research scientist (physical sciences) and patient is a Oncologist. husband visits [facility] to see patients frequently  Functional History: Prior Function Level of Independence: Independent Functional Status:  Mobility: Bed Mobility Overal bed mobility: Needs Assistance Bed Mobility: Supine to Sit, Sit to Supine Supine to sit: Mod assist Sit to supine: Max assist General bed mobility comments: Sitting in chair upon PT arrival.  Transfers Overall transfer level: Needs assistance Equipment used: 1 person hand held assist Transfers: Sit to/from Stand Sit to Stand: Mod assist, +2 physical assistance General transfer comment: Stood from chair with Mod A of 2; manual cues for hip/trunk extension. Able to initiate back extensors on command but fatigues quickly.  HR up to 135 in standind and Sp02 down to 87%. increased RR. Stood Gaffer.      ADL: ADL Overall ADL's : Needs assistance/impaired Eating/Feeding: NPO Grooming: Wash/dry face, Minimal assistance, Bed level (L UE) Lower Body Dressing: Maximal assistance, Bed level Lower Body Dressing Details (indicate cue type and reason): OT don sock over toes and pt able with (A) to pull sock over bil feet General ADL Comments: Pt tolerated EOB sitting for ~ 5 minutes. pt attempting to move R Ue and problem solve balance at EOB. pt encouraged to keep attempting to move R UE. pt with incr effort and attempts demonstrates movement. PT fixated on "dry" sensation from NPO status and pending MBS  Cognition: Cognition Overall Cognitive Status: Impaired/Different from baseline Orientation Level: Oriented to person, Oriented to place, Disoriented to time, Disoriented to situation Cognition Arousal/Alertness: Awake/alert Behavior During Therapy: Flat affect Overall Cognitive Status: Impaired/Different from baseline Area of Impairment: Awareness Orientation Level: Disoriented to, Place, Time, Situation Awareness: Intellectual General Comments: Fixated on "dry sensation in mouth.  Blood pressure 127/66, pulse 87, temperature 98.1 F (36.7 C), temperature source Oral, resp. rate 24, height 5\' 2"  (1.575 m), weight 61.3 kg (135 lb 2.3 oz), SpO2 93 %. Physical Exam  Constitutional: She is oriented to person, place, and time. She appears well-developed.  HENT:  Head: Normocephalic.  NG tube in place.  Eyes: EOM are normal.  Neck: Normal range of motion. Neck supple. No thyromegaly present.  Cardiovascular:  Cardiac rate controlled  Respiratory: Effort normal and breath sounds normal.  GI: Soft. Bowel sounds  are normal. She exhibits no distension.  Neurological: She is oriented to person, place, and time.  Alert .Follows simple commands. Answers biographical questions. Right inattention. MMT: 1-2 (inconsistent)  RUE and tr to 1 RLE. Moves left side fairly freely. Senses touch in both right and left limbs.  Skin: Skin is warm and dry.  Psychiatric:  Affect flat but is cooperative     Lab Results Last 24 Hours    Results for orders placed or performed during the hospital encounter of 05/29/15 (from the past 24 hour(s))  Glucose, capillary Status: Abnormal   Collection Time: 06/09/15 8:11 AM  Result Value Ref Range   Glucose-Capillary 156 (H) 65 - 99 mg/dL  Glucose, capillary Status: Abnormal   Collection Time: 06/09/15 12:03 PM  Result Value Ref Range   Glucose-Capillary 109 (H) 65 - 99 mg/dL  Glucose, capillary Status: Abnormal   Collection Time: 06/09/15 3:38 PM  Result Value Ref Range   Glucose-Capillary 149 (H) 65 - 99 mg/dL  Glucose, capillary Status: None   Collection Time: 06/09/15 8:12 PM  Result Value Ref Range   Glucose-Capillary 76 65 - 99 mg/dL  Glucose, capillary Status: Abnormal   Collection Time: 06/09/15 11:56 PM  Result Value Ref Range   Glucose-Capillary 120 (H) 65 - 99 mg/dL  Glucose, capillary Status: Abnormal   Collection Time: 06/10/15 3:38 AM  Result Value Ref Range   Glucose-Capillary 127 (H) 65 - 99 mg/dL  CBC Status: Abnormal   Collection Time: 06/10/15 4:00 AM  Result Value Ref Range   WBC 20.6 (H) 4.0 - 10.5 K/uL   RBC 3.26 (L) 3.87 - 5.11 MIL/uL   Hemoglobin 9.2 (L) 12.0 - 15.0 g/dL   HCT 30.6 (L) 36.0 - 46.0 %   MCV 93.9 78.0 - 100.0 fL   MCH 28.2 26.0 - 34.0 pg   MCHC 30.1 30.0 - 36.0 g/dL   RDW 20.6 (H) 11.5 - 15.5 %   Platelets 578 (H) 150 - 400 K/uL  Heparin level (unfractionated) Status: Abnormal   Collection Time: 06/10/15 4:00 AM  Result Value Ref Range   Heparin Unfractionated 0.11 (L) 0.30 - 0.70 IU/mL  Comprehensive metabolic panel Status: Abnormal   Collection Time: 06/10/15 4:00  AM  Result Value Ref Range   Sodium 136 135 - 145 mmol/L   Potassium 4.4 3.5 - 5.1 mmol/L   Chloride 104 101 - 111 mmol/L   CO2 23 22 - 32 mmol/L   Glucose, Bld 138 (H) 65 - 99 mg/dL   BUN 30 (H) 6 - 20 mg/dL   Creatinine, Ser 0.76 0.44 - 1.00 mg/dL   Calcium 8.3 (L) 8.9 - 10.3 mg/dL   Total Protein 6.3 (L) 6.5 - 8.1 g/dL   Albumin 1.7 (L) 3.5 - 5.0 g/dL   AST 85 (H) 15 - 41 U/L   ALT 65 (H) 14 - 54 U/L   Alkaline Phosphatase 515 (H) 38 - 126 U/L   Total Bilirubin 0.2 (L) 0.3 - 1.2 mg/dL   GFR calc non Af Amer >60 >60 mL/min   GFR calc Af Amer >60 >60 mL/min   Anion gap 9 5 - 15      Imaging Results (Last 48 hours)    Dg Chest Port 1 View  06/08/2015 CLINICAL DATA: Respiratory failure EXAM: PORTABLE CHEST 1 VIEW COMPARISON: 06/07/2015 FINDINGS: Cardiac shadow is stable. A feeding catheter is again identified. Patchy infiltrate is noted throughout the right lung stable from the prior exam. Some slight increased  density is now seen in the left base which may represent some early infiltrate as well. No other focal abnormality is noted. IMPRESSION: Stable right sided pneumonia. New left basilar infiltrative density. Electronically Signed By: Inez Catalina M.D. On: 06/08/2015 10:11   Dg Swallowing Func-speech Pathology  06/09/2015 Objective Swallowing Evaluation: Type of Study: MBS-Modified Barium Swallow Study Patient Details Name: Soma Gentle MRN: [id number] Date of Birth: May 01, 1955 Today's Date: 06/09/2015 Time: SLP Start Time (ACUTE ONLY): 1010-SLP Stop Time (ACUTE ONLY): 1035 SLP Time Calculation (min) (ACUTE ONLY): 25 min Past Medical History: Past Medical History Diagnosis Date . Hypothyroidism . Seizures ([place]) . Adenopathy . Scoliosis . Spleen anomaly . Anemia . Sjogren's disease ([place]) . Raynaud disease . Shingles . Osteopenia Past Surgical History: Past Surgical History Procedure Laterality Date .  Tonsillectomy . Breast cyst aspiration Right 1990's neg HPI: Ms/ Yong is a 60 y/o woman with a history of Sjogren's disease, hypothyroidism and dysphagia. She was diagnosed with the flu (A) about 4 days PTA and started on tamiflu. 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 Intubated from 2/12 to 2/21.There is a question of esophageal stricture; pt was seen by GI at [facility], complained of globus with pills and some solids, occuring 2-3x a week. Was scheduled for an EGD at [facility], but appears to have cancelled the procedure. 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 No Data Recorded Assessment / Plan / Recommendation CHL IP CLINICAL IMPRESSIONS 06/09/2015 Therapy Diagnosis Moderate cervical esophageal phase dysphagia;Severe pharyngeal phase dysphagia Clinical Impression Pt presents with a severe multifactoral dysphagia with sensory and motor deficits resulting in gross silent aspiration of thin liquids and penetration of nectar. Swallow is delayed, and laryngeal closure is incomplete, likely a result of prolonged intubation. More significant is noteably decreased opening of UES resulting is severe residuals with puree and thickened liquids. There is decreased hyolaryngeal excursion and poor pharyngeal constriction for bolus propulsion. 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 This type of deficit is often seen with Sjogren's disease and would could have played a role in occult aspiration contributing to pna. Of course, pt is severely deconditioned at this time, has a new CVA, has an NG tube impeding movement of structures and was intubated for 9 days, all of which play a role in pts dysphagia. Recommend pt remain NPO  with short term alternate means of nutrition with close monitoring for progress. Will need repeat MBS to determine readiness for PO. Impact on safety and function Severe aspiration risk CHL IP TREATMENT RECOMMENDATION 06/09/2015 Treatment Recommendations F/U MBS in --- days (Comment) Prognosis 06/09/2015 Prognosis for Safe Diet Advancement Fair Barriers to Reach Goals Severity of deficits Barriers/Prognosis Comment -- CHL IP DIET RECOMMENDATION 06/09/2015 SLP Diet Recommendations NPO;Alternative means - temporary Liquid Administration via -- Medication Administration Via alternative means Compensations -- Postural Changes -- CHL IP OTHER RECOMMENDATIONS 06/09/2015 Recommended Consults -- Oral Care Recommendations Oral care QID Other Recommendations -- CHL IP FOLLOW UP RECOMMENDATIONS 06/09/2015 Follow up Recommendations Inpatient Rehab CHL IP FREQUENCY AND DURATION 06/09/2015 Speech Therapy Frequency (ACUTE ONLY) min 3x week Treatment Duration 2 weeks CHL IP ORAL PHASE 06/09/2015 Oral Phase WFL Oral - Pudding Teaspoon -- Oral - Pudding Cup -- Oral - Honey Teaspoon -- Oral -  Honey Cup -- Oral - Nectar Teaspoon -- Oral - Nectar Cup -- Oral - Nectar Straw -- Oral - Thin Teaspoon -- Oral - Thin Cup -- Oral - Thin Straw -- Oral - Puree -- Oral - Mech Soft -- Oral - Regular -- Oral - Multi-Consistency -- Oral - Pill -- Oral Phase - Comment -- CHL IP PHARYNGEAL PHASE 06/09/2015 Pharyngeal Phase Impaired Pharyngeal- Pudding Teaspoon -- Pharyngeal -- Pharyngeal- Pudding Cup -- Pharyngeal -- Pharyngeal- Honey Teaspoon Delayed swallow initiation-vallecula;Reduced epiglottic inversion;Reduced pharyngeal peristalsis;Reduced anterior laryngeal mobility;Reduced airway/laryngeal closure;Pharyngeal residue - valleculae;Pharyngeal residue - pyriform Pharyngeal -- Pharyngeal- Honey Cup -- Pharyngeal -- Pharyngeal- Nectar Teaspoon Delayed swallow initiation-vallecula;Reduced epiglottic inversion;Reduced pharyngeal  peristalsis;Reduced anterior laryngeal mobility;Reduced airway/laryngeal closure;Pharyngeal residue - valleculae;Pharyngeal residue - pyriform;Penetration/Apiration after swallow;Penetration/Aspiration during swallow;Penetration/Aspiration before swallow Pharyngeal Material enters airway, passes BELOW cords without attempt by patient to eject out (silent aspiration);Material enters airway, CONTACTS cords and not ejected out Pharyngeal- Nectar Cup -- Pharyngeal -- Pharyngeal- Nectar Straw -- Pharyngeal -- Pharyngeal- Thin Teaspoon -- Pharyngeal -- Pharyngeal- Thin Cup Delayed swallow initiation-vallecula;Reduced epiglottic inversion;Reduced pharyngeal peristalsis;Reduced anterior laryngeal mobility;Reduced airway/laryngeal closure;Pharyngeal residue - valleculae;Pharyngeal residue - pyriform;Penetration/Aspiration before swallow;Penetration/Aspiration during swallow;Penetration/Apiration after swallow;Significant aspiration (Amount) Pharyngeal Material enters airway, passes BELOW cords without attempt by patient to eject out (silent aspiration) Pharyngeal- Thin Straw -- Pharyngeal -- Pharyngeal- Puree Delayed swallow initiation-vallecula;Reduced epiglottic inversion;Reduced pharyngeal peristalsis;Reduced anterior laryngeal mobility;Reduced airway/laryngeal closure;Pharyngeal residue - valleculae;Pharyngeal residue - pyriform Pharyngeal -- Pharyngeal- Mechanical Soft -- Pharyngeal -- Pharyngeal- Regular -- Pharyngeal -- Pharyngeal- Multi-consistency -- Pharyngeal -- Pharyngeal- Pill -- Pharyngeal -- Pharyngeal Comment -- CHL IP CERVICAL ESOPHAGEAL PHASE 06/09/2015 Cervical Esophageal Phase Impaired Pudding Teaspoon -- Pudding Cup -- Honey Teaspoon -- Honey Cup -- Nectar Teaspoon -- Nectar Cup -- Nectar Straw -- Thin Teaspoon -- Thin Cup -- Thin Straw -- Puree -- Mechanical Soft -- Regular -- Multi-consistency -- Pill -- Cervical Esophageal Comment see impression No flowsheet data found. DeBlois, Katherene Ponto  06/09/2015, 11:25 AM   US Abdomen Limited Ruq  06/09/2015 CLINICAL DATA: Elevated LFTs EXAM: US ABDOMEN LIMITED - RIGHT UPPER QUADRANT COMPARISON: None. FINDINGS: Gallbladder: Gallstones are identified. No wall thickening or pericholecystic fluid is noted. Negative sonographic Percell Miller sign is noted. Common bile duct: Diameter: 1.9 mm. Liver: No focal lesion identified. Within normal limits in parenchymal echogenicity. Incidental note is made of a small right-sided pleural effusion. IMPRESSION: Cholelithiasis. Right pleural effusion. Electronically Signed By: Inez Catalina M.D. On: 06/09/2015 21:50     Assessment/Plan: Diagnosis: left frontal infarct 1. Does the need for close, 24 hr/day medical supervision in concert with the patient's rehab needs make it unreasonable for this patient to be served in a less intensive setting? Yes 2. Co-Morbidities requiring supervision/potential complications: pneumonia, dysphagia 3. Due to bladder management, bowel management, safety, skin/wound care, disease management, medication administration, pain management and patient education, does the patient require 24 hr/day rehab nursing? Yes 4. Does the patient require coordinated care of a physician, rehab nurse, PT (1-2 hrs/day, 5 days/week), OT (1-2 hrs/day, 5 days/week) and SLP (1-2 hrs/day, 5 days/week) to address physical and functional deficits in the context of the above medical diagnosis(es)? Yes Addressing deficits in the following areas: balance, endurance, locomotion, strength, transferring, bowel/bladder control, bathing, dressing, feeding, grooming, toileting, cognition, swallowing and psychosocial support 5. Can the patient actively participate in an intensive therapy program of at least 3 hrs of therapy per day at least 5 days per week? Yes 6. The potential for patient to make  measurable gains while on inpatient rehab is excellent 7. Anticipated functional outcomes upon discharge from  inpatient rehab are supervision and min assist with PT, supervision and min assist with OT, supervision and min assist with SLP. 8. Estimated rehab length of stay to reach the above functional goals is: 18-27 days 9. Does the patient have adequate social supports and living environment to accommodate these discharge functional goals? Yes 10. Anticipated D/C setting: Home 11. Anticipated post D/C treatments: HH therapy and Outpatient therapy 12. Overall Rehab/Functional Prognosis: excellent  RECOMMENDATIONS: This patient's condition is appropriate for continued rehabilitative care in the following setting: CIR Patient has agreed to participate in recommended program. Yes Note that insurance prior authorization may be required for reimbursement for recommended care.  Comment: Rehab Admissions Coordinator to follow up.  Thanks,  Meredith Staggers, MD, Mellody Drown     06/10/2015       Revision History     Date/Time User Provider Type Action   06/10/2015 8:28 AM Meredith Staggers, MD Physician Sign   06/10/2015 7:12 AM Cathlyn Parsons, PA-C Physician Assistant Pend   View Details Report       Routing History     Date/Time From To Method   06/10/2015 8:28 AM Meredith Staggers, MD Meredith Staggers, MD In Basket

## 2015-06-16 NOTE — Progress Notes (Signed)
PMR Admission Coordinator Pre-Admission Assessment  Patient: [name] [name] is an 60 y.o., female MRN: [id number] DOB: [birth year] Height: 5\' 2"  (157.5 cm) Weight: 55.792 kg (123 lb)  Insurance Information HMO: PPO: PCP: IPA: 80/20: yes OTHER:  PRIMARY: [employer] Policy#: [id number] Subscriber:self CM Name: Levada Dy Phone#: [phone] Fax#: [phone] Pre-Cert#: [id number], approved for 14 days with clinical updates due following conference on 3/151/7 Employer: [employer] Benefits: Phone #: [phone] Name: online Eff. Date: 04/18/15 Deduct: $1250 Out of Pocket Max: $4350 Life Max: n/a CIR: 80%/20% SNF: 80%/20% Outpatient: 80% Co-Pay: 20% Home Health: 80%/ Co-Pay: 20% DME: 80% Co-Pay: 20% Providers: In network SECONDARY: Policy#: Subscriber:  CM Name: Phone#: Fax#:  Pre-Cert#: Employer:  Benefits: Phone #: Name:  Eff. Date: Deduct: Out of Pocket Max: Life Max:  CIR: SNF:  Outpatient: Co-Pay:  Home Health: Co-Pay:  DME: Co-Pay:   Medicaid Application Date: Case Manager:  Disability Application Date: Case Worker:   Emergency Contact Information Contact Information    Name Relation Home Work Mobile   Debow,[name] Spouse [phone]       Current Medical History  Patient Admitting Diagnosis: left frontal infarct History of Present Illness:[name] [name] is a 60 y.o.right handed female with history of Sjogrens disease/rheumatoid arthritis maintained on plaquenil and Prednisone, noted history of seizure but not on antiepileptic meds and dysphagia. Patient diagnosed 4 days prior withFlu and placed on Tamiflu.  Lives with spouse independent prior to admission. 2 level home with bedroom on main level. 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 Presented 05/29/2015 with right side weakness and gait instability. Patient with severe restrictive airway distress when he presented to the ED with oxygen saturations in the 70s requiring nonrebreathing mask and later intubated with slow extubation follow-up per critical care pulmonary services. Chest x-ray showed large right lung consolidative changes. CT angiogram chest no evidence of pulmonary emboli. Cranial CT scan negative. 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 Echocardiogram with ejection fraction of 65% no wall motion abnormalities. 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 EEG was negative for seizure. Patient did not receive TPA. Blood cultures showed group A strep pansensitive 05/30/2015 and currently maintained on Rocephin with droplet contact precautions with question duration of antibiotics. Hospital course atrial flutter currently maintained Lovenox 40 mg daily and asymptomatic with low dose beta blocker added. Modified barium swallow 06/09/2015 shows severe pharyngeal dysphagia as well as esophageal stricture. Patient underwent gastrostomy tube per interventional radiology 06/14/2015. Currently maintained on Lovenox for DVT prophylaxis. Plan is to begin aspirin/ Plavix therapy for CVA prophylaxis after PEG tube feeds initiated. Plan for EGD with endoscopy in 6-8 weeks as outpatient for dilatation of stricture. Physical therapy evaluation completed 06/09/2015 with recommendations of physical medicine rehabilitation consult. Patient was admitted for a comprehensive  Total: 7 NIH    Past Medical History  Past Medical History  Diagnosis Date  . Hypothyroidism   . Seizures ([place])   . Adenopathy   . Scoliosis   . Spleen anomaly   .  Anemia   . Sjogren's disease ([place])   . Raynaud disease   . Shingles   . Osteopenia     Family History  family history includes Breast cancer (age of onset: 53) in her maternal grandfather.  Prior Rehab/Hospitalizations:  Has the patient had major surgery during 100 days prior to admission? No  Current Medications   Current facility-administered medications:  . 0.9 %  sodium chloride infusion, 250 mL, Intravenous, PRN, Guy Begin, MD, Stopped at 06/02/15 1738 . acetaminophen (TYLENOL) solution 650 mg, 650 mg, Per Tube, Q6H PRN, Colbert Coyer, MD, 650 mg at 06/06/15 1344 . antiseptic oral rinse (BIOTENE) solution 15 mL, 15 mL, Mouth Rinse, PRN, Lavina Hamman, MD, 15 mL at 06/14/15 1231 . antiseptic oral rinse solution (CORINZ), 7 mL, Mouth Rinse, QID, Kara Mead V, MD, 7 mL at 06/16/15 1600 . aspirin suppository 300 mg, 300 mg, Rectal, Daily, Reyne Dumas, MD, 300 mg at 06/16/15 1100 . atorvastatin (LIPITOR) tablet 40 mg, 40 mg, Per Tube, q1800, Modena Jansky, MD, 40 mg at 06/16/15 1733 . cefTRIAXone (ROCEPHIN) 2 g in dextrose 5 % 50 mL IVPB, 2 g, Intravenous, Q12H, Raylene Miyamoto, MD, 2 g at 06/16/15 1100 . chlorhexidine gluconate (PERIDEX) 0.12 % solution 15 mL, 15 mL, Mouth/Throat, BID, Lauren D Bajbus, RPH, 15 mL at 06/16/15 1133 . dextrose 5 % solution, , Intravenous, Continuous, Modena Jansky, MD, Last Rate: 50 mL/hr at 06/16/15 1200 . enoxaparin (LOVENOX) injection 40 mg, 40 mg, Subcutaneous, Q24H, Reyne Dumas, MD, 40 mg at 06/16/15 1100 . feeding supplement (JEVITY 1.2 CAL) liquid 1,000 mL, 1,000 mL, Per Tube, Continuous, Modena Jansky, MD, Last Rate: 60 mL/hr at 06/16/15 1200, 1,000 mL at 06/16/15 1200 . free water 100 mL, 100 mL, Per Tube, QID, Modena Jansky, MD, 100 mL at 06/16/15 1800 . Gerhardt's butt cream, , Topical, PRN, Kara Mead V, MD . insulin aspart (novoLOG) injection 0-9 Units, 0-9 Units, Subcutaneous,  6 times per day, Modena Jansky, MD, 2 Units at 06/16/15 1300 . insulin glargine (LANTUS) injection 5 Units, 5 Units, Subcutaneous, Daily, Modena Jansky, MD, 5 Units at 06/16/15 1100 . metoprolol tartrate (LOPRESSOR) 25 mg/10 mL oral suspension 12.5 mg, 12.5 mg, Per Tube, BID, Raylene Miyamoto, MD, 12.5 mg at 06/16/15 1100 . pantoprazole sodium (PROTONIX) 40 mg/20 mL oral suspension 40 mg, 40 mg, Per Tube, Q1200, Kara Mead V, MD, 40 mg at 06/16/15 1140 . potassium chloride 20 MEQ/15ML (10%) solution 40 mEq, 40 mEq, Per Tube, Daily, Modena Jansky, MD, 40 mEq at 06/16/15 1100 . sodium chloride flush (NS) 0.9 % injection 10-40 mL, 10-40 mL, Intracatheter, Q12H, Reyne Dumas, MD, 10 mL at 06/13/15 2200 . sodium chloride flush (NS) 0.9 % injection 10-40 mL, 10-40 mL, Intracatheter, PRN, Reyne Dumas, MD  Patients Current Diet: Diet NPO time specified  Precautions / Restrictions Precautions Precautions: Fall Precaution Comments: R hemiplegia Restrictions Weight Bearing Restrictions: No   Has the patient had 2 or more falls or a fall with injury in the past year?No  Prior Activity Level Community (5-7x/wk): Pt. is a Oncologist at [employer] / [place] Devices/Equipment: None Home Equipment: Shower seat, Shower seat - built in  Prior Device Use: Indicate devices/aids used by the patient prior to current illness, exacerbation or injury? None of the above  Prior Functional Level Prior Function Level of Independence: Independent  Self Care: Did the patient need help bathing, dressing, using the toilet or eating? Independent  Indoor Mobility: Did the patient need assistance with walking from room to room (with or without device)? Independent  Stairs: Did the patient need assistance with internal or external stairs (with or without device)? Independent  Functional Cognition: Did the patient need help planning  regular tasks such as shopping or remembering to take medications? Independent  Current Functional Level  Cognition  Overall Cognitive Status: Within Functional Limits for tasks assessed Orientation Level: Oriented X4 General Comments: Fixated on "dry sensation in mouth.   Extremity Assessment (includes Sensation/Coordination)  Upper Extremity Assessment: Defer to OT evaluation RUE Deficits / Details: demonstrates shoulder extension, shoulder flexion shoulder abduction shoulder adduction, elbow flexion / extension supination . Pt does not actively demonstrate hand or wrist activation at this time. pt educated on using L UE at wrist to(A) R UE.  RUE Coordination: decreased fine motor, decreased gross motor  Lower Extremity Assessment: RLE deficits/detail RLE Deficits / Details: Grossly ~2+/5 hip flexion, 3/5 knee extension/flexion and 2+/5 DF. RLE Sensation: decreased light touch RLE Coordination: decreased fine motor, decreased gross motor    ADLs  Overall ADL's : Needs assistance/impaired Eating/Feeding: NPO Eating/Feeding Details (indicate cue type and reason): nurse assisted pt with oral swab Grooming: Applying deodorant, Sitting, Minimal assistance Grooming Details (indicate cue type and reason): assisted in placing deodorant in pt's left hand and with moving RUE Upper Body Bathing: Sitting, Moderate assistance Upper Body Dressing : Sitting, Moderate assistance Lower Body Dressing: Maximal assistance, Bed level Lower Body Dressing Details (indicate cue type and reason): OT don sock over toes and pt able with (A) to pull sock over bil feet Toilet Transfer: Moderate assistance, +2 for physical assistance, Stand-pivot, BSC Toileting- Clothing Manipulation and Hygiene: Total assistance, Sit to/from stand, +2 for physical assistance Functional mobility during ADLs: Moderate assistance, +2 for physical assistance General ADL Comments: Encouraged pt to be trying to use RUE.  Educated on UB dressing technique.     Mobility  Overal bed mobility: Needs Assistance Bed Mobility: Supine to Sit Rolling: Min assist Supine to sit: Supervision Sit to supine: Min assist General bed mobility comments: Min A to bring trunk upright after rolling to the R to sit EOB, Min A bring LEs back onto bed to return to supine    Transfers  Overall transfer level: Needs assistance Equipment used: 2 person hand held assist Transfers: Sit to/from Stand, Stand Pivot Transfers Sit to Stand: Min assist, +2 safety/equipment Stand pivot transfers: Mod assist, +2 physical assistance General transfer comment: Less assist for first stand pivot to [facility] and then pt transferred from there to chair.     Ambulation / Gait / Stairs / Wheelchair Mobility  Ambulation/Gait Ambulation/Gait assistance: Min assist, Mod assist, +2 physical assistance Ambulation Distance (Feet): 20 Feet (69ft x 2) Assistive device: 2 person hand held assist Gait Pattern/deviations: Step-through pattern, Decreased stride length, Steppage, Ataxic, Scissoring, Trunk flexed General Gait Details: ambulated with R arm in sling, +2 assist for stability due to buckling in LEs with weight shifting, excessive trunk flexion able to fix for ~5 seconds with cueing Gait velocity: decreased Gait velocity interpretation: Below normal speed for age/gender    Posture / Balance Dynamic Sitting Balance Sitting balance - Comments: sits EOB x about 10 min  Balance Overall balance assessment: Needs assistance Sitting-balance support: Feet supported, Single extremity supported Sitting balance-Leahy Scale: Fair Sitting balance - Comments: sits EOB x about 10 min  Standing balance support: Bilateral upper extremity supported, During functional activity Standing balance-Leahy Scale: Poor Standing balance comment: min/mod support and L hand on back of chair for support    Special needs/care consideration BiPAP/CPAP  no CPM no Continuous Drip IV Dextrose 5% 60ml/hr Dialysis no  Life Vest no Oxygen  Special Bed no Trach Size no Wound Vac (area) no  Skin Back abrasion  Bowel mgmt: Last BM 06/16/15, loose per pt. Bladder  mgmt: using BSC with assist , continent Diabetic mgmt      Previous Home Environment Living Arrangements: Spouse/significant other Available Help at Discharge: Family, Friend(s), Available 24 hours/day Type of Home: House Home Layout: Two level, Able to live on main level with bedroom/bathroom, Full bath on main level Home Access: Stairs to enter [employer] of Steps: 3 Bathroom Shower/Tub: Gaffer, Chiropodist: [place]: No Additional Comments: Pt's husband is a Theme park manager and states he has tremendous flexibility in his schedule. I explained to him that pt. will need 24 hour assist for an unknown time. He states he has a strong support network and that he will definitely be able to arrange 24 hour care  Discharge Living Setting Plans for Discharge Living Setting: Patient's home Type of Home at Discharge: House Discharge Home Layout: Two level, Able to live on main level with bedroom/bathroom Discharge Home Access: Stairs to enter Entrance Stairs-Rails: None Entrance Stairs-Number of Steps: 3 Discharge Bathroom Shower/Tub: Tub/shower unit, Walk-in shower Discharge Bathroom Toilet: Standard Discharge Bathroom Accessibility: Yes Does the patient have any problems obtaining your medications?: No  Social/Family/Support Systems Patient Roles: Spouse, Parent, Other (Comment) (has 5 grandchildren) Contact Information: husband Shamarria Wacht, [phone] Ability/Limitations of Caregiver: Coralyn Mark pastors a church but has a very flexible schedule. He states that pt. will never be left alone as he has a strong support Media planner Availability: 24/7 (initially  24/7) Discharge Plan Discussed with Primary Caregiver: Yes Is Caregiver In Agreement with Plan?: Yes Does Caregiver/Family have Issues with Lodging/Transportation while Pt is in Rehab?: No   Goals/Additional Needs Patient/Family Goal for Rehab: supervision and min assist PT/OT/SLP Expected length of stay: 18-21 days Cultural Considerations: [employer] Dietary Needs: NPO, has PEG  Equipment Needs: TBA Pt/Family Agrees to Admission and willing to participate: Yes Program Orientation Provided & Reviewed with Pt/Caregiver Including Roles & Responsibilities: Yes   Decrease burden of Care through IP rehab admission: no   Possible need for SNF placement upon discharge: Not anticipated   Patient Condition: This patient's medical and functional status has changed since the consult dated: 06/10/15 in which the Rehabilitation Physician determined and documented that the patient's condition is appropriate for intensive rehabilitative care in an inpatient rehabilitation facility. See "History of Present Illness" (above) for medical update. Functional changes are:+2 min and mod assist for transfer, ambulated 20' x 2 min and mod assist . Patient's medical and functional status update has been discussed with the Rehabilitation physician and patient remains appropriate for inpatient rehabilitation. Will admit to inpatient rehab today.  Preadmission Screen Completed By: Gerlean Ren, 06/16/2015 5:42 PM ______________________________________________________________________  Discussed status with Dr. Posey Pronto on 06/16/15 at 1742 and received telephone approval for admission today.  Admission Coordinator: Gerlean Ren, time [id number] /Date 06/16/15          Cosigned by: Ankit Lorie Phenix, MD at 06/16/2015 5:45 PM  Revision History     Date/Time User Provider Type Action   06/16/2015 5:45 PM Ankit Lorie Phenix, MD Physician Cosign   06/16/2015 5:43 PM Gerlean Ren Rehab Admission Coordinator  Sign

## 2015-06-16 NOTE — Progress Notes (Signed)
Transferred to rehab 

## 2015-06-17 ENCOUNTER — Inpatient Hospital Stay (HOSPITAL_COMMUNITY): Payer: BC Managed Care – PPO | Admitting: Speech Pathology

## 2015-06-17 ENCOUNTER — Inpatient Hospital Stay (HOSPITAL_COMMUNITY): Payer: BC Managed Care – PPO | Admitting: *Deleted

## 2015-06-17 ENCOUNTER — Inpatient Hospital Stay (HOSPITAL_COMMUNITY): Payer: BC Managed Care – PPO | Admitting: Occupational Therapy

## 2015-06-17 LAB — COMPREHENSIVE METABOLIC PANEL
ALBUMIN: 2.2 g/dL — AB (ref 3.5–5.0)
ALK PHOS: 427 U/L — AB (ref 38–126)
ALT: 63 U/L — AB (ref 14–54)
ANION GAP: 8 (ref 5–15)
AST: 84 U/L — AB (ref 15–41)
BUN: 13 mg/dL (ref 6–20)
CALCIUM: 8.9 mg/dL (ref 8.9–10.3)
CO2: 26 mmol/L (ref 22–32)
CREATININE: 0.57 mg/dL (ref 0.44–1.00)
Chloride: 100 mmol/L — ABNORMAL LOW (ref 101–111)
GFR calc Af Amer: >60 mL/min (ref >60)
GFR calc non Af Amer: >60 mL/min (ref >60)
GLUCOSE: 149 mg/dL — AB (ref 65–99)
Potassium: 4.1 mmol/L (ref 3.5–5.1)
SODIUM: 134 mmol/L — AB (ref 135–145)
Total Bilirubin: 0.3 mg/dL (ref 0.3–1.2)
Total Protein: 6.9 g/dL (ref 6.5–8.1)

## 2015-06-17 LAB — CBC WITH DIFFERENTIAL/PLATELET
BASOS ABS: 0 10*3/uL (ref 0.0–0.1)
BASOS PCT: 1 %
EOS ABS: 0.1 10*3/uL (ref 0.0–0.7)
Eosinophils Relative: 1 %
HCT: 34.4 % — ABNORMAL LOW (ref 36.0–46.0)
HEMOGLOBIN: 10.2 g/dL — AB (ref 12.0–15.0)
Lymphocytes Relative: 24 %
Lymphs Abs: 1.4 10*3/uL (ref 0.7–4.0)
MCH: 27.9 pg (ref 26.0–34.0)
MCHC: 29.7 g/dL — ABNORMAL LOW (ref 30.0–36.0)
MCV: 94.2 fL (ref 78.0–100.0)
Monocytes Absolute: 1.1 10*3/uL — ABNORMAL HIGH (ref 0.1–1.0)
Monocytes Relative: 19 %
NEUTROS ABS: 3.3 10*3/uL (ref 1.7–7.7)
NEUTROS PCT: 55 %
Platelets: 723 10*3/uL — ABNORMAL HIGH (ref 150–400)
RBC: 3.65 MIL/uL — AB (ref 3.87–5.11)
RDW: 20.2 % — ABNORMAL HIGH (ref 11.5–15.5)
WBC: 5.9 10*3/uL (ref 4.0–10.5)

## 2015-06-17 LAB — GLUCOSE, CAPILLARY
GLUCOSE-CAPILLARY: 159 mg/dL — AB (ref 65–99)
GLUCOSE-CAPILLARY: 199 mg/dL — AB (ref 65–99)
GLUCOSE-CAPILLARY: 203 mg/dL — AB (ref 65–99)
Glucose-Capillary: 122 mg/dL — ABNORMAL HIGH (ref 65–99)
Glucose-Capillary: 131 mg/dL — ABNORMAL HIGH (ref 65–99)
Glucose-Capillary: 135 mg/dL — ABNORMAL HIGH (ref 65–99)

## 2015-06-17 MED ORDER — SODIUM CHLORIDE 0.9% FLUSH
10.0000 mL | INTRAVENOUS | Status: DC | PRN
  Administered 2015-06-17 – 2015-06-30 (×19): 10 mL
  Filled 2015-06-17 (×18): qty 40

## 2015-06-17 MED ORDER — JEVITY 1.2 CAL PO LIQD
1000.0000 mL | ORAL | Status: DC
  Administered 2015-06-17 – 2015-06-18 (×2): 1000 mL
  Administered 2015-06-18: 70 mL/h
  Administered 2015-06-19 – 2015-06-23 (×4): 1000 mL
  Filled 2015-06-17: qty 237
  Filled 2015-06-17 (×10): qty 1000
  Filled 2015-06-17: qty 237
  Filled 2015-06-17 (×3): qty 1000

## 2015-06-17 NOTE — Progress Notes (Signed)
Initial Nutrition Assessment  DOCUMENTATION CODES:   Not applicable  INTERVENTION:  Increase Jevity 1.2 formula via PEG to new goal rate of 70 ml/hr x 20 hours (may hold TF up to 4 hours for therapy) to provide 1680 kcal, 78 grams of protein, and 1134 ml of free water.   Continue free water flushes of 100 ml QID.   RD to continue to monitor.   NUTRITION DIAGNOSIS:   Inadequate oral intake related to inability to eat as evidenced by NPO status.  GOAL:   Patient will meet greater than or equal to 90% of their needs  MONITOR:   TF tolerance, Weight trends, Labs, I & O's, Skin  REASON FOR ASSESSMENT:   Consult Enteral/tube feeding initiation and management  ASSESSMENT:   60 y.o.right handed female with history of Sjogrens disease/rheumatoid arthritis maintained on plaquenil and Prednisone, noted history of seizure but not on antiepileptic meds and dysphagia.  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 Echocardiogram with ejection fraction of 65% no wall motion abnormalities. 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  Modified barium swallow 06/09/2015 shows severe pharyngeal dysphagia as well as esophageal stricture. Patient underwent gastrostomy tube per interventional radiology 06/14/2015.  Pt has been tolerating her tube feeding fine with no other difficulties. Pt currently is receiving Jevity 1.2 formula via PEG at rate of 60 ml/hr x 20 hours which is providing 1440 kcal (87% of kcal needs), 67 grams of protein (95% of protein needs), and  972 ml of free water. RD to modify tube feeding to better meet nutrition needs. Usual body weight ~130 lbs. Pt with no observed significant fat or muscle mass loss.   Labs and medications reviewed.   Diet Order:  Diet NPO time specified  Skin:   (Incision on abdomen)  Last BM:  3/1  Height:   Ht Readings from Last 1 Encounters:  06/16/15 5\' 1"  (1.549 m)    Weight:   Wt  Readings from Last 1 Encounters:  06/17/15 121 lb 14.4 oz (55.293 kg)    Ideal Body Weight:  47.7 kg  BMI:  Body mass index is 23.04 kg/(m^2).  Estimated Nutritional Needs:   Kcal:  1650-1850  Protein:  70-80 grams  Fluid:  1.6  - 1.8 L/day  EDUCATION NEEDS:   No education needs identified at this time  Corrin Parker, MS, RD, LDN Pager # [phone] After hours/ weekend pager # [phone]

## 2015-06-17 NOTE — Progress Notes (Signed)
60 y.o.right handed female with history of Sjogrens disease/rheumatoid arthritis maintained on plaquenil and Prednisone, noted history of seizure but not on antiepileptic meds and dysphagia. Patient diagnosed 4 days prior withFlu and placed on Tamiflu. Lives with spouse independent prior to admission. 2 level home with bedroom on main level. 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 Presented 05/29/2015 with right side weakness and gait instability. Patient with severe restrictive airway distress when he presented to the ED with oxygen saturations in the 70s requiring nonrebreathing mask and later intubated with slow extubation follow-up per critical care pulmonary services. Chest x-ray showed large right lung consolidative changes. CT angiogram chest no evidence of pulmonary emboli. Cranial CT scan negative. 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 Echocardiogram with ejection fraction of 65% no wall motion abnormalities. 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 EEG was negative for seizure. Patient did not receive TPA. Blood cultures showed group A strep pansensitive 05/30/2015 and currently maintained on Rocephin with droplet contact precautions with question duration of antibiotics. Hospital course atrial flutter currently maintained Lovenox 40 mg daily and asymptomatic with low dose beta blocker added. Modified barium swallow 06/15/2015 shows moderate pharyngeal dysphagia as well as esophageal stricture. Patient underwent gastrostomy tube per interventional radiology 06/14/2015. Currently maintained on Lovenox for DVT prophylaxis. Plan is to begin aspirin/ Plavix therapy for CVA prophylaxis after PEG tube feeds initiated  Subjective/Complaints: Discussed swallowing status with speech therapy. Patient without breathing issues overnight.  Review of systems negative for chest pain, shortness of breath, nausea vomiting  diarrhea constipation Objective: Vital Signs: Blood pressure 102/56, pulse 90, temperature 97.8 F (36.6 C), temperature source Oral, resp. rate 18, height _0  (1.549 m), weight 55.293 kg (121 lb 14.4 oz), SpO2 95 %. Dg Swallowing Func-speech Pathology  06/15/2015  Objective Swallowing Evaluation: Type of Study: MBS-Modified Barium Swallow Study Patient Details Name: Makiah Foye MRN: [id number] Date of Birth: Dec 20, 1955 Today's Date: 06/15/2015 Time: SLP Start Time (ACUTE ONLY): 1206-SLP Stop Time (ACUTE ONLY): 1223 SLP Time Calculation (min) (ACUTE ONLY): 17 min Past Medical History: Past Medical History Diagnosis Date . Hypothyroidism  . Seizures ([place])  . Adenopathy  . Scoliosis  . Spleen anomaly  . Anemia  . Sjogren's disease ([place])  . Raynaud disease  . Shingles  . Osteopenia  Past Surgical History: Past Surgical History Procedure Laterality Date . Tonsillectomy   . Breast cyst aspiration Right 1990's   neg HPI: Ms/ Hofstra is a 60 y/o woman with a history of Sjogren's disease, hypothyroidism and dysphagia. She was diagnosed with the flu (A) about 4 days PTA and started on tamiflu. 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  Intubated from 2/12 to 2/21.There is a question of esophageal stricture; pt was seen by GI at [facility], complained of globus with pills and some solids, occuring 2-3x a week. Was scheduled for an EGD at [facility], but appears to have cancelled the procedure. 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 Subjective: pt alert, without complaints Assessment / Plan / Recommendation CHL IP CLINICAL IMPRESSIONS 06/15/2015 Therapy Diagnosis Moderate pharyngeal phase dysphagia;Moderate cervical esophageal phase dysphagia Clinical Impression Pt shows mild-moderate improvements since initial MBS, although still with reduced airway protection and pharyngeal clearance.  Hyolaryngeal movement is reduced and minimal epiglottic deflection results in poor airway protection.  Delay in swallow initiation results in small amounts of silent penetration to the vocal cords with tsp of thin and nectar thick liquids. Cough is strong but not entirely effective at expelling penetrates. A chin tuck prevents penetration with nectar thick liquids, but even tsp-sized boluses required multiple swallows to try to clear diffuse pharyngeal residue. While it is not yet safe for her to resume a PO diet, would recommend use of therapeutic boluses of nectar thick liquids by spoon with SLP only. Impact on safety and function Moderate aspiration risk;Severe aspiration risk   CHL IP TREATMENT RECOMMENDATION 06/15/2015 Treatment Recommendations Therapy as outlined in treatment plan below   Prognosis 06/15/2015 Prognosis for Safe Diet Advancement Fair Barriers to Reach Goals Severity of deficits Barriers/Prognosis Comment -- CHL IP DIET RECOMMENDATION 06/15/2015 SLP Diet Recommendations Alternative means - temporary;NPO Liquid Administration via -- Medication Administration Via alternative means Compensations -- Postural Changes --   CHL IP OTHER RECOMMENDATIONS 06/15/2015 Recommended Consults -- Oral Care Recommendations Oral care QID Other Recommendations --   CHL IP FOLLOW UP RECOMMENDATIONS 06/15/2015 Follow up Recommendations Inpatient Rehab   CHL IP FREQUENCY AND DURATION 06/15/2015 Speech Therapy Frequency (ACUTE ONLY) min 3x week Treatment Duration 2 weeks      CHL IP ORAL PHASE 06/15/2015 Oral Phase WFL Oral - Pudding Teaspoon -- Oral - Pudding Cup -- Oral - Honey Teaspoon -- Oral - Honey Cup -- Oral - Nectar Teaspoon -- Oral - Nectar Cup -- Oral - Nectar Straw -- Oral - Thin Teaspoon -- Oral - Thin Cup -- Oral - Thin Straw -- Oral - Puree -- Oral - Mech Soft -- Oral - Regular -- Oral - Multi-Consistency -- Oral - Pill -- Oral Phase - Comment --  CHL IP PHARYNGEAL PHASE 06/15/2015 Pharyngeal Phase Impaired  Pharyngeal- Pudding Teaspoon -- Pharyngeal -- Pharyngeal- Pudding Cup -- Pharyngeal -- Pharyngeal- Honey Teaspoon NT Pharyngeal -- Pharyngeal- Honey Cup -- Pharyngeal -- Pharyngeal- Nectar Teaspoon Delayed swallow initiation-vallecula;Reduced epiglottic inversion;Reduced anterior laryngeal mobility;Reduced laryngeal elevation;Reduced airway/laryngeal closure;Reduced tongue base retraction;Reduced pharyngeal peristalsis;Penetration/Aspiration before swallow;Pharyngeal residue - valleculae;Pharyngeal residue - pyriform;Compensatory strategies attempted (with notebox) Pharyngeal Material enters airway, CONTACTS cords and not ejected out Pharyngeal- Nectar Cup -- Pharyngeal -- Pharyngeal- Nectar Straw -- Pharyngeal -- Pharyngeal- Thin Teaspoon Delayed swallow initiation-vallecula;Reduced epiglottic inversion;Reduced anterior laryngeal mobility;Reduced laryngeal elevation;Reduced airway/laryngeal closure;Reduced tongue base retraction;Reduced pharyngeal peristalsis;Penetration/Aspiration before swallow;Pharyngeal residue - valleculae;Pharyngeal residue - pyriform;Compensatory strategies attempted (with notebox) Pharyngeal Material enters airway, CONTACTS cords and not ejected out Pharyngeal- Thin Cup NT Pharyngeal -- Pharyngeal- Thin Straw -- Pharyngeal -- Pharyngeal- Puree NT Pharyngeal -- Pharyngeal- Mechanical Soft -- Pharyngeal -- Pharyngeal- Regular -- Pharyngeal -- Pharyngeal- Multi-consistency -- Pharyngeal -- Pharyngeal- Pill -- Pharyngeal -- Pharyngeal Comment --  CHL IP CERVICAL ESOPHAGEAL PHASE 06/15/2015 Cervical Esophageal Phase Impaired Pudding Teaspoon -- Pudding Cup -- Honey Teaspoon -- Honey Cup -- Nectar Teaspoon Reduced cricopharyngeal relaxation Nectar Cup -- Nectar Straw -- Thin Teaspoon Reduced cricopharyngeal relaxation Thin Cup -- Thin Straw -- Puree -- Mechanical Soft -- Regular -- Multi-consistency -- Pill -- Cervical Esophageal Comment -- No flowsheet data found. Maxcine Ham, M.A. CCC-SLP  [phone] Maxcine Ham 06/15/2015, 1:51 PM              Results for orders placed or performed during the hospital encounter of 06/16/15 (from the past 72 hour(s))  CBC     Status: Abnormal   Collection Time: 06/16/15  7:40 PM  Result Value Ref Range   WBC 5.8 4.0 -  10.5 K/uL   RBC 3.31 (L) 3.87 - 5.11 MIL/uL   Hemoglobin 9.4 (L) 12.0 - 15.0 g/dL   HCT 40.9 (L) 92.7 - 80.0 %   MCV 93.1 78.0 - 100.0 fL   MCH 28.4 26.0 - 34.0 pg   MCHC 30.5 30.0 - 36.0 g/dL   RDW 44.7 (H) 15.8 - 06.3 %   Platelets 707 (H) 150 - 400 K/uL  Creatinine, serum     Status: None   Collection Time: 06/16/15  7:40 PM  Result Value Ref Range   Creatinine, Ser 0.51 0.44 - 1.00 mg/dL   GFR calc non Af Amer >60 >60 mL/min   GFR calc Af Amer >60 >60 mL/min    Comment: (NOTE) The eGFR has been calculated using the CKD EPI equation. This calculation has not been validated in all clinical situations. eGFR's persistently <60 mL/min signify possible Chronic Kidney Disease.   Glucose, capillary     Status: Abnormal   Collection Time: 06/16/15  8:18 PM  Result Value Ref Range   Glucose-Capillary 147 (H) 65 - 99 mg/dL  Glucose, capillary     Status: Abnormal   Collection Time: 06/17/15 12:12 AM  Result Value Ref Range   Glucose-Capillary 122 (H) 65 - 99 mg/dL  Glucose, capillary     Status: Abnormal   Collection Time: 06/17/15  4:02 AM  Result Value Ref Range   Glucose-Capillary 135 (H) 65 - 99 mg/dL  CBC WITH DIFFERENTIAL     Status: Abnormal   Collection Time: 06/17/15  5:47 AM  Result Value Ref Range   WBC 5.9 4.0 - 10.5 K/uL   RBC 3.65 (L) 3.87 - 5.11 MIL/uL   Hemoglobin 10.2 (L) 12.0 - 15.0 g/dL   HCT 86.8 (L) 54.8 - 83.0 %   MCV 94.2 78.0 - 100.0 fL   MCH 27.9 26.0 - 34.0 pg   MCHC 29.7 (L) 30.0 - 36.0 g/dL   RDW 14.1 (H) 59.7 - 33.1 %   Platelets 723 (H) 150 - 400 K/uL   Neutrophils Relative % 55 %   Neutro Abs 3.3 1.7 - 7.7 K/uL   Lymphocytes Relative 24 %   Lymphs Abs 1.4 0.7 - 4.0 K/uL    Monocytes Relative 19 %   Monocytes Absolute 1.1 (H) 0.1 - 1.0 K/uL   Eosinophils Relative 1 %   Eosinophils Absolute 0.1 0.0 - 0.7 K/uL   Basophils Relative 1 %   Basophils Absolute 0.0 0.0 - 0.1 K/uL  Comprehensive metabolic panel     Status: Abnormal   Collection Time: 06/17/15  5:47 AM  Result Value Ref Range   Sodium 134 (L) 135 - 145 mmol/L   Potassium 4.1 3.5 - 5.1 mmol/L   Chloride 100 (L) 101 - 111 mmol/L   CO2 26 22 - 32 mmol/L   Glucose, Bld 149 (H) 65 - 99 mg/dL   BUN 13 6 - 20 mg/dL   Creatinine, Ser 2.50 0.44 - 1.00 mg/dL   Calcium 8.9 8.9 - 87.1 mg/dL   Total Protein 6.9 6.5 - 8.1 g/dL   Albumin 2.2 (L) 3.5 - 5.0 g/dL   AST 84 (H) 15 - 41 U/L   ALT 63 (H) 14 - 54 U/L   Alkaline Phosphatase 427 (H) 38 - 126 U/L   Total Bilirubin 0.3 0.3 - 1.2 mg/dL   GFR calc non Af Amer >60 >60 mL/min   GFR calc Af Amer >60 >60 mL/min    Comment: (NOTE) The eGFR  has been calculated using the CKD EPI equation. This calculation has not been validated in all clinical situations. eGFR's persistently <60 mL/min signify possible Chronic Kidney Disease.    Anion gap 8 5 - 15      General: No acute distress Mood and affect are appropriate Heart: Regular rate and rhythm no rubs murmurs or extra sounds Lungs: Clear to auscultation, breathing unlabored, no rales or wheezes, decreased breath sounds at bases Abdomen: Positive bowel sounds, soft nontender to palpation, nondistended Extremities: No clubbing, cyanosis, or edema Skin: No evidence of breakdown, no evidence of rash Neurologic: Cranial nerves II through XII intact, motor strength is 5/5 in left deltoid, bicep, tricep, grip, hip flexor, knee extensors, ankle dorsiflexor and plantar flexor, 3 minus right deltoid, bicep, tricep, grip,, 4 minus right hip flexor and knee extensor and 2 minus ankle dorsiflexor Sensory exam normal sensation to light touch in bilateral upper and lower extremities  Musculoskeletal: Full passive  range  of motion in all 4 extremities. No joint swelling   Assessment/Plan: 1. Functional deficits secondary to Right side weakness/dysphagia secondary to left frontal lobe infarct complicated by respiratory failure from septic shock. which require 3+ hours per day of interdisciplinary therapy in a comprehensive inpatient rehab setting. Physiatrist is providing close team supervision and 24 hour management of active medical problems listed below. Physiatrist and rehab team continue to assess barriers to discharge/monitor patient progress toward functional and medical goals. FIM:                   Function - Comprehension Comprehension: Auditory Comprehension assist level: Follows basic conversation/direction with no assist  Function - Expression Expression: Verbal Expression assist level: Expresses basic needs/ideas: With no assist  Function - Social Interaction Social Interaction assist level: Interacts appropriately 90% of the time - Needs monitoring or encouragement for participation or interaction.  Function - Problem Solving Problem solving assist level: Solves basic 90% of the time/requires cueing < 10% of the time  Function - Memory Memory assist level: Recognizes or recalls 90% of the time/requires cueing < 10% of the time Patient normally able to recall (first 3 days only): Current season, Location of own room, Staff names and faces, That he or she is in a hospital  Medical Problem List and Plan: 1.  Right side weakness/dysphagia secondary to left frontal lobe infarct complicated by respiratory failure.initiate therapy program 2.  DVT Prophylaxis/Anticoagulation: SCDs. Monitor for any signs of DVT 3. Pain Management: Tylenol as needed 4. Dysphagia/esophageal stricture. Status post gastrostomy tube 06/14/2015 per interventional radiology. Speech therapy follow-up. Plan outpatient EGD endoscopy for dilatation of stricture, swallowing dysfunction improving when  comparing the modified barium swallow performed 06/09/2015 versus 06/15/2015 5. Neuropsych: This patient is capable of making decisions on her own behalf. 6. Skin/Wound Care: Routine skin checks 7. Fluids/Electrolytes/Nutrition: Routine I&O's with follow-up chemistries 8. ID. Strep pyogenes bacteremia. Followed by infectious disease. Continue ceftriaxone questions 6 week course and confirm with Dr. Drucilla Schmidt. Maintain contact precautions 9. Atrial flutter with RVR. Lopressor 12.5 mg twice a day 10. Rheumatoid arthritis/Sjorgens disease. Discuss resuming plaquenil and chronic prednisone.no active joint inflammation at the current time 11. Hyperglycemia secondary to chronic prednisone/tube feeds. Hemoglobin A1c 6.6. Check blood sugars while on tube feeds 12. Hyperlipidemia. Lipitor 13. Hypothyroidism. Resume Synthroid   LOS (Days) 1 A FACE TO FACE EVALUATION WAS PERFORMED  Breton Berns E 06/17/2015, 8:30 AM

## 2015-06-17 NOTE — Progress Notes (Signed)
Social Work Assessment and Plan  Patient Details  Name: [name] [name] MRN: [id number] Date of Birth: [birth year]  Today's Date: 06/17/2015  Problem List:  Patient Active Problem List   Diagnosis Date Noted  . Ischemic stroke of frontal lobe ([place]) 06/16/2015  . Dysphagia as late effect of cerebrovascular disease   . Hemiparesis affecting dominant side as late effect of stroke ([place])   . Esophageal stricture   . Bacteremia   . Atrial flutter ([place])   . Rheumatoid arthritis ([place])   . Hyperglycemia   . Thyroid activity decreased   . Infection due to Streptococcus pyogenes 06/12/2015  . Severe sepsis with septic shock ([place]) 06/12/2015  . Hematuria 06/12/2015  . Dysphagia 06/12/2015  . 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 CVA (cerebral infarction)   . Acute respiratory failure ([place])   . Encounter for diagnostic procedure   . Ventilator dependence ([place])   . Carotid thromboses   . HLD (hyperlipidemia)   . Respiratory failure ([place])   . Stroke (cerebrum) ([place])   . Research study patient   . Endotracheal tube present   . Endotracheally intubated   . Encounter for feeding tube placement   . Altered mental state   . Weakness   . Sepsis ([place]) 05/30/2015  . CAP (community acquired pneumonia)   . Encephalopathy   . Encounter for central line placement   . Hypoxia   . Lactic acidosis    Past Medical History:  Past Medical History  Diagnosis Date  . Hypothyroidism   . Seizures ([place])   . Adenopathy   . Scoliosis   . Spleen anomaly   . Anemia   . Sjogren's disease ([place])   . Raynaud disease   . Shingles   . Osteopenia    Past Surgical History:  Past Surgical History  Procedure Laterality Date  . Tonsillectomy    . Breast cyst aspiration Right 1990's    neg   Social History:  reports that she has never smoked. She has never used smokeless tobacco. She reports that she does not drink alcohol or use illicit drugs.  Family / Support Systems Marital Status: Married Patient  Roles: Spouse, Parent, Other (Comment) (grandparent and employee) Spouse/Significant Other: Annastacia Duba - husband - [phone] Children: 3 children (2 are step children) Other Supports: school; extended family; church Anticipated Caregiver: husband Ability/Limitations of Caregiver: Coralyn Mark pastors a church but has a very flexible schedule.  He states that pt. will never be left alone as he has a strong support network Caregiver Availability: 24/7 Family Dynamics: Pt reports a good relationship between she and her husband.  Social History Preferred language: English Religion: Non-Denominational Cultural Background: Pt grew up in [place].  Husband is pastor of [employer] in [place], where he has been for 10 years. Education: Dispensing optician for education degree Read: Yes Write: Yes Employment Status: Employed ([employer]) Name of Employer: [employer] of Employment: 30 Return to Work Plans: Pt would like to return to school eventually, but she stated she knows it will be a while before she can return. Legal History/Current Legal Issues: none reported Guardian/Conservator: N/A - MD has stated that pt is capable of making her own decisions.   Abuse/Neglect Physical Abuse: Denies Verbal Abuse: Denies Sexual Abuse: Denies Exploitation of patient/patient's resources: Denies Self-Neglect: Denies  Emotional Status Pt's affect, behavior and adjustment status: Pt's affect was flat, but she seemed very tired, too.  CSW will continue assess how  pt is adjusting.  She reports feeling okay emotionally, though, and CSW will continue to f/u with her on this.  CSW also offered support and told her of Neuropsychology support available to her. Recent Psychosocial Issues: none reported Psychiatric History: none reported Substance Abuse History: none reported  Patient / Family Perceptions, Expectations & Goals Pt/Family understanding  of illness & functional limitations: Pt reported a good understanding of her condition and all that has transpired during her hospitalization.  CSW will assess this with pt's husband, as well, once we have a chance to meet. Premorbid pt/family roles/activities: Pt enjoys walking, spending time in nature, doing crafts (card making), and baking and decorating cakes. 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 Pt/family expectations/goals: Pt wants to regain function in her right arm/hand and wants to gain as much independence as possible so that she can take care of herself.  Community Resources Express Scripts: None Premorbid Home Care/DME Agencies: None Transportation available at discharge: husband Resource referrals recommended: Neuropsychology, Support group (specify)  Discharge Planning Living Arrangements: Spouse/significant other Support Systems: Spouse/significant other, Children, Friends/neighbors, Immunologist, Other relatives Type of Residence: Private residence Insurance Resources: Multimedia programmer (specify) (State Plan [employer]) Museum/gallery curator Resources: Employment, Secondary school teacher Screen Referred: No Money Management: Patient, Spouse Does the patient have any problems obtaining your medications?: No Home Management: Pt's husband can manage the home while pt is recovering. Patient/Family Preliminary Plans: Pt's husband plans to take pt home and provide supervision and some minimal assistance.  When he needs to be away from the home, he has church members and a friend who has a caregiver agency who will assist him. Barriers to Discharge: Steps (but can then live on main level) Social Work Anticipated Follow Up Needs: HH/OP, Support Group Expected length of stay: 12 to 14 days  Clinical Impression CSW met with pt to introduce self and role of CSW, as  well as to complete assessment.  Pt was able to answer CSW's questions, although she seemed very fatigued.  CSW offered support to pt due to extended hospitalization with multiple medical issues.  Pt was grateful to be on Rehab and wants to regain her independence.  CSW later had a chance to meet pt's husband.  He had FMLA paperwork for MD to complete and sign and a copy of pt's living will.  CSW put the living will on pt's chart and asked PA/MD to complete FMLA paperwork.  CSW to fax these documents once completed.  Pt's husband is a Clinical biochemist for the police department and a Theme park manager at [employer] in [place].  He plans to be with pt much of the time and church family can assist when he needs to be away.  Husband is also pleased that pt is on rehab and he wants her to stay as long as she needs to be here.  CSW will continue to follow and assist as needed.  Arabella Revelle, Silvestre Mesi 06/17/2015, 3:11 PM

## 2015-06-17 NOTE — Evaluation (Addendum)
Speech Language Pathology Assessment and Plan  Patient Details  Name: [name] [name] MRN: [id number] Date of Birth: Feb 21, 1956  SLP Diagnosis: Cognitive Impairments;Dysphagia  Rehab Potential: Good ELOS: 12-14 days     Today's Date: 06/17/2015 SLP Individual Time: 0800-0900 SLP Individual Time Calculation (min): 60 min   Problem List:  Patient Active Problem List   Diagnosis Date Noted  . Ischemic stroke of frontal lobe ([place]) 06/16/2015  . Dysphagia as late effect of cerebrovascular disease   . Hemiparesis affecting dominant side as late effect of stroke ([place])   . Esophageal stricture   . Bacteremia   . Atrial flutter ([place])   . Rheumatoid arthritis ([place])   . Hyperglycemia   . Thyroid activity decreased   . Infection due to Streptococcus pyogenes 06/12/2015  . Severe sepsis with septic shock ([place]) 06/12/2015  . Hematuria 06/12/2015  . Dysphagia 06/12/2015  . 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 CVA (cerebral infarction)   . Acute respiratory failure ([place])   . Encounter for diagnostic procedure   . Ventilator dependence ([place])   . Carotid thromboses   . HLD (hyperlipidemia)   . Respiratory failure ([place])   . Stroke (cerebrum) ([place])   . Research study patient   . Endotracheal tube present   . Endotracheally intubated   . Encounter for feeding tube placement   . Altered mental state   . Weakness   . Sepsis ([place]) 05/30/2015  . CAP (community acquired pneumonia)   . Encephalopathy   . Encounter for central line placement   . Hypoxia   . Lactic acidosis    Past Medical History:  Past Medical History  Diagnosis Date  . Hypothyroidism   . Seizures ([place])   . Adenopathy   . Scoliosis   . Spleen anomaly   . Anemia   . Sjogren's disease ([place])   . Raynaud disease   . Shingles   . Osteopenia    Past Surgical History:  Past Surgical History  Procedure Laterality Date  . Tonsillectomy    . Breast cyst aspiration Right 1990's    neg    Assessment / Plan /  Recommendation Clinical Impression   [name] [name] is a 60 y.o.right handed  female who presented 05/29/2015 with right side weakness and gait instability. Patient with severe restrictive airway distress when she presented to the ED with oxygen saturations in the 70s requiring nonrebreathing mask and later intubated with slow extubation follow-up per critical care pulmonary services. Chest x-ray showed large right lung consolidative changes.  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 Modified barium swallow 06/09/2015 shows severe pharyngeal dysphagia as well as esophageal stricture. Patient underwent gastrostomy tube per interventional radiology 06/14/2015.  Plan for EGD with endoscopy in 6-8 weeks as outpatient for dilatation of stricture. Patient was admitted for a comprehensive rehabilitation program on 06/16/2015.  SLP evaluation completed on 06/17/2015 with the following results:  Pt presents with a moderately severe pharyngeal dysphagia consistent with most recent objective swallowing study characterized by suspected delayed swallow initiation, weakened hyolaryngeal elevation, and multiple swallows with boluses of purees, nectar thick liquids, and thin liquids via teaspoon.  No throat clearing or coughing observed across trials and pt's vocal quality remained clear both before and after POs.  Recommend that pt remain NPO with alternative means of nutrition due to ongoing risk of aspiration with ongoing ST interventions for trials of nectar thick liquids via teaspoons to continue working towards re-initiation of PO diet.   Pt  also presents with mild working memory deficits which impacted her ability to complete functional math calculations on [employer] standardized cognitive assessment.  Given that pt was fully independent and employed as a Oncologist prior to admission; pt would benefit from [place] follow up while inpatient to address swallowing and cognitive function in order to maximize  functional independence and reduce burden of care prior to discharge.  Anticipate that pt will need either home health or outpatient ST follow up at discharge.    Skilled Therapeutic Interventions          Cognitive-linguistic and bedside swallow evaluation completed with results and recommendations reviewed with family.  SLP reviewed and reinforced pharyngeal strengthening exercises which were targeted in previous ST therapy sessions prior to CIR admission.  Pt was able to return demonstration of exercises with min verbal cues.  Pt was instructed pt complete exercises in between therapy sessions for a total of 25 repetitions over 2x/day.      SLP Assessment  Patient will need skilled Speech Lanaguage Pathology Services during CIR admission    Recommendations  SLP Diet Recommendations: NPO Medication Administration: Via alternative means Oral Care Recommendations: Oral care QID Recommendations for Other Services: Neuropsych consult Patient destination: Home Follow up Recommendations: Home Health SLP;Outpatient SLP;24 hour supervision/assistance Equipment Recommended: To be determined    SLP Frequency 3 to 5 out of 7 days   SLP Duration  SLP Intensity  SLP Treatment/Interventions 12-14 days   Minumum of 1-2 x/day, 30 to 90 minutes  Cognitive remediation/compensation;Cueing hierarchy;Dysphagia/aspiration precaution training;Environmental controls;Functional tasks;Internal/external aids    Pain Pain Assessment Pain Assessment: No/denies pain  Prior Functioning Cognitive/Linguistic Baseline: Within functional limits Type of Home: House  Lives With: Spouse Available Help at Discharge: Family;Friend(s);Available 24 hours/day Vocation: Full time employment Youth worker )  Function:  Eating Eating Eating activity did not occur: N/A Modified Consistency Diet:  (NPO with PEG tube; trials with SLP only  ) Eating Assist Level: Supervision or verbal cues            Cognition Comprehension Comprehension assist level: Follows basic conversation/direction with no assist  Expression   Expression assist level: Expresses basic needs/ideas: With no assist  Social Interaction Social Interaction assist level: Interacts appropriately 90% of the time - Needs monitoring or encouragement for participation or interaction.  Problem Solving Problem solving assist level: Solves basic 75 - 89% of the time/requires cueing 10 - 24% of the time  Memory Memory assist level: Recognizes or recalls 75 - 89% of the time/requires cueing 10 - 24% of the time   Short Term Goals: Week 1: SLP Short Term Goal 1 (Week 1): Pt will consume therapeutic trials of nectar thick liquids via teaspoon with minimal overt s/s of aspiration and supervision verbal cues for use of swallowing precautions over three consecutive sessions prior to repeat objective swallow study  SLP Short Term Goal 2 (Week 1): Pt will complete semi-complex functional home management tasks for >80% accuracy wtih supervision verbal cues for working memory.   SLP Short Term Goal 3 (Week 1): Pt will return demonstration of pharyngeal strengthening exercises for 25 repetitions each with supervision verbal cues SLP Short Term Goal 4 (Week 1): Pt will utilize compensatory memory strategies to facilitate recall of complex information wtih supervision verbal cues.    Refer to Care Plan for Long Term Goals  Recommendations for other services: None  Discharge Criteria: Patient will be discharged from SLP if patient refuses treatment 3 consecutive times without medical reason,  if treatment goals not met, if there is a change in medical status, if patient makes no progress towards goals or if patient is discharged from hospital.  The above assessment, treatment plan, treatment alternatives and goals were discussed and mutually agreed upon: by patient  Emilio Math 06/17/2015, 12:46 PM

## 2015-06-17 NOTE — Evaluation (Signed)
Occupational Therapy Assessment and Plan  Patient Details  Name: [name] [name] MRN: [id number] Date of Birth: May 27, 1955  OT Diagnosis: abnormal posture, altered mental status, hemiplegia affecting dominant side and muscle weakness (generalized) Rehab Potential: Rehab Potential (ACUTE ONLY): Excellent ELOS: 12-14 days   Today's Date: 06/17/2015 OT Individual Time: 1003-1103 OT Individual Time Calculation (min): 60 min     Problem List:  Patient Active Problem List   Diagnosis Date Noted  . Ischemic stroke of frontal lobe ([place]) 06/16/2015  . Dysphagia as late effect of cerebrovascular disease   . Hemiparesis affecting dominant side as late effect of stroke ([place])   . Esophageal stricture   . Bacteremia   . Atrial flutter ([place])   . Rheumatoid arthritis ([place])   . Hyperglycemia   . Thyroid activity decreased   . Infection due to Streptococcus pyogenes 06/12/2015  . Severe sepsis with septic shock ([place]) 06/12/2015  . Hematuria 06/12/2015  . Dysphagia 06/12/2015  . 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 CVA (cerebral infarction)   . Acute respiratory failure ([place])   . Encounter for diagnostic procedure   . Ventilator dependence ([place])   . Carotid thromboses   . HLD (hyperlipidemia)   . Respiratory failure ([place])   . Stroke (cerebrum) ([place])   . Research study patient   . Endotracheal tube present   . Endotracheally intubated   . Encounter for feeding tube placement   . Altered mental state   . Weakness   . Sepsis ([place]) 05/30/2015  . CAP (community acquired pneumonia)   . Encephalopathy   . Encounter for central line placement   . Hypoxia   . Lactic acidosis     Past Medical History:  Past Medical History  Diagnosis Date  . Hypothyroidism   . Seizures ([place])   . Adenopathy   . Scoliosis   . Spleen anomaly   . Anemia   . Sjogren's disease ([place])   . Raynaud disease   . Shingles   . Osteopenia    Past Surgical History:  Past Surgical History  Procedure Laterality Date   . Tonsillectomy    . Breast cyst aspiration Right 1990's    neg    Assessment & Plan Clinical Impression: Patient is a 60 y.o. year old female with recent admission to the hospital on 05/29/2015 with right side weakness and gait instability. Patient with severe restrictive airway distress when he presented to the ED with oxygen saturations in the 70s requiring nonrebreathing mask and later intubated with slow extubation follow-up per critical care pulmonary services. Chest x-ray showed large right lung consolidative changes. CT angiogram chest no evidence of pulmonary emboli. Cranial CT scan negative. 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 Echocardiogram with ejection fraction of 65% no wall motion abnormalities. 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 EEG was negative for seizure. Patient did not receive TPA. Blood cultures showed group A strep pansensitive 05/30/2015 and currently maintained on Rocephin with droplet contact precautions with question duration of antibiotics. Hospital course atrial flutter currently maintained Lovenox 40 mg daily and asymptomatic with low dose beta blocker added. Modified barium swallow 06/09/2015 shows severe pharyngeal dysphagia as well as esophageal stricture. Patient underwent gastrostomy tube per interventional radiology 06/14/2015  Patient transferred to [place] [facility] on 06/16/2015 .    Patient currently requires mod with basic self-care skills secondary to muscle weakness, impaired timing and sequencing, unbalanced muscle activation and decreased coordination and decreased  standing balance, decreased postural control, hemiplegia and decreased balance strategies.  Prior to hospitalization, patient could complete ADLs with independent .  Patient will benefit from skilled intervention to decrease level of assist with basic self-care skills, increase independence with basic self-care skills and increase level of  independence with iADL prior to discharge home with care partner.  Anticipate patient will require 24 hour supervision and follow up outpatient.  OT - End of Session Activity Tolerance: Decreased this session Endurance Deficit: Yes Endurance Deficit Description: dyspnea 2/4 during selfcare tasks OT Assessment Rehab Potential (ACUTE ONLY): Excellent Barriers to Discharge: Decreased caregiver support Barriers to Discharge Comments: husband works will need to arrange initial 24 hour supervision OT Patient demonstrates impairments in the following area(s): Balance;Cognition;Endurance;Motor;Sensory OT Basic ADL's Functional Problem(s): Eating;Grooming;Bathing;Dressing;Toileting OT Advanced ADL's Functional Problem(s): Light Housekeeping;Simple Meal Preparation OT Transfers Functional Problem(s): Toilet;Tub/Shower OT Additional Impairment(s): Fuctional Use of Upper Extremity OT Plan OT Intensity: Minimum of 1-2 x/day, 45 to 90 minutes OT Frequency: 5 out of 7 days OT Duration/Estimated Length of Stay: 12-14 days OT Treatment/Interventions: Balance/vestibular training;Cognitive remediation/compensation;DME/adaptive equipment instruction;Disease mangement/prevention;Neuromuscular re-education;Self Care/advanced ADL retraining;UE/LE Strength taining/ROM;Patient/family education;Community reintegration;Functional electrical stimulation;Discharge planning;Functional mobility training;Psychosocial support;Therapeutic Activities;UE/LE Coordination activities;Splinting/orthotics;Therapeutic Exercise OT Self Feeding Anticipated Outcome(s): modified independent OT Basic Self-Care Anticipated Outcome(s): supervision to modified independent OT Toileting Anticipated Outcome(s): supervision OT Bathroom Transfers Anticipated Outcome(s): supervision OT Recommendation Recommendations for Other Services: Neuropsych consult Patient destination: Home Follow Up Recommendations: Outpatient OT Equipment Recommended:  To be determined   Skilled Therapeutic Intervention Pt began working on selfcare re-training EOB.  Oxygen sats maintained at 94% at rest, decreasing to 91% during bathing, with HR increasing to the lower 100s.  Dyspnea noted at 2/4 during most of session during exertion.  Sit to stand and functional transfers completed mod assist level with hand held assist.  Pt needing mod assist to integrate the RUE into basic selfcare tasks at this point.  Pt left in wheelchair with call button in place.  Educated her to work on shoulder movements using folded up towel on 1/2 lap tray for homework.  Discussed expected goals and ELOS.    OT Evaluation Precautions/Restrictions  Precautions Precautions: Fall Precaution Comments: R hemiplegia UE more impaired than LE Restrictions Weight Bearing Restrictions: No  Vital Signs Therapy Vitals Pulse Rate: 100 BP: 98/60 mmHg Patient Position (if appropriate): Sitting Pain Pain Assessment Pain Assessment: No/denies pain Home Living/Prior Functioning Home Living Available Help at Discharge: Family, Friend(s), Available 24 hours/day Type of Home: House  Lives With: Spouse Prior Function Vocation: Full time employment Youth worker ) ADL  See Function Section of chart for details Vision/Perception  Vision- History Baseline Vision/History: Wears glasses Wears Glasses: Reading only;Distance only (Only when needed) Patient Visual Report: No change from baseline Vision- Assessment Vision Assessment?: No apparent visual deficits  Cognition Overall Cognitive Status: Impaired/Different from baseline Arousal/Alertness: Awake/alert Orientation Level: Person;Place;Situation Person: Oriented Place: Oriented Situation: Oriented Year: 2017 Month: March Day of Week: Correct Memory: Impaired Memory Impairment: Prospective memory Immediate Memory Recall: Sock;Blue;Bed Memory Recall: Sock;Blue;Bed Memory Recall Sock: Without Cue Memory Recall Blue:  Without Cue Memory Recall Bed: Without Cue Attention: Selective Selective Attention: Appears intact Awareness: Appears intact Problem Solving: Impaired Problem Solving Impairment: Functional complex Safety/Judgment: Appears intact Comments: Will continue to assess cognition in function. Sensation Sensation Light Touch: Appears Intact Stereognosis: Not tested Hot/Cold: Not tested Proprioception: Impaired Detail Proprioception Impaired Details: Impaired RUE Additional Comments: Proprioception slighly impaired in the right hand and digits. Coordination Gross  Motor Movements are Fluid and Coordinated: No Fine Motor Movements are Fluid and Coordinated: No Coordination and Movement Description: Pt needs mod hand over hand assist to integrate into bathing tasks.  She does not attempt spontaneous use in therapy.   Motor  Motor Motor: Hemiplegia Motor - Skilled Clinical Observations: Moderate RUE hemiparesis, with mild right lower hemiparesis Mobility  Bed Mobility Bed Mobility: Supine to Sit Supine to Sit: 4: Min assist;HOB flat Supine to Sit Details: Manual facilitation for weight shifting Transfers Transfers: Sit to Stand;Stand to Sit Sit to Stand: 3: Mod assist;With upper extremity assist;From bed Sit to Stand Details: Verbal cues for technique;Manual facilitation for weight shifting;Verbal cues for sequencing Stand to Sit: 3: Mod assist;To bed;To chair/3-in-1;With upper extremity assist Stand to Sit Details (indicate cue type and reason): Verbal cues for sequencing;Verbal cues for technique;Manual facilitation for weight shifting  Trunk/Postural Assessment  Cervical Assessment Cervical Assessment: Exceptions to [facility] (foward cervical protraction and slight flexion noted) Thoracic Assessment Thoracic Assessment: Within Functional Limits Lumbar Assessment Lumbar Assessment: Within Functional Limits Postural Control Postural Control: Deficits on evaluation Postural Limitations:  Maintains posterior pelvic tilt in sitting.   Balance Balance Balance Assessed: Yes Static Sitting Balance Static Sitting - Balance Support: No upper extremity supported Static Sitting - Level of Assistance: 6: Modified independent (Device/Increase time) Dynamic Sitting Balance Dynamic Sitting - Balance Support: No upper extremity supported Dynamic Sitting - Level of Assistance: 5: Stand by assistance Static Standing Balance Static Standing - Balance Support: No upper extremity supported Static Standing - Level of Assistance: 4: Min assist Dynamic Standing Balance Dynamic Standing - Level of Assistance: 3: Mod assist;2: Max assist;Other (comment) (during functional mobility and transfers) Extremity/Trunk Assessment RUE Assessment RUE Assessment: Exceptions to [facility] RUE Strength RUE Overall Strength Comments: Pt demonstrates Brunnstrum stage III movement in the right arm and hand.  AAROM WFLS for all joints but needs mod assist for integration into simple bathing tasks.   LUE Assessment LUE Assessment: Within Functional Limits   See Function Navigator for Current Functional Status.   Refer to Care Plan for Long Term Goals  Recommendations for other services: Neuropsych  Discharge Criteria: Patient will be discharged from OT if patient refuses treatment 3 consecutive times without medical reason, if treatment goals not met, if there is a change in medical status, if patient makes no progress towards goals or if patient is discharged from hospital.  The above assessment, treatment plan, treatment alternatives and goals were discussed and mutually agreed upon: by patient and by family  Zayra Devito OTR/L 06/17/2015, 12:58 PM

## 2015-06-17 NOTE — Progress Notes (Signed)
Inpatient [facility] Individual Statement of Services  Patient Name:  [name] [name]  Date:  06/17/2015  Welcome to the [facility].  Our goal is to provide you with an individualized program based on your diagnosis and situation, designed to meet your specific needs.  With this comprehensive rehabilitation program, you will be expected to participate in at least 3 hours of rehabilitation therapies Monday-Friday, with modified therapy programming on the weekends.  Your rehabilitation program will include the following services:  Physical Therapy (PT), Occupational Therapy (OT), Speech Therapy (ST), 24 hour per day rehabilitation nursing, Neuropsychology, Case Management (Social Worker), Rehabilitation Medicine, Nutrition Services and Pharmacy Services  Weekly team conferences will be held on Wednesdays to discuss your progress.  Your Social Worker will talk with you frequently to get your input and to update you on team discussions.  Team conferences with you and your family in attendance may also be held.  Expected length of stay: 10 to 14 days  Overall anticipated outcome: supervision with minimal assistance for bathing  Depending on your progress and recovery, your program may change. Your Social Worker will coordinate services and will keep you informed of any changes. Your Social Worker's name and contact numbers are listed  below.  The following services may also be recommended but are not provided by the [facility] will be made to provide these services after discharge if needed.  Arrangements include referral to agencies that provide these services.  Your insurance has been verified to be:  [place] Your primary doctor is:  Dr. Derinda Late  Pertinent information will  be shared with your doctor and your insurance company.  Social Worker:  Alfonse Alpers, LCSW  [phone] or [phone]  Information discussed with and copy given to patient by: Trey Sailors, 06/17/2015, 2:29 PM

## 2015-06-17 NOTE — Evaluation (Signed)
Physical Therapy Assessment and Plan  Patient Details  Name: [name] [name] MRN: [id number] Date of Birth: 11-21-1955  PT Diagnosis: Abnormality of gait and Hemiparesis dominant Rehab Potential: Good ELOS: 10-14 days   Today's Date: 06/17/2015 PT Individual Time: 1300-1430 PT Individual Time Calculation (min): 90 min    Problem List:  Patient Active Problem List   Diagnosis Date Noted  . Ischemic stroke of frontal lobe ([place]) 06/16/2015  . Dysphagia as late effect of cerebrovascular disease   . Hemiparesis affecting dominant side as late effect of stroke ([place])   . Esophageal stricture   . Bacteremia   . Atrial flutter ([place])   . Rheumatoid arthritis ([place])   . Hyperglycemia   . Thyroid activity decreased   . Infection due to Streptococcus pyogenes 06/12/2015  . Severe sepsis with septic shock ([place]) 06/12/2015  . Hematuria 06/12/2015  . Dysphagia 06/12/2015  . 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 CVA (cerebral infarction)   . Acute respiratory failure ([place])   . Encounter for diagnostic procedure   . Ventilator dependence ([place])   . Carotid thromboses   . HLD (hyperlipidemia)   . Respiratory failure ([place])   . Stroke (cerebrum) ([place])   . Research study patient   . Endotracheal tube present   . Endotracheally intubated   . Encounter for feeding tube placement   . Altered mental state   . Weakness   . Sepsis ([place]) 05/30/2015  . CAP (community acquired pneumonia)   . Encephalopathy   . Encounter for central line placement   . Hypoxia   . Lactic acidosis     Past Medical History:  Past Medical History  Diagnosis Date  . Hypothyroidism   . Seizures ([place])   . Adenopathy   . Scoliosis   . Spleen anomaly   . Anemia   . Sjogren's disease ([place])   . Raynaud disease   . Shingles   . Osteopenia    Past Surgical History:  Past Surgical History  Procedure Laterality Date  . Tonsillectomy    . Breast cyst aspiration Right 1990's    neg    Assessment & Plan Clinical  Impression: [name] is a 60 y/o woman with a history of Sjogren's disease, hypothyroidism and dysphagia. She was diagnosed with the flu (A) about 4 days ago and started on tamiflu. According to her husband, she had actually been improving the 24 hours prior to admission with fever resolving and improvement in her nausea and vomiting. He reports that the evening of admission she developed weakness and altered mental status. She was brought to [facility] ED via ambulance. She was initially thought to be a code stroke with a right sided facial droop and flaccid paralysis of her right arm. Of note she does have a history of seizure but is not on antiepileptics and had no witnessed seizure activity. In the ED she was found to be in respiratory distress and acutely hypoxic on a non-rebreather. A chest x-ray showed a dense right sided infiltrate with volume loss. She was also noted to have a diffuse red papular rash. She is on plaquenil for her Sjogren's disease but is not on any other immunosuppressive agents. 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   Patient transferred to CIR on 06/16/2015 .   Patient currently requires mod with mobility secondary to muscle weakness, decreased cardiorespiratoy endurance, impaired timing and sequencing and decreased attention to right.  Prior to hospitalization, patient was independent  with  mobility and lived with Spouse in a House home.  Home access is 3Stairs to enter.  Patient will benefit from skilled PT intervention to maximize safe functional mobility, minimize fall risk and decrease caregiver burden for planned discharge home with 24 hour supervision.  Anticipate patient will benefit from follow up OP at discharge.  PT - End of Session Activity Tolerance: Tolerates < 10 min activity with changes in vital signs Endurance Deficit: Yes Endurance Deficit Description: dyspnea 2/4 during selfcare tasks, HR up to 125 following brief walk PT  Assessment Rehab Potential (ACUTE/IP ONLY): Good PT Patient demonstrates impairments in the following area(s): Balance;Endurance;Motor;Nutrition;Perception;Safety PT Transfers Functional Problem(s): Bed Mobility;Bed to Chair;Car;Furniture PT Locomotion Functional Problem(s): Ambulation;Wheelchair Mobility;Stairs PT Plan PT Intensity: Minimum of 1-2 x/day ,45 to 90 minutes PT Frequency: 5 out of 7 days PT Duration Estimated Length of Stay: 10-14 days PT Treatment/Interventions: Ambulation/gait training;Balance/vestibular training;Community reintegration;Cognitive remediation/compensation;Discharge planning;Disease management/prevention;DME/adaptive equipment instruction;Functional mobility training;Neuromuscular re-education;Pain management;Patient/family education;Psychosocial support;Skin care/wound management;Splinting/orthotics;Stair training;Therapeutic Activities;Therapeutic Exercise;UE/LE Strength taining/ROM;UE/LE Coordination activities;Wheelchair propulsion/positioning PT Transfers Anticipated Outcome(s): Supervision PT Locomotion Anticipated Outcome(s): Supervision PT Recommendation Recommendations for Other Services: Neuropsych consult Follow Up Recommendations: 24 hour supervision/assistance;Outpatient PT Patient destination: Home Equipment Recommended: Rolling walker with 5" wheels;To be determined Equipment Details: May need WC as well  Skilled Therapeutic Intervention Pt tx initiated upon eval. Pt oriented to unit, call bell, PT POC, and principles of rehab and recovery. HR >100bpm throughout tx, increasing to 125 immediately following activity. 02 >90%. Pt was educated on fall risk reduction and precautions. Pt instructed in transfer training for basic stand pivot transfer WC<>toilet<>car>bed, as well as, sit<>stands with forced use of RLE, sitting balance, standing balance training, stair training x8, and gait with/without RW. Pt required up to Mod A for lifting and steadying  during all mobility. Pt instructed in incentive spirometry, seated therex for LAQ with 5 sec hold on R as well as marching bil x20. PT provided demonstration and instruction on RW with hand splint, walking x45' with Min A and cues for heel strike, step width, and posture. Pt educated on importance of OOB activity as tolerable throughout the day. Pt left in bed to rest, all needs in reach.   PT Evaluation Precautions/Restrictions Precautions Precautions: Fall Precaution Comments: R hemiplegia UE more impaired than LE Restrictions Weight Bearing Restrictions: No General   Vital SignsTherapy Vitals Pulse Rate: (!) 103 BP: (!) 102/58 mmHg Patient Position (if appropriate): Sitting Oxygen Therapy SpO2: 97 % O2 Device: Not Delivered Pain Pain Assessment Pain Assessment: No/denies pain Home Living/Prior Functioning Home Living Available Help at Discharge: Family;Friend(s);Available 24 hours/day Type of Home: House Home Access: Stairs to enter [employer] of Steps: 3 Entrance Stairs-Rails: None (But having rail built) Home Layout: Two level;Able to live on main level with bedroom/bathroom;Full bath on main level Bathroom Shower/Tub: Walk-in shower;Tub/shower unit Bathroom Toilet: Standard Additional Comments: Pt's husband is a Theme park manager and states he has tremendous flexibility in his schedule.  I explained to him that pt. will need 24 hour assist for an unknown time.  He states he has a strong support network and that he will definitely be able to arrange 24 hour care  Lives With: Spouse Prior Function Level of Independence: Independent with basic ADLs;Independent with gait;Independent with transfers  Able to Take Stairs?: Yes Driving: Yes Vocation: Full time employment Youth worker ) Vision/Perception  Vision - History Baseline Vision: Other (comment) (Glasses for driving and movies) Patient Visual Report: No change from baseline Vision - Assessment Eye  Alignment:  Within Functional Limits Perception Perception: Impaired Inattention/Neglect: Does not attend to right side of body Praxis Praxis: Intact  Cognition Overall Cognitive Status: Impaired/Different from baseline Arousal/Alertness: Awake/alert Orientation Level: Oriented X4;Oriented to person;Oriented to place;Oriented to time;Oriented to situation Attention: Selective Selective Attention: Appears intact Memory: Impaired Memory Impairment: Prospective memory Awareness: Appears intact Problem Solving: Impaired Problem Solving Impairment: Functional complex Safety/Judgment: Appears intact Comments: Will continue to assess cognition in function. Sensation Sensation Light Touch: Appears Intact Stereognosis: Not tested Hot/Cold: Not tested Proprioception: Impaired Detail Proprioception Impaired Details: Impaired RUE Additional Comments: Proprioception slighly impaired in the right hand and digits. Coordination Gross Motor Movements are Fluid and Coordinated: No Fine Motor Movements are Fluid and Coordinated: No Coordination and Movement Description: Pt needs mod hand over hand assist to integrate into functional tasks. She does not attempt spontaneous use in therapy.   Heel Shin Test: Impaired excursion and accuracy Motor  Motor Motor: Hemiplegia Motor - Skilled Clinical Observations: Moderate RUE hemiparesis, with mild right lower hemiparesis  Mobility Bed Mobility Bed Mobility: Supine to Sit Supine to Sit: 4: Min assist;HOB flat Supine to Sit Details: Manual facilitation for weight shifting Sit to Supine: 4: Min assist Sit to Supine - Details: Manual facilitation for weight shifting;Manual facilitation for placement Transfers Sit to Stand: 3: Mod assist;With upper extremity assist;From bed Sit to Stand Details: Verbal cues for technique;Manual facilitation for weight shifting;Verbal cues for sequencing Stand to Sit: 3: Mod assist;To bed;To chair/3-in-1;With upper extremity  assist Stand to Sit Details (indicate cue type and reason): Verbal cues for sequencing;Verbal cues for technique;Manual facilitation for weight shifting Stand Pivot Transfers: 3: Mod assist Stand Pivot Transfer Details: Verbal cues for technique;Verbal cues for sequencing;Manual facilitation for weight shifting;Manual facilitation for placement Stand Pivot Transfer Details (indicate cue type and reason): Cues for hand placement, anterior translation, and safety Locomotion  Ambulation Ambulation/Gait Assistance: 3: Mod assist Ambulation Distance (Feet): 10 Feet Assistive device: 1 person hand held assist Ambulation/Gait Assistance Details: Cues for psoture, heel strike, and full hip/trunk ext in stance Stairs / Additional Locomotion Stairs: Yes Stairs Assistance: 3: Mod assist Stairs Assistance Details: Verbal cues for sequencing;Verbal cues for technique;Verbal cues for precautions/safety;Manual facilitation for weight shifting Stair Management Technique: One rail Left;Step to pattern;Forwards Number of Stairs: 8 Height of Stairs: 6 Wheelchair Mobility Wheelchair Mobility: Yes Wheelchair Assistance: 4: Energy manager: Both lower extermities;Left upper extremity Wheelchair Parts Management: Needs assistance Distance: 75  Trunk/Postural Assessment  Cervical Assessment Cervical Assessment: Exceptions to [facility] (foward cervical protraction and slight flexion noted) Thoracic Assessment Thoracic Assessment: Exceptions to [facility] (Mild kyphosis) Lumbar Assessment Lumbar Assessment: Within Functional Limits Postural Control Postural Control: Deficits on evaluation Postural Limitations: Maintains posterior pelvic tilt in sitting.   Balance Balance Balance Assessed: Yes Static Sitting Balance Static Sitting - Balance Support: No upper extremity supported Static Sitting - Level of Assistance: 6: Modified independent (Device/Increase time) Dynamic Sitting Balance Dynamic  Sitting - Balance Support: No upper extremity supported Dynamic Sitting - Level of Assistance: 5: Stand by assistance Static Standing Balance Static Standing - Balance Support: No upper extremity supported Static Standing - Level of Assistance: 4: Min assist Dynamic Standing Balance Dynamic Standing - Level of Assistance: 3: Mod assist;2: Max assist;Other (comment) Extremity Assessment  RUE Assessment RUE Assessment: Exceptions to [facility] RUE Strength RUE Overall Strength Comments: Pt demonstrates Brunnstrum stage III movement in the right arm and hand.  AAROM WFLS for all joints but needs mod assist for integration into simple  bathing tasks.   LUE Assessment LUE Assessment: Within Functional Limits RLE Assessment RLE Assessment: Exceptions to [facility] RLE Strength RLE Overall Strength Comments: 3/5 throughout LLE Assessment LLE Assessment: Exceptions to [facility] LLE Strength LLE Overall Strength Comments: 4/5 throughout except 3/5 ankle DF   See Function Navigator for Current Functional Status.   Refer to Care Plan for Long Term Goals  Recommendations for other services: Neuropsych  Discharge Criteria: Patient will be discharged from PT if patient refuses treatment 3 consecutive times without medical reason, if treatment goals not met, if there is a change in medical status, if patient makes no progress towards goals or if patient is discharged from hospital.  The above assessment, treatment plan, treatment alternatives and goals were discussed and mutually agreed upon: by patient   Kennieth Rad, PT, DPT  06/17/2015, 2:51 PM

## 2015-06-18 ENCOUNTER — Inpatient Hospital Stay (HOSPITAL_COMMUNITY): Payer: BC Managed Care – PPO | Admitting: Physical Therapy

## 2015-06-18 ENCOUNTER — Inpatient Hospital Stay (HOSPITAL_COMMUNITY): Payer: BC Managed Care – PPO | Admitting: Speech Pathology

## 2015-06-18 ENCOUNTER — Inpatient Hospital Stay (HOSPITAL_COMMUNITY): Payer: BC Managed Care – PPO | Admitting: Occupational Therapy

## 2015-06-18 LAB — GLUCOSE, CAPILLARY
GLUCOSE-CAPILLARY: 130 mg/dL — AB (ref 65–99)
GLUCOSE-CAPILLARY: 132 mg/dL — AB (ref 65–99)
GLUCOSE-CAPILLARY: 155 mg/dL — AB (ref 65–99)
GLUCOSE-CAPILLARY: 156 mg/dL — AB (ref 65–99)
Glucose-Capillary: 150 mg/dL — ABNORMAL HIGH (ref 65–99)
Glucose-Capillary: 161 mg/dL — ABNORMAL HIGH (ref 65–99)
Glucose-Capillary: 173 mg/dL — ABNORMAL HIGH (ref 65–99)

## 2015-06-18 MED ORDER — ATORVASTATIN CALCIUM 20 MG PO TABS
20.0000 mg | ORAL_TABLET | Freq: Every day | ORAL | Status: DC
  Administered 2015-06-18 – 2015-06-29 (×12): 20 mg
  Filled 2015-06-18 (×12): qty 1

## 2015-06-18 NOTE — Progress Notes (Signed)
Physical Therapy Session Note  Patient Details  Name: [name] [name] MRN: [id number] Date of Birth: 09/25/1955  Today's Date: 06/18/2015 PT Individual Time: 1400-1430 PT Individual Time Calculation (min): 30 min   Short Term Goals: Week 1:  PT Short Term Goal 1 (Week 1): Pt will perform all bed mobiltiy with S, including UE management PT Short Term Goal 2 (Week 1): Pt will perform WC<>bed transfers with Min A consistently PT Short Term Goal 3 (Week 1): Pt will ambulate x50' with RW and Min A PT Short Term Goal 4 (Week 1): Pt will perform 12 stairs with Min A  PT Short Term Goal 5 (Week 1): Pt will demonstrate static standing balance x5 min for functional tasks with close S  Skilled Therapeutic Interventions/Progress Updates:    Pt received in bed, agreeable to PT & reporting need to go to bathroom. Pt transferred supine>sit with bed rails & HOB raised to 30 degrees. PT provided setup of w/c & pt transferred bed>w/c via stand pivot & CGA. Pt appeared to be very anxious with activity & PT cued pt to take deep breaths & provided calming instruction.  PT transferred pt into bathroom via w/c total A 2/2 urgency. Pt was able to transfer w/c>toilet with CGA & cuing for sequencing. While standing pt was able to doff pants & underwear with CGA. Pt (+) BM & void during toileting & was able to perform cleaning. PT provided follow up cleaning care 2/2 pt request & Mod A to don underwear & pants.  Pt transferred toilet>w/c in same fashion as noted above. Pt performed hand hygiene at w/c level & then requested to go back to bed. Pt transferred w/c>bed with CGA & sit>supine with supervision. PT blocked pt's BLE to allow pt to bridge & scoot up in bed but pt required total A to completely scoot to head of bed 2/2 bed restricted to 30 degrees. At end of session pt was left in bed with all needs met & RN present. Pt's HR ranged from 84-130 bpm throughout tx.  Therapy Documentation Precautions:   Precautions Precautions: Fall Precaution Comments: R hemiplegia UE more impaired than LE Restrictions Weight Bearing Restrictions: No  Pain: Pain Assessment Pain Assessment: No/denies pain   See Function Navigator for Current Functional Status.   Therapy/Group: Individual Therapy  Victoria M Miller 06/18/2015, 3:47 PM  

## 2015-06-18 NOTE — Progress Notes (Signed)
60 y.o.right handed female with history of Sjogrens disease/rheumatoid arthritis maintained on plaquenil and Prednisone, noted history of seizure but not on antiepileptic meds and dysphagia. Patient diagnosed 4 days prior withFlu and placed on Tamiflu. Lives with spouse independent prior to admission. 2 level home with bedroom on main level. 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 Presented 05/29/2015 with right side weakness and gait instability. Patient with severe restrictive airway distress when he presented to the ED with oxygen saturations in the 70s requiring nonrebreathing mask and later intubated with slow extubation follow-up per critical care pulmonary services. Chest x-ray showed large right lung consolidative changes. CT angiogram chest no evidence of pulmonary emboli. Cranial CT scan negative. 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 Echocardiogram with ejection fraction of 65% no wall motion abnormalities. 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 EEG was negative for seizure. Patient did not receive TPA. Blood cultures showed group A strep pansensitive 05/30/2015 and currently maintained on Rocephin with droplet contact precautions with question duration of antibiotics. Hospital course atrial flutter currently maintained Lovenox 40 mg daily and asymptomatic with low dose beta blocker added. Modified barium swallow 06/15/2015 shows moderate pharyngeal dysphagia as well as esophageal stricture. Patient underwent gastrostomy tube per interventional radiology 06/14/2015. Currently maintained on Lovenox for DVT prophylaxis. Plan is to begin aspirin/ Plavix therapy for CVA prophylaxis after PEG tube feeds initiated  Subjective/Complaints: Discussed right upper extremity status with OT. Patient is working on her a.m. ADLs. No shoulder pain on the right side. Denies any abdominal pain. No nausea or vomiting  Review  of systems negative for chest pain, shortness of breath, nausea vomiting diarrhea constipation Objective: Vital Signs: Blood pressure 101/55, pulse 82, temperature 98.5 F (36.9 C), temperature source Oral, resp. rate 18, height 5' 1"  (1.549 m), weight 55.293 kg (121 lb 14.4 oz), SpO2 97 %. No results found. Results for orders placed or performed during the hospital encounter of 06/16/15 (from the past 72 hour(s))  CBC     Status: Abnormal   Collection Time: 06/16/15  7:40 PM  Result Value Ref Range   WBC 5.8 4.0 - 10.5 K/uL   RBC 3.31 (L) 3.87 - 5.11 MIL/uL   Hemoglobin 9.4 (L) 12.0 - 15.0 g/dL   HCT 30.8 (L) 36.0 - 46.0 %   MCV 93.1 78.0 - 100.0 fL   MCH 28.4 26.0 - 34.0 pg   MCHC 30.5 30.0 - 36.0 g/dL   RDW 19.9 (H) 11.5 - 15.5 %   Platelets 707 (H) 150 - 400 K/uL  Creatinine, serum     Status: None   Collection Time: 06/16/15  7:40 PM  Result Value Ref Range   Creatinine, Ser 0.51 0.44 - 1.00 mg/dL   GFR calc non Af Amer >60 >60 mL/min   GFR calc Af Amer >60 >60 mL/min    Comment: (NOTE) The eGFR has been calculated using the CKD EPI equation. This calculation has not been validated in all clinical situations. eGFR's persistently <60 mL/min signify possible Chronic Kidney Disease.   Glucose, capillary     Status: Abnormal   Collection Time: 06/16/15  8:18 PM  Result Value Ref Range   Glucose-Capillary 147 (H) 65 - 99 mg/dL  Glucose, capillary     Status: Abnormal   Collection Time: 06/17/15 12:12 AM  Result Value Ref Range   Glucose-Capillary 122 (H) 65 - 99 mg/dL  Glucose, capillary  Status: Abnormal   Collection Time: 06/17/15  4:02 AM  Result Value Ref Range   Glucose-Capillary 135 (H) 65 - 99 mg/dL  CBC WITH DIFFERENTIAL     Status: Abnormal   Collection Time: 06/17/15  5:47 AM  Result Value Ref Range   WBC 5.9 4.0 - 10.5 K/uL   RBC 3.65 (L) 3.87 - 5.11 MIL/uL   Hemoglobin 10.2 (L) 12.0 - 15.0 g/dL   HCT 34.4 (L) 36.0 - 46.0 %   MCV 94.2 78.0 - 100.0 fL    MCH 27.9 26.0 - 34.0 pg   MCHC 29.7 (L) 30.0 - 36.0 g/dL   RDW 20.2 (H) 11.5 - 15.5 %   Platelets 723 (H) 150 - 400 K/uL   Neutrophils Relative % 55 %   Neutro Abs 3.3 1.7 - 7.7 K/uL   Lymphocytes Relative 24 %   Lymphs Abs 1.4 0.7 - 4.0 K/uL   Monocytes Relative 19 %   Monocytes Absolute 1.1 (H) 0.1 - 1.0 K/uL   Eosinophils Relative 1 %   Eosinophils Absolute 0.1 0.0 - 0.7 K/uL   Basophils Relative 1 %   Basophils Absolute 0.0 0.0 - 0.1 K/uL  Comprehensive metabolic panel     Status: Abnormal   Collection Time: 06/17/15  5:47 AM  Result Value Ref Range   Sodium 134 (L) 135 - 145 mmol/L   Potassium 4.1 3.5 - 5.1 mmol/L   Chloride 100 (L) 101 - 111 mmol/L   CO2 26 22 - 32 mmol/L   Glucose, Bld 149 (H) 65 - 99 mg/dL   BUN 13 6 - 20 mg/dL   Creatinine, Ser 0.57 0.44 - 1.00 mg/dL   Calcium 8.9 8.9 - 10.3 mg/dL   Total Protein 6.9 6.5 - 8.1 g/dL   Albumin 2.2 (L) 3.5 - 5.0 g/dL   AST 84 (H) 15 - 41 U/L   ALT 63 (H) 14 - 54 U/L   Alkaline Phosphatase 427 (H) 38 - 126 U/L   Total Bilirubin 0.3 0.3 - 1.2 mg/dL   GFR calc non Af Amer >60 >60 mL/min   GFR calc Af Amer >60 >60 mL/min    Comment: (NOTE) The eGFR has been calculated using the CKD EPI equation. This calculation has not been validated in all clinical situations. eGFR's persistently <60 mL/min signify possible Chronic Kidney Disease.    Anion gap 8 5 - 15  Glucose, capillary     Status: Abnormal   Collection Time: 06/17/15  8:42 AM  Result Value Ref Range   Glucose-Capillary 159 (H) 65 - 99 mg/dL  Glucose, capillary     Status: Abnormal   Collection Time: 06/17/15 11:24 AM  Result Value Ref Range   Glucose-Capillary 131 (H) 65 - 99 mg/dL  Glucose, capillary     Status: Abnormal   Collection Time: 06/17/15  3:50 PM  Result Value Ref Range   Glucose-Capillary 199 (H) 65 - 99 mg/dL  Glucose, capillary     Status: Abnormal   Collection Time: 06/17/15  8:38 PM  Result Value Ref Range   Glucose-Capillary 203 (H) 65 -  99 mg/dL  Glucose, capillary     Status: Abnormal   Collection Time: 06/18/15 12:05 AM  Result Value Ref Range   Glucose-Capillary 161 (H) 65 - 99 mg/dL  Glucose, capillary     Status: Abnormal   Collection Time: 06/18/15  4:00 AM  Result Value Ref Range   Glucose-Capillary 156 (H) 65 - 99 mg/dL  Glucose, capillary  Status: Abnormal   Collection Time: 06/18/15  8:30 AM  Result Value Ref Range   Glucose-Capillary 150 (H) 65 - 99 mg/dL      General: No acute distress Mood and affect are appropriate Heart: Regular rate and rhythm no rubs murmurs or extra sounds Lungs: Clear to auscultation, breathing unlabored, no rales or wheezes, decreased breath sounds at bases Abdomen: Positive bowel sounds, soft nontender to palpation, nondistended Extremities: No clubbing, cyanosis, or edema Skin: No evidence of breakdown, no evidence of rash Neurologic: Cranial nerves II through XII intact, motor strength is 5/5 in left deltoid, bicep, tricep, grip, hip flexor, knee extensors, ankle dorsiflexor and plantar flexor, 3 minus right deltoid, bicep, tricep, grip,, 4 minus right hip flexor and knee extensor and 2 minus ankle dorsiflexor Sensory exam normal sensation to light touch in bilateral upper and lower extremities  Musculoskeletal: Full passive range  of motion in all 4 extremities. No joint swelling   Assessment/Plan: 1. Functional deficits secondary to Right side weakness/dysphagia secondary to left frontal lobe infarct complicated by respiratory failure from septic shock. which require 3+ hours per day of interdisciplinary therapy in a comprehensive inpatient rehab setting. Physiatrist is providing close team supervision and 24 hour management of active medical problems listed below. Physiatrist and rehab team continue to assess barriers to discharge/monitor patient progress toward functional and medical goals. FIM: Function - Bathing Position: Sitting EOB Body parts bathed by patient:  Right arm, Chest, Abdomen, Right upper leg, Left upper leg, Right lower leg, Left lower leg Body parts bathed by helper: Back, Buttocks, Front perineal area, Left arm  Function- Upper Body Dressing/Undressing What is the patient wearing?: Pull over shirt/dress Pull over shirt/dress - Perfomed by patient: Thread/unthread left sleeve, Put head through opening Pull over shirt/dress - Perfomed by helper: Pull shirt over trunk, Thread/unthread right sleeve Function - Lower Body Dressing/Undressing What is the patient wearing?: Underwear, Pants, Shoes, Ted Hose Position: Sitting EOB Underwear - Performed by patient: Thread/unthread right underwear leg, Thread/unthread left underwear leg Underwear - Performed by helper: Pull underwear up/down Pants- Performed by patient: Thread/unthread right pants leg, Thread/unthread left pants leg Shoes - Performed by helper: Don/doff right shoe, Don/doff left shoe, Fasten right, Fasten left TED Hose - Performed by helper: Don/doff right TED hose, Don/doff left TED hose  Function - Toileting Toileting steps completed by helper: Adjust clothing prior to toileting, Performs perineal hygiene, Adjust clothing after toileting Toileting Assistive Devices: Grab bar or rail Assist level: Touching or steadying assistance (Pt.75%)  Function - Air cabin crew transfer assistive device: Grab bar Assist level to toilet: Moderate assist (Pt 50 - 74%/lift or lower) Assist level from toilet: Moderate assist (Pt 50 - 74%/lift or lower)  Function - Chair/bed transfer Chair/bed transfer method: Stand pivot Chair/bed transfer assist level: Moderate assist (Pt 50 - 74%/lift or lower) Chair/bed transfer assistive device: Armrests Chair/bed transfer details: Visual cues for safe use of DME/AE, Verbal cues for sequencing, Verbal cues for technique, Verbal cues for precautions/safety, Verbal cues for safe use of DME/AE, Manual facilitation for weight shifting, Manual  facilitation for placement  Function - Locomotion: Wheelchair Will patient use wheelchair at discharge?: Yes Type: Manual Max wheelchair distance: 75 Assist Level: Touching or steadying assistance (Pt > 75%) Assist Level: Touching or steadying assistance (Pt > 75%) Wheel 150 feet activity did not occur: Safety/medical concerns Turns around,maneuvers to table,bed, and toilet,negotiates 3% grade,maneuvers on rugs and over doorsills: No Function - Locomotion: Ambulation Assistive device: Walker-rolling Max distance:  45 Assist level: Moderate assist (Pt 50 - 74%) Assist level: Touching or steadying assistance (Pt > 75%) Walk 50 feet with 2 turns activity did not occur: Safety/medical concerns Walk 150 feet activity did not occur: Safety/medical concerns Walk 10 feet on uneven surfaces activity did not occur: Safety/medical concerns  Function - Comprehension Comprehension: Auditory Comprehension assist level: Follows basic conversation/direction with no assist  Function - Expression Expression: Verbal Expression assist level: Expresses basic needs/ideas: With no assist  Function - Social Interaction Social Interaction assist level: Interacts appropriately 90% of the time - Needs monitoring or encouragement for participation or interaction.  Function - Problem Solving Problem solving assist level: Solves basic 90% of the time/requires cueing < 10% of the time  Function - Memory Memory assist level: Recognizes or recalls 90% of the time/requires cueing < 10% of the time Patient normally able to recall (first 3 days only): Current season, Location of own room, Staff names and faces, That he or she is in a hospital  Medical Problem List and Plan: 1.  Right side weakness/dysphagia secondary to left frontal lobe infarct complicated by respiratory failure.participating well with OT this morning2.  DVT Prophylaxis/Anticoagulation: SCDs. Monitor for any signs of DVT 3. Pain Management:  Tylenol as needed 4. Dysphagia/esophageal stricture. Status post gastrostomy tube 06/14/2015 per interventional radiology. Speech therapy follow-up. Plan outpatient EGD endoscopy for dilatation of stricture, swallowing dysfunction improving when comparing the modified barium swallow performed 06/09/2015 versus 06/15/2015 5. Neuropsych: This patient is capable of making decisions on her own behalf. 6. Skin/Wound Care: Routine skin checks, gastrostomy tube site looks okay except has some dried blood. Will write for daily normal saline rinses 7. Fluids/Electrolytes/Nutrition: Routine I&O's with follow-up chemistries 8. ID. Strep pyogenes bacteremia. Followed by infectious disease. Continue ceftriaxone questions 6 week course and confirm with Dr. Drucilla Schmidt. Maintain contact precautions 9. Atrial flutter with RVR. Lopressor 12.5 mg twice a day 10. Rheumatoid arthritis/Sjorgens disease. Discuss resuming plaquenil and chronic prednisone.no active joint inflammation at the current time 11. Hyperglycemia secondary to chronic prednisone/tube feeds. Hemoglobin A1c 6.6. Check blood sugars while on tube feeds 12. Hyperlipidemia. Lipitor, patient has elevated liver function tests, we'll reduce Lipitor from 40 mg to 20 mg and recheck C met  in 1-2 weeks 13. Hypothyroidism. Resume Synthroid 14. Elevated LFTs, had recent gallbladder ultrasound which was negative. Cholelithiasis but no clinical evidence of cholecystitis  LOS (Days) 2 A FACE TO FACE EVALUATION WAS PERFORMED  KIRSTEINS,ANDREW E 06/18/2015, 8:34 AM

## 2015-06-18 NOTE — Progress Notes (Signed)
Speech Language Pathology Daily Session Note  Patient Details  Name: [name] [name] MRN: [id number] Date of Birth: 08-Aug-1955  Today's Date: 06/18/2015 SLP Individual Time: 1105-1200 SLP Individual Time Calculation (min): 55 min  Short Term Goals: Week 1: SLP Short Term Goal 1 (Week 1): Pt will consume therapeutic trials of nectar thick liquids via teaspoon with minimal overt s/s of aspiration and supervision verbal cues for use of swallowing precautions over three consecutive sessions prior to repeat objective swallow study  SLP Short Term Goal 2 (Week 1): Pt will complete semi-complex functional home management tasks for >80% accuracy wtih supervision verbal cues for working memory.   SLP Short Term Goal 3 (Week 1): Pt will return demonstration of pharyngeal strengthening exercises for 25 repetitions each with supervision verbal cues SLP Short Term Goal 4 (Week 1): Pt will utilize compensatory memory strategies to facilitate recall of complex information wtih supervision verbal cues.    Skilled Therapeutic Interventions:  Pt was seen for skilled ST targeting cognitive goals.  SLP facilitated the session with skilled instruction regarding additional pharyngeal strengthening exercises to add to exercise program.  Pt returned demonstration of effortful swallow, Mendelsohn, and chin tuck against resistance with supervision verbal cues following initial instruction for 15 repetitions each.  Pt instructed to complete 25 repetitions of each exercise 2x/day in between therapy sessions to maximize treatment effects.  Pt consumed trials of nectar thick liquids via teaspoon with supervision cues for use of multiple, hard swallows.  Pt demonstrated delayed coughing following ~50% of trials and intermittently wet vocal quality which SLP suspects to be related to pharyngeal residue per most recent MBS.  Recommend that pt remain NPO with trials of nectar thick liquids via teaspoon with SLP only due to ongoing risk of  aspiration.  SLP administered remaining portions of [facility] standardized cognitive assessment.  Pt scored a 24/30 with deficits primarily limited to working memory.  Pt reports feeling "slightly slower" than baseline.  Pt was left in wheelchair with call bell within reach.  Continue per current plan of care.    Function:  Eating Eating   Modified Consistency Diet:  (trials of nectar thick liquids via teaspoon wtih SLP only ) Eating Assist Level: Supervision or verbal cues (during trials )           Cognition Comprehension Comprehension assist level: Follows basic conversation/direction with no assist  Expression   Expression assist level: Expresses basic needs/ideas: With no assist  Social Interaction Social Interaction assist level: Interacts appropriately 90% of the time - Needs monitoring or encouragement for participation or interaction.  Problem Solving Problem solving assist level: Solves basic 75 - 89% of the time/requires cueing 10 - 24% of the time  Memory Memory assist level: Recognizes or recalls 75 - 89% of the time/requires cueing 10 - 24% of the time    Pain Pain Assessment Pain Assessment: No/denies pain  Therapy/Group: Individual Therapy  Willaim Mode, Selinda Orion 06/18/2015, 4:42 PM

## 2015-06-18 NOTE — Progress Notes (Signed)
Occupational Therapy Session Note  Patient Details  Name: [name] [name] MRN: [id number] Date of Birth: [birth year]  Today's Date: 06/18/2015 OT Individual Time: 0800-0901 OT Individual Time Calculation (min): 61 min    Short Term Goals: Week 1:  OT Short Term Goal 1 (Week 1): Pt will perform all bathing sit to stand with min assist 3 consecutive sessions. OT Short Term Goal 2 (Week 1): Pt will donn pullover shirt with supervison following hemi techniques OT Short Term Goal 3 (Week 1): Pt will complete LB dressing sit to stand with min assist 3 consecutive sessions. OT Short Term Goal 4 (Week 1): Pt will complete toilet transfers with min guard assist using LRAD. OT Short Term Goal 5 (Week 1): Pt will use the RUE for bathing with min assist to wash the LUE.    Skilled Therapeutic Interventions/Progress Updates:    Pt completed supine to sit with min assist during session.  Mod facilitation needed for mobility around the room without assistive device to gather items needed for bathing and dressing tasks.  Facilitation at pelvis and trunk as well as cueing given to maintain neutral tilt and cervical extension.  Provided wash mit for use with the RUE for bathing.  Pt needing mod assist to incorporate during functional task.  Min assist for sit to stand with LB selfcare with min instructional cueing to push up from the wheelchair with the LUE and not pull up on the sink.  Positioned the RUE on the sink for weightbearing while in standing position.  Educated on hemi dressing techniques for donning all clothing.  She was able to donn shirt with increased time and supervision following them this session.  Still with decreased ability to tie shoes, will adapt with shoe buttons for next visit.  Pt also with completion of toileting and toilet transfer during session.  Mod assist needed to complete clothing management sit to stand with mod assist for transfer as well, ambulating from the wheelchair in front of the  sink to the elevated toilet.  Pt left with nursing at end of session.  O2 sats maintained greater than 94% on room air with dyspnea 2/4.    Therapy Documentation Precautions:  Precautions Precautions: Fall Precaution Comments: R hemiplegia UE more impaired than LE Restrictions Weight Bearing Restrictions: No  Pain: Pain Assessment Pain Assessment: No/denies pain ADL: See Function Navigator for Current Functional Status.   Therapy/Group: Individual Therapy  Pressley Tadesse OTR/L 06/18/2015, 10:21 AM

## 2015-06-18 NOTE — Progress Notes (Signed)
Physical Therapy Session Note  Patient Details  Name: [name] [name] MRN: [id number] Date of Birth: 1955/09/14  Today's Date: 06/18/2015 PT Individual Time: 0900-1000 PT Individual Time Calculation (min): 60 min   Short Term Goals: Week 1:  PT Short Term Goal 1 (Week 1): Pt will perform all bed mobiltiy with S, including UE management PT Short Term Goal 2 (Week 1): Pt will perform WC<>bed transfers with Min A consistently PT Short Term Goal 3 (Week 1): Pt will ambulate x50' with RW and Min A PT Short Term Goal 4 (Week 1): Pt will perform 12 stairs with Min A  PT Short Term Goal 5 (Week 1): Pt will demonstrate static standing balance x5 min for functional tasks with close S  Skilled Therapeutic Interventions/Progress Updates:   Patient sitting in wheelchair, RN present to disconnect PEG and patient requested to brush teeth. Performed oral care from wheelchair level with supervision/setup assist. Patient propelled wheelchair using LUE and BLE x 150 ft to gym with supervision. Performed stand pivot transfer from wheelchair to mat table using RW with assist/cues for sequencing R hand splint and min A. Patient demonstrates high fall risk as noted by score of 19/56 on Berg Balance Scale. Patient required multiple prolonged seated rest breaks between all tasks with vitals monitored and HR >106, Sp02 95% on ra. Patient performed stand pivot back to wheelchair without AD with supervision and left sitting in wheelchair with husband present and call bell in reach, RN notified to reconnect PEG.   Therapy Documentation Precautions:  Precautions Precautions: Fall Precaution Comments: R hemiplegia UE more impaired than LE Restrictions Weight Bearing Restrictions: No Pain: Pain Assessment Pain Assessment: No/denies pain Balance: Balance Balance Assessed: Yes Standardized Balance Assessment Standardized Balance Assessment: Berg Balance Test Berg Balance Test Sit to Stand: Needs minimal aid to stand or  to stabilize Standing Unsupported: Able to stand 2 minutes with supervision Sitting with Back Unsupported but Feet Supported on Floor or Stool: Able to sit safely and securely 2 minutes Stand to Sit: Uses backs of legs against chair to control descent Transfers: Needs one person to assist Standing Unsupported with Eyes Closed: Able to stand 3 seconds Standing Ubsupported with Feet Together: Needs help to attain position and unable to hold for 15 seconds From Standing, Reach Forward with Outstretched Arm: Reaches forward but needs supervision From Standing Position, Pick up Object from Floor: Able to pick up shoe, needs supervision From Standing Position, Turn to Look Behind Over each Shoulder: Needs supervision when turning Turn 360 Degrees: Needs assistance while turning Standing Unsupported, Alternately Place Feet on Step/Stool: Able to complete >2 steps/needs minimal assist Standing Unsupported, One Foot in Front: Loses balance while stepping or standing Standing on One Leg: Unable to try or needs assist to prevent fall Total Score: 19/56  See Function Navigator for Current Functional Status.   Therapy/Group: Individual Therapy  Laretta Alstrom 06/18/2015, 10:13 AM

## 2015-06-19 ENCOUNTER — Inpatient Hospital Stay (HOSPITAL_COMMUNITY): Payer: BC Managed Care – PPO | Admitting: Speech Pathology

## 2015-06-19 ENCOUNTER — Encounter (HOSPITAL_COMMUNITY): Payer: BC Managed Care – PPO | Admitting: Physical Therapy

## 2015-06-19 ENCOUNTER — Inpatient Hospital Stay (HOSPITAL_COMMUNITY): Payer: BC Managed Care – PPO | Admitting: Physical Therapy

## 2015-06-19 ENCOUNTER — Inpatient Hospital Stay (HOSPITAL_COMMUNITY): Payer: BC Managed Care – PPO | Admitting: Occupational Therapy

## 2015-06-19 LAB — GLUCOSE, CAPILLARY
GLUCOSE-CAPILLARY: 144 mg/dL — AB (ref 65–99)
GLUCOSE-CAPILLARY: 164 mg/dL — AB (ref 65–99)
GLUCOSE-CAPILLARY: 117 mg/dL — AB (ref 65–99)
Glucose-Capillary: 140 mg/dL — ABNORMAL HIGH (ref 65–99)
Glucose-Capillary: 154 mg/dL — ABNORMAL HIGH (ref 65–99)

## 2015-06-19 NOTE — Progress Notes (Signed)
60 y.o.right handed female with history of Sjogrens disease/rheumatoid arthritis maintained on plaquenil and Prednisone, noted history of seizure but not on antiepileptic meds and dysphagia. Patient diagnosed 4 days prior withFlu and placed on Tamiflu. Lives with spouse independent prior to admission. 2 level home with bedroom on main level. 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 Presented 05/29/2015 with right side weakness and gait instability. Patient with severe restrictive airway distress when he presented to the ED with oxygen saturations in the 70s requiring nonrebreathing mask and later intubated with slow extubation follow-up per critical care pulmonary services. Chest x-ray showed large right lung consolidative changes. CT angiogram chest no evidence of pulmonary emboli. Cranial CT scan negative. MRI of the brain showed acute small left frontal lobe infarct  Subjective/Complaints: Looks more alert this morning more interactive.  Review of systems negative for chest pain, shortness of breath, nausea vomiting diarrhea constipation Objective: Vital Signs: Blood pressure 97/54, pulse 65, temperature 97.8 F (36.6 C), temperature source Oral, resp. rate 18, height 5' 1"  (1.549 m), weight 55.248 kg (121 lb 12.8 oz), SpO2 99 %. No results found. Results for orders placed or performed during the hospital encounter of 06/16/15 (from the past 72 hour(s))  CBC     Status: Abnormal   Collection Time: 06/16/15  7:40 PM  Result Value Ref Range   WBC 5.8 4.0 - 10.5 K/uL   RBC 3.31 (L) 3.87 - 5.11 MIL/uL   Hemoglobin 9.4 (L) 12.0 - 15.0 g/dL   HCT 30.8 (L) 36.0 - 46.0 %   MCV 93.1 78.0 - 100.0 fL   MCH 28.4 26.0 - 34.0 pg   MCHC 30.5 30.0 - 36.0 g/dL   RDW 19.9 (H) 11.5 - 15.5 %   Platelets 707 (H) 150 - 400 K/uL  Creatinine, serum     Status: None   Collection Time: 06/16/15  7:40 PM  Result Value Ref Range   Creatinine, Ser 0.51 0.44 - 1.00 mg/dL   GFR calc non Af Amer  >60 >60 mL/min   GFR calc Af Amer >60 >60 mL/min    Comment: (NOTE) The eGFR has been calculated using the CKD EPI equation. This calculation has not been validated in all clinical situations. eGFR's persistently <60 mL/min signify possible Chronic Kidney Disease.   Glucose, capillary     Status: Abnormal   Collection Time: 06/16/15  8:18 PM  Result Value Ref Range   Glucose-Capillary 147 (H) 65 - 99 mg/dL  Glucose, capillary     Status: Abnormal   Collection Time: 06/17/15 12:12 AM  Result Value Ref Range   Glucose-Capillary 122 (H) 65 - 99 mg/dL  Glucose, capillary     Status: Abnormal   Collection Time: 06/17/15  4:02 AM  Result Value Ref Range   Glucose-Capillary 135 (H) 65 - 99 mg/dL  CBC WITH DIFFERENTIAL     Status: Abnormal   Collection Time: 06/17/15  5:47 AM  Result Value Ref Range   WBC 5.9 4.0 - 10.5 K/uL   RBC 3.65 (L) 3.87 - 5.11 MIL/uL   Hemoglobin 10.2 (L) 12.0 - 15.0 g/dL   HCT 34.4 (L) 36.0 - 46.0 %   MCV 94.2 78.0 - 100.0 fL   MCH 27.9 26.0 - 34.0 pg   MCHC 29.7 (L) 30.0 - 36.0 g/dL   RDW 20.2 (H) 11.5 - 15.5 %   Platelets 723 (H) 150 - 400 K/uL   Neutrophils Relative % 55 %   Neutro Abs  3.3 1.7 - 7.7 K/uL   Lymphocytes Relative 24 %   Lymphs Abs 1.4 0.7 - 4.0 K/uL   Monocytes Relative 19 %   Monocytes Absolute 1.1 (H) 0.1 - 1.0 K/uL   Eosinophils Relative 1 %   Eosinophils Absolute 0.1 0.0 - 0.7 K/uL   Basophils Relative 1 %   Basophils Absolute 0.0 0.0 - 0.1 K/uL  Comprehensive metabolic panel     Status: Abnormal   Collection Time: 06/17/15  5:47 AM  Result Value Ref Range   Sodium 134 (L) 135 - 145 mmol/L   Potassium 4.1 3.5 - 5.1 mmol/L   Chloride 100 (L) 101 - 111 mmol/L   CO2 26 22 - 32 mmol/L   Glucose, Bld 149 (H) 65 - 99 mg/dL   BUN 13 6 - 20 mg/dL   Creatinine, Ser 0.57 0.44 - 1.00 mg/dL   Calcium 8.9 8.9 - 10.3 mg/dL   Total Protein 6.9 6.5 - 8.1 g/dL   Albumin 2.2 (L) 3.5 - 5.0 g/dL   AST 84 (H) 15 - 41 U/L   ALT 63 (H) 14 - 54  U/L   Alkaline Phosphatase 427 (H) 38 - 126 U/L   Total Bilirubin 0.3 0.3 - 1.2 mg/dL   GFR calc non Af Amer >60 >60 mL/min   GFR calc Af Amer >60 >60 mL/min    Comment: (NOTE) The eGFR has been calculated using the CKD EPI equation. This calculation has not been validated in all clinical situations. eGFR's persistently <60 mL/min signify possible Chronic Kidney Disease.    Anion gap 8 5 - 15  Glucose, capillary     Status: Abnormal   Collection Time: 06/17/15  8:42 AM  Result Value Ref Range   Glucose-Capillary 159 (H) 65 - 99 mg/dL  Glucose, capillary     Status: Abnormal   Collection Time: 06/17/15 11:24 AM  Result Value Ref Range   Glucose-Capillary 131 (H) 65 - 99 mg/dL  Glucose, capillary     Status: Abnormal   Collection Time: 06/17/15  3:50 PM  Result Value Ref Range   Glucose-Capillary 199 (H) 65 - 99 mg/dL  Glucose, capillary     Status: Abnormal   Collection Time: 06/17/15  8:38 PM  Result Value Ref Range   Glucose-Capillary 203 (H) 65 - 99 mg/dL  Glucose, capillary     Status: Abnormal   Collection Time: 06/18/15 12:05 AM  Result Value Ref Range   Glucose-Capillary 161 (H) 65 - 99 mg/dL  Glucose, capillary     Status: Abnormal   Collection Time: 06/18/15  4:00 AM  Result Value Ref Range   Glucose-Capillary 156 (H) 65 - 99 mg/dL  Glucose, capillary     Status: Abnormal   Collection Time: 06/18/15  8:30 AM  Result Value Ref Range   Glucose-Capillary 150 (H) 65 - 99 mg/dL  Glucose, capillary     Status: Abnormal   Collection Time: 06/18/15 11:56 AM  Result Value Ref Range   Glucose-Capillary 173 (H) 65 - 99 mg/dL  Glucose, capillary     Status: Abnormal   Collection Time: 06/18/15  4:26 PM  Result Value Ref Range   Glucose-Capillary 155 (H) 65 - 99 mg/dL  Glucose, capillary     Status: Abnormal   Collection Time: 06/18/15  8:59 PM  Result Value Ref Range   Glucose-Capillary 130 (H) 65 - 99 mg/dL  Glucose, capillary     Status: Abnormal   Collection Time:  06/18/15 11:52 PM  Result Value Ref Range   Glucose-Capillary 132 (H) 65 - 99 mg/dL  Glucose, capillary     Status: Abnormal   Collection Time: 06/19/15  4:27 AM  Result Value Ref Range   Glucose-Capillary 140 (H) 65 - 99 mg/dL  Glucose, capillary     Status: Abnormal   Collection Time: 06/19/15  8:12 AM  Result Value Ref Range   Glucose-Capillary 144 (H) 65 - 99 mg/dL  Glucose, capillary     Status: Abnormal   Collection Time: 06/19/15 11:41 AM  Result Value Ref Range   Glucose-Capillary 154 (H) 65 - 99 mg/dL      General: No acute distress Mood and affect are appropriate Heart: Regular rate and rhythm no rubs murmurs or extra sounds Lungs: Clear to auscultation, breathing unlabored, no rales or wheezes, decreased breath sounds at bases Abdomen: Positive bowel sounds, soft nontender to palpation, nondistended Extremities: No clubbing, cyanosis, or edema Skin: No evidence of breakdown, no evidence of rash Neurologic: Cranial nerves II through XII intact, motor strength is 5/5 in left deltoid, bicep, tricep, grip, hip flexor, knee extensors, ankle dorsiflexor and plantar flexor, 3 minus right deltoid, bicep, tricep, grip,, 4 minus right hip flexor and knee extensor and 2 minus ankle dorsiflexor Sensory exam normal sensation to light touch in bilateral upper and lower extremities Oriented to person place and time   Musculoskeletal: Full passive range  of motion in all 4 extremities. No joint swelling   Assessment/Plan: 1. Functional deficits secondary to Right side weakness/dysphagia secondary to left frontal lobe infarct complicated by respiratory failure from septic shock. which require 3+ hours per day of interdisciplinary therapy in a comprehensive inpatient rehab setting. Physiatrist is providing close team supervision and 24 hour management of active medical problems listed below. Physiatrist and rehab team continue to assess barriers to discharge/monitor patient progress toward  functional and medical goals. FIM: Function - Bathing Position: Wheelchair/chair at sink Body parts bathed by patient: Right arm, Chest, Abdomen, Right upper leg, Left upper leg, Right lower leg, Left lower leg, Front perineal area, Buttocks Body parts bathed by helper: Back, Left arm  Function- Upper Body Dressing/Undressing What is the patient wearing?: Pull over shirt/dress Pull over shirt/dress - Perfomed by patient: Thread/unthread left sleeve, Put head through opening, Thread/unthread right sleeve Pull over shirt/dress - Perfomed by helper: Pull shirt over trunk, Thread/unthread right sleeve Assist Level: Supervision or verbal cues Function - Lower Body Dressing/Undressing What is the patient wearing?: Underwear, Pants, Shoes, Ted Hose Position: Wheelchair/chair at [employer] - Performed by patient: Thread/unthread right underwear leg, Thread/unthread left underwear leg, Pull underwear up/down Underwear - Performed by helper: Pull underwear up/down Pants- Performed by patient: Thread/unthread right pants leg, Thread/unthread left pants leg, Pull pants up/down, Fasten/unfasten pants Shoes - Performed by patient: Don/doff right shoe, Don/doff left shoe Shoes - Performed by helper: Fasten right, Fasten left TED Hose - Performed by helper: Don/doff right TED hose, Don/doff left TED hose Assist for footwear: Supervision/touching assist  Function - Toileting Toileting steps completed by patient: Adjust clothing prior to toileting, Performs perineal hygiene, Adjust clothing after toileting Toileting steps completed by helper: Adjust clothing prior to toileting, Performs perineal hygiene, Adjust clothing after toileting Toileting Assistive Devices: Grab bar or rail Assist level: Touching or steadying assistance (Pt.75%)  Function - Toilet Transfers Toilet transfer assistive device: Grab bar Assist level to toilet: Moderate assist (Pt 50 - 74%/lift or lower) Assist level from toilet:  Moderate assist (Pt 50 - 74%/lift or  lower)  Function - Chair/bed transfer Chair/bed transfer method: Stand pivot Chair/bed transfer assist level: Touching or steadying assistance (Pt > 75%) Chair/bed transfer assistive device: Bedrails, Armrests Chair/bed transfer details: Verbal cues for sequencing, Verbal cues for technique, Verbal cues for precautions/safety, Manual facilitation for weight shifting, Manual facilitation for placement  Function - Locomotion: Wheelchair Will patient use wheelchair at discharge?: Yes Type: Manual Max wheelchair distance: 150 Assist Level: Supervision or verbal cues Assist Level: Supervision or verbal cues Wheel 150 feet activity did not occur: Safety/medical concerns Assist Level: Supervision or verbal cues Turns around,maneuvers to table,bed, and toilet,negotiates 3% grade,maneuvers on rugs and over doorsills: No Function - Locomotion: Ambulation Assistive device: Walker-rolling Max distance: 180 Assist level: Supervision or verbal cues Assist level: Supervision or verbal cues Walk 50 feet with 2 turns activity did not occur: Safety/medical concerns Assist level: Supervision or verbal cues Walk 150 feet activity did not occur: Safety/medical concerns Assist level: Supervision or verbal cues Walk 10 feet on uneven surfaces activity did not occur: Safety/medical concerns  Function - Comprehension Comprehension: Auditory Comprehension assist level: Follows basic conversation/direction with no assist  Function - Expression Expression: Verbal Expression assist level: Expresses basic needs/ideas: With no assist  Function - Social Interaction Social Interaction assist level: Interacts appropriately 90% of the time - Needs monitoring or encouragement for participation or interaction.  Function - Problem Solving Problem solving assist level: Solves basic 75 - 89% of the time/requires cueing 10 - 24% of the time  Function - Memory Memory assist  level: Recognizes or recalls 75 - 89% of the time/requires cueing 10 - 24% of the time Patient normally able to recall (first 3 days only): Current season, Location of own room, Staff names and faces, That he or she is in a hospital  Medical Problem List and Plan: 1.  Right side weakness/dysphagia secondary to left frontal lobe infarct complicated by respiratory failure.Continue therapy2.  DVT Prophylaxis/Anticoagulation: SCDs. Monitor for any signs of DVT 3. Pain Management: Tylenol as needed 4. Dysphagia/esophageal stricture. Status post gastrostomy tube 06/14/2015 per interventional radiology. Speech therapy follow-up. Plan outpatient EGD endoscopy for dilatation of stricture, swallowing dysfunction improving when comparing the modified barium swallow performed 06/09/2015 versus 06/15/2015 5. Neuropsych: This patient is capable of making decisions on her own behalf. 6. Skin/Wound Care: Routine skin checks, gastrostomy tube site looks okay except has some dried blood. Will write for daily normal saline rinses 7. Fluids/Electrolytes/Nutrition: Routine I&O's with follow-up chemistries 8. ID. Strep pyogenes bacteremia. Followed by infectious disease. Continue ceftriaxone questions 6 week course and confirm with Dr. Drucilla Schmidt. Maintain contact precautions 9. Atrial flutter with RVR. Lopressor 12.5 mg twice a day 10. Rheumatoid arthritis/Sjorgens disease. Discuss resuming plaquenil and chronic prednisone.no active joint inflammation at the current time 11. Hyperglycemia secondary to chronic prednisone/tube feeds. Hemoglobin A1c 6.6. Check blood sugars while on tube feeds 12. Hyperlipidemia. Lipitor, patient has elevated liver function tests, we'll reduce Lipitor from 40 mg to 20 mg and recheck C met  in 1-2 weeks 13. Hypothyroidism. Resume Synthroid 14. Elevated LFTs, had recent gallbladder ultrasound which was negative. Cholelithiasis but no clinical evidence of cholecystitis  LOS (Days) 3 A FACE TO  FACE EVALUATION WAS PERFORMED  Caitland Porchia E 06/19/2015, 11:56 AM

## 2015-06-19 NOTE — Progress Notes (Signed)
Physical Therapy Session Note  Patient Details  Name: [name] [name] MRN: [id number] Date of Birth: Mar 13, 1956  Today's Date: 06/19/2015 PT Group Time: [id number] PT Group Time Calculation (min): 70 min  Short Term Goals: Week 1:  PT Short Term Goal 1 (Week 1): Pt will perform all bed mobiltiy with S, including UE management PT Short Term Goal 2 (Week 1): Pt will perform WC<>bed transfers with Min A consistently PT Short Term Goal 3 (Week 1): Pt will ambulate x50' with RW and Min A PT Short Term Goal 4 (Week 1): Pt will perform 12 stairs with Min A  PT Short Term Goal 5 (Week 1): Pt will demonstrate static standing balance x5 min for functional tasks with close S  Skilled Therapeutic Interventions/Progress Updates:    Pt received supine in bed, denies pain and agreeable to treatment. Supine>sit with HOB elevated and S. Stand pivot transfer minA to w/c. Transported to gym totalA for energy conservation and time management. Participated in LE strengthening exercise group including sit <>stand ladder reps (2, 4, 6, 8, 10 reps, no UEs), 1# cuff weights for long arc quad, hip flexion marching, hip adduction isometric, hip abduction with theraband. Gait x2 trials with RW x90' and 180' with min guard initially, close S second trial. Educated pt on safety with hand splint on RUE, and importance of removing hand prior to sitting to avoid trauma to wrist. Returned to room and remained seated in w/c with nursing staff alerted to pt request to use restroom; all needs within reach.   Therapy Documentation Precautions:  Precautions Precautions: Fall Precaution Comments: R hemiplegia UE more impaired than LE Restrictions Weight Bearing Restrictions: No Pain: Pain Assessment Pain Assessment: No/denies pain Pain Score: 0-No pain   See Function Navigator for Current Functional Status.   Therapy/Group: Group Therapy  Luberta Mutter 06/19/2015, 12:25 PM

## 2015-06-19 NOTE — Progress Notes (Addendum)
Speech Language Pathology Daily Session Note  Patient Details  Name: [name] [name] MRN: [id number] Date of Birth: 01-21-1956  Today's Date: 06/19/2015 SLP Individual Time: [id number] SLP Individual Time Calculation (min): 45 min  Short Term Goals: Week 1: SLP Short Term Goal 1 (Week 1): Pt will consume therapeutic trials of nectar thick liquids via teaspoon with minimal overt s/s of aspiration and supervision verbal cues for use of swallowing precautions over three consecutive sessions prior to repeat objective swallow study  SLP Short Term Goal 2 (Week 1): Pt will complete semi-complex functional home management tasks for >80% accuracy wtih supervision verbal cues for working memory.   SLP Short Term Goal 3 (Week 1): Pt will return demonstration of pharyngeal strengthening exercises for 25 repetitions each with supervision verbal cues SLP Short Term Goal 4 (Week 1): Pt will utilize compensatory memory strategies to facilitate recall of complex information wtih supervision verbal cues.    Skilled Therapeutic Interventions: Skilled treatment session focused on dysphagia goals. SLP facilitated the session by providing supervision verbal cues to complete pharyngeal strengthening exercises appropriately. Patient completed oral care with suction toothbrush with set-up assist and required clinician assistance for thoroughness due to moderate amount of dried secretions on her soft palate and uvula. Patient consumed trials of nectar thick liquids via teaspoon with supervision cues for use of multiple, hard swallows without overt s/s of aspiration.  Recommend that pt remain NPO with trials of nectar thick liquids via teaspoon with SLP only due to ongoing risk of aspiration. Patient left in upright in bed with call bell within reach. Continue current plan of care.  Function:  Eating Eating   Modified Consistency Diet: Yes (trials of nectar-thick liquids via tsp with SLP only) Eating Assist Level:  Supervision or verbal cues (during trials)           Cognition Comprehension Comprehension assist level: Follows basic conversation/direction with no assist  Expression   Expression assist level: Expresses basic needs/ideas: With no assist  Social Interaction Social Interaction assist level: Interacts appropriately 90% of the time - Needs monitoring or encouragement for participation or interaction.  Problem Solving Problem solving assist level: Solves basic 75 - 89% of the time/requires cueing 10 - 24% of the time  Memory Memory assist level: Recognizes or recalls 75 - 89% of the time/requires cueing 10 - 24% of the time    Pain Pain Assessment Pain Assessment: No/denies pain Pain Score: 0-No pain  Therapy/Group: Individual Therapy  Shenea Giacobbe 06/19/2015, 12:50 PM

## 2015-06-19 NOTE — Progress Notes (Signed)
Occupational Therapy Session Note  Patient Details  Name: [name] [name] MRN: [id number] Date of Birth: September 27, 1955  Today's Date: 06/19/2015 OT Individual Time: [id number] OT Individual Time Calculation (min): 44 min    Short Term Goals: Week 1:  OT Short Term Goal 1 (Week 1): Pt will perform all bathing sit to stand with min assist 3 consecutive sessions. OT Short Term Goal 2 (Week 1): Pt will donn pullover shirt with supervison following hemi techniques OT Short Term Goal 3 (Week 1): Pt will complete LB dressing sit to stand with min assist 3 consecutive sessions. OT Short Term Goal 4 (Week 1): Pt will complete toilet transfers with min guard assist using LRAD. OT Short Term Goal 5 (Week 1): Pt will use the RUE for bathing with min assist to wash the LUE.    Skilled Therapeutic Interventions/Progress Updates:  Upon entering the room, pt supine in bed with no c/o pain. Pt requesting to wash and change clothing at sink. Pt performed stand pivot with steady assist in wheelchair. Pt engaged in bathing at sink with sit <>stand. Pt placing R UE into weight bearing on sink when standing for LB clothing management and hygiene. Pt required steady assistance with balance. She washed L UE with hand over hand assistance from R. Pt taking multiple rest breaks secondary to fatigue. Shoe buttons placed in shoes and OT demonstrated its used. Pt requested to return to be to rest. Call bell and all needed items within reach upon exiting the room.   Therapy Documentation Precautions:  Precautions Precautions: Fall Precaution Comments: R hemiplegia UE more impaired than LE Restrictions Weight Bearing Restrictions: No  See Function Navigator for Current Functional Status.   Therapy/Group: Individual Therapy  Phineas Semen 06/19/2015, 4:17 PM

## 2015-06-19 NOTE — Progress Notes (Signed)
Physical Therapy Session Note  Patient Details  Name: [name] [name] MRN: [id number] Date of Birth: January 25, 1956  Today's Date: 06/19/2015 PT Individual Time: 1500-1530 PT Individual Time Calculation (min): 30 min   Short Term Goals: Week 1:  PT Short Term Goal 1 (Week 1): Pt will perform all bed mobiltiy with S, including UE management PT Short Term Goal 2 (Week 1): Pt will perform WC<>bed transfers with Min A consistently PT Short Term Goal 3 (Week 1): Pt will ambulate x50' with RW and Min A PT Short Term Goal 4 (Week 1): Pt will perform 12 stairs with Min A  PT Short Term Goal 5 (Week 1): Pt will demonstrate static standing balance x5 min for functional tasks with close S  Skilled Therapeutic Interventions/Progress Updates:  Pt was seen bedside in the pm. Pt transferred supine to edge of bed with side rail, head of bed elevated and S. Pt transferred sit to stand with min A and verbal cues. Pt ambulated to bathroom with rolling walker and c/s to min guard. Pt performed toilet transfers with rolling walker and min A. Pt ambulated back to bed with S and rolling walker. Pt ambulated 150 feet x 2 with rolling walker and S with verbal cues. Pt left sitting up in w/c with call bell within reach.   Therapy Documentation Precautions:  Precautions Precautions: Fall Precaution Comments: R hemiplegia UE more impaired than LE Restrictions Weight Bearing Restrictions: No General:   Pain: No c/o pain.   See Function Navigator for Current Functional Status.   Therapy/Group: Individual Therapy  Dub Amis 06/19/2015, 3:50 PM

## 2015-06-19 NOTE — IPOC Note (Signed)
Overall Plan of Care [facility]) Patient Details Name: [name] [name] MRN: [id number] DOB: [birth year]  Admitting Diagnosis: L CVA  Hospital Problems: Active Problems:   Ischemic stroke of frontal lobe (HCC)   Dysphagia as late effect of cerebrovascular disease   Hemiparesis affecting dominant side as late effect of stroke (HCC)   Esophageal stricture   Bacteremia   Atrial flutter (HCC)   Rheumatoid arthritis ([place])   Hyperglycemia   Thyroid activity decreased     Functional Problem List: Nursing Endurance, Medication Management, Nutrition, Perception, Safety, Sensory, Skin Integrity  PT Balance, Endurance, Motor, Nutrition, Perception, Safety  OT Balance, Cognition, Endurance, Motor, Sensory  SLP Nutrition, Cognition  TR         Basic ADL's: OT Eating, Grooming, Bathing, Dressing, Toileting     Advanced  ADL's: OT Light Housekeeping, Simple Meal Preparation     Transfers: PT Bed Mobility, Bed to Chair, Car, Manufacturing systems engineer, Metallurgist: PT Ambulation, Emergency planning/management officer, Stairs     Additional Impairments: OT Fuctional Use of Upper Extremity  SLP Social Cognition, Swallowing   Memory  TR      Anticipated Outcomes Item Anticipated Outcome  Self Feeding modified independent  Swallowing  Supervision    Basic self-care  supervision to modified independent  Toileting  supervision   Bathroom Transfers supervision  Bowel/Bladder  mod I  Transfers  Supervision  Locomotion  Supervision  Communication     Cognition  Supervision   Pain  3 or less  Safety/Judgment  min assist   Therapy Plan: PT Intensity: Minimum of 1-2 x/day ,45 to 90 minutes PT Frequency: 5 out of 7 days PT Duration Estimated Length of Stay: 10-14 days OT Intensity: Minimum of 1-2 x/day, 45 to 90 minutes OT Frequency: 5 out of 7 days OT Duration/Estimated Length of Stay: 12-14 days SLP Intensity: Minumum of 1-2 x/day, 30 to 90 minutes SLP Frequency: 3 to 5 out of 7  days SLP Duration/Estimated Length of Stay: 12-14 days        Team Interventions: Nursing Interventions Patient/Family Education, Medication Management, Disease Management/Prevention, Dysphagia/Aspiration Precaution Training, Skin Care/Wound Management  PT interventions Ambulation/gait training, Training and development officer, Community reintegration, Cognitive remediation/compensation, Discharge planning, Disease management/prevention, DME/adaptive equipment instruction, Functional mobility training, Neuromuscular re-education, Pain management, Patient/family education, Psychosocial support, Skin care/wound management, Splinting/orthotics, Stair training, Therapeutic Activities, Therapeutic Exercise, UE/LE Strength taining/ROM, UE/LE Coordination activities, Wheelchair propulsion/positioning  OT Interventions Training and development officer, Cognitive remediation/compensation, DME/adaptive equipment instruction, Disease mangement/prevention, Neuromuscular re-education, Self Care/advanced ADL retraining, UE/LE Strength taining/ROM, Patient/family education, Community reintegration, Functional electrical stimulation, Discharge planning, Functional mobility training, Psychosocial support, Therapeutic Activities, UE/LE Coordination activities, Splinting/orthotics, Therapeutic Exercise  SLP Interventions Cognitive remediation/compensation, Cueing hierarchy, Dysphagia/aspiration precaution training, Environmental controls, Functional tasks, Internal/external aids  TR Interventions    SW/CM Interventions Discharge Planning, Psychosocial Support, Patient/Family Education    Team Discharge Planning: Destination: PT-Home ,OT- Home , SLP-Home Projected Follow-up: PT-24 hour supervision/assistance, Outpatient PT, OT-  Outpatient OT, SLP-Home Health SLP, Outpatient SLP, 24 hour supervision/assistance Projected Equipment Needs: PT-Rolling walker with 5" wheels, To be determined, OT- To be determined, SLP-To be  determined Equipment Details: PT-May need WC as well, OT-  Patient/family involved in discharge planning: PT- Patient,  OT-Patient, SLP-Patient  MD ELOS: 16-18d Medical Rehab Prognosis:  Good Assessment: 60 y.o.right handed female with history of Sjogrens disease/rheumatoid arthritis maintained on plaquenil and Prednisone, noted history of seizure but not on antiepileptic meds and dysphagia. Patient diagnosed 4 days prior withFlu  and placed on Tamiflu. Lives with spouse independent prior to admission. 2 level home with bedroom on main level. 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 Presented 05/29/2015 with right side weakness and gait instability. Patient with severe restrictive airway distress when he presented to the ED with oxygen saturations in the 70s requiring nonrebreathing mask and later intubated with slow extubation follow-up per critical care pulmonary services. Chest x-ray showed large right lung consolidative changes. CT angiogram chest no evidence of pulmonary emboli. Cranial CT scan negative. 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 Echocardiogram with ejection fraction of 65% no wall motion abnormalities. 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 EEG was negative for seizure. Patient did not receive TPA. Blood cultures showed group A strep pansensitive 05/30/2015 and currently maintained on Rocephin with droplet contact precautions with question duration of antibiotics. Hospital course atrial flutter currently maintained Lovenox 40 mg daily and asymptomatic with low dose beta blocker added. Modified barium swallow 06/15/2015 shows moderate pharyngeal dysphagia as well as esophageal stricture. Patient underwent gastrostomy tube per interventional radiology 06/14/2015. Currently maintained on Lovenox for DVT prophylaxis. Plan is to begin aspirin/ Plavix therapy for CVA prophylaxis after PEG tube feeds  initiated   Now requiring 24/7 Rehab RN,MD, as well as CIR level PT, OT and SLP.  Treatment team will focus on ADLs and mobility with goals set at Sup  See Team Conference Notes for weekly updates to the plan of care

## 2015-06-20 ENCOUNTER — Encounter (HOSPITAL_COMMUNITY): Payer: BC Managed Care – PPO | Admitting: Physical Therapy

## 2015-06-20 LAB — GLUCOSE, CAPILLARY
GLUCOSE-CAPILLARY: 130 mg/dL — AB (ref 65–99)
GLUCOSE-CAPILLARY: 135 mg/dL — AB (ref 65–99)
Glucose-Capillary: 135 mg/dL — ABNORMAL HIGH (ref 65–99)
Glucose-Capillary: 136 mg/dL — ABNORMAL HIGH (ref 65–99)
Glucose-Capillary: 140 mg/dL — ABNORMAL HIGH (ref 65–99)
Glucose-Capillary: 169 mg/dL — ABNORMAL HIGH (ref 65–99)

## 2015-06-20 MED ORDER — SALINE SPRAY 0.65 % NA SOLN
2.0000 | NASAL | Status: DC | PRN
  Administered 2015-06-20 – 2015-06-21 (×2): 2 via NASAL
  Filled 2015-06-20: qty 44

## 2015-06-20 MED ORDER — METOPROLOL TARTRATE 25 MG/10 ML ORAL SUSPENSION
6.2500 mg | Freq: Two times a day (BID) | ORAL | Status: DC
  Administered 2015-06-20 – 2015-06-29 (×12): 6.25 mg
  Filled 2015-06-20 (×15): qty 10

## 2015-06-20 NOTE — Progress Notes (Signed)
Subjective/Complaints: No issues overnight, cognition is clearing, she knows her therapy schedule. Husband is coming in. Would like therapeutic grounds pass.  Review of systems negative for chest pain, shortness of breath, nausea vomiting diarrhea constipation Objective: Vital Signs: Blood pressure 92/52, pulse 75, temperature 97.9 F (36.6 C), temperature source Oral, resp. rate 18, height _0  (1.549 m), weight 54.931 kg (121 lb 1.6 oz), SpO2 98 %. No results found. Results for orders placed or performed during the hospital encounter of 06/16/15 (from the past 72 hour(s))  Glucose, capillary     Status: Abnormal   Collection Time: 06/17/15 11:24 AM  Result Value Ref Range   Glucose-Capillary 131 (H) 65 - 99 mg/dL  Glucose, capillary     Status: Abnormal   Collection Time: 06/17/15  3:50 PM  Result Value Ref Range   Glucose-Capillary 199 (H) 65 - 99 mg/dL  Glucose, capillary     Status: Abnormal   Collection Time: 06/17/15  8:38 PM  Result Value Ref Range   Glucose-Capillary 203 (H) 65 - 99 mg/dL  Glucose, capillary     Status: Abnormal   Collection Time: 06/18/15 12:05 AM  Result Value Ref Range   Glucose-Capillary 161 (H) 65 - 99 mg/dL  Glucose, capillary     Status: Abnormal   Collection Time: 06/18/15  4:00 AM  Result Value Ref Range   Glucose-Capillary 156 (H) 65 - 99 mg/dL  Glucose, capillary     Status: Abnormal   Collection Time: 06/18/15  8:30 AM  Result Value Ref Range   Glucose-Capillary 150 (H) 65 - 99 mg/dL  Glucose, capillary     Status: Abnormal   Collection Time: 06/18/15 11:56 AM  Result Value Ref Range   Glucose-Capillary 173 (H) 65 - 99 mg/dL  Glucose, capillary     Status: Abnormal   Collection Time: 06/18/15  4:26 PM  Result Value Ref Range   Glucose-Capillary 155 (H) 65 - 99 mg/dL  Glucose, capillary     Status: Abnormal   Collection Time: 06/18/15  8:59 PM  Result Value Ref Range   Glucose-Capillary 130 (H) 65 - 99 mg/dL  Glucose, capillary      Status: Abnormal   Collection Time: 06/18/15 11:52 PM  Result Value Ref Range   Glucose-Capillary 132 (H) 65 - 99 mg/dL  Glucose, capillary     Status: Abnormal   Collection Time: 06/19/15  4:27 AM  Result Value Ref Range   Glucose-Capillary 140 (H) 65 - 99 mg/dL  Glucose, capillary     Status: Abnormal   Collection Time: 06/19/15  8:12 AM  Result Value Ref Range   Glucose-Capillary 144 (H) 65 - 99 mg/dL  Glucose, capillary     Status: Abnormal   Collection Time: 06/19/15 11:41 AM  Result Value Ref Range   Glucose-Capillary 154 (H) 65 - 99 mg/dL  Glucose, capillary     Status: Abnormal   Collection Time: 06/19/15  4:24 PM  Result Value Ref Range   Glucose-Capillary 164 (H) 65 - 99 mg/dL   Comment 1 Document in Chart   Glucose, capillary     Status: Abnormal   Collection Time: 06/19/15  8:20 PM  Result Value Ref Range   Glucose-Capillary 117 (H) 65 - 99 mg/dL  Glucose, capillary     Status: Abnormal   Collection Time: 06/20/15 12:00 AM  Result Value Ref Range   Glucose-Capillary 140 (H) 65 - 99 mg/dL  Glucose, capillary     Status: Abnormal  Collection Time: 06/20/15  4:14 AM  Result Value Ref Range   Glucose-Capillary 135 (H) 65 - 99 mg/dL  Glucose, capillary     Status: Abnormal   Collection Time: 06/20/15  8:07 AM  Result Value Ref Range   Glucose-Capillary 130 (H) 65 - 99 mg/dL   Comment 1 Notify RN       General: No acute distress Mood and affect are appropriate Heart: Regular rate and rhythm no rubs murmurs or extra sounds Lungs: Clear to auscultation, breathing unlabored, no rales or wheezes, decreased breath sounds at bases Abdomen: Positive bowel sounds, soft nontender to palpation, nondistended Extremities: No clubbing, cyanosis, or edema Skin: No evidence of breakdown, no evidence of rash Neurologic: Cranial nerves II through XII intact, motor strength is 5/5 in left deltoid, bicep, tricep, grip, hip flexor, knee extensors, ankle dorsiflexor and plantar  flexor, 3 minus right deltoid, bicep, tricep, grip,, 4 minus right hip flexor and knee extensor and 2 minus ankle dorsiflexor Sensory exam normal sensation to light touch in bilateral upper and lower extremities Oriented to person place and time   Musculoskeletal: Full passive range  of motion in all 4 extremities. No joint swelling   Assessment/Plan: 1. Functional deficits secondary to Right side weakness/dysphagia secondary to left frontal lobe infarct complicated by respiratory failure from septic shock. which require 3+ hours per day of interdisciplinary therapy in a comprehensive inpatient rehab setting. Physiatrist is providing close team supervision and 24 hour management of active medical problems listed below. Physiatrist and rehab team continue to assess barriers to discharge/monitor patient progress toward functional and medical goals. FIM: Function - Bathing Position: Wheelchair/chair at sink Body parts bathed by patient: Right arm, Chest, Abdomen, Right upper leg, Left upper leg, Right lower leg, Left lower leg, Front perineal area, Buttocks Body parts bathed by helper: Left arm, Back Assist Level: Touching or steadying assistance(Pt > 75%)  Function- Upper Body Dressing/Undressing What is the patient wearing?: Pull over shirt/dress Pull over shirt/dress - Perfomed by patient: Thread/unthread left sleeve, Put head through opening, Thread/unthread right sleeve, Pull shirt over trunk Pull over shirt/dress - Perfomed by helper: Pull shirt over trunk, Thread/unthread right sleeve Assist Level: Supervision or verbal cues Function - Lower Body Dressing/Undressing What is the patient wearing?: Underwear, Pants Position: Wheelchair/chair at sink Underwear - Performed by patient: Thread/unthread right underwear leg, Thread/unthread left underwear leg, Pull underwear up/down Underwear - Performed by helper: Pull underwear up/down Pants- Performed by patient: Thread/unthread right pants  leg, Thread/unthread left pants leg, Pull pants up/down, Fasten/unfasten pants Shoes - Performed by patient: Don/doff right shoe, Don/doff left shoe Shoes - Performed by helper: Fasten right, Fasten left TED Hose - Performed by helper: Don/doff right TED hose, Don/doff left TED hose Assist for footwear: Supervision/touching assist Assist for lower body dressing: Touching or steadying assistance (Pt > 75%)  Function - Toileting Toileting steps completed by patient: Adjust clothing prior to toileting, Performs perineal hygiene, Adjust clothing after toileting Toileting steps completed by helper: Adjust clothing prior to toileting, Performs perineal hygiene, Adjust clothing after toileting Toileting Assistive Devices: Grab bar or rail Assist level: Touching or steadying assistance (Pt.75%)  Function - Toilet Transfers Toilet transfer assistive device: Bedside commode Assist level to toilet: Moderate assist (Pt 50 - 74%/lift or lower) Assist level from toilet: Moderate assist (Pt 50 - 74%/lift or lower) Assist level to bedside commode (at bedside): Moderate assist (Pt 50 - 74%/lift or lower) Assist level from bedside commode (at bedside): Moderate assist (Pt  50 - 74%/lift or lower)  Function - Chair/bed transfer Chair/bed transfer method: Stand pivot Chair/bed transfer assist level: Touching or steadying assistance (Pt > 75%) Chair/bed transfer assistive device: Armrests, Walker Chair/bed transfer details: Verbal cues for sequencing, Verbal cues for technique, Verbal cues for precautions/safety, Manual facilitation for weight shifting, Manual facilitation for placement  Function - Locomotion: Wheelchair Will patient use wheelchair at discharge?: Yes Type: Manual Max wheelchair distance: 150 Assist Level: Supervision or verbal cues Assist Level: Supervision or verbal cues Wheel 150 feet activity did not occur: Safety/medical concerns Assist Level: Supervision or verbal cues Turns  around,maneuvers to table,bed, and toilet,negotiates 3% grade,maneuvers on rugs and over doorsills: No Function - Locomotion: Ambulation Assistive device: Walker-rolling, Orthosis Max distance: 150 Assist level: Supervision or verbal cues Assist level: Supervision or verbal cues Walk 50 feet with 2 turns activity did not occur: Safety/medical concerns Assist level: Supervision or verbal cues Walk 150 feet activity did not occur: Safety/medical concerns Assist level: Supervision or verbal cues Walk 10 feet on uneven surfaces activity did not occur: Safety/medical concerns  Function - Comprehension Comprehension: Auditory Comprehension assist level: Follows basic conversation/direction with no assist  Function - Expression Expression: Verbal Expression assist level: Expresses basic needs/ideas: With no assist  Function - Social Interaction Social Interaction assist level: Interacts appropriately 90% of the time - Needs monitoring or encouragement for participation or interaction.  Function - Problem Solving Problem solving assist level: Solves basic 75 - 89% of the time/requires cueing 10 - 24% of the time  Function - Memory Memory assist level: Recognizes or recalls 75 - 89% of the time/requires cueing 10 - 24% of the time Patient normally able to recall (first 3 days only): Current season, Location of own room, Staff names and faces, That he or she is in a hospital  Medical Problem List and Plan: 1.  Right side weakness/dysphagia secondary to left frontal lobe infarct complicated by respiratory failure.Continue therapy2.  DVT Prophylaxis/Anticoagulation: SCDs. Monitor for any signs of DVT 3. Pain Management: Tylenol as needed 4. Dysphagia/esophageal stricture. Status post gastrostomy tube 06/14/2015 per interventional radiology. Speech therapy follow-up. Plan outpatient EGD endoscopy for dilatation of stricture, swallowing dysfunction improving when comparing the modified barium  swallow performed 06/09/2015 versus 06/15/2015, Goal is to take adequate by mouth prior to discharge 5. Neuropsych: This patient is capable of making decisions on her own behalf. 6. Skin/Wound Care: Routine skin checks, gastrostomy tube site looks okay except has some dried blood. Will write for daily normal saline rinses 7. Fluids/Electrolytes/Nutrition: Routine I&O's with follow-up chemistries 8. ID. Strep pyogenes bacteremia. Followed by infectious disease. Continue ceftriaxone questions 6 week course and confirm with Dr. Drucilla Schmidt. Maintain contact precautions 9. Atrial flutter with RVR. Lopressor 12.5 mg twice a day, Blood pressures running low will reduce Lopressor to 6.25 mg twice a day 10. Rheumatoid arthritis/Sjorgens disease. Discuss resuming plaquenil and chronic prednisone.no active joint inflammation at the current time 11. Hyperglycemia secondary to chronic prednisone/tube feeds. Hemoglobin A1c 6.6. Check blood sugars while on tube feeds 12. Hyperlipidemia. Lipitor, patient has elevated liver function tests, we'll reduce Lipitor from 40 mg to 20 mg and recheck C met  in 1-2 weeks 13. Hypothyroidism. Resume Synthroid 14. Elevated LFTs, had recent gallbladder ultrasound which was negative. Cholelithiasis but no clinical evidence of cholecystitis 15. Epistaxis likely related to dry conditions with Lovenox plus Plavix, start saline nasal spray, check CBC in a.m. LOS (Days) 4 A FACE TO FACE EVALUATION WAS PERFORMED  Alysia Penna E 06/20/2015,  10:28 AM

## 2015-06-20 NOTE — Progress Notes (Signed)
Physical Therapy Session Note  Patient Details  Name: [name] [name] MRN: [id number] Date of Birth: [birth year]  Today's Date: 06/20/2015 PT Concurrent Time: 0915-0945 PT Concurrent Time Calculation (min): 30 min  Short Term Goals: Week 1:  PT Short Term Goal 1 (Week 1): Pt will perform all bed mobiltiy with S, including UE management PT Short Term Goal 2 (Week 1): Pt will perform WC<>bed transfers with Min A consistently PT Short Term Goal 3 (Week 1): Pt will ambulate x50' with RW and Min A PT Short Term Goal 4 (Week 1): Pt will perform 12 stairs with Min A  PT Short Term Goal 5 (Week 1): Pt will demonstrate static standing balance x5 min for functional tasks with close S  Skilled Therapeutic Interventions/Progress Updates:    Pt benefits from scap mobs and cues for activation to get increased shoulder horiz add AAROM and less compensatory thoracic rotation. Pt partially limited by concurrent setting, limiting time spent in high demand forced use situations. Pt would benefit from skilled individualized PT services to increase functional mobility.  Therapy Documentation Precautions:  Precautions Precautions: Fall Precaution Comments: R hemiplegia UE more impaired than LE Restrictions Weight Bearing Restrictions: No General: PT Amount of Missed Time (min): 15 Minutes PT Missed Treatment Reason: Other (Comment) (patient care that prevented pt from attending 1st 15 minutes of concurrent tx in gym) Vital Signs: Therapy Vitals Temp: 97.8 F (36.6 C) Temp Source: Oral Pulse Rate: 73 Resp: 17 BP: (!) 100/54 mmHg Patient Position (if appropriate): Lying Oxygen Therapy SpO2: 100 % O2 Device: Not Delivered Pain: Pain Assessment Pain Assessment: No/denies pain Mobility:  Transfers with Min Guard with cues for RUE attention and safety  Locomotion :   Min guard with RW and orthosis with cues for ECC control of Initial contact Other Treatments:  Pt educated on rehab plan, safety in  mobility, RUE support, and forced use. Scap mobs performed. Scap retraction, hip add isometrics, ankle pumps, heel raises, horiz abd of RUE AAROM 2x10. Pt performs transfers x10 in session. Pec stretch 2x30"  See Function Navigator for Current Functional Status.   Therapy/Group: Individual Therapy  Monia Pouch 06/20/2015, 5:21 PM

## 2015-06-21 ENCOUNTER — Inpatient Hospital Stay (HOSPITAL_COMMUNITY): Payer: BC Managed Care – PPO | Admitting: Occupational Therapy

## 2015-06-21 ENCOUNTER — Inpatient Hospital Stay (HOSPITAL_COMMUNITY): Payer: BC Managed Care – PPO | Admitting: Physical Therapy

## 2015-06-21 ENCOUNTER — Inpatient Hospital Stay (HOSPITAL_COMMUNITY): Payer: BC Managed Care – PPO | Admitting: Speech Pathology

## 2015-06-21 LAB — GLUCOSE, CAPILLARY
GLUCOSE-CAPILLARY: 125 mg/dL — AB (ref 65–99)
GLUCOSE-CAPILLARY: 140 mg/dL — AB (ref 65–99)
GLUCOSE-CAPILLARY: 132 mg/dL — AB (ref 65–99)
GLUCOSE-CAPILLARY: 153 mg/dL — AB (ref 65–99)
Glucose-Capillary: 114 mg/dL — ABNORMAL HIGH (ref 65–99)
Glucose-Capillary: 182 mg/dL — ABNORMAL HIGH (ref 65–99)
Glucose-Capillary: 94 mg/dL (ref 65–99)

## 2015-06-21 NOTE — Progress Notes (Signed)
Subjective/Complaints: Patient Jewett her grounds past. She only had one therapy yesterday. She is ready for more today. We discussed her swallowing and goals of by mouth intake by the time she discharges  Review of systems negative for chest pain, shortness of breath, nausea vomiting diarrhea constipation Objective: Vital Signs: Blood pressure 96/55, pulse 77, temperature 98.5 F (36.9 C), temperature source Oral, resp. rate 17, height _0  (1.549 m), weight 56.11 kg (123 lb 11.2 oz), SpO2 97 %. No results found. Results for orders placed or performed during the hospital encounter of 06/16/15 (from the past 72 hour(s))  Glucose, capillary     Status: Abnormal   Collection Time: 06/18/15 11:56 AM  Result Value Ref Range   Glucose-Capillary 173 (H) 65 - 99 mg/dL  Glucose, capillary     Status: Abnormal   Collection Time: 06/18/15  4:26 PM  Result Value Ref Range   Glucose-Capillary 155 (H) 65 - 99 mg/dL  Glucose, capillary     Status: Abnormal   Collection Time: 06/18/15  8:59 PM  Result Value Ref Range   Glucose-Capillary 130 (H) 65 - 99 mg/dL  Glucose, capillary     Status: Abnormal   Collection Time: 06/18/15 11:52 PM  Result Value Ref Range   Glucose-Capillary 132 (H) 65 - 99 mg/dL  Glucose, capillary     Status: Abnormal   Collection Time: 06/19/15  4:27 AM  Result Value Ref Range   Glucose-Capillary 140 (H) 65 - 99 mg/dL  Glucose, capillary     Status: Abnormal   Collection Time: 06/19/15  8:12 AM  Result Value Ref Range   Glucose-Capillary 144 (H) 65 - 99 mg/dL  Glucose, capillary     Status: Abnormal   Collection Time: 06/19/15 11:41 AM  Result Value Ref Range   Glucose-Capillary 154 (H) 65 - 99 mg/dL  Glucose, capillary     Status: Abnormal   Collection Time: 06/19/15  4:24 PM  Result Value Ref Range   Glucose-Capillary 164 (H) 65 - 99 mg/dL   Comment 1 Document in Chart   Glucose, capillary     Status: Abnormal   Collection Time: 06/19/15  8:20 PM  Result  Value Ref Range   Glucose-Capillary 117 (H) 65 - 99 mg/dL  Glucose, capillary     Status: Abnormal   Collection Time: 06/20/15 12:00 AM  Result Value Ref Range   Glucose-Capillary 140 (H) 65 - 99 mg/dL  Glucose, capillary     Status: Abnormal   Collection Time: 06/20/15  4:14 AM  Result Value Ref Range   Glucose-Capillary 135 (H) 65 - 99 mg/dL  Glucose, capillary     Status: Abnormal   Collection Time: 06/20/15  8:07 AM  Result Value Ref Range   Glucose-Capillary 130 (H) 65 - 99 mg/dL   Comment 1 Notify RN   Glucose, capillary     Status: Abnormal   Collection Time: 06/20/15 11:44 AM  Result Value Ref Range   Glucose-Capillary 169 (H) 65 - 99 mg/dL   Comment 1 Notify RN   Glucose, capillary     Status: Abnormal   Collection Time: 06/20/15  4:48 PM  Result Value Ref Range   Glucose-Capillary 135 (H) 65 - 99 mg/dL   Comment 1 Notify RN   Glucose, capillary     Status: Abnormal   Collection Time: 06/20/15  8:58 PM  Result Value Ref Range   Glucose-Capillary 136 (H) 65 - 99 mg/dL  Glucose, capillary  Status: None   Collection Time: 06/21/15 12:13 AM  Result Value Ref Range   Glucose-Capillary 94 65 - 99 mg/dL   Comment 1 Notify RN   Glucose, capillary     Status: Abnormal   Collection Time: 06/21/15  4:40 AM  Result Value Ref Range   Glucose-Capillary 125 (H) 65 - 99 mg/dL  Glucose, capillary     Status: Abnormal   Collection Time: 06/21/15  8:15 AM  Result Value Ref Range   Glucose-Capillary 140 (H) 65 - 99 mg/dL      General: No acute distress Mood and affect are appropriate Heart: Regular rate and rhythm no rubs murmurs or extra sounds Lungs: Clear to auscultation, breathing unlabored, no rales or wheezes, decreased breath sounds at bases Abdomen: Positive bowel sounds, soft nontender to palpation, nondistended Extremities: No clubbing, cyanosis, or edema Skin: No evidence of breakdown, no evidence of rash Neurologic: Cranial nerves II through XII intact, motor  strength is 5/5 in left deltoid, bicep, tricep, grip, hip flexor, knee extensors, ankle dorsiflexor and plantar flexor, 3 minus right deltoid, bicep, tricep, grip,, 4 minus right hip flexor and knee extensor and 2 minus ankle dorsiflexor Sensory exam normal sensation to light touch in bilateral upper and lower extremities Oriented to person place and time   Musculoskeletal: Full passive range  of motion in all 4 extremities. No joint swelling   Assessment/Plan: 1. Functional deficits secondary to Right side weakness/dysphagia secondary to left frontal lobe infarct complicated by respiratory failure from septic shock. which require 3+ hours per day of interdisciplinary therapy in a comprehensive inpatient rehab setting. Physiatrist is providing close team supervision and 24 hour management of active medical problems listed below. Physiatrist and rehab team continue to assess barriers to discharge/monitor patient progress toward functional and medical goals. FIM: Function - Bathing Position: Wheelchair/chair at sink Body parts bathed by patient: Right arm, Chest, Abdomen, Right upper leg, Left upper leg, Right lower leg, Left lower leg, Front perineal area, Buttocks Body parts bathed by helper: Left arm, Back Assist Level: Touching or steadying assistance(Pt > 75%)  Function- Upper Body Dressing/Undressing What is the patient wearing?: Pull over shirt/dress Pull over shirt/dress - Perfomed by patient: Thread/unthread left sleeve, Put head through opening, Thread/unthread right sleeve, Pull shirt over trunk Pull over shirt/dress - Perfomed by helper: Pull shirt over trunk, Thread/unthread right sleeve Assist Level: Supervision or verbal cues Function - Lower Body Dressing/Undressing What is the patient wearing?: Underwear, Pants Position: Wheelchair/chair at sink Underwear - Performed by patient: Thread/unthread right underwear leg, Thread/unthread left underwear leg, Pull underwear  up/down Underwear - Performed by helper: Pull underwear up/down Pants- Performed by patient: Thread/unthread right pants leg, Thread/unthread left pants leg, Pull pants up/down, Fasten/unfasten pants Shoes - Performed by patient: Don/doff right shoe, Don/doff left shoe Shoes - Performed by helper: Fasten right, Fasten left TED Hose - Performed by helper: Don/doff right TED hose, Don/doff left TED hose Assist for footwear: Supervision/touching assist Assist for lower body dressing: Touching or steadying assistance (Pt > 75%)  Function - Toileting Toileting steps completed by patient: Adjust clothing prior to toileting, Performs perineal hygiene, Adjust clothing after toileting Toileting steps completed by helper: Adjust clothing prior to toileting, Performs perineal hygiene, Adjust clothing after toileting Toileting Assistive Devices: Grab bar or rail Assist level: Touching or steadying assistance (Pt.75%)  Function - Toilet Transfers Toilet transfer assistive device: Bedside commode Assist level to toilet: Moderate assist (Pt 50 - 74%/lift or lower) Assist level from toilet:  Moderate assist (Pt 50 - 74%/lift or lower) Assist level to bedside commode (at bedside): Moderate assist (Pt 50 - 74%/lift or lower) Assist level from bedside commode (at bedside): Moderate assist (Pt 50 - 74%/lift or lower)  Function - Chair/bed transfer Chair/bed transfer method: Stand pivot Chair/bed transfer assist level: Touching or steadying assistance (Pt > 75%) Chair/bed transfer assistive device: Armrests, Walker Chair/bed transfer details: Verbal cues for sequencing, Verbal cues for technique, Verbal cues for precautions/safety, Manual facilitation for weight shifting, Manual facilitation for placement  Function - Locomotion: Wheelchair Will patient use wheelchair at discharge?: Yes Type: Manual Max wheelchair distance: 150 Assist Level: Supervision or verbal cues Assist Level: Supervision or verbal  cues Wheel 150 feet activity did not occur: Safety/medical concerns Assist Level: Supervision or verbal cues Turns around,maneuvers to table,bed, and toilet,negotiates 3% grade,maneuvers on rugs and over doorsills: No Function - Locomotion: Ambulation Assistive device: Walker-rolling, Orthosis Max distance: 150 Assist level: Supervision or verbal cues Assist level: Supervision or verbal cues Walk 50 feet with 2 turns activity did not occur: Safety/medical concerns Assist level: Supervision or verbal cues Walk 150 feet activity did not occur: Safety/medical concerns Assist level: Supervision or verbal cues Walk 10 feet on uneven surfaces activity did not occur: Safety/medical concerns  Function - Comprehension Comprehension: Auditory Comprehension assist level: Follows basic conversation/direction with no assist  Function - Expression Expression: Verbal Expression assist level: Expresses basic needs/ideas: With no assist  Function - Social Interaction Social Interaction assist level: Interacts appropriately 90% of the time - Needs monitoring or encouragement for participation or interaction.  Function - Problem Solving Problem solving assist level: Solves basic 75 - 89% of the time/requires cueing 10 - 24% of the time  Function - Memory Memory assist level: Recognizes or recalls 75 - 89% of the time/requires cueing 10 - 24% of the time Patient normally able to recall (first 3 days only): Current season, Location of own room, Staff names and faces, That he or she is in a hospital  Medical Problem List and Plan: 1.  Right side weakness/dysphagia secondary to left frontal lobe infarct complicated by respiratory failure.continue PT OT and speech therapy2.  DVT Prophylaxis/Anticoagulation: SCDs. Monitor for any signs of DVT 3. Pain Management: Tylenol as needed 4. Dysphagia/esophageal stricture. Status post gastrostomy tube 06/14/2015 per interventional radiology. Speech therapy  follow-up. Plan outpatient EGD endoscopy for dilatation of stricture, swallowing dysfunction improving when comparing the modified barium swallow performed 06/09/2015 versus 06/15/2015, Goal is to take adequate by mouth prior to discharge, will discussed with speech therapy this week mom may need repeat modified 5. Neuropsych: This patient is capable of making decisions on her own behalf. 6. Skin/Wound Care: Routine skin checks, gastrostomy tube site looks okay except has some dried blood. Will write for daily normal saline rinses 7. Fluids/Electrolytes/Nutrition: Routine I&O's with follow-up chemistries 8. ID. Strep pyogenes bacteremia. Followed by infectious disease. Continue ceftriaxone questions 6 week course and confirm with Dr. Drucilla Schmidt. Maintain contact precautions 9. Atrial flutter with RVR. Lopressor 6.25 mg twice a day,, blood pressure still running low on 6.25 Will monitor over the next couple days.10. Rheumatoid arthritis/Sjorgens disease. Discuss resuming plaquenil and chronic prednisone.no active joint inflammation at the current time 11. Hyperglycemia secondary to chronic prednisone/tube feeds. Hemoglobin A1c 6.6. Check blood sugars while on tube feeds 12. Hyperlipidemia. Lipitor, patient has elevated liver function tests, we'll reduce Lipitor from 40 mg to 20 mg and recheck C met  in 1-2 weeks 13. Hypothyroidism. Resume Synthroid 14.  Elevated LFTs, had recent gallbladder ultrasound which was negative. Cholelithiasis but no clinical evidence of cholecystitis 15. Epistaxis resolved continue to monitorLOS (Days) 5 A FACE TO FACE EVALUATION WAS PERFORMED  Daire Okimoto E 06/21/2015, 9:00 AM

## 2015-06-21 NOTE — Progress Notes (Signed)
Physical Therapy Note  Patient Details  Name: [name] [name] MRN: [id number] Date of Birth: [birth year] Today's Date: 06/21/2015    Time: 1100-1130 30 minutes  1:1 No c/o pain. Gait with RW to bathroom with min A for transfers and gait, min A for standing balance with up/down pants and for hygiene and washing hands. Pt requires min cues for hand placement with sit <> stand.  Gait in controlled environment 150' x 2 with RW with min steadying assist, pt with fast cadence.  Sit <> stand and LE exercises with focus on eccentric control, especially when fatigued as pt tends to "plop" when she is tired.  Pt improved with repetition and cuing for eccentric control.   DONAWERTH,KAREN 06/21/2015, 11:29 AM

## 2015-06-21 NOTE — Progress Notes (Signed)
Speech Language Pathology Daily Session Note  Patient Details  Name: [name] [name] MRN: [id number] Date of Birth: 21-Sep-1955  Today's Date: 06/21/2015 SLP Individual Time: 1003-1100 SLP Individual Time Calculation (min): 57 min  Short Term Goals: Week 1: SLP Short Term Goal 1 (Week 1): Pt will consume therapeutic trials of nectar thick liquids via teaspoon with minimal overt s/s of aspiration and supervision verbal cues for use of swallowing precautions over three consecutive sessions prior to repeat objective swallow study  SLP Short Term Goal 2 (Week 1): Pt will complete semi-complex functional home management tasks for >80% accuracy wtih supervision verbal cues for working memory.   SLP Short Term Goal 3 (Week 1): Pt will return demonstration of pharyngeal strengthening exercises for 25 repetitions each with supervision verbal cues SLP Short Term Goal 4 (Week 1): Pt will utilize compensatory memory strategies to facilitate recall of complex information wtih supervision verbal cues.    Skilled Therapeutic Interventions:  Pt was seen for skilled ST targeting goals for dysphagia and cognition.  Pt consumed trials of nectar thick liquids via teaspoons and cup sips to continue working towards re-initiation of PO diet. Pt demonstrated x1 delayed cough following large, consecutive boluses of thickened liquids via cup sips but no other overt s/s of aspiration were evident across trials.  Pt also returned demonstration of pharyngeal strengthening exercises x25 each for a total of 75 repetitions with mod I.  Recommend repeat MBS at next available appointment given steady improvements noted at bedside to objectively determine readiness for re-initiation of PO diet.  Pt in agreement.  SLP also facilitated the session with a novel card game targeting use of memory compensatory aids.  Pt generated word-picture associations with mod I to aid in delayed recall of new information during a categorical naming task.    Pt utilized memory aids with supervision cues during the abovementioned task to recall 4 targeted categories.  Pt was then able to name specific category members with supervision cues for recall of previously named items.  Pt reports gradual improvement in cognitive function from initial eval.  Pt left in wheelchair with call bell within reach.  Continue per current plan of care.    Function:  Eating Eating   Modified Consistency Diet:  (Trials of nectar thick liquids with SLP ) Eating Assist Level: More than reasonable amount of time           Cognition Comprehension Comprehension assist level: Follows basic conversation/direction with no assist  Expression   Expression assist level: Expresses basic needs/ideas: With no assist  Social Interaction Social Interaction assist level: Interacts appropriately 90% of the time - Needs monitoring or encouragement for participation or interaction.  Problem Solving Problem solving assist level: Solves basic 90% of the time/requires cueing < 10% of the time  Memory Memory assist level: Recognizes or recalls 75 - 89% of the time/requires cueing 10 - 24% of the time    Pain Pain Assessment Pain Assessment: No/denies pain  Therapy/Group: Individual Therapy  [name], Selinda Orion 06/21/2015, 12:44 PM

## 2015-06-21 NOTE — Progress Notes (Signed)
Patient enrolled in research study-per MD ok to draw blood for research study. Blood drawn and given to [place] RN [facility]

## 2015-06-21 NOTE — Research (Cosign Needed)
[id number]:  Patient seen by Dr. Titus Mould, MD for follow up visit (per protocol).  See written documentation of assessment in study binder.  Urine specimen collected and blood specimen collection ordered, per protocol.  Spouse at bedside with patient.    Patient has not yet read informed consent form, though has been updated by study staff (see prior documentation) and spouse regarding study.  Patient gave verbal assent to continue with visit and study.  However, study staff is to provide patient with a blank ICF to read as able.  Fatigue remains a limiting factor but patient verbalized intent to review informed consent form with spouse in the next day or two.  Patient alert and oriented, able to make decisions.  This RN placed informed consent form copy in room for patient to review.  Doreatha Martin, RN

## 2015-06-21 NOTE — Progress Notes (Signed)
Occupational Therapy Session Note  Patient Details  Name: [name] [name] MRN: [id number] Date of Birth: 1955-10-26  Today's Date: 06/21/2015 OT Individual Time: 0902-1000 OT Individual Time Calculation (min): 58 min    Short Term Goals: Week 1:  OT Short Term Goal 1 (Week 1): Pt will perform all bathing sit to stand with min assist 3 consecutive sessions. OT Short Term Goal 2 (Week 1): Pt will donn pullover shirt with supervison following hemi techniques OT Short Term Goal 3 (Week 1): Pt will complete LB dressing sit to stand with min assist 3 consecutive sessions. OT Short Term Goal 4 (Week 1): Pt will complete toilet transfers with min guard assist using LRAD. OT Short Term Goal 5 (Week 1): Pt will use the RUE for bathing with min assist to wash the LUE.    Skilled Therapeutic Interventions/Progress Updates:    pt ambulated with hand held assist to the bathroom toilet and around the room with min steady assist.  She was able to complete toileting tasks with increased time, using the grab bar on the right side for weightbearing through the RUE in standing.  Gathered clothing with min assist before transferring to the wheelchair at the sink for bathing and dressing.  Used wash mit over the RUE to wash 75% of her body with mod facilitation from therapist.  She was able to complete LB dressing with min assist overall and therapist incorporated shoe buttons as modification for tying.  Min assist for donning pullover shirt as pt did not follow hemi dressing technique and attempted donning over her RUE and then her head without placing the LUE in.  Assistance needed placing LUE secondary to PICC line.  Pt left at sink with SLP to finish oral hygiene and grooming tasks.    Therapy Documentation Precautions:  Precautions Precautions: Fall Precaution Comments: R hemiplegia UE more impaired than LE Restrictions Weight Bearing Restrictions: No  Pain: Pain Assessment Pain Assessment: No/denies  pain ADL: See Function Navigator for Current Functional Status.   Therapy/Group: Individual Therapy  Ayelet Gruenewald OTR/L 06/21/2015, 5:31 PM

## 2015-06-21 NOTE — Progress Notes (Signed)
Physical Therapy Session Note  Patient Details  Name: [name] [name] MRN: [id number] Date of Birth: 1955-12-27  Today's Date: 06/21/2015 PT Individual Time: 1310-1405 PT Individual Time Calculation (min): 55 min   Short Term Goals: Week 1:  PT Short Term Goal 1 (Week 1): Pt will perform all bed mobiltiy with S, including UE management PT Short Term Goal 2 (Week 1): Pt will perform WC<>bed transfers with Min A consistently PT Short Term Goal 3 (Week 1): Pt will ambulate x50' with RW and Min A PT Short Term Goal 4 (Week 1): Pt will perform 12 stairs with Min A  PT Short Term Goal 5 (Week 1): Pt will demonstrate static standing balance x5 min for functional tasks with close S  Skilled Therapeutic Interventions/Progress Updates:   Pt received in w/c on tube feed; pt disconnected from tube feed by RN and abdominal binder applied in sitting for BP management.  Pt denies pain.  Performed gait training with RW but without use of hand orthosis to promote increased attention to and activation of RUE grip during gait in controlled environment with min A x 150' with verbal cues for navigation and safety.  Also performed gait training x 75' x 2 in controlled environment without use of AD with LUE supporting RUE and min A for balance.  Performed stair negotiation training up/down 4 stairs x 2 reps without rails with focus on dynamic balance while weight shifting laterally and forwards with verbal cues for safe step to sequence ascending with LLE and descending with RLE and mod A for balance.  Performed floor <> furniture transfer training demonstrating sequence to pt; pt return demonstrated transfer with mod A for placement and safe WB through RUE, sequencing, trunk control, balance and lifting assistance from half kneeling > stand.  Performed bed mobility and transfers off of tall flat bed to simulate bed at home with pt performing scooting onto tall bed with min A and sit > supine with supervision but min A for  supine > sit for trunk control and weight shifting.  Pt returned to room and transferred to hospital bed to rest with min A.  Pt left with all items within reach.  Therapy Documentation Precautions:  Precautions Precautions: Fall Precaution Comments: R hemiplegia UE more impaired than LE Restrictions Weight Bearing Restrictions: No Vital Signs: Therapy Vitals Temp: 98 F (36.7 C) Temp Source: Oral Pulse Rate: 80 Resp: 18 BP: 104/61 mmHg Patient Position (if appropriate): Sitting Oxygen Therapy SpO2: 100 % O2 Device: Not Delivered Pain: Pain Assessment Pain Assessment: No/denies pain  See Function Navigator for Current Functional Status.   Therapy/Group: Individual Therapy  Raylene Everts [facility] 06/21/2015, 3:33 PM

## 2015-06-22 ENCOUNTER — Inpatient Hospital Stay (HOSPITAL_COMMUNITY): Payer: BC Managed Care – PPO | Admitting: Physical Therapy

## 2015-06-22 ENCOUNTER — Inpatient Hospital Stay (HOSPITAL_COMMUNITY): Payer: BC Managed Care – PPO

## 2015-06-22 ENCOUNTER — Inpatient Hospital Stay (HOSPITAL_COMMUNITY): Payer: BC Managed Care – PPO | Admitting: Speech Pathology

## 2015-06-22 ENCOUNTER — Inpatient Hospital Stay (HOSPITAL_COMMUNITY): Payer: BC Managed Care – PPO | Admitting: Occupational Therapy

## 2015-06-22 ENCOUNTER — Encounter (HOSPITAL_COMMUNITY): Payer: BC Managed Care – PPO | Admitting: Speech Pathology

## 2015-06-22 LAB — GLUCOSE, CAPILLARY
GLUCOSE-CAPILLARY: 174 mg/dL — AB (ref 65–99)
GLUCOSE-CAPILLARY: 121 mg/dL — AB (ref 65–99)
GLUCOSE-CAPILLARY: 145 mg/dL — AB (ref 65–99)
Glucose-Capillary: 112 mg/dL — ABNORMAL HIGH (ref 65–99)
Glucose-Capillary: 113 mg/dL — ABNORMAL HIGH (ref 65–99)
Glucose-Capillary: 132 mg/dL — ABNORMAL HIGH (ref 65–99)

## 2015-06-22 NOTE — Progress Notes (Signed)
Speech Language Pathology Note  Patient Details  Name: Tashawn Gressel MRN: [id number] Date of Birth: 01/17/1956 Today's Date: 06/22/2015  MBSS complete. Full report located under chart review in imaging section.    Zephyr Sausedo, Selinda Orion 06/22/2015, 11:29 AM

## 2015-06-22 NOTE — Progress Notes (Signed)
Subjective/Complaints: According to physical therapy she is at a supervision level. She still is requiring PEG feeds, nothing by mouth but is gaining a modified barium swallow study today. Still requiring some assistance for ADLs based on right upper extremity weakness.  Review of systems negative for chest pain, shortness of breath, nausea vomiting diarrhea constipation Objective: Vital Signs: Blood pressure 109/53, pulse 81, temperature 98.1 F (36.7 C), temperature source Oral, resp. rate 18, height 5' 1"  (1.549 m), weight 56.8 kg (125 lb 3.5 oz), SpO2 95 %. No results found. Results for orders placed or performed during the hospital encounter of 06/16/15 (from the past 72 hour(s))  Glucose, capillary     Status: Abnormal   Collection Time: 06/19/15 11:41 AM  Result Value Ref Range   Glucose-Capillary 154 (H) 65 - 99 mg/dL  Glucose, capillary     Status: Abnormal   Collection Time: 06/19/15  4:24 PM  Result Value Ref Range   Glucose-Capillary 164 (H) 65 - 99 mg/dL   Comment 1 Document in Chart   Glucose, capillary     Status: Abnormal   Collection Time: 06/19/15  8:20 PM  Result Value Ref Range   Glucose-Capillary 117 (H) 65 - 99 mg/dL  Glucose, capillary     Status: Abnormal   Collection Time: 06/20/15 12:00 AM  Result Value Ref Range   Glucose-Capillary 140 (H) 65 - 99 mg/dL  Glucose, capillary     Status: Abnormal   Collection Time: 06/20/15  4:14 AM  Result Value Ref Range   Glucose-Capillary 135 (H) 65 - 99 mg/dL  Glucose, capillary     Status: Abnormal   Collection Time: 06/20/15  8:07 AM  Result Value Ref Range   Glucose-Capillary 130 (H) 65 - 99 mg/dL   Comment 1 Notify RN   Glucose, capillary     Status: Abnormal   Collection Time: 06/20/15 11:44 AM  Result Value Ref Range   Glucose-Capillary 169 (H) 65 - 99 mg/dL   Comment 1 Notify RN   Glucose, capillary     Status: Abnormal   Collection Time: 06/20/15  4:48 PM  Result Value Ref Range   Glucose-Capillary  135 (H) 65 - 99 mg/dL   Comment 1 Notify RN   Glucose, capillary     Status: Abnormal   Collection Time: 06/20/15  8:58 PM  Result Value Ref Range   Glucose-Capillary 136 (H) 65 - 99 mg/dL  Glucose, capillary     Status: None   Collection Time: 06/21/15 12:13 AM  Result Value Ref Range   Glucose-Capillary 94 65 - 99 mg/dL   Comment 1 Notify RN   Glucose, capillary     Status: Abnormal   Collection Time: 06/21/15  4:40 AM  Result Value Ref Range   Glucose-Capillary 125 (H) 65 - 99 mg/dL  Glucose, capillary     Status: Abnormal   Collection Time: 06/21/15  8:15 AM  Result Value Ref Range   Glucose-Capillary 140 (H) 65 - 99 mg/dL  Glucose, capillary     Status: Abnormal   Collection Time: 06/21/15 11:48 AM  Result Value Ref Range   Glucose-Capillary 182 (H) 65 - 99 mg/dL  Glucose, capillary     Status: Abnormal   Collection Time: 06/21/15  2:21 PM  Result Value Ref Range   Glucose-Capillary 132 (H) 65 - 99 mg/dL  Glucose, capillary     Status: Abnormal   Collection Time: 06/21/15  4:13 PM  Result Value Ref Range  Glucose-Capillary 153 (H) 65 - 99 mg/dL  Glucose, capillary     Status: Abnormal   Collection Time: 06/21/15  7:52 PM  Result Value Ref Range   Glucose-Capillary 114 (H) 65 - 99 mg/dL  Glucose, capillary     Status: Abnormal   Collection Time: 06/21/15 11:55 PM  Result Value Ref Range   Glucose-Capillary 113 (H) 65 - 99 mg/dL  Glucose, capillary     Status: Abnormal   Collection Time: 06/22/15  4:24 AM  Result Value Ref Range   Glucose-Capillary 112 (H) 65 - 99 mg/dL  Glucose, capillary     Status: Abnormal   Collection Time: 06/22/15  8:27 AM  Result Value Ref Range   Glucose-Capillary 174 (H) 65 - 99 mg/dL      General: No acute distress Mood and affect are appropriate Heart: Regular rate and rhythm no rubs murmurs or extra sounds Lungs: Clear to auscultation, breathing unlabored, no rales or wheezes, decreased breath sounds at bases Abdomen: Positive  bowel sounds, soft nontender to palpation, nondistended Extremities: No clubbing, cyanosis, or edema Skin: No evidence of breakdown, no evidence of rash Neurologic: Cranial nerves II through XII intact, motor strength is 5/5 in left deltoid, bicep, tricep, grip, hip flexor, knee extensors, ankle dorsiflexor and plantar flexor, 3 minus right deltoid, bicep, tricep, grip,, 4 minus right hip flexor and knee extensor and 2 minus ankle dorsiflexor Sensory exam normal sensation to light touch in bilateral upper and lower extremities Oriented to person place and time   Musculoskeletal: Full passive range  of motion in all 4 extremities. No joint swelling   Assessment/Plan: 1. Functional deficits secondary to Right side weakness/dysphagia secondary to left frontal lobe infarct complicated by respiratory failure from septic shock. which require 3+ hours per day of interdisciplinary therapy in a comprehensive inpatient rehab setting. Physiatrist is providing close team supervision and 24 hour management of active medical problems listed below. Physiatrist and rehab team continue to assess barriers to discharge/monitor patient progress toward functional and medical goals. FIM: Function - Bathing Position: Wheelchair/chair at sink Body parts bathed by patient: Right arm, Chest, Abdomen, Right upper leg, Left upper leg, Right lower leg, Left lower leg, Front perineal area, Buttocks Body parts bathed by helper: Left arm, Back Assist Level: Touching or steadying assistance(Pt > 75%)  Function- Upper Body Dressing/Undressing What is the patient wearing?: Pull over shirt/dress Pull over shirt/dress - Perfomed by patient: Thread/unthread right sleeve, Put head through opening, Pull shirt over trunk Pull over shirt/dress - Perfomed by helper: Thread/unthread left sleeve Assist Level: Supervision or verbal cues Function - Lower Body Dressing/Undressing What is the patient wearing?: Underwear, Shoes, [employer],  Pants Position: Wheelchair/chair at [employer] - Performed by patient: Thread/unthread right underwear leg, Thread/unthread left underwear leg, Pull underwear up/down Underwear - Performed by helper: Pull underwear up/down Pants- Performed by patient: Thread/unthread right pants leg, Thread/unthread left pants leg, Pull pants up/down Shoes - Performed by patient: Don/doff right shoe, Don/doff left shoe Shoes - Performed by helper: Fasten right, Fasten left TED Hose - Performed by helper: Don/doff right TED hose, Don/doff left TED hose Assist for footwear: Supervision/touching assist Assist for lower body dressing: Touching or steadying assistance (Pt > 75%)  Function - Toileting Toileting steps completed by patient: Adjust clothing after toileting Toileting steps completed by helper: Adjust clothing prior to toileting, Performs perineal hygiene Toileting Assistive Devices: Grab bar or rail Assist level: Touching or steadying assistance (Pt.75%)  Function - Air cabin crew  transfer assistive device: Grab bar Assist level to toilet: Touching or steadying assistance (Pt > 75%) Assist level from toilet: Touching or steadying assistance (Pt > 75%) Assist level to bedside commode (at bedside): Touching or steadying assistance (Pt > 75%) Assist level from bedside commode (at bedside): Touching or steadying assistance (Pt > 75%)  Function - Chair/bed transfer Chair/bed transfer method: Squat pivot, Ambulatory Chair/bed transfer assist level: Touching or steadying assistance (Pt > 75%) Chair/bed transfer assistive device: Walker Chair/bed transfer details: Verbal cues for sequencing, Verbal cues for technique, Verbal cues for precautions/safety, Manual facilitation for weight shifting, Manual facilitation for placement  Function - Locomotion: Wheelchair Will patient use wheelchair at discharge?: No Type: Manual Max wheelchair distance: 150 Assist Level: Supervision or verbal  cues Assist Level: Supervision or verbal cues Wheel 150 feet activity did not occur: Safety/medical concerns Assist Level: Supervision or verbal cues Turns around,maneuvers to table,bed, and toilet,negotiates 3% grade,maneuvers on rugs and over doorsills: No Function - Locomotion: Ambulation Assistive device: Walker-rolling Max distance: 150 Assist level: Touching or steadying assistance (Pt > 75%) Assist level: Touching or steadying assistance (Pt > 75%) Walk 50 feet with 2 turns activity did not occur: Safety/medical concerns Assist level: Touching or steadying assistance (Pt > 75%) Walk 150 feet activity did not occur: Safety/medical concerns Assist level: Touching or steadying assistance (Pt > 75%) Walk 10 feet on uneven surfaces activity did not occur: Safety/medical concerns Assist level: Touching or steadying assistance (Pt > 75%)  Function - Comprehension Comprehension: Auditory Comprehension assist level: Follows basic conversation/direction with no assist  Function - Expression Expression: Verbal Expression assist level: Expresses basic needs/ideas: With extra time/assistive device  Function - Social Interaction Social Interaction assist level: Interacts appropriately 90% of the time - Needs monitoring or encouragement for participation or interaction.  Function - Problem Solving Problem solving assist level: Solves basic 90% of the time/requires cueing < 10% of the time  Function - Memory Memory assist level: Recognizes or recalls 75 - 89% of the time/requires cueing 10 - 24% of the time Patient normally able to recall (first 3 days only): Current season, Location of own room, Staff names and faces, That he or she is in a hospital  Medical Problem List and Plan: 1.  Right side weakness/dysphagia secondary to left frontal lobe infarct complicated by respiratory failure, team conference in the morning  2.  DVT Prophylaxis/Anticoagulation: SCDs. Monitor for any signs of  DVT 3. Pain Management: Tylenol as needed 4. Dysphagia/esophageal stricture. Status post gastrostomy tube 06/14/2015 per interventional radiology. Speech therapy follow-up. Plan outpatient EGD endoscopy for dilatation of stricture, swallowing dysfunction improving when comparing the modified barium swallow performed 06/09/2015 versus 06/15/2015, repeat study 3/7 pending5. Neuropsych: This patient is capable of making decisions on her own behalf. 6. Skin/Wound Care: Routine skin checks, gastrostomy tube site looks okay except has some dried blood. Will write for daily normal saline rinses 7. Fluids/Electrolytes/Nutrition: Routine I&O's with follow-up chemistries 8. ID. Strep pyogenes bacteremia. Followed by infectious disease. Continue ceftriaxone questions 6 week course and confirm with Dr. Drucilla Schmidt. Maintain contact precautions 9. Atrial flutter with RVR. Lopressor 6.25 mg twice a day,, systolic blood pressures stable between 94 and 109, pulse stable around 80 10. Rheumatoid arthritis/Sjorgens disease. Discuss resuming plaquenil and chronic prednisone.no active joint inflammation at the current time 11. Hyperglycemia secondary to chronic prednisone/tube feeds. Hemoglobin A1c 6.6. Check blood sugars while on tube feeds 12. Hyperlipidemia. Lipitor, patient has elevated liver function tests, we'll reduce Lipitor from 40  mg to 20 mg and recheck C met  in 1-2 weeks 13. Hypothyroidism. Resume Synthroid 14. Elevated LFTs, had recent gallbladder ultrasound which was negative. Cholelithiasis but no clinical evidence of cholecystitis 15. Epistaxis resolved continue to monitorLOS (Days) 6 A FACE TO FACE EVALUATION WAS PERFORMED  Ladesha Pacini E 06/22/2015, 8:54 AM

## 2015-06-22 NOTE — Progress Notes (Signed)
Speech Language Pathology Daily Session Note  Patient Details  Name: [name] [name] MRN: [id number] Date of Birth: 1955/08/05  Today's Date: 06/22/2015 SLP Individual Time: 1310-1340 SLP Individual Time Calculation (min): 30 min  Short Term Goals: Week 1: SLP Short Term Goal 1 (Week 1): Pt will consume therapeutic trials of nectar thick liquids via teaspoon with minimal overt s/s of aspiration and supervision verbal cues for use of swallowing precautions over three consecutive sessions prior to repeat objective swallow study  SLP Short Term Goal 2 (Week 1): Pt will complete semi-complex functional home management tasks for >80% accuracy wtih supervision verbal cues for working memory.   SLP Short Term Goal 3 (Week 1): Pt will return demonstration of pharyngeal strengthening exercises for 25 repetitions each with supervision verbal cues SLP Short Term Goal 4 (Week 1): Pt will utilize compensatory memory strategies to facilitate recall of complex information wtih supervision verbal cues.    Skilled Therapeutic Interventions: Pt seen for skilled dysphagia therapy following implementation of Dys 2 diet with nectar thick liquids after MBSS this a.m. Pt was able to provide all swallowing precautions via verbal review prior to beginning meal. Pt able to demonstrate compliance with all swallow precautions with approximately 90%acc at mod I  level. Only occasional verbal cue required for swallow s/p cough. Pt appeared quite anxious during meal and we discussed importance of focusing on postive implementation of swallow precautions and strong swallow effort vs risk of difficulty. Pt was somewhat receptive to need for mental shift. No s/s aspiration noted with limited amounts of Dys 2/nectar tray.    Function:  Eating Eating   Modified Consistency Diet: Yes Eating Assist Level: More than reasonable amount of time;Supervision or verbal cues           Cognition Comprehension Comprehension assist level:  Follows basic conversation/direction with no assist  Expression   Expression assist level: Expresses basic needs/ideas: With extra time/assistive device  Social Interaction Social Interaction assist level: Interacts appropriately 90% of the time - Needs monitoring or encouragement for participation or interaction.  Problem Solving Problem solving assist level: Solves basic 90% of the time/requires cueing < 10% of the time  Memory Memory assist level: Recognizes or recalls 90% of the time/requires cueing < 10% of the time    Pain Pain Assessment Pain Assessment: No/denies pain  Therapy/Group: Individual Therapy  [name] 06/22/2015, 3:18 PM

## 2015-06-22 NOTE — Progress Notes (Signed)
Nutrition Follow-up  DOCUMENTATION CODES:   Not applicable  INTERVENTION:  Continue Jevity 1.2 formula via PEG at goal rate of 70 ml/hr x 20 hours (may hold TF up to 4 hours for therapy) to provide 1680 kcal, 78 grams of protein, and 1134 ml of free water.   Continue free water flushes of 100 ml QID.   RD to adjust tube feeding when po intake at meals improve.  NUTRITION DIAGNOSIS:   Inadequate oral intake related to inability to eat as evidenced by NPO status; just advanced to dysphagia 2 diet with nectar thick liquids; ongoing  GOAL:   Patient will meet greater than or equal to 90% of their needs; met  MONITOR:   TF tolerance, Weight trends, Labs, I & O's, Skin  REASON FOR ASSESSMENT:   Consult Enteral/tube feeding initiation and management  ASSESSMENT:   60 y.o.right handed female with history of Sjogrens disease/rheumatoid arthritis maintained on plaquenil and Prednisone, noted history of seizure but not on antiepileptic meds and dysphagia.  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 Echocardiogram with ejection fraction of 65% no wall motion abnormalities. 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  Modified barium swallow 06/09/2015 shows severe pharyngeal dysphagia as well as esophageal stricture. Patient underwent gastrostomy tube per interventional radiology 06/14/2015.  Pt underwent MBS this AM. Diet has been advanced to a dysphagia 2 diet with nectar thick liquids. First meal scheduled at 1pm today with speech therapy. Pt does report hunger during time of visit. Since po intake meal is unknown at this time, RD to continue with current tube feeding orders to ensure adequate nutrition needs are met. RD to adjust tube feeding once po intake improved.   Diet Order:  DIET DYS 2 Room service appropriate?: Yes; Fluid consistency:: Nectar Thick  Skin:   (Incision on abdomen)  Last BM:  3/7  Height:   Ht  Readings from Last 1 Encounters:  06/16/15 _0  (1.549 m)    Weight:   Wt Readings from Last 1 Encounters:  06/22/15 125 lb 3.5 oz (56.8 kg)    Ideal Body Weight:  47.7 kg  BMI:  Body mass index is 23.67 kg/(m^2).  Estimated Nutritional Needs:   Kcal:  1650-1850  Protein:  70-80 grams  Fluid:  1.6  - 1.8 L/day  EDUCATION NEEDS:   No education needs identified at this time  Corrin Parker, MS, RD, LDN Pager # [phone] After hours/ weekend pager # [phone]

## 2015-06-22 NOTE — Progress Notes (Signed)
Physical Therapy Note  Patient Details  Name: [name] [name] MRN: [id number] Date of Birth: [birth year] Today's Date: 06/22/2015    Time: 730-825 55 minutes  1:1 No c/o pain.  Pt performed supine <> sit with supervision from hospital bed. Practiced simulated bed height of 28'' with supervision.  Gait 150' x 2 with min guard with RW without R wrist splint, pt able to attend to R hand and keep it on RW during gait.  Stair negotiation 8 steps x 2 with 2 handrails.  Pt with increased SOB, spO2 96%, HR 120bpm, HR <100 with 2 minutes seated rest.  Standing with wt bearing on B UE with reaching with L UE to increase wt bearing and stability of R UE with manual facilitation for shoulder and elbow mm activation.  Pt fatigues easily but improves mm strength with this activity.  Kinetron for LE strength and stability 2 x 2 minutes.  Pt requires frequent rest breaks this session but is motivated to improve.   DONAWERTH,KAREN 06/22/2015, 8:25 AM

## 2015-06-22 NOTE — Progress Notes (Signed)
Occupational Therapy Note  Patient Details  Name: Raylin Waitkus MRN: [id number] Date of Birth: [birth year]  Today's Date: 06/22/2015 OT Individual Time: 1345-1415 OT Individual Time Calculation (min): 30 min   Pt denied pain Individual therapy  Pt resting in w/c upon arrival.  Focus on therapeutic tasks with RUE including shoulder flexion, elbow flexion/extension, supination/pronation, wrist flexion/extension, and grasp/release.  Pt required max multimodal cues for isolation of movements but was able to perform tasks appropriately when cued.  Pt noted with improved ability to perform tasks with repetition. Pt remained in w/c with all needs within reach.   Leotis Shames [facility] 06/22/2015, 2:31 PM

## 2015-06-22 NOTE — Progress Notes (Signed)
Occupational Therapy Session Note  Patient Details  Name: [name] [name] MRN: [id number] Date of Birth: 27-Aug-1955  Today's Date: 06/22/2015 OT Individual Time: [id number] OT Individual Time Calculation (min): 70 min    Short Term Goals: Week 1:  OT Short Term Goal 1 (Week 1): Pt will perform all bathing sit to stand with min assist 3 consecutive sessions. OT Short Term Goal 2 (Week 1): Pt will donn pullover shirt with supervison following hemi techniques OT Short Term Goal 3 (Week 1): Pt will complete LB dressing sit to stand with min assist 3 consecutive sessions. OT Short Term Goal 4 (Week 1): Pt will complete toilet transfers with min guard assist using LRAD. OT Short Term Goal 5 (Week 1): Pt will use the RUE for bathing with min assist to wash the LUE.    Skilled Therapeutic Interventions/Progress Updates:    Pt completed ADL session during OT.  She was able to transition to sitting from supine with supervision and increased time, rolling and sitting up on the right side of the bed, however she did not position her RUE before initiating movement and it ended up behind her.  Gave min instructional cueing to bring arm across her body to help with ease of rolling and increase normal movement pattern.  She was able to then ambulate around the room with min assist to gather clothing, towels, washcloth, and washpan, all with min facilitation from therapist.  She used the RUE to hold onto the wash pan and washcloths, while ambulating from the bathroom back to the sink.  Completed 1/4 of UB bathing in standing with min guard assist.  Utilized wash mit for the rest with mod assist from therapist for integration of the RUE in sitting.  Pt needs mod instructional cueing to concentrate on moving the RUE over the LUE to wash and not use compensatory strategy of primarily moving the LUE, while just holding the washcloth in one place.  She was able to use the RUE with mod assist for washing most of her LB as well  except for parts of the right foot and the outside of the left leg. Peri washing was completed with use of the LUE and regular washcloth.  Noted small wound on the middle of her buttocks after washing.  Nursing called and came in to see it.  No measures taken at this time for wound but nursing will follow-up.  She needed mod assist for UB dressing secondary to PICC line being hooked up with mod assist for pulling pants over her hips in standing secondary to limited time.  She complete oral hygiene from sitting position with supervision before being positioned beside the bed with 1/2 lap tray and call button in place.  Pt's spouse also present for supervision.    Therapy Documentation Precautions:  Precautions Precautions: Fall Precaution Comments: R hemiplegia UE more impaired than LE Required Braces or Orthoses: Sling Restrictions Weight Bearing Restrictions: No  Pain: Pain Assessment Pain Assessment: No/denies pain ADL: See Function Navigator for Current Functional Status.   Therapy/Group: Individual Therapy  Legend Pecore OTR/L 06/22/2015, 3:51 PM

## 2015-06-23 ENCOUNTER — Inpatient Hospital Stay (HOSPITAL_COMMUNITY): Payer: BC Managed Care – PPO | Admitting: Speech Pathology

## 2015-06-23 ENCOUNTER — Inpatient Hospital Stay (HOSPITAL_COMMUNITY): Payer: BC Managed Care – PPO | Admitting: Physical Therapy

## 2015-06-23 ENCOUNTER — Inpatient Hospital Stay (HOSPITAL_COMMUNITY): Payer: BC Managed Care – PPO | Admitting: Occupational Therapy

## 2015-06-23 LAB — CREATININE, SERUM
CREATININE: 0.6 mg/dL (ref 0.44–1.00)
GFR calc Af Amer: >60 mL/min (ref >60)

## 2015-06-23 LAB — GLUCOSE, CAPILLARY
GLUCOSE-CAPILLARY: 125 mg/dL — AB (ref 65–99)
Glucose-Capillary: 117 mg/dL — ABNORMAL HIGH (ref 65–99)
Glucose-Capillary: 141 mg/dL — ABNORMAL HIGH (ref 65–99)
Glucose-Capillary: 146 mg/dL — ABNORMAL HIGH (ref 65–99)

## 2015-06-23 MED ORDER — RESOURCE THICKENUP CLEAR PO POWD
ORAL | Status: DC | PRN
  Filled 2015-06-23: qty 125

## 2015-06-23 MED ORDER — ENSURE ENLIVE PO LIQD
237.0000 mL | Freq: Two times a day (BID) | ORAL | Status: DC
  Administered 2015-06-26 – 2015-06-30 (×9): 237 mL via ORAL

## 2015-06-23 NOTE — Progress Notes (Signed)
Speech Language Pathology Daily Session Note  Patient Details  Name: [name] [name] MRN: [id number] Date of Birth: Jul 17, 1955  Today's Date: 06/23/2015 SLP Concurrent Time: 1300-1400 SLP Concurrent Time Calculation (min): 60 min   Short Term Goals: Week 1: SLP Short Term Goal 1 (Week 1): Pt will consume therapeutic trials of nectar thick liquids via teaspoon with minimal overt s/s of aspiration and supervision verbal cues for use of swallowing precautions over three consecutive sessions prior to repeat objective swallow study  SLP Short Term Goal 2 (Week 1): Pt will complete semi-complex functional home management tasks for >80% accuracy wtih supervision verbal cues for working memory.   SLP Short Term Goal 3 (Week 1): Pt will return demonstration of pharyngeal strengthening exercises for 25 repetitions each with supervision verbal cues SLP Short Term Goal 4 (Week 1): Pt will utilize compensatory memory strategies to facilitate recall of complex information wtih supervision verbal cues.    Skilled Therapeutic Interventions: Pt seen for skilled dysphagia and cognitive therapy. Pt able to observe swallow precautions for Dys 2 diet with only occasional verbal cueing for throat clear. 1 episode of wet vocal quality during meal, no other overt s/s aspiration. Pt able to participate in card game with primary barrier of anxiety which appeared to limit prompt initiation of verbal response. 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   Function:  Eating Eating   Modified Consistency Diet: Yes Eating Assist Level: Supervision or verbal cues           Cognition Comprehension Comprehension assist level: Follows basic conversation/direction with no assist  Expression   Expression assist level: Expresses basic needs/ideas: With extra time/assistive device  Social Interaction Social Interaction assist level: Interacts appropriately 75 - 89%  of the time - Needs redirection for appropriate language or to initiate interaction.  Problem Solving Problem solving assist level: Solves basic 90% of the time/requires cueing < 10% of the time  Memory Memory assist level: Recognizes or recalls 90% of the time/requires cueing < 10% of the time    Pain Pain Assessment Pain Assessment: No/denies pain  Therapy/Group: Individual Therapy  [name] 06/23/2015, 4:56 PM

## 2015-06-23 NOTE — Progress Notes (Signed)
Speech Language Pathology Daily Session Note  Patient Details  Name: Kariel Lucey MRN: [id number] Date of Birth: 03-28-1956  Today's Date: 06/23/2015 SLP Individual Time: 1500-1530 SLP Individual Time Calculation (min): 30 min  Short Term Goals: Week 1: SLP Short Term Goal 1 (Week 1): Pt will consume therapeutic trials of nectar thick liquids via teaspoon with minimal overt s/s of aspiration and supervision verbal cues for use of swallowing precautions over three consecutive sessions prior to repeat objective swallow study  SLP Short Term Goal 2 (Week 1): Pt will complete semi-complex functional home management tasks for >80% accuracy wtih supervision verbal cues for working memory.   SLP Short Term Goal 3 (Week 1): Pt will return demonstration of pharyngeal strengthening exercises for 25 repetitions each with supervision verbal cues SLP Short Term Goal 4 (Week 1): Pt will utilize compensatory memory strategies to facilitate recall of complex information wtih supervision verbal cues.    Skilled Therapeutic Interventions:  Pt was seen for skilled ST targeting dysphagia goals.  SLP facilitated the session with skilled observations completed during presentations of pt's recently initiated diet.  Pt consumed dys 2 textures and nectar thick liquids with overall supervision cues for use of extra swallows and hard cough.  Pt's vocal quality remained clear following consumption of solids and liquids and no other overt s/s of aspiration were evident except for x1 reflexive cough which SLP suspects to be related to sensation of pharyngeal residue entering the airway.   RN made aware that pt needed to finish lunch upon therapist's departure.  Pt aware that she needed to wait for nurse or nurse tech to resume eating.   Pt was left with call bell within reach.  Continue per current plan of care.    Function:  Eating Eating   Modified Consistency Diet: Yes Eating Assist Level: Supervision or verbal cues            Cognition Comprehension Comprehension assist level: Follows basic conversation/direction with no assist  Expression   Expression assist level: Expresses basic needs/ideas: With extra time/assistive device  Social Interaction Social Interaction assist level: Interacts appropriately 90% of the time - Needs monitoring or encouragement for participation or interaction.  Problem Solving Problem solving assist level: Solves basic 90% of the time/requires cueing < 10% of the time  Memory Memory assist level: Recognizes or recalls 90% of the time/requires cueing < 10% of the time    Pain Pain Assessment Pain Assessment: No/denies pain  Therapy/Group: Individual Therapy  Kiylee Thoreson, Selinda Orion 06/23/2015, 4:32 PM

## 2015-06-23 NOTE — Progress Notes (Signed)
[facility]  Patient Status: New pt with [facility] this admission  [facility] is providing the following services: HHRN and Home Infusion Pharmacy team for home IVABX.  [facility] hospital infusion coordinator will provide in hospital teaching with patient and husband with set up and administration of IV ABX to support their independence at home.    If patient discharges after hours, please call [phone].   [name] [name] 06/23/2015, 5:22 PM

## 2015-06-23 NOTE — Progress Notes (Addendum)
Nutrition Follow-up  DOCUMENTATION CODES:   Not applicable  INTERVENTION:  Provide Ensure Enlive po BID (thickened to appropriate consistency), each supplement provides 350 kcal and 20 grams of protein.  Continue free water flushes per tube.  Encourage adequate PO intake.   RD to continue to monitor.  NUTRITION DIAGNOSIS:   Inadequate oral intake related to inability to eat as evidenced by NPO status; diet advanced; po intake 50%; improving  GOAL:   Patient will meet greater than or equal to 90% of their needs; progressing  MONITOR:   PO intake, Supplement acceptance, Weight trends, Labs, I & O's  REASON FOR ASSESSMENT:   Consult Enteral/tube feeding initiation and management  ASSESSMENT:   60 y.o.right handed female with history of Sjogrens disease/rheumatoid arthritis maintained on plaquenil and Prednisone, noted history of seizure but not on antiepileptic meds and dysphagia.  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 Echocardiogram with ejection fraction of 65% no wall motion abnormalities. 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  Modified barium swallow 06/09/2015 shows severe pharyngeal dysphagia as well as esophageal stricture. Patient underwent gastrostomy tube per interventional radiology 06/14/2015.  Meal completion has been 50%. Tube feeding has been discontinued. Pt, NT, and RN report pt received a regular tray this morning and last night thus unable to consume meal. RD to check health touch to review meal tickets/tray. RD to order Ensure to aid in caloric and protein needs. RD to continue to monitor.   Diet Order:  DIET DYS 2 Room service appropriate?: Yes; Fluid consistency:: Nectar Thick  Skin:   (Incision on abdomen)  Last BM:  3/8  Height:   Ht Readings from Last 1 Encounters:  06/16/15 5\' 1"  (1.549 m)    Weight:   Wt Readings from Last 1 Encounters:  06/23/15 117 lb 11.2 oz (53.388  kg)    Ideal Body Weight:  47.7 kg  BMI:  Body mass index is 22.25 kg/(m^2).  Estimated Nutritional Needs:   Kcal:  1650-1850  Protein:  70-80 grams  Fluid:  1.6  - 1.8 L/day  EDUCATION NEEDS:   No education needs identified at this time  Corrin Parker, MS, RD, LDN Pager # [phone] After hours/ weekend pager # [phone]

## 2015-06-23 NOTE — Progress Notes (Signed)
Occupational Therapy Weekly Progress Note  Patient Details  Name: [name] [name] MRN: [id number] Date of Birth: 21-Nov-1955  Beginning of progress report period: June 17, 2015 End of progress report period: June 23, 2015  Today's Date: 06/23/2015 OT Individual Time: 7253-6644 OT Individual Time Calculation (min): 63 min    Patient has met 2 of 5 short term goals.  Mrs. Watton is making great progress with OT at this time.  Currently, she is able to perform transfers to the elevated toilet and to the wheelchair in front of the sink for bathing with min guard assist.  RUE function continues to improve, and at this time she is able to use the RUE at a dminished level with min to mod assist during bathing tasks.  Wash mit has been incorporated to help maintain hand contact with the washcloth so focus can be emphasized on shoulder and elbow function during bathing.  She is able to use the RUE for weightbearing with supervision to min assist when standing during LB selfcare or with toileting tasks using the grab bar.  UB and LB selfcare tasks are at a min assist level if the LUE is primarily used and hemi dressing techniques are employed.  During therapy she is forced to use the RUE for majority of bathing in order to increase functional use so mod assist is then required overall.  Anticipate she will continue to make steady progress with selfcare tasks in anticipation of discharge at a supervision level 3/15.  Will continue with current OT POC.    Patient continues to demonstrate the following deficits: decreased balance, decreased RUE functional use, decreased strength, and therefore will continue to benefit from skilled OT intervention to enhance overall performance with BADL and iADL.  Patient progressing toward long term goals..  Continue plan of care.  OT Short Term Goals Week 2:  OT Short Term Goal 1 (Week 2): Pt will continue working toward established LTGs set at supervision level  overall.  Skilled Therapeutic Interventions/Progress Updates:    Pt completed toileting, grooming, and bathing tasks during session.  Min guard assist for ambulating to the bathroom without use of an assistive device.  She was able to complete all aspects of toileting with min assist sit to stand, incorporating the RUE as a stabilizer with supervision while tearing her toilet paper.  Transferred out to the sink for grooming and bathing tasks.  Used wash mit over the RUE for bathing 85% of her body with mod assist.  Mrs. Mayberry continues to need mod instructional cueing to recall whether or not her brakes are locked or unlocked and usually reaches both the the LUE.  Mod assist needed if using the RUE to unlock on lock the one on the right side.  Min guard assist for sit to stand and standing while completing peri washing.  She places the RUE on the sink for weightbearing while washing the RUE.  Min instructional cueing needed at times to remember to remove the RUE from the sink before sitting to keep it from falling down. Therapist assisted with most dressing tasks secondary to time this session but she was able to complete oral hygiene as well as brushing her hair with setup.  Pt left in wheelchair at end of session with 1/2 lap tray in place and call button and phone on the right side where she could safely reach them.  Instructed to pt complete AROM exercises using her towel and 1/2 lap tray.  She was able to  demonstrate this to the therapist.    Therapy Documentation Precautions:  Precautions Precautions: Fall Precaution Comments: R hemiplegia UE more impaired than LE Required Braces or Orthoses: Sling Restrictions Weight Bearing Restrictions: No  Pain: Pain Assessment Pain Assessment: No/denies pain ADL: See Function Navigator for Current Functional Status.   Therapy/Group: Individual Therapy  Anu Stagner OTR/L 06/23/2015, 5:10 PM

## 2015-06-23 NOTE — Progress Notes (Signed)
Subjective/Complaints: Past modified now on d2 nectar thick 50% intake for lunch yesterday, breakfast is pending, still getting full to feeding via PEG Review of systems negative for chest pain, shortness of breath, nausea vomiting diarrhea constipation Objective: Vital Signs: Blood pressure 104/62, pulse 93, temperature 98.1 F (36.7 C), temperature source Oral, resp. rate 16, height 5' 1"  (1.549 m), weight 53.388 kg (117 lb 11.2 oz), SpO2 95 %. Dg Swallowing Func-speech Pathology  06/22/2015  Objective Swallowing Evaluation: Type of Study: MBS-Modified Barium Swallow Study Patient Details Name: Noemie Devivo MRN: [id number] Date of Birth: [birth year] Today's Date: 06/22/2015 Time: SLP Start Time: 0915-SLP Stop Time: 0936 SLP Time Calculation (min) (ACUTE ONLY): 21 min Past Medical History: Past Medical History Diagnosis Date . Hypothyroidism  . Seizures ([place])  . Adenopathy  . Scoliosis  . Spleen anomaly  . Anemia  . Sjogren's disease ([place])  . Raynaud disease  . Shingles  . Osteopenia  Past Surgical History: Past Surgical History Procedure Laterality Date . Tonsillectomy   . Breast cyst aspiration Right 1990's   neg HPI: Ms/ Guevarra is a 60 y/o woman with a history of Sjogren's disease, hypothyroidism and dysphagia. She was diagnosed with the flu (A) about 4 days PTA and started on tamiflu. 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  Intubated from 2/12 to 2/21.There is a question of esophageal stricture; pt was seen by GI at [facility], complained of globus with pills and some solids, occuring 2-3x a week. Was scheduled for an EGD at [facility], but appears to have cancelled the procedure. 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  NPO per MBS on 2/23 and 2/28.  Repeat study ordered today per improved presentation at bedside.  Assessment / Plan / Recommendation CHL IP CLINICAL  IMPRESSIONS 06/22/2015 Therapy Diagnosis Moderate pharyngeal phase dysphagia;Moderate cervical esophageal phase dysphagia Clinical Impression Pt presents with continued improvements in overall swallowing function in comparison to previous study, largely due to her improved overall endurance (ambulating with PT), respiratory function (now on room air), and ability to effectively utilize compensatory strategies to clear penetration and pharyngeal residue.  Pt continues to present with moderate sensorimotor impairments resulting in delay in swallow response to the level of the vallecula as well as weakened anterior hyolaryngeal movement, decreased base of tongue retraction, and decreased epiglottic inversion which leads to decreased laryngeal closure and moderate pharyngeal residuals at posterior pharyngeal wall, vallecula, and pyriforms.  Delay in swallow response in combination with pharyngeal residuals impacting bolus flow resulted in penetration during the swallow with nectar thick liquids which cleared with cues for multiple swallows and hard cough.  Penetration with thin liquids contacted the cords and pt was able to clear most but not all penetrates.   Pharyngeal residuals were reduced from moderate to mild with pt's self initiated use of multiple effortful swallows.  Recommend initiating diet of Dys 2 textures, nectar thick liquids; meds via PEG with full supervision for use of swallowing precautions: multiple effortful swallows after each bite/sip; hard cough after each sip followed by effortful swallow.   Impact on safety and function Mild aspiration risk     Prognosis 06/22/2015 Prognosis for Safe Diet Advancement Good Barriers to Reach Goals -- Barriers/Prognosis Comment -- CHL IP DIET RECOMMENDATION 06/22/2015 SLP Diet Recommendations Dysphagia 2 (Fine chop) solids;Nectar thick liquid Liquid Administration via Cup Medication Administration Via alternative means Compensations Slow rate;Small  sips/bites;Multiple  dry swallows after each bite/sip;Hard cough after swallow;Effortful swallow Postural Changes --            CHL IP ORAL PHASE 06/22/2015 Oral Phase WFL Oral - Pudding Teaspoon -- Oral - Pudding Cup -- Oral - Honey Teaspoon -- Oral - Honey Cup -- Oral - Nectar Teaspoon -- Oral - Nectar Cup -- Oral - Nectar Straw -- Oral - Thin Teaspoon -- Oral - Thin Cup -- Oral - Thin Straw -- Oral - Puree -- Oral - Mech Soft -- Oral - Regular -- Oral - Multi-Consistency -- Oral - Pill -- Oral Phase - Comment --  CHL IP PHARYNGEAL PHASE 06/22/2015 Pharyngeal Phase -- Pharyngeal- Pudding Teaspoon -- Pharyngeal -- Pharyngeal- Pudding Cup -- Pharyngeal -- Pharyngeal- Honey Teaspoon -- Pharyngeal -- Pharyngeal- Honey Cup -- Pharyngeal -- Pharyngeal- Nectar Teaspoon Delayed swallow initiation-vallecula;Reduced epiglottic inversion;Reduced anterior laryngeal mobility;Reduced airway/laryngeal closure;Reduced tongue base retraction;Pharyngeal residue - valleculae;Pharyngeal residue - pyriform;Pharyngeal residue - posterior pharnyx Pharyngeal -- Pharyngeal- Nectar Cup Delayed swallow initiation-vallecula;Reduced epiglottic inversion;Reduced anterior laryngeal mobility;Reduced airway/laryngeal closure;Reduced tongue base retraction;Penetration/Aspiration during swallow;Pharyngeal residue - valleculae;Pharyngeal residue - pyriform;Pharyngeal residue - posterior pharnyx Pharyngeal Material enters airway, remains ABOVE vocal cords then ejected out Pharyngeal- Nectar Straw -- Pharyngeal -- Pharyngeal- Thin Teaspoon Delayed swallow initiation-vallecula;Reduced epiglottic inversion;Reduced anterior laryngeal mobility;Reduced airway/laryngeal closure;Reduced tongue base retraction;Penetration/Aspiration during swallow;Pharyngeal residue - valleculae;Pharyngeal residue - pyriform;Pharyngeal residue - posterior pharnyx Pharyngeal Material enters airway, CONTACTS cords and then ejected out Pharyngeal- Thin Cup Delayed swallow initiation-vallecula;Reduced  epiglottic inversion;Reduced anterior laryngeal mobility;Reduced airway/laryngeal closure;Reduced tongue base retraction;Penetration/Aspiration during swallow;Pharyngeal residue - pyriform;Pharyngeal residue - valleculae;Pharyngeal residue - posterior pharnyx Pharyngeal Material enters airway, CONTACTS cords and not ejected out Pharyngeal- Thin Straw -- Pharyngeal -- Pharyngeal- Puree Delayed swallow initiation-vallecula;Reduced epiglottic inversion;Reduced anterior laryngeal mobility;Reduced airway/laryngeal closure;Reduced tongue base retraction;Pharyngeal residue - valleculae;Pharyngeal residue - pyriform;Pharyngeal residue - posterior pharnyx Pharyngeal -- Pharyngeal- Mechanical Soft -- Pharyngeal -- Pharyngeal- Regular Delayed swallow initiation-vallecula;Reduced epiglottic inversion;Reduced anterior laryngeal mobility;Reduced airway/laryngeal closure;Reduced tongue base retraction;Pharyngeal residue - valleculae;Pharyngeal residue - pyriform;Pharyngeal residue - posterior pharnyx Pharyngeal -- Pharyngeal- Multi-consistency -- Pharyngeal -- Pharyngeal- Pill -- Pharyngeal -- Pharyngeal Comment --  CHL IP CERVICAL ESOPHAGEAL PHASE 06/15/2015 Cervical Esophageal Phase Impaired Pudding Teaspoon -- Pudding Cup -- Honey Teaspoon -- Honey Cup -- Nectar Teaspoon Reduced cricopharyngeal relaxation Nectar Cup -- Nectar Straw -- Thin Teaspoon Reduced cricopharyngeal relaxation Thin Cup -- Thin Straw -- Puree -- Mechanical Soft -- Regular -- Multi-consistency -- Pill -- Cervical Esophageal Comment -- No flowsheet data found. Page, Selinda Orion 06/22/2015, 11:03 AM              Results for orders placed or performed during the hospital encounter of 06/16/15 (from the past 72 hour(s))  Glucose, capillary     Status: Abnormal   Collection Time: 06/20/15 11:44 AM  Result Value Ref Range   Glucose-Capillary 169 (H) 65 - 99 mg/dL   Comment 1 Notify RN   Glucose, capillary     Status: Abnormal   Collection Time: 06/20/15  4:48  PM  Result Value Ref Range   Glucose-Capillary 135 (H) 65 - 99 mg/dL   Comment 1 Notify RN   Glucose, capillary     Status: Abnormal   Collection Time: 06/20/15  8:58 PM  Result Value Ref Range   Glucose-Capillary 136 (H) 65 - 99 mg/dL  Glucose, capillary     Status: None   Collection Time: 06/21/15 12:13 AM  Result Value Ref Range  Glucose-Capillary 94 65 - 99 mg/dL   Comment 1 Notify RN   Glucose, capillary     Status: Abnormal   Collection Time: 06/21/15  4:40 AM  Result Value Ref Range   Glucose-Capillary 125 (H) 65 - 99 mg/dL  Glucose, capillary     Status: Abnormal   Collection Time: 06/21/15  8:15 AM  Result Value Ref Range   Glucose-Capillary 140 (H) 65 - 99 mg/dL  Glucose, capillary     Status: Abnormal   Collection Time: 06/21/15 11:48 AM  Result Value Ref Range   Glucose-Capillary 182 (H) 65 - 99 mg/dL  Glucose, capillary     Status: Abnormal   Collection Time: 06/21/15  2:21 PM  Result Value Ref Range   Glucose-Capillary 132 (H) 65 - 99 mg/dL  Glucose, capillary     Status: Abnormal   Collection Time: 06/21/15  4:13 PM  Result Value Ref Range   Glucose-Capillary 153 (H) 65 - 99 mg/dL  Glucose, capillary     Status: Abnormal   Collection Time: 06/21/15  7:52 PM  Result Value Ref Range   Glucose-Capillary 114 (H) 65 - 99 mg/dL  Glucose, capillary     Status: Abnormal   Collection Time: 06/21/15 11:55 PM  Result Value Ref Range   Glucose-Capillary 113 (H) 65 - 99 mg/dL  Glucose, capillary     Status: Abnormal   Collection Time: 06/22/15  4:24 AM  Result Value Ref Range   Glucose-Capillary 112 (H) 65 - 99 mg/dL  Glucose, capillary     Status: Abnormal   Collection Time: 06/22/15  8:27 AM  Result Value Ref Range   Glucose-Capillary 174 (H) 65 - 99 mg/dL  Glucose, capillary     Status: Abnormal   Collection Time: 06/22/15 12:18 PM  Result Value Ref Range   Glucose-Capillary 132 (H) 65 - 99 mg/dL  Glucose, capillary     Status: Abnormal   Collection Time:  06/22/15  4:43 PM  Result Value Ref Range   Glucose-Capillary 121 (H) 65 - 99 mg/dL  Glucose, capillary     Status: Abnormal   Collection Time: 06/22/15  7:59 PM  Result Value Ref Range   Glucose-Capillary 145 (H) 65 - 99 mg/dL   Comment 1 Notify RN   Glucose, capillary     Status: Abnormal   Collection Time: 06/23/15 12:04 AM  Result Value Ref Range   Glucose-Capillary 117 (H) 65 - 99 mg/dL  Glucose, capillary     Status: Abnormal   Collection Time: 06/23/15  3:56 AM  Result Value Ref Range   Glucose-Capillary 125 (H) 65 - 99 mg/dL  Creatinine, serum     Status: None   Collection Time: 06/23/15  5:00 AM  Result Value Ref Range   Creatinine, Ser 0.60 0.44 - 1.00 mg/dL   GFR calc non Af Amer >60 >60 mL/min   GFR calc Af Amer >60 >60 mL/min    Comment: (NOTE) The eGFR has been calculated using the CKD EPI equation. This calculation has not been validated in all clinical situations. eGFR's persistently <60 mL/min signify possible Chronic Kidney Disease.   Glucose, capillary     Status: Abnormal   Collection Time: 06/23/15  8:20 AM  Result Value Ref Range   Glucose-Capillary 146 (H) 65 - 99 mg/dL      General: No acute distress Mood and affect are appropriate Heart: Regular rate and rhythm no rubs murmurs or extra sounds Lungs: Clear to auscultation, breathing unlabored, no rales  or wheezes, decreased breath sounds at bases Abdomen: Positive bowel sounds, soft nontender to palpation, nondistended Extremities: No clubbing, cyanosis, or edema Skin: No evidence of breakdown, no evidence of rash Neurologic: Cranial nerves II through XII intact, motor strength is 5/5 in left deltoid, bicep, tricep, grip, hip flexor, knee extensors, ankle dorsiflexor and plantar flexor, 3 minus right deltoid, bicep, tricep, grip,, 4 minus right hip flexor and knee extensor and 2 minus ankle dorsiflexor Sensory exam normal sensation to light touch in bilateral upper and lower extremities Oriented to  person place and time   Musculoskeletal: Full passive range  of motion in all 4 extremities. No joint swelling   Assessment/Plan: 1. Functional deficits secondary to Right side weakness/dysphagia secondary to left frontal lobe infarct complicated by respiratory failure from septic shock. which require 3+ hours per day of interdisciplinary therapy in a comprehensive inpatient rehab setting. Physiatrist is providing close team supervision and 24 hour management of active medical problems listed below. Physiatrist and rehab team continue to assess barriers to discharge/monitor patient progress toward functional and medical goals. FIM: Function - Bathing Position: Wheelchair/chair at sink Body parts bathed by patient: Right arm, Front perineal area, Buttocks Body parts bathed by helper: Left lower leg, Right lower leg, Back, Buttocks, Front perineal area, Left arm, Chest, Abdomen (Therapist provided mod hand over hand assistance) Assist Level: Touching or steadying assistance(Pt > 75%)  Function- Upper Body Dressing/Undressing What is the patient wearing?: Pull over shirt/dress Pull over shirt/dress - Perfomed by patient: Put head through opening, Pull shirt over trunk Pull over shirt/dress - Perfomed by helper: Thread/unthread right sleeve, Thread/unthread left sleeve Assist Level: Supervision or verbal cues Function - Lower Body Dressing/Undressing What is the patient wearing?: Underwear, [employer], Pants, Socks Position: Education officer, museum at [employer] - Performed by patient: Thread/unthread right underwear leg, Thread/unthread left underwear leg Underwear - Performed by helper: Pull underwear up/down (therapist assist secondary to decreased time) Pants- Performed by patient: Thread/unthread right pants leg, Thread/unthread left pants leg Pants- Performed by helper: Pull pants up/down (therapist assist secondary to decreased time) Socks - Performed by patient: Don/doff left sock Socks -  Performed by helper: Don/doff right sock Shoes - Performed by patient: Don/doff right shoe, Don/doff left shoe Shoes - Performed by helper: Fasten right, Fasten left TED Hose - Performed by helper: Don/doff right TED hose, Don/doff left TED hose Assist for footwear: Supervision/touching assist Assist for lower body dressing: Touching or steadying assistance (Pt > 75%)  Function - Toileting Toileting steps completed by patient: Adjust clothing after toileting Toileting steps completed by helper: Adjust clothing prior to toileting, Performs perineal hygiene Toileting Assistive Devices: Grab bar or rail Assist level: Touching or steadying assistance (Pt.75%)  Function - Air cabin crew transfer assistive device: Grab bar Assist level to toilet: Touching or steadying assistance (Pt > 75%) Assist level from toilet: Touching or steadying assistance (Pt > 75%) Assist level to bedside commode (at bedside): Touching or steadying assistance (Pt > 75%) Assist level from bedside commode (at bedside): Touching or steadying assistance (Pt > 75%)  Function - Chair/bed transfer Chair/bed transfer method: Squat pivot, Ambulatory Chair/bed transfer assist level: Touching or steadying assistance (Pt > 75%) Chair/bed transfer assistive device: Walker Chair/bed transfer details: Verbal cues for sequencing, Verbal cues for technique, Verbal cues for precautions/safety, Manual facilitation for weight shifting, Manual facilitation for placement  Function - Locomotion: Wheelchair Will patient use wheelchair at discharge?: No Type: Manual Max wheelchair distance: 150 Assist Level: Supervision or verbal  cues Assist Level: Supervision or verbal cues Wheel 150 feet activity did not occur: Safety/medical concerns Assist Level: Supervision or verbal cues Turns around,maneuvers to table,bed, and toilet,negotiates 3% grade,maneuvers on rugs and over doorsills: No Function - Locomotion:  Ambulation Assistive device: Walker-rolling Max distance: 150 Assist level: Touching or steadying assistance (Pt > 75%) Assist level: Touching or steadying assistance (Pt > 75%) Walk 50 feet with 2 turns activity did not occur: Safety/medical concerns Assist level: Touching or steadying assistance (Pt > 75%) Walk 150 feet activity did not occur: Safety/medical concerns Assist level: Touching or steadying assistance (Pt > 75%) Walk 10 feet on uneven surfaces activity did not occur: Safety/medical concerns Assist level: Touching or steadying assistance (Pt > 75%)  Function - Comprehension Comprehension: Auditory Comprehension assist level: Follows basic conversation/direction with no assist  Function - Expression Expression: Verbal Expression assist level: Expresses basic needs/ideas: With extra time/assistive device  Function - Social Interaction Social Interaction assist level: Interacts appropriately 90% of the time - Needs monitoring or encouragement for participation or interaction.  Function - Problem Solving Problem solving assist level: Solves basic 90% of the time/requires cueing < 10% of the time  Function - Memory Memory assist level: Recognizes or recalls 90% of the time/requires cueing < 10% of the time Patient normally able to recall (first 3 days only): Current season, Location of own room, Staff names and faces, That he or she is in a hospital  Medical Problem List and Plan: 1.  Right side weakness/dysphagia secondary to left frontal lobe infarct complicated by respiratory failure, Team conference today please see physician documentation under team conference tab, met with team face-to-face to discuss problems,progress, and goals. Formulized individual treatment plan based on medical history, underlying problem and comorbidities.  2.  DVT Prophylaxis/Anticoagulation: SCDs. Monitor for any signs of DVT 3. Pain Management: Tylenol as needed 4. Dysphagia/esophageal  stricture. Status post gastrostomy tube 06/14/2015 per interventional radiology. Speech therapy follow-up. Plan outpatient EGD endoscopy for dilatation of stricture, swallowing dysfunction improving when comparing the modified barium swallow performed 06/09/2015 versus 06/15/2015, repeat study 3/7 showed improvement, we will hold tube feeds and measure by mouth intake5. Neuropsych: This patient is capable of making decisions on her own behalf. 6. Skin/Wound Care: Routine skin checks, gastrostomy tube site looks okay except has some dried blood. Will write for daily normal saline rinses 7. Fluids/Electrolytes/Nutrition: Routine I&O's with follow-up chemistries 8. ID. Strep pyogenes bacteremia. Followed by infectious disease. Continue ceftriaxone questions 6 week course and confirm with Dr. Drucilla Schmidt. Maintain contact precautions 9. Atrial flutter with RVR. Lopressor 6.25 mg twice a day,blood pressure and heart rate are stable 10. Rheumatoid arthritis/Sjorgens disease. Discuss resuming plaquenil and chronic prednisone.no active joint inflammation at the current time 11. Hyperglycemia secondary to chronic prednisone/tube feeds. Hemoglobin A1c 6.6. Expect this to go down now that patient is switching to oral feeds 12. Hyperlipidemia. Lipitor, patient has elevated liver function tests, we'll reduce Lipitor from 40 mg to 20 mg and recheck C met  in 1-2 weeks 13. Hypothyroidism. Resume Synthroid 14. Elevated LFTs, had recent gallbladder ultrasound which was negative. Cholelithiasis but no clinical evidence of cholecystitis LOS (Days) 7 A FACE TO FACE EVALUATION WAS PERFORMED  Ferguson Gertner E 06/23/2015, 9:22 AM

## 2015-06-23 NOTE — Progress Notes (Signed)
Called to pt' room by NT because pt's BSC was full of moderate amt of bright red blood and small amt of blood clots. Pt unsure if she voided blood or if she passed gas and blood came out. I assessed and cleaned up the pt and did not see and blood or active bleeding. Called Dan A. PA to inform of pt's condition. I received orders and will continue to monitor. Lynett Fish, RN

## 2015-06-23 NOTE — Progress Notes (Signed)
Physical Therapy Session Note  Patient Details  Name: [name] [name] MRN: [id number] Date of Birth: October 15, 1955  Today's Date: 06/23/2015 PT Group Time: 0900-1000 PT Group Time Calculation (min): 60 min  Short Term Goals: Week 1:  PT Short Term Goal 1 (Week 1): Pt will perform all bed mobiltiy with S, including UE management PT Short Term Goal 2 (Week 1): Pt will perform WC<>bed transfers with Min A consistently PT Short Term Goal 3 (Week 1): Pt will ambulate x50' with RW and Min A PT Short Term Goal 4 (Week 1): Pt will perform 12 stairs with Min A  PT Short Term Goal 5 (Week 1): Pt will demonstrate static standing balance x5 min for functional tasks with close S  Skilled Therapeutic Interventions/Progress Updates:    Pt participated in group therapy session focusing on dynamic gait, standing balance, endurance. Pt engaged in bowling with LUE throwing, utilized BUEs to retrieve pins from the ground to setup, modA for RUE due to strength and coordination deficits. While other group member taking a turn, pt ambulated 300' x3 trials without AD, min guard for standing balance. Pt has minor LOB in standing between bowling turns and panics however does recover without assist. Pt remained seated in w/c at completion of session; NA alerted to pt request for assistance to complete eating breakfast.   Therapy Documentation Precautions:  Precautions Precautions: Fall Precaution Comments: R hemiplegia UE more impaired than LE Required Braces or Orthoses: Sling Restrictions Weight Bearing Restrictions: No Pain: Pain Assessment Pain Assessment: No/denies pain Pain Score: 0-No pain   See Function Navigator for Current Functional Status.   Therapy/Group: Group Therapy  [name] [name] 06/23/2015, 11:24 AM

## 2015-06-24 ENCOUNTER — Ambulatory Visit (HOSPITAL_COMMUNITY): Payer: BC Managed Care – PPO | Admitting: Occupational Therapy

## 2015-06-24 ENCOUNTER — Inpatient Hospital Stay (HOSPITAL_COMMUNITY): Payer: BC Managed Care – PPO | Admitting: Speech Pathology

## 2015-06-24 ENCOUNTER — Inpatient Hospital Stay (HOSPITAL_COMMUNITY): Payer: BC Managed Care – PPO | Admitting: Physical Therapy

## 2015-06-24 ENCOUNTER — Inpatient Hospital Stay (HOSPITAL_COMMUNITY): Payer: BC Managed Care – PPO | Admitting: Occupational Therapy

## 2015-06-24 LAB — CBC
HEMATOCRIT: 29.6 % — AB (ref 36.0–46.0)
Hemoglobin: 9.3 g/dL — ABNORMAL LOW (ref 12.0–15.0)
MCH: 30.2 pg (ref 26.0–34.0)
MCHC: 31.4 g/dL (ref 30.0–36.0)
MCV: 96.1 fL (ref 78.0–100.0)
PLATELETS: 633 10*3/uL — AB (ref 150–400)
RBC: 3.08 MIL/uL — ABNORMAL LOW (ref 3.87–5.11)
RDW: 19.8 % — AB (ref 11.5–15.5)
WBC: 9.9 10*3/uL (ref 4.0–10.5)

## 2015-06-24 LAB — URINALYSIS, ROUTINE W REFLEX MICROSCOPIC
Bilirubin Urine: NEGATIVE
GLUCOSE, UA: NEGATIVE mg/dL
KETONES UR: NEGATIVE mg/dL
Nitrite: NEGATIVE
PH: 8 (ref 5.0–8.0)
PROTEIN: NEGATIVE mg/dL
Specific Gravity, Urine: 1.014 (ref 1.005–1.030)

## 2015-06-24 LAB — URINE MICROSCOPIC-ADD ON

## 2015-06-24 LAB — GLUCOSE, CAPILLARY: GLUCOSE-CAPILLARY: 99 mg/dL (ref 65–99)

## 2015-06-24 NOTE — Research (Signed)
[id number]:  This RN met with this patient and confirmed from patient the following about her medical history: -The patient had one grand mal seizure when she was in the 9th grade, and was previously on seizure medications for that.  None in her recent past. -Once occurrence of shingles about 2 years ago. -Patient's tonsillectomy occurred before age 60.  Doreatha Martin, RN

## 2015-06-24 NOTE — Progress Notes (Signed)
Occupational Therapy Note Makeup Session  Patient Details  Name: [name] [name] MRN: [id number] Date of Birth: May 12, 1955  Today's Date: 06/24/2015 OT Individual Time: 1000-1022 OT Individual Time Calculation (min): 22 min   Pt seen this session for skilled OT to facilitate RUE active movement with self feeding. Pt provided with red foam built up handles. Pt chose to eat yogurt with spoon.  Cues to fully squeeze finger tips on foam handle with each scoop. Hand over hand assist to stabilize hand on spoon handle with slight support under elbow to lift arm. Pt did an excellent good with her swallowing precautions.  Pt was able to eat several bites before her PT arrived for her next session.    [place] 06/24/2015, 10:28 AM

## 2015-06-24 NOTE — Progress Notes (Signed)
Social Work Patient ID: [name] [name], female   DOB: 04-23-1955, 60 y.o.   MRN: [id number]   CSW met with pt and her husband to update them on team conference discussion.  Pt's husband asked about the need for a railing to enter the home through the garage.  CSW spoke with PT and he feels that a railing would be helpful.  CSW to pass this along.  Pt/husband also wondering about something for pt's shower and CSW suggested that husband take a picture of the shower set up and of the seat pt has from when she broke her foot, so that OT could help them figure out what would be best to use.  CSW also started process with IV nurse for [place], Carolynn Sayers, as pt will need IV rocephin for 4 more weeks.  She will review pt's chart and meet with pt/husband.  Husband is very willing to learn how to administer the medication.  Pt/husband prefer outpt therapies and this is what team recommends.  CSW will reach out to [facility] and set it up there as that is closer to pt's home.  CSW will continue to follow and assist as needed.

## 2015-06-24 NOTE — Progress Notes (Signed)
Physical Therapy Session Note  Patient Details  Name: [name] [name] MRN: [id number] Date of Birth: 12-17-1955  Today's Date: 06/24/2015 PT Individual Time: 1020-1131 PT Individual Time Calculation (min): 71 min   Short Term Goals: Week 1:  PT Short Term Goal 1 (Week 1): Pt will perform all bed mobiltiy with S, including UE management PT Short Term Goal 2 (Week 1): Pt will perform WC<>bed transfers with Min A consistently PT Short Term Goal 3 (Week 1): Pt will ambulate x50' with RW and Min A PT Short Term Goal 4 (Week 1): Pt will perform 12 stairs with Min A  PT Short Term Goal 5 (Week 1): Pt will demonstrate static standing balance x5 min for functional tasks with close S   Skilled Therapeutic Interventions/Progress Updates:    Gait training in hall with RW. Supervision A. 155ft. With cues for decreased speed to improve obstacle negotiation. PT instructed Gait training in hall without AD, Supervision A 392ft. SpO2 91, HR:112. After 30 second rest SpO2: 97, HR:107. PT provided cues for improved terminal knee extension and trunk rotation with little carry over. Standing balance move cards on velcro board with L UE x 18 on level surface, x 18 on wedge, x 18 on airex. PT provided close supervision with moderate cues for improved weight shift L and R as well as Anterior posterior.  Toe taps on 6 inch step x 15 each LE with BUE support. HR 130, SpO298%, 2 minutes rest break HR 98. Step ups to 6 inch step x 10 each LE BUE support. SpO2: 96% Patient transported to solarium in [facility] for time management. PT instructed patient in gait training on carpet and uneven patio surface, 131ft x2 with close supervision A and cues for improved safety with obstacle negotiation.  Pt returned to to room in [facility] and left with call bell within reach.    Therapy Documentation Precautions:  Precautions Precautions: Fall Precaution Comments: R hemiplegia UE more impaired than LE Required Braces or Orthoses:  Sling Restrictions Weight Bearing Restrictions: No  See Function Navigator for Current Functional Status.   Therapy/Group: Individual Therapy  Lorie Phenix 06/24/2015, 2:27 PM

## 2015-06-24 NOTE — Progress Notes (Signed)
Speech Language Pathology Weekly Progress and Session Note  Patient Details  Name: [name] [name] MRN: [id number] Date of Birth: [birth year]  Beginning of progress report period: 06/17/15 End of progress report period: 3/9/17Today's Date: 06/24/2015 SLP Individual Time: 1400-1455 SLP Individual Time Calculation (min): 55 min  Short Term Goals: Week 1: SLP Short Term Goal 1 (Week 1): Pt will consume therapeutic trials of nectar thick liquids via teaspoon with minimal overt s/s of aspiration and supervision verbal cues for use of swallowing precautions over three consecutive sessions prior to repeat objective swallow study  SLP Short Term Goal 1 - Progress (Week 1): Met SLP Short Term Goal 2 (Week 1): Pt will complete semi-complex functional home management tasks for >80% accuracy wtih supervision verbal cues for working memory.   SLP Short Term Goal 2 - Progress (Week 1): Partly met SLP Short Term Goal 3 (Week 1): Pt will return demonstration of pharyngeal strengthening exercises for 25 repetitions each with supervision verbal cues SLP Short Term Goal 3 - Progress (Week 1): Met SLP Short Term Goal 4 (Week 1): Pt will utilize compensatory memory strategies to facilitate recall of complex information wtih supervision verbal cues.   SLP Short Term Goal 4 - Progress (Week 1): Partly met    New Short Term Goals: Week 2: SLP Short Term Goal 1 (Week 2): Pt will utilize compensatory memory strategies to facilitate recall of complex information wtih supervision verbal cues.   SLP Short Term Goal 2 (Week 2): Pt will complete semi-complex functional home management tasks for >80% accuracy at mod I for increased time/use of compensatory strategies. SLP Short Term Goal 3 (Week 2): Pt will tolerate trials of dysphagia 3 textures without s/s aspiration or anxiety. SLP Short Term Goal 4 (Week 2): Pt will complete pharyngeal strengthening exercises at mod I.  Weekly Progress Updates:  Pt has made excellent gains  this week. MBSS was repeated and pt was upgraded to Dys 2 diet with nectar-thick liquids. Pt has been able to successfully adhere to swallow precautions and was changed to intermittent supervision with PO intake. Pt's primary limiting factor from a cognitive standpoint appears to be anxiety related.   Intensity: Minumum of 1-2 x/day, 30 to 90 minutes Frequency: 3 to 5 out of 7 days Duration/Length of Stay: 12-14 days  Treatment/Interventions: Cognitive remediation/compensation;Cueing hierarchy;Dysphagia/aspiration precaution training;Environmental controls;Functional tasks;Internal/external aids   Daily Session  Skilled Therapeutic Interventions: Subtests of the ALFA were given with the following results: Counting money 8/10, Daily functional math problems 9/10, and medication management 10/10. Used ice chips to facilitate hard swallow and introduced the [place] as a new pharyngeal strengthening exercise. Pt able to complete with increased time and effort.     Function:   Eating Eating   Modified Consistency Diet: Yes Eating Assist Level: Supervision or verbal cues           Cognition Comprehension Comprehension assist level: Understands complex 90% of the time/cues 10% of the time  Expression   Expression assist level: Expresses complex 90% of the time/cues < 10% of the time  Social Interaction Social Interaction assist level: Interacts appropriately 75 - 89% of the time - Needs redirection for appropriate language or to initiate interaction.  Problem Solving Problem solving assist level: Solves basic problems with no assist  Memory Memory assist level: Recognizes or recalls 90% of the time/requires cueing < 10% of the time   General    Pain Pain Assessment Pain Assessment: No/denies pain  Therapy/Group: Individual Therapy  Marcene Brawn  E [name] [name] 06/24/2015, 5:01 PM

## 2015-06-24 NOTE — Patient Care Conference (Signed)
Inpatient RehabilitationTeam Conference and Plan of Care Update Date: 06/23/2015   Time: 10:40 AM    Patient Name: [name] [name]      Medical Record Number: [id number]  Date of Birth: [birth year] Sex: Female         Room/Bed: 4W08C/4W08C-01 Payor Info: Payor: [place] / Plan: [employer] PPO / Product Type: *No Product type* /    Admitting Diagnosis: L CVA  Admit Date/Time:  06/16/2015  6:37 PM Admission Comments: No comment available   Primary Diagnosis:  <principal problem not specified> Principal Problem: <principal problem not specified>  Patient Active Problem List   Diagnosis Date Noted  . Ischemic stroke of frontal lobe ([place]) 06/16/2015  . Dysphagia as late effect of cerebrovascular disease   . Hemiparesis affecting dominant side as late effect of stroke ([place])   . Esophageal stricture   . Bacteremia   . Atrial flutter ([place])   . Rheumatoid arthritis ([place])   . Hyperglycemia   . Thyroid activity decreased   . Infection due to Streptococcus pyogenes 06/12/2015  . Severe sepsis with septic shock ([place]) 06/12/2015  . Hematuria 06/12/2015  . Dysphagia 06/12/2015  . 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 CVA (cerebral infarction)   . Acute respiratory failure ([place])   . Encounter for diagnostic procedure   . Ventilator dependence ([place])   . Carotid thromboses   . HLD (hyperlipidemia)   . Respiratory failure ([place])   . Stroke (cerebrum) ([place])   . Research study patient   . Endotracheal tube present   . Endotracheally intubated   . Encounter for feeding tube placement   . Altered mental state   . Weakness   . Sepsis ([place]) 05/30/2015  . CAP (community acquired pneumonia)   . Encephalopathy   . Encounter for central line placement   . Hypoxia   . Lactic acidosis     Expected Discharge Date: Expected Discharge Date: 06/30/15  Team Members Present: Physician leading conference: Dr. Alysia Penna Social Worker Present: [name] Alpers, LCSW Nurse Present:  [name] Roberts, RN PT Present: [name] [name], PT OT Present: [name] [name], OT SLP Present: [name] [name], SLP PPS Coordinator present : [name] Nakayama, RN, CRRN     Current Status/Progress Goal Weekly Team Focus  Medical   fearful with lunch yesterday, min A bathing and min/mod drsg  home taking po  swallow retraining, transition of TF   Bowel/Bladder   continent b&b; BRP q2hours per patient  maintain continence  mod i assist   Swallow/Nutrition/ Hydration   upgraded to dys 2, nectar thick liquids per MBS 3/7   supervision   diet toleration, recall and carryove of swallowing precautions    ADL's   min assist for UB bathing and dressing, min assist for toileting and for LB selfcare sit to stand.  RUE currently Brunnstrum stage IV to V, needs mod assist to utilize during selfcare tasks.    supervision to modified independent   selfcare retraining, transfer training, balance re-training, pt/family education, neuromuscular re-education   Mobility   min A gait with RW, min A transfers  supervision  balance, NMR, gait   Communication             Safety/Cognition/ Behavioral Observations  mild higher level cognitive deficits, steadily improving, anticipate that pt is near baseline   supervision   working memory    Pain   denies pain  maintain pain less than 3  keep pt pain free   Skin  small skin tear on sacral area; open to air; sacral foam on coccyx  keep pt free of skin breakdown  improvement of skin tear on bottom     Rehab Goals Patient on target to meet rehab goals: Yes Rehab Goals Revised: none *See Care Plan and progress notes for long and short-term goals.  Barriers to Discharge: See above    Possible Resolutions to Barriers:  Cont rehab    Discharge Planning/Teaching Needs:  Pt to d/c to her home with her husband to initially be with her 24/7 and then her friends/family to assist when husband is ready to go back to work.  Husband is here daily, but he will participate in  family education next week as we near d/c.   Team Discussion:  Pt is doing well and her swallowing is getting better.  Dr. Letta Pate plans to hold her tube feedings for a while to see if that helps her to be able to eat more due her diet being advanced on recent MBS.  Pt still has some swallowing needs that ST will continue to work on, but pt is working as hard as she can, including 3 or 4 hard swallows.  OT is working on pt's right arm and explained to pt that it is just taking her arm longer to come back than her leg.  Pt is progressing well toward PT goals.   Revisions to Treatment Plan:  none   Continued Need for Acute Rehabilitation Level of Care: The patient requires daily medical management by a physician with specialized training in physical medicine and rehabilitation for the following conditions: Daily direction of a multidisciplinary physical rehabilitation program to ensure safe treatment while eliciting the highest outcome that is of practical value to the patient.: Yes Daily medical management of patient stability for increased activity during participation in an intensive rehabilitation regime.: Yes Daily analysis of laboratory values and/or radiology reports with any subsequent need for medication adjustment of medical intervention for : Neurological problems;Mood/behavior problems  [name] [name], [name] [name] 06/24/2015, 11:20 AM

## 2015-06-24 NOTE — Progress Notes (Signed)
Subjective/Complaints: PEG feeding held Eating 50% meals 2 episodes of hematuria, painless with last urine clear Review of systems negative for chest pain, shortness of breath, nausea vomiting diarrhea constipation Objective: Vital Signs: Blood pressure 100/54, pulse 78, temperature 98 F (36.7 C), temperature source Oral, resp. rate 18, height 5' 1" (1.549 m), weight 53.7 kg (118 lb 6.2 oz), SpO2 100 %. Dg Swallowing Func-speech Pathology  06/22/2015  Objective Swallowing Evaluation: Type of Study: MBS-Modified Barium Swallow Study Patient Details Name: [name] [name] MRN: [id number] Date of Birth: 28-Jul-1955 Today's Date: 06/22/2015 Time: SLP Start Time: 0915-SLP Stop Time: 0936 SLP Time Calculation (min) (ACUTE ONLY): 21 min Past Medical History: Past Medical History Diagnosis Date . Hypothyroidism  . Seizures ([place])  . Adenopathy  . Scoliosis  . Spleen anomaly  . Anemia  . Sjogren's disease ([place])  . Raynaud disease  . Shingles  . Osteopenia  Past Surgical History: Past Surgical History Procedure Laterality Date . Tonsillectomy   . Breast cyst aspiration Right 1990's   neg HPI: [name] is a 60 y/o woman with a history of Sjogren's disease, hypothyroidism and dysphagia. She was diagnosed with the flu (A) about 4 days PTA and started on tamiflu. 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  Intubated from 2/12 to 2/21.There is a question of esophageal stricture; pt was seen by GI at [facility], complained of globus with pills and some solids, occuring 2-3x a week. Was scheduled for an EGD at [facility], but appears to have cancelled the procedure. 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  NPO per MBS on 2/23 and 2/28.  Repeat study ordered today per improved presentation at bedside.  Assessment / Plan / Recommendation CHL IP CLINICAL IMPRESSIONS 06/22/2015 Therapy Diagnosis  Moderate pharyngeal phase dysphagia;Moderate cervical esophageal phase dysphagia Clinical Impression Pt presents with continued improvements in overall swallowing function in comparison to previous study, largely due to her improved overall endurance (ambulating with PT), respiratory function (now on room air), and ability to effectively utilize compensatory strategies to clear penetration and pharyngeal residue.  Pt continues to present with moderate sensorimotor impairments resulting in delay in swallow response to the level of the vallecula as well as weakened anterior hyolaryngeal movement, decreased base of tongue retraction, and decreased epiglottic inversion which leads to decreased laryngeal closure and moderate pharyngeal residuals at posterior pharyngeal wall, vallecula, and pyriforms.  Delay in swallow response in combination with pharyngeal residuals impacting bolus flow resulted in penetration during the swallow with nectar thick liquids which cleared with cues for multiple swallows and hard cough.  Penetration with thin liquids contacted the cords and pt was able to clear most but not all penetrates.   Pharyngeal residuals were reduced from moderate to mild with pt's self initiated use of multiple effortful swallows.  Recommend initiating diet of Dys 2 textures, nectar thick liquids; meds via PEG with full supervision for use of swallowing precautions: multiple effortful swallows after each bite/sip; hard cough after each sip followed by effortful swallow.   Impact on safety and function Mild aspiration risk     Prognosis 06/22/2015 Prognosis for Safe Diet Advancement Good Barriers to Reach Goals -- Barriers/Prognosis Comment -- CHL IP DIET RECOMMENDATION 06/22/2015 SLP Diet Recommendations Dysphagia 2 (Fine chop) solids;Nectar thick liquid Liquid Administration via Cup Medication Administration Via alternative means Compensations Slow rate;Small sips/bites;Multiple dry swallows after each bite/sip;Hard  cough after swallow;Effortful swallow Postural Changes --            CHL IP ORAL PHASE 06/22/2015 Oral Phase WFL Oral - Pudding Teaspoon -- Oral - Pudding Cup -- Oral - Honey Teaspoon -- Oral - Honey Cup -- Oral - Nectar Teaspoon -- Oral - Nectar Cup -- Oral - Nectar Straw -- Oral - Thin Teaspoon -- Oral - Thin Cup -- Oral - Thin Straw -- Oral - Puree -- Oral - Mech Soft -- Oral - Regular -- Oral - Multi-Consistency -- Oral - Pill -- Oral Phase - Comment --  CHL IP PHARYNGEAL PHASE 06/22/2015 Pharyngeal Phase -- Pharyngeal- Pudding Teaspoon -- Pharyngeal -- Pharyngeal- Pudding Cup -- Pharyngeal -- Pharyngeal- Honey Teaspoon -- Pharyngeal -- Pharyngeal- Honey Cup -- Pharyngeal -- Pharyngeal- Nectar Teaspoon Delayed swallow initiation-vallecula;Reduced epiglottic inversion;Reduced anterior laryngeal mobility;Reduced airway/laryngeal closure;Reduced tongue base retraction;Pharyngeal residue - valleculae;Pharyngeal residue - pyriform;Pharyngeal residue - posterior pharnyx Pharyngeal -- Pharyngeal- Nectar Cup Delayed swallow initiation-vallecula;Reduced epiglottic inversion;Reduced anterior laryngeal mobility;Reduced airway/laryngeal closure;Reduced tongue base retraction;Penetration/Aspiration during swallow;Pharyngeal residue - valleculae;Pharyngeal residue - pyriform;Pharyngeal residue - posterior pharnyx Pharyngeal Material enters airway, remains ABOVE vocal cords then ejected out Pharyngeal- Nectar Straw -- Pharyngeal -- Pharyngeal- Thin Teaspoon Delayed swallow initiation-vallecula;Reduced epiglottic inversion;Reduced anterior laryngeal mobility;Reduced airway/laryngeal closure;Reduced tongue base retraction;Penetration/Aspiration during swallow;Pharyngeal residue - valleculae;Pharyngeal residue - pyriform;Pharyngeal residue - posterior pharnyx Pharyngeal Material enters airway, CONTACTS cords and then ejected out Pharyngeal- Thin Cup Delayed swallow initiation-vallecula;Reduced epiglottic inversion;Reduced anterior  laryngeal mobility;Reduced airway/laryngeal closure;Reduced tongue base retraction;Penetration/Aspiration during swallow;Pharyngeal residue - pyriform;Pharyngeal residue - valleculae;Pharyngeal residue - posterior pharnyx Pharyngeal Material enters airway, CONTACTS cords and not ejected out Pharyngeal- Thin Straw -- Pharyngeal -- Pharyngeal- Puree Delayed swallow initiation-vallecula;Reduced epiglottic inversion;Reduced anterior laryngeal mobility;Reduced airway/laryngeal closure;Reduced tongue base retraction;Pharyngeal residue - valleculae;Pharyngeal residue - pyriform;Pharyngeal residue - posterior pharnyx Pharyngeal -- Pharyngeal- Mechanical Soft -- Pharyngeal -- Pharyngeal- Regular Delayed swallow initiation-vallecula;Reduced epiglottic inversion;Reduced anterior laryngeal mobility;Reduced airway/laryngeal closure;Reduced tongue base retraction;Pharyngeal residue - valleculae;Pharyngeal residue - pyriform;Pharyngeal residue - posterior pharnyx Pharyngeal -- Pharyngeal- Multi-consistency -- Pharyngeal -- Pharyngeal- Pill -- Pharyngeal -- Pharyngeal Comment --  CHL IP CERVICAL ESOPHAGEAL PHASE 06/15/2015 Cervical Esophageal Phase Impaired Pudding Teaspoon -- Pudding Cup -- Honey Teaspoon -- Honey Cup -- Nectar Teaspoon Reduced cricopharyngeal relaxation Nectar Cup -- Nectar Straw -- Thin Teaspoon Reduced cricopharyngeal relaxation Thin Cup -- Thin Straw -- Puree -- Mechanical Soft -- Regular -- Multi-consistency -- Pill -- Cervical Esophageal Comment -- No flowsheet data found. Page, Selinda Orion 06/22/2015, 11:03 AM              Results for orders placed or performed during the hospital encounter of 06/16/15 (from the past 72 hour(s))  Glucose, capillary     Status: Abnormal   Collection Time: 06/21/15 11:48 AM  Result Value Ref Range   Glucose-Capillary 182 (H) 65 - 99 mg/dL  Glucose, capillary     Status: Abnormal   Collection Time: 06/21/15  2:21 PM  Result Value Ref Range   Glucose-Capillary 132 (H) 65 -  99 mg/dL  Glucose, capillary     Status: Abnormal   Collection Time: 06/21/15  4:13 PM  Result Value Ref Range   Glucose-Capillary 153 (H) 65 - 99 mg/dL  Glucose, capillary     Status: Abnormal   Collection Time: 06/21/15  7:52 PM  Result Value Ref Range   Glucose-Capillary 114 (H) 65 - 99 mg/dL  Glucose, capillary     Status: Abnormal   Collection  Time: 06/21/15 11:55 PM  Result Value Ref Range   Glucose-Capillary 113 (H) 65 - 99 mg/dL  Glucose, capillary     Status: Abnormal   Collection Time: 06/22/15  4:24 AM  Result Value Ref Range   Glucose-Capillary 112 (H) 65 - 99 mg/dL  Glucose, capillary     Status: Abnormal   Collection Time: 06/22/15  8:27 AM  Result Value Ref Range   Glucose-Capillary 174 (H) 65 - 99 mg/dL  Glucose, capillary     Status: Abnormal   Collection Time: 06/22/15 12:18 PM  Result Value Ref Range   Glucose-Capillary 132 (H) 65 - 99 mg/dL  Glucose, capillary     Status: Abnormal   Collection Time: 06/22/15  4:43 PM  Result Value Ref Range   Glucose-Capillary 121 (H) 65 - 99 mg/dL  Glucose, capillary     Status: Abnormal   Collection Time: 06/22/15  7:59 PM  Result Value Ref Range   Glucose-Capillary 145 (H) 65 - 99 mg/dL   Comment 1 Notify RN   Glucose, capillary     Status: Abnormal   Collection Time: 06/23/15 12:04 AM  Result Value Ref Range   Glucose-Capillary 117 (H) 65 - 99 mg/dL  Glucose, capillary     Status: Abnormal   Collection Time: 06/23/15  3:56 AM  Result Value Ref Range   Glucose-Capillary 125 (H) 65 - 99 mg/dL  Creatinine, serum     Status: None   Collection Time: 06/23/15  5:00 AM  Result Value Ref Range   Creatinine, Ser 0.60 0.44 - 1.00 mg/dL   GFR calc non Af Amer >60 >60 mL/min   GFR calc Af Amer >60 >60 mL/min    Comment: (NOTE) The eGFR has been calculated using the CKD EPI equation. This calculation has not been validated in all clinical situations. eGFR's persistently <60 mL/min signify possible Chronic  Kidney Disease.   Glucose, capillary     Status: Abnormal   Collection Time: 06/23/15  8:20 AM  Result Value Ref Range   Glucose-Capillary 146 (H) 65 - 99 mg/dL  Glucose, capillary     Status: Abnormal   Collection Time: 06/23/15 12:07 PM  Result Value Ref Range   Glucose-Capillary 141 (H) 65 - 99 mg/dL  CBC     Status: Abnormal   Collection Time: 06/24/15  4:45 AM  Result Value Ref Range   WBC 9.9 4.0 - 10.5 K/uL   RBC 3.08 (L) 3.87 - 5.11 MIL/uL   Hemoglobin 9.3 (L) 12.0 - 15.0 g/dL   HCT 29.6 (L) 36.0 - 46.0 %   MCV 96.1 78.0 - 100.0 fL   MCH 30.2 26.0 - 34.0 pg   MCHC 31.4 30.0 - 36.0 g/dL   RDW 19.8 (H) 11.5 - 15.5 %   Platelets 633 (H) 150 - 400 K/uL      General: No acute distress Mood and affect are appropriate Heart: Regular rate and rhythm no rubs murmurs or extra sounds Lungs: Clear to auscultation, breathing unlabored, no rales or wheezes, decreased breath sounds at bases Abdomen: Positive bowel sounds, soft nontender to palpation, nondistended Extremities: No clubbing, cyanosis, or edema Skin: No evidence of breakdown, no evidence of rash Neurologic: Cranial nerves II through XII intact, motor strength is 5/5 in left deltoid, bicep, tricep, grip, hip flexor, knee extensors, ankle dorsiflexor and plantar flexor, 3 minus right deltoid, bicep, tricep, grip,, 4 minus right hip flexor and knee extensor and 2 minus ankle dorsiflexor Sensory exam normal sensation to  light touch in bilateral upper and lower extremities Oriented to person place and time   Musculoskeletal: Full passive range  of motion in all 4 extremities. No joint swelling   Assessment/Plan: 1. Functional deficits secondary to Right side weakness/dysphagia secondary to left frontal lobe infarct complicated by respiratory failure from septic shock. which require 3+ hours per day of interdisciplinary therapy in a comprehensive inpatient rehab setting. Physiatrist is providing close team supervision and 24  hour management of active medical problems listed below. Physiatrist and rehab team continue to assess barriers to discharge/monitor patient progress toward functional and medical goals. FIM: Function - Bathing Position: Wheelchair/chair at sink Body parts bathed by patient: Right arm, Front perineal area, Buttocks Body parts bathed by helper: Left lower leg, Right lower leg, Back, Buttocks, Front perineal area, Left arm, Chest, Abdomen (hand over hand assist with the RUE and wash mit) Assist Level: Touching or steadying assistance(Pt > 75%)  Function- Upper Body Dressing/Undressing What is the patient wearing?: Pull over shirt/dress Pull over shirt/dress - Perfomed by patient: Put head through opening, Pull shirt over trunk Pull over shirt/dress - Perfomed by helper: Thread/unthread right sleeve, Thread/unthread left sleeve Assist Level: Supervision or verbal cues Function - Lower Body Dressing/Undressing What is the patient wearing?: Underwear, Ted Hose, Pants, Socks (therapist had to assist with all LB dressing secondary to decreased time. ) Position: Wheelchair/chair at sink Underwear - Performed by patient: Thread/unthread right underwear leg, Thread/unthread left underwear leg Underwear - Performed by helper: Thread/unthread right underwear leg, Thread/unthread left underwear leg, Pull underwear up/down Pants- Performed by patient: Thread/unthread right pants leg, Thread/unthread left pants leg Pants- Performed by helper: Thread/unthread right pants leg, Thread/unthread left pants leg, Pull pants up/down, Fasten/unfasten pants Socks - Performed by patient: Don/doff left sock Socks - Performed by helper: Don/doff right sock Shoes - Performed by patient: Don/doff right shoe, Don/doff left shoe Shoes - Performed by helper: Fasten right, Fasten left TED Hose - Performed by helper: Don/doff right TED hose, Don/doff left TED hose Assist for footwear: Supervision/touching assist Assist for  lower body dressing: Touching or steadying assistance (Pt > 75%)  Function - Toileting Toileting steps completed by patient: Adjust clothing after toileting, Adjust clothing prior to toileting, Performs perineal hygiene Toileting steps completed by helper: Adjust clothing prior to toileting, Performs perineal hygiene Toileting Assistive Devices: Grab bar or rail Assist level: Touching or steadying assistance (Pt.75%)  Function - Toilet Transfers Toilet transfer assistive device: Grab bar Assist level to toilet: Touching or steadying assistance (Pt > 75%) Assist level from toilet: Touching or steadying assistance (Pt > 75%) Assist level to bedside commode (at bedside): Touching or steadying assistance (Pt > 75%) Assist level from bedside commode (at bedside): Touching or steadying assistance (Pt > 75%)  Function - Chair/bed transfer Chair/bed transfer method: Stand pivot Chair/bed transfer assist level: Touching or steadying assistance (Pt > 75%) Chair/bed transfer assistive device: Walker Chair/bed transfer details: Verbal cues for sequencing, Verbal cues for technique, Verbal cues for precautions/safety, Manual facilitation for weight shifting, Manual facilitation for placement  Function - Locomotion: Wheelchair Will patient use wheelchair at discharge?: No Type: Manual Max wheelchair distance: 150 Assist Level: Supervision or verbal cues Assist Level: Supervision or verbal cues Wheel 150 feet activity did not occur: Safety/medical concerns Assist Level: Supervision or verbal cues Turns around,maneuvers to table,bed, and toilet,negotiates 3% grade,maneuvers on rugs and over doorsills: No Function - Locomotion: Ambulation Assistive device: No device Max distance: 300 Assist level: Touching or steadying assistance (  Pt > 75%) Assist level: Touching or steadying assistance (Pt > 75%) Walk 50 feet with 2 turns activity did not occur: Safety/medical concerns Assist level: Touching or  steadying assistance (Pt > 75%) Walk 150 feet activity did not occur: Safety/medical concerns Assist level: Touching or steadying assistance (Pt > 75%) Walk 10 feet on uneven surfaces activity did not occur: Safety/medical concerns Assist level: Touching or steadying assistance (Pt > 75%)  Function - Comprehension Comprehension: Auditory Comprehension assist level: Follows basic conversation/direction with no assist  Function - Expression Expression: Verbal Expression assist level: Expresses basic needs/ideas: With extra time/assistive device  Function - Social Interaction Social Interaction assist level: Interacts appropriately 75 - 89% of the time - Needs redirection for appropriate language or to initiate interaction.  Function - Problem Solving Problem solving assist level: Solves basic 90% of the time/requires cueing < 10% of the time  Function - Memory Memory assist level: Recognizes or recalls 90% of the time/requires cueing < 10% of the time Patient normally able to recall (first 3 days only): Current season, Location of own room, Staff names and faces, That he or she is in a hospital  Medical Problem List and Plan: 1.  Right side weakness/dysphagia secondary to left frontal lobe infarct complicated by respiratory failure,   2.  DVT Prophylaxis/Anticoagulation: SCDs. Monitor for any signs of DVT 3. Pain Management: Tylenol as needed 4. Dysphagia/esophageal stricture. Status post gastrostomy tube 06/14/2015 per interventional radiology. Speech therapy follow-up. Plan outpatient EGD endoscopy for dilatation of stricture, swallowing dysfunction improving when comparing the modified barium swallow performed 06/09/2015 versus 06/15/2015,Now on D2 Nectar, 5. Neuropsych: This patient is capable of making decisions on her own behalf. 6. Skin/Wound Care: Routine skin checks, gastrostomy tube site looks okay except has some dried blood. Will write for daily normal saline rinses 7.  Fluids/Electrolytes/Nutrition: Routine I&O's with follow-up chemistries 8. ID. Strep pyogenes bacteremia. Followed by infectious disease. Continue ceftriaxone questions 6 week course and confirm with Dr. VanDam. Maintain contact precautions 9. Atrial flutter with RVR. Lopressor 6.25 mg twice a day,100/54,78 10. Rheumatoid arthritis/Sjorgens disease. Discuss resuming plaquenil and chronic prednisone.no active joint inflammation at the current time 11. Hyperglycemia secondary to chronic prednisone/tube feeds. Hemoglobin A1c 6.6.141 this am12. Hyperlipidemia. Lipitor, patient has elevated liver function tests, we'll reduce Lipitor from 40 mg to 20 mg and recheck C met  in 1-2 weeks 13. Hypothyroidism. Resume Synthroid 14. Elevated LFTs, had recent gallbladder ultrasound which was negative. Cholelithiasis but no clinical evidence of cholecystitis 15.  Hematuria- check UA, Hgb looks in range vs prior  CBC Latest Ref Rng 06/24/2015 06/17/2015 06/16/2015  WBC 4.0 - 10.5 K/uL 9.9 5.9 5.8  Hemoglobin 12.0 - 15.0 g/dL 9.3(L) 10.2(L) 9.4(L)  Hematocrit 36.0 - 46.0 % 29.6(L) 34.4(L) 30.8(L)  Platelets 150 - 400 K/uL 633(H) 723(H) 707(H)    LOS (Days) 8 A FACE TO FACE EVALUATION WAS PERFORMED  , E 06/24/2015, 8:30 AM    

## 2015-06-24 NOTE — Research (Cosign Needed)
A Randomized, Open-Label Phase1B study of ME110 Inhlation Solution Plus Best Available Therapy in the Treatment of Mechanically Ventilated Subjects with Bacterial Pneumonia (ME1100-CL-103)  This patient, [name] [name], has provided self re-consent to the above clinical trial according to FDA regulations, GCP guidelines and [employer] SOPs. The study design had previously been explained to this patient's LAR, Husband Malachi Pro, by this RN and Dr. Merrie Roof, MD.  The patient was unable to consent for self at the time of original consent due to critical illness.  Patient is now able to make decisions for self and has been informed about the study.  This RN has reviewed the study and informed consent form with this patient. This patient was given sufficient time for reading the consent and asking questions. All risks, benefits and options have been thoroughly discussed. This patient was not coerced in any way to continue participation in this clinical trial. The patient has signed voluntarily at 16:30 on 24 June 2015. This patient was given a copy of this consent.   Doreatha Martin, RN  [phone]

## 2015-06-24 NOTE — Progress Notes (Signed)
Occupational Therapy Session Note  Patient Details  Name: [name] [name] MRN: [id number] Date of Birth: 05-Feb-1956  Today's Date: 06/24/2015 OT Individual Time: 0901-1000 OT Individual Time Calculation (min): 59 min    Short Term Goals: Week 2:  OT Short Term Goal 1 (Week 2): Pt will continue working toward established LTGs set at supervision level overall.  Skilled Therapeutic Interventions/Progress Updates:   Pt was working on eating as therapist entered,  but decided to hold off in order to focus on bathing and dressing tasks.  She stated she needed to use the toilet so had her ambulate to the bathroom with min guard assist.  She managed clothing and hygiene with min guard assist with also placing the RUE on the grab bar for weightbearing and to help steady her during clothing management.  She was able to complete hygiene in sitting with lateral lean to the left.  Once finished had her remove LB clothing since she was going to be ambulating to the sink for bathing and would not need to put dirty clothing back on.  She was able to transfer to the sink and work on bathing.  Focused on using RUE with adaptive wash mit for most bathing.  Mod assist needed to help control RUE at the elbow and hand.  Min assist with min instructional cueing for hand placement on the wheelchair arm during sit to stand for LB selfcare.  Min assist needed to help thread the shirt sleeve on the left side secondary to PICC line.  She was able to complete most of LB dressing using the LUE with min guard assist and placing the RUE on the sink in standing for weightbearing tasks.  Utilized shoe buttons for compensation as she cannot currently tie them.  Pt still needing min instructional cueing to remember to lock the wheelchair brakes at times.  Also noted pt with some difficulty with anticipatory awareness in that she thought she may be able to finish her breakfast when she went down with the PT during next session.  Cued pt that  this would likely not be possible.  She was positioned in wheelchair where another OT came in to assist with finishing breakfast.       Therapy Documentation Precautions:  Precautions Precautions: Fall Precaution Comments: R hemiplegia UE more impaired than LE Required Braces or Orthoses: Sling Restrictions Weight Bearing Restrictions: No  Pain: Pain Assessment Pain Assessment: No/denies pain ADL: See Function Navigator for Current Functional Status.   Therapy/Group: Individual Therapy  Nahlia Hellmann OTR/L 06/24/2015, 12:24 PM

## 2015-06-25 ENCOUNTER — Inpatient Hospital Stay (HOSPITAL_COMMUNITY): Payer: BC Managed Care – PPO | Admitting: Speech Pathology

## 2015-06-25 ENCOUNTER — Inpatient Hospital Stay (HOSPITAL_COMMUNITY): Payer: BC Managed Care – PPO | Admitting: Occupational Therapy

## 2015-06-25 ENCOUNTER — Inpatient Hospital Stay (HOSPITAL_COMMUNITY): Payer: BC Managed Care – PPO | Admitting: Physical Therapy

## 2015-06-25 ENCOUNTER — Encounter (HOSPITAL_COMMUNITY): Payer: BC Managed Care – PPO

## 2015-06-25 DIAGNOSIS — R319 Hematuria, unspecified: Secondary | ICD-10-CM

## 2015-06-25 LAB — BASIC METABOLIC PANEL
ANION GAP: 10 (ref 5–15)
BUN: 14 mg/dL (ref 6–20)
CALCIUM: 9.2 mg/dL (ref 8.9–10.3)
CHLORIDE: 101 mmol/L (ref 101–111)
CO2: 24 mmol/L (ref 22–32)
Creatinine, Ser: 0.73 mg/dL (ref 0.44–1.00)
GFR calc Af Amer: >60 mL/min (ref >60)
GFR calc non Af Amer: >60 mL/min (ref >60)
GLUCOSE: 178 mg/dL — AB (ref 65–99)
Potassium: 4 mmol/L (ref 3.5–5.1)
Sodium: 135 mmol/L (ref 135–145)

## 2015-06-25 MED ORDER — FLUCONAZOLE 100 MG PO TABS
100.0000 mg | ORAL_TABLET | Freq: Every day | ORAL | Status: DC
  Administered 2015-06-25 – 2015-06-30 (×6): 100 mg via ORAL
  Filled 2015-06-25 (×6): qty 1

## 2015-06-25 NOTE — Progress Notes (Addendum)
Physical Therapy Session Note  Patient Details  Name: [name] [name] MRN: [id number] Date of Birth: 1955/05/04  Today's Date: 06/25/2015 PT Individual Time: 1432-1525 PT Individual Time Calculation (min): 53 min   Short Term Goals: Week 2:  =LTGs due to ELOS  Skilled Therapeutic Interventions/Progress Updates:    Pt received resting in w/c with no c/o pain and agreeable to therapy session.  Pt amb to therapy gym with [facility] supervision.  Kinetron in seated position x5 minutes at 40 cm/s for warm up.  Sit<>stand on kinetron with resistance at 70 cm/s focus on equal weight bearing through LEs.  Standing balance in kinetron focus on equal weight bearing through LEs reaching for horseshoes.  Pt transitioned off kinetron with close supervision.  PT instructed pt in OTAGO level C exercises and pt performed 10 reps of each (repeated sit<>stand, LAQ, minisquats, heel/toe raises, hamstring curls, hip abd, SLS, tandem walking, heel walking, toe walking, figure 8 walking, and side stepping).  Pt returned to room at end of session and positioned in w/c with call bell in reach and needs met.   Therapy Documentation Precautions:  Precautions Precautions: Fall Precaution Comments: R hemiplegia UE more impaired than LE Required Braces or Orthoses: Sling Restrictions Weight Bearing Restrictions: No   See Function Navigator for Current Functional Status.   Therapy/Group: Individual Therapy  Earnest Conroy Penven-Crew 06/25/2015, 4:44 PM

## 2015-06-25 NOTE — Progress Notes (Signed)
Physical Therapy Weekly Progress Note  Patient Details  Name: [name] [name] MRN: [id number] Date of Birth: July 21, 1955  Beginning of progress report period: June 17, 2015 End of progress report period: June 25, 2015   Patient has met 3 of 5 short term goals.  Pt has improved gait, balance and activity tolerance. Still with weakness and decreased standing tolerance. Will continue to benefit from skilled PT for increased strength, balance and endurance.  Patient continues to demonstrate the following deficits: impaired balance, gait and strength and therefore will continue to benefit from skilled PT intervention to enhance overall performance with activity tolerance, balance, ability to compensate for deficits, functional use of  right upper extremity and coordination.  Patient progressing toward long term goals..  Continue plan of care.  PT Short Term Goals Week 1:  PT Short Term Goal 1 (Week 1): Pt will perform all bed mobiltiy with S, including UE management PT Short Term Goal 1 - Progress (Week 1): Met PT Short Term Goal 2 (Week 1): Pt will perform WC<>bed transfers with Min A consistently PT Short Term Goal 2 - Progress (Week 1): Met PT Short Term Goal 3 (Week 1): Pt will ambulate x50' with RW and Min A PT Short Term Goal 3 - Progress (Week 1): Met PT Short Term Goal 4 (Week 1): Pt will perform 12 stairs with Min A  PT Short Term Goal 4 - Progress (Week 1): Progressing toward goal PT Short Term Goal 5 (Week 1): Pt will demonstrate static standing balance x5 min for functional tasks with close S PT Short Term Goal 5 - Progress (Week 1): Progressing toward goal  Skilled Therapeutic Interventions/Progress Updates:  Ambulation/gait training;Balance/vestibular training;Community reintegration;Cognitive remediation/compensation;Discharge planning;Disease management/prevention;DME/adaptive equipment instruction;Functional mobility training;Neuromuscular re-education;Pain  management;Patient/family education;Psychosocial support;Skin care/wound management;Splinting/orthotics;Stair training;Therapeutic Activities;Therapeutic Exercise;UE/LE Strength taining/ROM;UE/LE Coordination activities;Wheelchair propulsion/positioning     See Function Navigator for Current Functional Status.   Angelia Hazell 06/25/2015, 7:14 AM

## 2015-06-25 NOTE — Progress Notes (Signed)
Speech Language Pathology Daily Session Note  Patient Details  Name: [name] [name] MRN: [id number] Date of Birth: Jul 25, 1955  Today's Date: 06/25/2015 SLP Individual Time: 0900-0930 SLP Individual Time Calculation (min): 30 min  Short Term Goals: Week 2: SLP Short Term Goal 1 (Week 2): Pt will utilize compensatory memory strategies to facilitate recall of complex information wtih supervision verbal cues.   SLP Short Term Goal 2 (Week 2): Pt will complete semi-complex functional home management tasks for >80% accuracy at mod I for increased time/use of compensatory strategies. SLP Short Term Goal 3 (Week 2): Pt will tolerate trials of dysphagia 3 textures without s/s aspiration or anxiety. SLP Short Term Goal 4 (Week 2): Pt will complete pharyngeal strengthening exercises at mod I.  Skilled Therapeutic Interventions: Skilled treatment session focused on dysphagia goals. Patient consumed her breakfast meal of Dys. 2 textures with nectar-thick liquids via cup without overt s/s of aspiration and required intermittent supervision verbal cues for use of swallowing compensatory strategies. Patient also independently requested to order her lunch and dinner meals via phone and performed task with verbal cue X 1 to recall her room number. Patient left upright in wheelchair with all needs within reach. Continue with current plan of care.    Function:  Eating Eating   Modified Consistency Diet: Yes Eating Assist Level: Supervision or verbal cues           Cognition Comprehension Comprehension assist level: Understands complex 90% of the time/cues 10% of the time  Expression   Expression assist level: Expresses complex 90% of the time/cues < 10% of the time  Social Interaction Social Interaction assist level: Interacts appropriately 90% of the time - Needs monitoring or encouragement for participation or interaction.  Problem Solving Problem solving assist level: Solves basic problems with no  assist  Memory Memory assist level: Recognizes or recalls 90% of the time/requires cueing < 10% of the time    Pain Pain Assessment Pain Assessment: No/denies pain  Therapy/Group: Individual Therapy  [name] [name] 06/25/2015, 10:51 AM

## 2015-06-25 NOTE — Progress Notes (Signed)
Physical Therapy Session Note  Patient Details  Name: [name] [name] MRN: [id number] Date of Birth: 01/14/1956  Today's Date: 06/25/2015 PT Individual Time: 1000-1055 PT Individual Time Calculation (min): 55 min    Therapy Documentation Pain: Pain Assessment Pain Assessment: No/denies pain   Pt performed gait with RW with supervision 150' in controlled environment with no LOB.  Trial of SPC for gait. Pt able to gait with good technique with SPC with supervision, no LOB in controlled environment and with obstacle negotiation.  Berg balance test re tested, pt improved from 19/56 on eval to 35/56. Significant improvement but pt still a risk for falls. Pt educated on meaning of test score, she expressed understanding.  Standing balance with tapping 6'' step, static stance with R LE bias and LE movement and reaching with min A. Pt improving balance but limited by R LE fatigue.  Pt to trial use of SPC for ADLs and determine if pt is safe to progress to [facility] for gait at this time.  Balance: Berg Balance Test Sit to Stand: Able to stand without using hands and stabilize independently Standing Unsupported: Able to stand safely 2 minutes Sitting with Back Unsupported but Feet Supported on Floor or Stool: Able to sit safely and securely 2 minutes Stand to Sit: Controls descent by using hands Transfers: Able to transfer safely, definite need of hands Standing Unsupported with Eyes Closed: Able to stand 10 seconds with supervision Standing Ubsupported with Feet Together: Able to place feet together independently and stand for 1 minute with supervision From Standing, Reach Forward with Outstretched Arm: Reaches forward but needs supervision From Standing Position, Pick up Object from Floor: Able to pick up shoe, needs supervision From Standing Position, Turn to Look Behind Over each Shoulder: Turn sideways only but maintains balance Turn 360 Degrees: Needs close supervision or verbal cueing Standing  Unsupported, Alternately Place Feet on Step/Stool: Able to complete >2 steps/needs minimal assist Standing Unsupported, One Foot in Front: Able to take small step independently and hold 30 seconds Standing on One Leg: Tries to lift leg/unable to hold 3 seconds but remains standing independently Total Score: 35   See Function Navigator for Current Functional Status.   Therapy/Group: Individual Therapy  Lilienne Weins 06/25/2015, 10:56 AM

## 2015-06-25 NOTE — Progress Notes (Signed)
Occupational Therapy Session Note  Patient Details  Name: [name] [name] MRN: [id number] Date of Birth: [birth year]  Today's Date: 06/25/2015 OT Individual Time: 1100-1158 OT Individual Time Calculation (min): 58 min    Short Term Goals: Week 2:  OT Short Term Goal 1 (Week 2): Pt will continue working toward established LTGs set at supervision level overall.  Skilled Therapeutic Interventions/Progress Updates:    Pt worked on washing her hair to begin session as she had voiced wanting to do this yesterday, but it was not able to be completed during session.  Had her standing at the sink with the RUE in weightbearing and trunk flexed in order to wet her hair using a small cup.  She was able to maintain standing balance with close supervision, with occasional min assist to maintain RUE positioned on the sink.  Integrated the RUE for holding shampoo and pouring into her hand with mod assist.  She was able to maintain standing and use the LUE for washing and for rinsing out soap thoroughly.  Transitioned to sitting after greater than 15 mins of standing to use hair dryer and brush.  Integrated the RUE with max hand over hand assist to hold the brush and brush her hair.  Discussed adapting the handle of the brush to increase diameter for gripping or checking into getting a larger handle brush as well.  Once this was completed pt performed some bathing of her face, underarms, peri area before donning new clothing.  She completed all bathing in standing with transition to sitting for dressing.  Close supervision for donning underpants, pants, and shoes.  Min assist required for donning pullover shirt secondary to PICC line, and max for tying shoes as they were undone from the shoe buttons.  Pt left in wheelchair at bedside with 1/2 lap tray in place and call button and phone within reach.  Pt instructed to continue working on RUE gross digit flexion and extension as well as elbow flexion/ extension and shoulder  flexion AAROM over the weekend.    Therapy Documentation Precautions:  Precautions Precautions: Fall Precaution Comments: R hemiplegia UE more impaired than LE Required Braces or Orthoses: Sling Restrictions Weight Bearing Restrictions: No  Pain: Pain Assessment Pain Assessment: No/denies pain ADL: See Function Navigator for Current Functional Status.   Therapy/Group: Individual Therapy  Ottie Tillery OTR/L 06/25/2015, 12:32 PM

## 2015-06-25 NOTE — Progress Notes (Signed)
Subjective/Complaints: PEG feeding held Eating 50% meals Intermittent  hematuria, painless Review of systems negative for chest pain, shortness of breath, nausea vomiting diarrhea constipation Objective: Vital Signs: Blood pressure 88/55, pulse 74, temperature 98.4 F (36.9 C), temperature source Oral, resp. rate 18, height 5' 1"  (1.549 m), weight 53.64 kg (118 lb 4.1 oz), SpO2 98 %. No results found. Results for orders placed or performed during the hospital encounter of 06/16/15 (from the past 72 hour(s))  Glucose, capillary     Status: Abnormal   Collection Time: 06/22/15 12:18 PM  Result Value Ref Range   Glucose-Capillary 132 (H) 65 - 99 mg/dL  Glucose, capillary     Status: Abnormal   Collection Time: 06/22/15  4:43 PM  Result Value Ref Range   Glucose-Capillary 121 (H) 65 - 99 mg/dL  Glucose, capillary     Status: Abnormal   Collection Time: 06/22/15  7:59 PM  Result Value Ref Range   Glucose-Capillary 145 (H) 65 - 99 mg/dL   Comment 1 Notify RN   Glucose, capillary     Status: Abnormal   Collection Time: 06/23/15 12:04 AM  Result Value Ref Range   Glucose-Capillary 117 (H) 65 - 99 mg/dL  Glucose, capillary     Status: Abnormal   Collection Time: 06/23/15  3:56 AM  Result Value Ref Range   Glucose-Capillary 125 (H) 65 - 99 mg/dL  Creatinine, serum     Status: None   Collection Time: 06/23/15  5:00 AM  Result Value Ref Range   Creatinine, Ser 0.60 0.44 - 1.00 mg/dL   GFR calc non Af Amer >60 >60 mL/min   GFR calc Af Amer >60 >60 mL/min    Comment: (NOTE) The eGFR has been calculated using the CKD EPI equation. This calculation has not been validated in all clinical situations. eGFR's persistently <60 mL/min signify possible Chronic Kidney Disease.   Glucose, capillary     Status: Abnormal   Collection Time: 06/23/15  8:20 AM  Result Value Ref Range   Glucose-Capillary 146 (H) 65 - 99 mg/dL  Glucose, capillary     Status: Abnormal   Collection Time: 06/23/15  12:07 PM  Result Value Ref Range   Glucose-Capillary 141 (H) 65 - 99 mg/dL  CBC     Status: Abnormal   Collection Time: 06/24/15  4:45 AM  Result Value Ref Range   WBC 9.9 4.0 - 10.5 K/uL   RBC 3.08 (L) 3.87 - 5.11 MIL/uL   Hemoglobin 9.3 (L) 12.0 - 15.0 g/dL   HCT 29.6 (L) 36.0 - 46.0 %   MCV 96.1 78.0 - 100.0 fL   MCH 30.2 26.0 - 34.0 pg   MCHC 31.4 30.0 - 36.0 g/dL   RDW 19.8 (H) 11.5 - 15.5 %   Platelets 633 (H) 150 - 400 K/uL  Urinalysis, Routine w reflex microscopic (not at [facility])     Status: Abnormal   Collection Time: 06/24/15 10:15 AM  Result Value Ref Range   Color, Urine YELLOW YELLOW   APPearance TURBID (A) CLEAR   Specific Gravity, Urine 1.014 1.005 - 1.030   pH 8.0 5.0 - 8.0   Glucose, UA NEGATIVE NEGATIVE mg/dL   Hgb urine dipstick LARGE (A) NEGATIVE   Bilirubin Urine NEGATIVE NEGATIVE   Ketones, ur NEGATIVE NEGATIVE mg/dL   Protein, ur NEGATIVE NEGATIVE mg/dL   Nitrite NEGATIVE NEGATIVE   Leukocytes, UA TRACE (A) NEGATIVE  Urine microscopic-add on     Status: Abnormal   Collection  Time: 06/24/15 10:15 AM  Result Value Ref Range   Squamous Epithelial / LPF 0-5 (A) NONE SEEN   WBC, UA 0-5 0 - 5 WBC/hpf   RBC / HPF TOO NUMEROUS TO COUNT 0 - 5 RBC/hpf   Bacteria, UA FEW (A) NONE SEEN   Urine-Other YEAST PRESENT     Comment: AMORPHOUS URATES/PHOSPHATES  Glucose, capillary     Status: None   Collection Time: 06/24/15  8:13 PM  Result Value Ref Range   Glucose-Capillary 99 65 - 99 mg/dL      General: No acute distress Mood and affect are appropriate Heart: Regular rate and rhythm no rubs murmurs or extra sounds Lungs: Clear to auscultation, breathing unlabored, no rales or wheezes, decreased breath sounds at bases Abdomen: Positive bowel sounds, soft nontender to palpation, nondistended Extremities: No clubbing, cyanosis, or edema Skin: No evidence of breakdown, no evidence of rash Neurologic: Cranial nerves II through XII intact, motor strength is 5/5 in  left deltoid, bicep, tricep, grip, hip flexor, knee extensors, ankle dorsiflexor and plantar flexor, 3 minus right deltoid, bicep, tricep, grip,, 4 minus right hip flexor and knee extensor and 2 minus ankle dorsiflexor Sensory exam normal sensation to light touch in bilateral upper and lower extremities Oriented to person place and time   Musculoskeletal: Full passive range  of motion in all 4 extremities. No joint swelling   Assessment/Plan: 1. Functional deficits secondary to Right side weakness/dysphagia secondary to left frontal lobe infarct complicated by respiratory failure from septic shock. which require 3+ hours per day of interdisciplinary therapy in a comprehensive inpatient rehab setting. Physiatrist is providing close team supervision and 24 hour management of active medical problems listed below. Physiatrist and rehab team continue to assess barriers to discharge/monitor patient progress toward functional and medical goals. FIM: Function - Bathing Position: Wheelchair/chair at sink Body parts bathed by patient: Right arm, Front perineal area, Buttocks, Left lower leg Body parts bathed by helper: Back, Right lower leg, Left upper leg, Right upper leg, Abdomen, Chest, Left arm Assist Level: Touching or steadying assistance(Pt > 75%)  Function- Upper Body Dressing/Undressing What is the patient wearing?: Pull over shirt/dress Pull over shirt/dress - Perfomed by patient: Thread/unthread right sleeve, Put head through opening, Pull shirt over trunk Pull over shirt/dress - Perfomed by helper: Thread/unthread left sleeve Assist Level: Supervision or verbal cues Function - Lower Body Dressing/Undressing What is the patient wearing?: Underwear, [employer], Pants, Socks Position: Education officer, museum at [employer] - Performed by patient: Thread/unthread right underwear leg, Thread/unthread left underwear leg, Pull underwear up/down Underwear - Performed by helper: Thread/unthread right  underwear leg, Thread/unthread left underwear leg, Pull underwear up/down Pants- Performed by patient: Thread/unthread right pants leg, Thread/unthread left pants leg, Pull pants up/down Pants- Performed by helper: Thread/unthread right pants leg, Thread/unthread left pants leg, Pull pants up/down, Fasten/unfasten pants Socks - Performed by patient: Don/doff left sock Socks - Performed by helper: Don/doff right sock Shoes - Performed by patient: Don/doff right shoe, Don/doff left shoe, Fasten right, Fasten left Shoes - Performed by helper: Fasten right, Fasten left TED Hose - Performed by helper: Don/doff right TED hose, Don/doff left TED hose Assist for footwear: Supervision/touching assist Assist for lower body dressing: Touching or steadying assistance (Pt > 75%)  Function - Toileting Toileting steps completed by patient: Adjust clothing after toileting, Adjust clothing prior to toileting, Performs perineal hygiene Toileting steps completed by helper: Adjust clothing prior to toileting, Performs perineal hygiene Toileting Assistive Devices: Grab bar or  rail Assist level: Touching or steadying assistance (Pt.75%)  Function - Air cabin crew transfer assistive device: Grab bar Assist level to toilet: Touching or steadying assistance (Pt > 75%) Assist level from toilet: Touching or steadying assistance (Pt > 75%) Assist level to bedside commode (at bedside): Touching or steadying assistance (Pt > 75%) Assist level from bedside commode (at bedside): Touching or steadying assistance (Pt > 75%)  Function - Chair/bed transfer Chair/bed transfer method: Stand pivot Chair/bed transfer assist level: Touching or steadying assistance (Pt > 75%) Chair/bed transfer assistive device: Walker Chair/bed transfer details: Verbal cues for sequencing, Verbal cues for technique, Verbal cues for precautions/safety, Manual facilitation for weight shifting, Manual facilitation for placement  Function  - Locomotion: Wheelchair Will patient use wheelchair at discharge?: No Type: Manual Max wheelchair distance: 150 Assist Level: Supervision or verbal cues Assist Level: Supervision or verbal cues Wheel 150 feet activity did not occur: Safety/medical concerns Assist Level: Supervision or verbal cues Turns around,maneuvers to table,bed, and toilet,negotiates 3% grade,maneuvers on rugs and over doorsills: No Function - Locomotion: Ambulation Assistive device: No device Max distance: 367f Assist level: Supervision or verbal cues Assist level: Supervision or verbal cues Walk 50 feet with 2 turns activity did not occur: Safety/medical concerns Assist level: Supervision or verbal cues Walk 150 feet activity did not occur: Safety/medical concerns Assist level: Supervision or verbal cues Walk 10 feet on uneven surfaces activity did not occur: Safety/medical concerns Assist level: Touching or steadying assistance (Pt > 75%)  Function - Comprehension Comprehension: Auditory Comprehension assist level: Understands complex 90% of the time/cues 10% of the time  Function - Expression Expression: Verbal Expression assist level: Expresses complex 90% of the time/cues < 10% of the time  Function - Social Interaction Social Interaction assist level: Interacts appropriately 75 - 89% of the time - Needs redirection for appropriate language or to initiate interaction.  Function - Problem Solving Problem solving assist level: Solves basic problems with no assist  Function - Memory Memory assist level: Recognizes or recalls 90% of the time/requires cueing < 10% of the time Patient normally able to recall (first 3 days only): Current season, Location of own room, Staff names and faces, That he or she is in a hospital  Medical Problem List and Plan: 1.  Right side weakness/dysphagia secondary to left frontal lobe infarct complicated by respiratory failure,   2.  DVT Prophylaxis/Anticoagulation: SCDs.  Monitor for any signs of DVT 3. Pain Management: Tylenol as needed 4. Dysphagia/esophageal stricture. Status post gastrostomy tube 06/14/2015 per interventional radiology. Speech therapy follow-up. Plan outpatient EGD endoscopy for dilatation of stricture, swallowing dysfunction improving when comparing the modified barium swallow performed 06/09/2015 versus 06/15/2015,-taking ~60% meals, 5. Neuropsych: This patient is capable of making decisions on her own behalf. 6. Skin/Wound Care: Routine skin checks, gastrostomy tube site looks okay except has some dried blood. Will write for daily normal saline rinses 7. Fluids/Electrolytes/Nutrition: Routine I&O's with follow-up chemistries 8. ID. Strep pyogenes bacteremia. Followed by infectious disease. Continue ceftriaxone questions 6 week course and confirm with Dr. VDrucilla Schmidt Maintain contact precautions 9. Atrial flutter with RVR. Lopressor 6.25 mg twice a day,88/55,78, lower SBP may be hydration related check BMET 10. Rheumatoid arthritis/Sjorgens disease. Discuss resuming plaquenil and chronic prednisone.no active joint inflammation at the current time 11. Hyperglycemia secondary to chronic prednisone/tube feeds. Hemoglobin A1c 6.6.141 this am12. Hyperlipidemia. Lipitor, patient has elevated liver function tests, we'll reduce Lipitor from 40 mg to 20 mg and recheck C met  in 1-2  weeks 13. Hypothyroidism. Resume Synthroid 14. Elevated LFTs, had recent gallbladder ultrasound which was negative. Cholelithiasis but no clinical evidence of cholecystitis 15.  Hematuria- check UA, Hgb looks in range vs prior , this is intermittent, may need uro f/u as oupt, has yeast in urine, will order diflucan CBC Latest Ref Rng 06/24/2015 06/17/2015 06/16/2015  WBC 4.0 - 10.5 K/uL 9.9 5.9 5.8  Hemoglobin 12.0 - 15.0 g/dL 9.3(L) 10.2(L) 9.4(L)  Hematocrit 36.0 - 46.0 % 29.6(L) 34.4(L) 30.8(L)  Platelets 150 - 400 K/uL 633(H) 723(H) 707(H)    LOS (Days) 9 A FACE TO FACE  EVALUATION WAS PERFORMED  Brantly Kalman E 06/25/2015, 9:04 AM

## 2015-06-26 ENCOUNTER — Inpatient Hospital Stay (HOSPITAL_COMMUNITY): Payer: BC Managed Care – PPO | Admitting: Occupational Therapy

## 2015-06-26 ENCOUNTER — Inpatient Hospital Stay (HOSPITAL_COMMUNITY): Payer: BC Managed Care – PPO | Admitting: Speech Pathology

## 2015-06-26 DIAGNOSIS — N390 Urinary tract infection, site not specified: Secondary | ICD-10-CM | POA: Insufficient documentation

## 2015-06-26 NOTE — Progress Notes (Signed)
Occupational Therapy Session Note  Patient Details  Name: Raena Pau MRN: [id number] Date of Birth: [birth year]  Today's Date: 06/26/2015 OT Individual Time: 2820-6015 OT Individual Time Calculation (min): 30 min   Short Term Goals: Week 1:  OT Short Term Goal 1 (Week 1): Pt will perform all bathing sit to stand with min assist 3 consecutive sessions. OT Short Term Goal 1 - Progress (Week 1): Met OT Short Term Goal 2 (Week 1): Pt will donn pullover shirt with supervison following hemi techniques OT Short Term Goal 2 - Progress (Week 1): Not met OT Short Term Goal 3 (Week 1): Pt will complete LB dressing sit to stand with min assist 3 consecutive sessions. OT Short Term Goal 3 - Progress (Week 1): Not met OT Short Term Goal 4 (Week 1): Pt will complete toilet transfers with min guard assist using LRAD. OT Short Term Goal 4 - Progress (Week 1): Met OT Short Term Goal 5 (Week 1): Pt will use the RUE for bathing with min assist to wash the LUE.   OT Short Term Goal 5 - Progress (Week 1): Not met   Week 2:  OT Short Term Goal 1 (Week 2): Pt will continue working toward established LTGs set at supervision level overall.  Skilled Therapeutic Interventions/Progress Updates:  Pt found seated in w/c alert, awake, and ready to work with therapist. Pt with no complaints of pain. Pt ambulated into BR using SPC with min guard assist. Pt able to perform toileting tasks with min guard for safety. From here, pt ambulated to sink for grooming tasks of brushing teeth and washing hands in standing. Pt reports "this is the first time I've stood to brush my teeth". Pt then ambulated to bed side chair where clothes were located and gathered clothing. Pt performed UB/LB dressing in sit to/from stand position from w/c. Pt with some confusion on sequencing during dressing, encouraged pt to have a routine at home to increase independence with sequencing. Pt commented that she sometimes gets anxious because she wants to  make sure she's doing things "right". Pt able to dress herself, just during sit to/from stands, therapist provided close supervision to min guard for safety. Pt reports this is her second day using SPC. At end of session, left pt seated in w/c with all needs within reach.   Pt with complaints of increased dry skin, encouraged her to talk with her doctor about this. Pt also with questions regarding showering, encouraged her to talk with her doctor about this as well.   Therapy Documentation Precautions:  Precautions Precautions: Fall Precaution Comments: R hemiplegia UE more impaired than LE Required Braces or Orthoses: Sling Restrictions Weight Bearing Restrictions: No  Vital Signs: Therapy Vitals Temp: 98.7 F (37.1 C) Temp Source: Oral Pulse Rate: 77 Resp: 18 BP: (!) 92/51 mmHg Patient Position (if appropriate): Lying Oxygen Therapy SpO2: 100 % O2 Device: Not Delivered  See Function Navigator for Current Functional Status.   Therapy/Group: Individual Therapy  Chrys Racer , MS, OTR/L, CLT  06/26/2015, 9:51 AM

## 2015-06-26 NOTE — Progress Notes (Signed)
Speech Language Pathology Daily Session Note  Patient Details  Name: [name] [name] MRN: [id number] Date of Birth: [birth year]  Today's Date: 06/26/2015 SLP Individual Time: [id number] SLP Individual Time Calculation (min): 30 min  Short Term Goals: Week 2: SLP Short Term Goal 1 (Week 2): Pt will utilize compensatory memory strategies to facilitate recall of complex information wtih supervision verbal cues.   SLP Short Term Goal 2 (Week 2): Pt will complete semi-complex functional home management tasks for >80% accuracy at mod I for increased time/use of compensatory strategies. SLP Short Term Goal 3 (Week 2): Pt will tolerate trials of dysphagia 3 textures without s/s aspiration or anxiety. SLP Short Term Goal 4 (Week 2): Pt will complete pharyngeal strengthening exercises at mod I.  Skilled Therapeutic Interventions: Upon SLP arrival patient reported that she had recently finished eating; as a result, skilled treatment session focused on addressing cognition goals. SLP facilitated session with a mildly complex new learning problem solving task, repetition of instructions and an external memory aid that was faded during session.  Patient Mod I for recall of by end of session and required Min verbal cues to carryout accurately. Of note, patient able to recall and returned demo x3 of Masako maneuver from yesterday's session.  Patient reports that she has been completing exercise on her own but fatigues after 3.  SLP encouraged her to continue and try to build up to completion of 5. Continue with current plan of care.   Function:   Cognition Comprehension Comprehension assist level: Understands complex 90% of the time/cues 10% of the time  Expression   Expression assist level: Expresses complex 90% of the time/cues < 10% of the time  Social Interaction Social Interaction assist level: Interacts appropriately 90% of the time - Needs monitoring or encouragement for participation or interaction.   Problem Solving Problem solving assist level: Solves basic problems with no assist  Memory Memory assist level: Recognizes or recalls 90% of the time/requires cueing < 10% of the time    Pain Pain Assessment Pain Assessment: No/denies pain  Therapy/Group: Individual Therapy  Carmelia Roller., [place] [id number]  [place] 06/26/2015, 5:08 PM

## 2015-06-26 NOTE — Progress Notes (Signed)
Subjective/Complaints: Patient seen this morning sitting up in bed. She appears to be in good spirits. She notes that she has not had any hematuria today.  Review of systems negative for chest pain, shortness of breath, nausea vomiting diarrhea constipation  Objective: Vital Signs: Blood pressure 92/51, pulse 77, temperature 98.7 F (37.1 C), temperature source Oral, resp. rate 18, height 5\' 1"  (1.549 m), weight 56.473 kg (124 lb 8 oz), SpO2 100 %. No results found. Results for orders placed or performed during the hospital encounter of 06/16/15 (from the past 72 hour(s))  Glucose, capillary     Status: Abnormal   Collection Time: 06/23/15  8:20 AM  Result Value Ref Range   Glucose-Capillary 146 (H) 65 - 99 mg/dL  Glucose, capillary     Status: Abnormal   Collection Time: 06/23/15 12:07 PM  Result Value Ref Range   Glucose-Capillary 141 (H) 65 - 99 mg/dL  CBC     Status: Abnormal   Collection Time: 06/24/15  4:45 AM  Result Value Ref Range   WBC 9.9 4.0 - 10.5 K/uL   RBC 3.08 (L) 3.87 - 5.11 MIL/uL   Hemoglobin 9.3 (L) 12.0 - 15.0 g/dL   HCT 08/24/15 (L) 57.2 - 62.0 %   MCV 96.1 78.0 - 100.0 fL   MCH 30.2 26.0 - 34.0 pg   MCHC 31.4 30.0 - 36.0 g/dL   RDW 35.5 (H) 97.4 - 16.3 %   Platelets 633 (H) 150 - 400 K/uL  Urine culture     Status: None (Preliminary result)   Collection Time: 06/24/15 10:15 AM  Result Value Ref Range   Specimen Description URINE, CLEAN CATCH    Special Requests NONE    Culture >=100,000 COLONIES/mL GRAM NEGATIVE RODS    Report Status PENDING   Urinalysis, Routine w reflex microscopic (not at [facility])     Status: Abnormal   Collection Time: 06/24/15 10:15 AM  Result Value Ref Range   Color, Urine YELLOW YELLOW   APPearance TURBID (A) CLEAR   Specific Gravity, Urine 1.014 1.005 - 1.030   pH 8.0 5.0 - 8.0   Glucose, UA NEGATIVE NEGATIVE mg/dL   Hgb urine dipstick LARGE (A) NEGATIVE   Bilirubin Urine NEGATIVE NEGATIVE   Ketones, ur NEGATIVE NEGATIVE mg/dL    Protein, ur NEGATIVE NEGATIVE mg/dL   Nitrite NEGATIVE NEGATIVE   Leukocytes, UA TRACE (A) NEGATIVE  Urine microscopic-add on     Status: Abnormal   Collection Time: 06/24/15 10:15 AM  Result Value Ref Range   Squamous Epithelial / LPF 0-5 (A) NONE SEEN   WBC, UA 0-5 0 - 5 WBC/hpf   RBC / HPF TOO NUMEROUS TO COUNT 0 - 5 RBC/hpf   Bacteria, UA FEW (A) NONE SEEN   Urine-Other YEAST PRESENT     Comment: AMORPHOUS URATES/PHOSPHATES  Glucose, capillary     Status: None   Collection Time: 06/24/15  8:13 PM  Result Value Ref Range   Glucose-Capillary 99 65 - 99 mg/dL  Basic metabolic panel     Status: Abnormal   Collection Time: 06/25/15  1:40 PM  Result Value Ref Range   Sodium 135 135 - 145 mmol/L   Potassium 4.0 3.5 - 5.1 mmol/L   Chloride 101 101 - 111 mmol/L   CO2 24 22 - 32 mmol/L   Glucose, Bld 178 (H) 65 - 99 mg/dL   BUN 14 6 - 20 mg/dL   Creatinine, Ser 08/25/15 0.44 - 1.00 mg/dL   Calcium 9.2  8.9 - 10.3 mg/dL   GFR calc non Af Amer >60 >60 mL/min   GFR calc Af Amer >60 >60 mL/min    Comment: (NOTE) The eGFR has been calculated using the CKD EPI equation. This calculation has not been validated in all clinical situations. eGFR's persistently <60 mL/min signify possible Chronic Kidney Disease.    Anion gap 10 5 - 15      General: No acute distress. Vital signs reviewed. Psych: Mood and affect are appropriate Heart: Regular rate and rhythm no rubs murmurs or extra sounds Lungs: Clear to auscultation, breathing unlabored, no rales or wheezes, decreased breath sounds at bases Abdomen: Positive bowel sounds, soft nontender to palpation, nondistended Skin: No evidence of breakdown, no evidence of rash Neurologic: Alert and oriented  Motor strength is 5/5 in left deltoid, bicep, tricep, grip, hip flexor, knee extensors, ankle dorsiflexor and plantar flexor 4-/5 minus right deltoid, 3+/5 bicep, tricep, 2+/5 grip 4 right hip flexor and knee extensor and 4-/5 ankle  dorsiflexor Musculoskeletal: No edema. No tenderness.  Assessment/Plan: 1. Functional deficits secondary to Right side weakness/dysphagia secondary to left frontal lobe infarct complicated by respiratory failure from septic shock. which require 3+ hours per day of interdisciplinary therapy in a comprehensive inpatient rehab setting. Physiatrist is providing close team supervision and 24 hour management of active medical problems listed below. Physiatrist and rehab team continue to assess barriers to discharge/monitor patient progress toward functional and medical goals. FIM: Function - Bathing Position: Standing at sink Body parts bathed by patient: Buttocks, Front perineal area (Pt washed under arms on both sides with  mod assist for washing under the left) Body parts bathed by helper: Back, Right lower leg, Left upper leg, Right upper leg, Abdomen, Chest, Left arm Bathing not applicable: Right arm, Left arm, Chest, Abdomen, Right upper leg, Left upper leg, Right lower leg, Left lower leg, Back Assist Level: Touching or steadying assistance(Pt > 75%)  Function- Upper Body Dressing/Undressing What is the patient wearing?: Pull over shirt/dress Pull over shirt/dress - Perfomed by patient: Thread/unthread right sleeve, Put head through opening, Pull shirt over trunk Pull over shirt/dress - Perfomed by helper: Thread/unthread left sleeve Assist Level: Supervision or verbal cues Function - Lower Body Dressing/Undressing What is the patient wearing?: Pants, Underwear, Shoes Position: Wheelchair/chair at sink Underwear - Performed by patient: Thread/unthread right underwear leg, Thread/unthread left underwear leg, Pull underwear up/down Underwear - Performed by helper: Thread/unthread right underwear leg, Thread/unthread left underwear leg, Pull underwear up/down Pants- Performed by patient: Thread/unthread right pants leg, Thread/unthread left pants leg, Pull pants up/down Pants- Performed by  helper: Thread/unthread right pants leg, Thread/unthread left pants leg, Pull pants up/down, Fasten/unfasten pants Socks - Performed by patient: Don/doff left sock Socks - Performed by helper: Don/doff right sock Shoes - Performed by patient: Don/doff right shoe, Don/doff left shoe Shoes - Performed by helper: Fasten right, Fasten left TED Hose - Performed by helper: Don/doff right TED hose, Don/doff left TED hose Assist for footwear: Supervision/touching assist Assist for lower body dressing: Touching or steadying assistance (Pt > 75%)  Function - Toileting Toileting steps completed by patient: Adjust clothing after toileting, Adjust clothing prior to toileting, Performs perineal hygiene Toileting steps completed by helper: Adjust clothing prior to toileting, Performs perineal hygiene Toileting Assistive Devices: Grab bar or rail Assist level: Touching or steadying assistance (Pt.75%)  Function - Air cabin crew transfer assistive device: Grab bar Assist level to toilet: Touching or steadying assistance (Pt > 75%) Assist level from  toilet: Touching or steadying assistance (Pt > 75%) Assist level to bedside commode (at bedside): Touching or steadying assistance (Pt > 75%) Assist level from bedside commode (at bedside): Touching or steadying assistance (Pt > 75%)  Function - Chair/bed transfer Chair/bed transfer method: Ambulatory Chair/bed transfer assist level: Supervision or verbal cues Chair/bed transfer assistive device: Armrests, Cane Chair/bed transfer details: Verbal cues for sequencing, Verbal cues for technique, Verbal cues for precautions/safety, Manual facilitation for weight shifting, Manual facilitation for placement  Function - Locomotion: Wheelchair Will patient use wheelchair at discharge?: No Type: Manual Max wheelchair distance: 150 Assist Level: Supervision or verbal cues Assist Level: Supervision or verbal cues Wheel 150 feet activity did not occur:  Safety/medical concerns Assist Level: Supervision or verbal cues Turns around,maneuvers to table,bed, and toilet,negotiates 3% grade,maneuvers on rugs and over doorsills: No Function - Locomotion: Ambulation Assistive device: Cane-straight Max distance: 200 Assist level: Supervision or verbal cues Assist level: Supervision or verbal cues Walk 50 feet with 2 turns activity did not occur: Safety/medical concerns Assist level: Supervision or verbal cues Walk 150 feet activity did not occur: Safety/medical concerns Assist level: Supervision or verbal cues Walk 10 feet on uneven surfaces activity did not occur: Safety/medical concerns Assist level: Touching or steadying assistance (Pt > 75%)  Function - Comprehension Comprehension: Auditory Comprehension assist level: Understands complex 90% of the time/cues 10% of the time  Function - Expression Expression: Verbal Expression assist level: Expresses complex 90% of the time/cues < 10% of the time  Function - Social Interaction Social Interaction assist level: Interacts appropriately 90% of the time - Needs monitoring or encouragement for participation or interaction.  Function - Problem Solving Problem solving assist level: Solves basic problems with no assist  Function - Memory Memory assist level: Recognizes or recalls 90% of the time/requires cueing < 10% of the time Patient normally able to recall (first 3 days only): Current season, Location of own room, Staff names and faces, That he or she is in a hospital  Medical Problem List and Plan: 1.  Right side weakness/dysphagia secondary to left frontal lobe infarct complicated by respiratory failure  Continue CIR 2.  DVT Prophylaxis/Anticoagulation: SCDs. Monitor for any signs of DVT 3. Pain Management: Tylenol as needed 4. Dysphagia/esophageal stricture. Status post gastrostomy tube 06/14/2015 per interventional radiology. Speech therapy follow-up. Plan outpatient EGD endoscopy for  dilatation of stricture, swallowing dysfunction improving when comparing the modified barium swallow performed 06/09/2015 versus 06/15/2015,-taking 25 meals yesterday 5. Neuropsych: This patient is capable of making decisions on her own behalf. 6. Skin/Wound Care: Routine skin checks, gastrostomy tube site looks okay. Continue daily normal saline rinses 7. Fluids/Electrolytes/Nutrition: Routine I&O's 8. ID. Strep pyogenes bacteremia. Followed by infectious disease. Continue ceftriaxone questions 6 week course and confirm with Dr. Drucilla Schmidt.  9. Atrial flutter with RVR. Continue Lopressor 6.25 mg twice a day  10. Rheumatoid arthritis/Sjorgens disease. Discuss resuming plaquenil and chronic prednisone.no active joint inflammation at the current time 11. Hyperglycemia secondary to chronic prednisone/tube feeds. Hemoglobin A1c 6.6. 99  this am 12. Hyperlipidemia. Lipitor, patient has elevated liver function tests, reduced Lipitor from 40 mg to 20 mg and recheck C met  in 1-2 weeks 13. Hypothyroidism. Resume Synthroid 14. Elevated LFTs, had recent gallbladder ultrasound which was negative. Cholelithiasis but no clinical evidence of cholecystitis 15.  Hematuria- Hgb looks in range vs prior , this is intermittent, may need uro f/u as oupt,   has yeast in urine, diflucan ordered   urine culture suggesting  greater than 100,000 gram-negative rods, will continue to follow for sensitivities  LOS (Days) 10 A FACE TO FACE EVALUATION WAS PERFORMED  Ankit Lorie Phenix 06/26/2015, 8:04 AM

## 2015-06-27 ENCOUNTER — Inpatient Hospital Stay (HOSPITAL_COMMUNITY): Payer: BC Managed Care – PPO | Admitting: Occupational Therapy

## 2015-06-27 LAB — URINE CULTURE

## 2015-06-27 MED ORDER — NITROFURANTOIN MONOHYD MACRO 100 MG PO CAPS
100.0000 mg | ORAL_CAPSULE | Freq: Two times a day (BID) | ORAL | Status: DC
  Administered 2015-06-27 – 2015-06-30 (×7): 100 mg via ORAL
  Filled 2015-06-27 (×8): qty 1

## 2015-06-27 NOTE — Progress Notes (Signed)
Lab called with urine culture results of E. Coli/CRE. Spoke to Infection Prevention RN. Contact precautions initiated and patient/visitors educated about hygiene. MD notified and antibiotic ordered and given. Will report to oncoming RN.

## 2015-06-27 NOTE — Consult Note (Signed)
NEUROBEHAVIORAL STATUS EXAM - [facility]   MEDICAL NECESSITY:  [name] [name] was seen on [name] for a neurobehavioral status exam owing to [name] [name]'s diagnosis of cerebral infarction, and to assist in treatment planning during admission.   Records also indicate that [name] [name] is a "60 y.o.right handed  female with history of Sjogrens disease/rheumatoid arthritis maintained on plaquenil and prednisone, noted history of seizure but not on antiepileptic meds and dysphagia. [name] diagnosed 4 days prior with Flu and placed on Tamiflu. Lives with spouse independent prior to admission. 2 level home with bedroom on main level. 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 Presented 05/29/2015 with right side weakness and gait instability. [name] with severe restrictive airway distress when he presented to [name] ED with oxygen saturations in [name] 70s requiring non-rebreathing mask and later intubated with slow extubation follow-up per critical care pulmonary services. Chest x-ray showed large right lung consolidative changes. CT angiogram chest no evidence of pulmonary emboli. Cranial CT scan negative. MRI of [name] brain showed acute small left frontal lobe infarct. Echocardiogram with ejection fraction of 65% no wall motion abnormalities. 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 EEG was negative for seizure. [name] did not receive TPA. Blood cultures showed group A strep pansensitive 05/30/2015 and currently maintained on Rocephin with droplet contact precautions with question duration of antibiotics.  Hospital course atrial flutter currently maintained Lovenox 40 mg daily and asymptomatic with low dose beta blocker added. Modified barium swallow 06/09/2015 shows severe pharyngeal dysphagia as well as esophageal stricture. [name] underwent gastrostomy tube per interventional  radiology 06/14/2015. Currently maintained on Lovenox for DVT prophylaxis. Plan is to begin aspirin/ Plavix therapy for CVA prophylaxis after PEG tube feeds initiated. Plan for EGD with endoscopy in 6-8 weeks as outpatient for dilatation of stricture. Physical therapy evaluation completed 06/09/2015 with recommendations of physical medicine rehabilitation consult. [name] was admitted for a comprehensive rehabilitation program."   During today's visit, [name] [name] reported experiencing post-stroke mild memory loss, as well as right arm and leg weakness. She reportedly suffered a series of unfortunate events that led to [name] stroke including having [name] flu and then pneumonia. She has been feeling much better and she feels that these difficulties have been improving.   From an emotional standpoint, [name] [name] said that she is "not depressed." She has been in generally good spirits despite her present medical situation. She was feeling a little anxious when working with speech therapy but that is only because she wants to do well. There is no history of mental health issues or need for treatment. [name] is discharging next Wednesday and is excited about going home. Her husband, a working Company secretary, will be able to assist with [name] transition as well as multiple church members. No adjustment issues endorsed. Suicidal/homicidal ideation, plan or intent was denied. No manic or hypomanic episodes were reported. [name] [name] denied ever experiencing any auditory/visual hallucinations. No major behavioral or personality changes were endorsed.   With regard to therapy, [name] [name] feels that she is making progress. She described [name] rehab staff as "wonderful." No barriers to therapy identified.   PROCEDURES: [2 units [id number] Diagnostic clinical interview  Review of available records  MENTAL STATUS EXAM: APPEARANCE:  Slight facial droop GEN:  Alert and oriented MOOD:  Euthymic       AFFECT:  Appropriate SPEECH:   Clear  THOUGHT CONTENT:  Appropriate HALLUCINATIONS:  None INTELLIGENCE:  Average  INSIGHT:  Good JUDGMENT:  Good SUICIDAL IDEATION:  Denies SI   HOMICIDAL IDEATION:   Denies HI   IMPRESSION: Overall, [name] [name] appears to be adjusting well to her present situation and acknowledged suffering from at least mild memory issues. I encouraged her to consider undergoing neuropsychological testing at some point post-discharge to better ascertain cognitive strengths and challenges as well as to monitor symptoms over time. I will ask that social work look for this type of resource in her area. I do not feel that neuropsychology needs to follow-up with [name] [name] for [name] remainder of this admission unless requested by [name] [name] or staff.   PROVISIONAL DIAGNOSIS: Mild Vascular Cognitive Impairment     Rutha Bouchard, Psy.D.  Clinical Neuropsychologist

## 2015-06-27 NOTE — Progress Notes (Signed)
Occupational Therapy Session Note  Patient Details  Name: [name] [name] MRN: [id number] Date of Birth: 09-24-1955  Today's Date: 06/27/2015 OT Individual Time:  -   1100-1130  (30 min)      Short Term Goals: Week 1:  OT Short Term Goal 1 (Week 1): Pt will perform all bathing sit to stand with min assist 3 consecutive sessions. OT Short Term Goal 1 - Progress (Week 1): Met OT Short Term Goal 2 (Week 1): Pt will donn pullover shirt with supervison following hemi techniques OT Short Term Goal 2 - Progress (Week 1): Not met OT Short Term Goal 3 (Week 1): Pt will complete LB dressing sit to stand with min assist 3 consecutive sessions. OT Short Term Goal 3 - Progress (Week 1): Not met OT Short Term Goal 4 (Week 1): Pt will complete toilet transfers with min guard assist using LRAD. OT Short Term Goal 4 - Progress (Week 1): Met OT Short Term Goal 5 (Week 1): Pt will use the RUE for bathing with min assist to wash the LUE.   OT Short Term Goal 5 - Progress (Week 1): Not met Week 2:  OT Short Term Goal 1 (Week 2): Pt will continue working toward established LTGs set at supervision level overall.      Skilled Therapeutic Interventions/Progress Updates:    Engaged in transfers, sit to stand and RUE NMRE.  Pt transfers fro wc to toilet with min assist; Continent of bowel and bladder.  OT instructs and educates pt on RUE NMRE during dressing tasks with WB, and gross grasp.  Pt shows decreased gross grasp in RUE and needs max assist to maintain.  Pt good awareness of RUE and incorporates  While engaging in functional tasks with minimal verbal instruction.    Therapy Documentation Precautions:  Precautions Precautions: Fall Precaution Comments: R hemiplegia UE more impaired than LE Required Braces or Orthoses: Sling Restrictions Weight Bearing Restrictions: No    Vital Signs: Therapy Vitals Temp: 97.9 F (36.6 C) Temp Source: Oral Pulse Rate: 91 Resp: 18 BP: (!) 104/57 mmHg Patient  Position (if appropriate): Sitting Oxygen Therapy SpO2: 100 % O2 Device: Not Delivered Pain:  none          See Function Navigator for Current Functional Status.   Therapy/Group: Individual Therapy  Lisa Roca 06/27/2015, 4:40 PM

## 2015-06-27 NOTE — Progress Notes (Signed)
Subjective/Complaints: Patient sitting up in bed this AM.  She has questions about her lab work.  She is also looking forward to seeing some snow today.   Review of systems negative for chest pain, shortness of breath, nausea vomiting diarrhea constipation  Objective: Vital Signs: Blood pressure 99/64, pulse 94, temperature 98.3 F (36.8 C), temperature source Oral, resp. rate 18, height 5' 1"  (1.549 m), weight 53.7 kg (118 lb 6.2 oz), SpO2 100 %. No results found. Results for orders placed or performed during the hospital encounter of 06/16/15 (from the past 72 hour(s))  Glucose, capillary     Status: None   Collection Time: 06/24/15  8:13 PM  Result Value Ref Range   Glucose-Capillary 99 65 - 99 mg/dL  Basic metabolic panel     Status: Abnormal   Collection Time: 06/25/15  1:40 PM  Result Value Ref Range   Sodium 135 135 - 145 mmol/L   Potassium 4.0 3.5 - 5.1 mmol/L   Chloride 101 101 - 111 mmol/L   CO2 24 22 - 32 mmol/L   Glucose, Bld 178 (H) 65 - 99 mg/dL   BUN 14 6 - 20 mg/dL   Creatinine, Ser 0.73 0.44 - 1.00 mg/dL   Calcium 9.2 8.9 - 10.3 mg/dL   GFR calc non Af Amer >60 >60 mL/min   GFR calc Af Amer >60 >60 mL/min    Comment: (NOTE) The eGFR has been calculated using the CKD EPI equation. This calculation has not been validated in all clinical situations. eGFR's persistently <60 mL/min signify possible Chronic Kidney Disease.    Anion gap 10 5 - 15      General: No acute distress. Vital signs reviewed. Psych: Mood and affect are appropriate Heart: Regular rate and rhythm no rubs murmurs or extra sounds Lungs: Clear to auscultation, breathing unlabored, no rales or wheezes, decreased breath sounds at bases Abdomen: Positive bowel sounds, soft nontender to palpation, nondistended Skin: No evidence of breakdown, no evidence of rash Neurologic: Alert and oriented  Motor strength is 5/5 in left deltoid, bicep, tricep, grip, hip flexor, knee extensors, ankle dorsiflexor  and plantar flexor 4-/5 minus right deltoid, 3+/5 bicep, tricep, 3+/5 grip 4 right hip flexor and knee extensor and 4-/5 ankle dorsiflexor Musculoskeletal: No edema. No tenderness.  Assessment/Plan: 1. Functional deficits secondary to Right side weakness/dysphagia secondary to left frontal lobe infarct complicated by respiratory failure from septic shock. which require 3+ hours per day of interdisciplinary therapy in a comprehensive inpatient rehab setting. Physiatrist is providing close team supervision and 24 hour management of active medical problems listed below. Physiatrist and rehab team continue to assess barriers to discharge/monitor patient progress toward functional and medical goals. FIM: Function - Bathing Position: Standing at sink Body parts bathed by patient: Buttocks, Front perineal area (Pt washed under arms on both sides with  mod assist for washing under the left) Body parts bathed by helper: Back, Right lower leg, Left upper leg, Right upper leg, Abdomen, Chest, Left arm Bathing not applicable: Right arm, Left arm, Chest, Abdomen, Right upper leg, Left upper leg, Right lower leg, Left lower leg, Back Assist Level: Touching or steadying assistance(Pt > 75%)  Function- Upper Body Dressing/Undressing What is the patient wearing?: Pull over shirt/dress Pull over shirt/dress - Perfomed by patient: Thread/unthread right sleeve, Put head through opening, Pull shirt over trunk Pull over shirt/dress - Perfomed by helper: Thread/unthread left sleeve Assist Level: Supervision or verbal cues Function - Lower Body Dressing/Undressing What is the  patient wearing?: Pants, Underwear, Shoes Position: Wheelchair/chair at sink Underwear - Performed by patient: Thread/unthread right underwear leg, Thread/unthread left underwear leg, Pull underwear up/down Underwear - Performed by helper: Thread/unthread right underwear leg, Thread/unthread left underwear leg, Pull underwear up/down Pants-  Performed by patient: Thread/unthread right pants leg, Thread/unthread left pants leg, Pull pants up/down Pants- Performed by helper: Thread/unthread right pants leg, Thread/unthread left pants leg, Pull pants up/down, Fasten/unfasten pants Socks - Performed by patient: Don/doff left sock Socks - Performed by helper: Don/doff right sock Shoes - Performed by patient: Don/doff right shoe, Don/doff left shoe Shoes - Performed by helper: Fasten right, Fasten left TED Hose - Performed by helper: Don/doff right TED hose, Don/doff left TED hose Assist for footwear: Supervision/touching assist Assist for lower body dressing: Touching or steadying assistance (Pt > 75%)  Function - Toileting Toileting steps completed by patient: Adjust clothing prior to toileting Toileting steps completed by helper: Performs perineal hygiene, Adjust clothing after toileting Toileting Assistive Devices: Grab bar or rail Assist level: Touching or steadying assistance (Pt.75%)  Function - Air cabin crew transfer assistive device: Grab bar Assist level to toilet: Touching or steadying assistance (Pt > 75%) Assist level from toilet: Touching or steadying assistance (Pt > 75%) Assist level to bedside commode (at bedside): Touching or steadying assistance (Pt > 75%) Assist level from bedside commode (at bedside): Touching or steadying assistance (Pt > 75%)  Function - Chair/bed transfer Chair/bed transfer method: Ambulatory Chair/bed transfer assist level: Supervision or verbal cues Chair/bed transfer assistive device: Armrests, Cane Chair/bed transfer details: Verbal cues for sequencing, Verbal cues for technique, Verbal cues for precautions/safety, Manual facilitation for weight shifting, Manual facilitation for placement  Function - Locomotion: Wheelchair Will patient use wheelchair at discharge?: No Type: Manual Max wheelchair distance: 150 Assist Level: Supervision or verbal cues Assist Level:  Supervision or verbal cues Wheel 150 feet activity did not occur: Safety/medical concerns Assist Level: Supervision or verbal cues Turns around,maneuvers to table,bed, and toilet,negotiates 3% grade,maneuvers on rugs and over doorsills: No Function - Locomotion: Ambulation Assistive device: Cane-straight Max distance: 200 Assist level: Supervision or verbal cues Assist level: Supervision or verbal cues Walk 50 feet with 2 turns activity did not occur: Safety/medical concerns Assist level: Supervision or verbal cues Walk 150 feet activity did not occur: Safety/medical concerns Assist level: Supervision or verbal cues Walk 10 feet on uneven surfaces activity did not occur: Safety/medical concerns Assist level: Touching or steadying assistance (Pt > 75%)  Function - Comprehension Comprehension: Auditory Comprehension assist level: Understands complex 90% of the time/cues 10% of the time  Function - Expression Expression: Verbal Expression assist level: Expresses complex 90% of the time/cues < 10% of the time  Function - Social Interaction Social Interaction assist level: Interacts appropriately 90% of the time - Needs monitoring or encouragement for participation or interaction.  Function - Problem Solving Problem solving assist level: Solves basic problems with no assist  Function - Memory Memory assist level: Recognizes or recalls 90% of the time/requires cueing < 10% of the time Patient normally able to recall (first 3 days only): Current season, Location of own room, Staff names and faces, That he or she is in a hospital  Medical Problem List and Plan: 1.  Right side weakness/dysphagia secondary to left frontal lobe infarct complicated by respiratory failure  Continue CIR 2.  DVT Prophylaxis/Anticoagulation: SCDs. Monitor for any signs of DVT 3. Pain Management: Tylenol as needed 4. Dysphagia/esophageal stricture. Status post gastrostomy tube 06/14/2015 per  interventional  radiology. Speech therapy follow-up. Plan outpatient EGD endoscopy for dilatation of stricture, swallowing dysfunction improving when comparing the modified barium swallow performed 06/09/2015 versus 06/15/2015,-Ate 75% of dinner yesterday.  5. Neuropsych: This patient is capable of making decisions on her own behalf. 6. Skin/Wound Care: Routine skin checks, gastrostomy tube site looks okay. Continue daily normal saline rinses 7. Fluids/Electrolytes/Nutrition: Routine I&O's 8. ID. Strep pyogenes bacteremia. Followed by infectious disease. Continue ceftriaxone questions 6 week course and confirm with Dr. Drucilla Schmidt.  9. Atrial flutter with RVR. Continue Lopressor 6.25 mg twice a day  10. Rheumatoid arthritis/Sjorgens disease. Discuss resuming plaquenil and chronic prednisone.no active joint inflammation at the current time 11. Hyperglycemia secondary to chronic prednisone/tube feeds. Hemoglobin A1c 6.6.  12. Hyperlipidemia. Lipitor, patient has elevated liver function tests, reduced Lipitor from 40 mg to 20 mg and recheck C met  in 1-2 weeks 13. Hypothyroidism. Resume Synthroid 14. Elevated LFTs, had recent gallbladder ultrasound which was negative. Cholelithiasis but no clinical evidence of cholecystitis 15.  Hematuria- Hgb looks in range vs prior , this is intermittent, may need uro f/u as oupt,   has yeast in urine, diflucan ordered   urine culture suggesting greater than 100,000 E. Coli.  Macrobid started on 3/12  LOS (Days) 11 A FACE TO FACE EVALUATION WAS PERFORMED  Blaize Epple Lorie Phenix 06/27/2015, 1:28 PM

## 2015-06-28 ENCOUNTER — Inpatient Hospital Stay (HOSPITAL_COMMUNITY): Payer: BC Managed Care – PPO | Admitting: Physical Therapy

## 2015-06-28 ENCOUNTER — Inpatient Hospital Stay (HOSPITAL_COMMUNITY): Payer: BC Managed Care – PPO | Admitting: Speech Pathology

## 2015-06-28 ENCOUNTER — Inpatient Hospital Stay (HOSPITAL_COMMUNITY): Payer: BC Managed Care – PPO | Admitting: Occupational Therapy

## 2015-06-28 NOTE — Progress Notes (Signed)
Occupational Therapy Session Note  Patient Details  Name: [name] [name] MRN: [id number] Date of Birth: 1956/01/27  Today's Date: 06/28/2015 OT Individual Time: [id number] OT Individual Time Calculation (min): 63 min    Short Term Goals: Week 2:  OT Short Term Goal 1 (Week 2): Pt will continue working toward established LTGs set at supervision level overall.  Skilled Therapeutic Interventions/Progress Updates:    Pt completed toileting and toilet transfer to start session.  Min instructional cueing for use of cane with transitions sit to stand and stand to sit.  She was able to complete transfer with close supervision as well as all aspects, before transferring to the bench in the walk-in shower.  Utilized wash mit on the RUE for most of bathing with min assist.  She only needed assist with washing the left shoulder and arm as well as some of her stomach.  She was able to wash her legs with only supervision, except for the right foot which was washed with a washcloth using the LUE.  Close supervision for all sit to stand and standing with min assist to utilize the RUE on the grab bar or arms of the chair.  Pt still with wanting to reach back with the RUE without flexion trunk with transition to sitting.  Mod hand over hand assist for using the RUE to brush her hair.  Will attempt to apply foam grip over handle to assist with holding brush this pm.  Pt completed drying her hair with setup as well as oral hygiene, both from the wheelchair level.  Pt left with call button in reach and ready for next session with PT.    Therapy Documentation Precautions:  Precautions Precautions: Fall Precaution Comments: R hemiplegia UE more impaired than LE Required Braces or Orthoses: Sling Restrictions Weight Bearing Restrictions: No  Pain: Pain Assessment Pain Assessment: No/denies pain ADL: See Function Navigator for Current Functional Status.   Therapy/Group: Individual Therapy  Cosby Proby  OTR/L 06/28/2015, 12:31 PM

## 2015-06-28 NOTE — Progress Notes (Signed)
Subjective/Complaints: Asking about abx  Review of systems negative for chest pain, shortness of breath, nausea vomiting diarrhea constipation  Objective: Vital Signs: Blood pressure 97/47, pulse 95, temperature 98.1 F (36.7 C), temperature source Oral, resp. rate 18, height 5' 1"  (1.549 m), weight 51.9 kg (114 lb 6.7 oz), SpO2 97 %. No results found. Results for orders placed or performed during the hospital encounter of 06/16/15 (from the past 72 hour(s))  Basic metabolic panel     Status: Abnormal   Collection Time: 06/25/15  1:40 PM  Result Value Ref Range   Sodium 135 135 - 145 mmol/L   Potassium 4.0 3.5 - 5.1 mmol/L   Chloride 101 101 - 111 mmol/L   CO2 24 22 - 32 mmol/L   Glucose, Bld 178 (H) 65 - 99 mg/dL   BUN 14 6 - 20 mg/dL   Creatinine, Ser 0.73 0.44 - 1.00 mg/dL   Calcium 9.2 8.9 - 10.3 mg/dL   GFR calc non Af Amer >60 >60 mL/min   GFR calc Af Amer >60 >60 mL/min    Comment: (NOTE) The eGFR has been calculated using the CKD EPI equation. This calculation has not been validated in all clinical situations. eGFR's persistently <60 mL/min signify possible Chronic Kidney Disease.    Anion gap 10 5 - 15      General: No acute distress. Vital signs reviewed. Psych: Mood and affect are appropriate Heart: Regular rate and rhythm no rubs murmurs or extra sounds Lungs: Clear to auscultation, breathing unlabored, no rales or wheezes, decreased breath sounds at bases Abdomen: Positive bowel sounds, soft nontender to palpation, nondistended Skin: No evidence of breakdown, no evidence of rash Neurologic: Alert and oriented  Motor strength is 5/5 in left deltoid, bicep, tricep, grip, hip flexor, knee extensors, ankle dorsiflexor and plantar flexor 4-/5 minus right deltoid, 3+/5 bicep, tricep, 3+/5 grip 4 right hip flexor and knee extensor and 4-/5 ankle dorsiflexor Musculoskeletal: No edema. No tenderness.  Assessment/Plan: 1. Functional deficits secondary to Right side  weakness/dysphagia secondary to left frontal lobe infarct complicated by respiratory failure from septic shock. which require 3+ hours per day of interdisciplinary therapy in a comprehensive inpatient rehab setting. Physiatrist is providing close team supervision and 24 hour management of active medical problems listed below. Physiatrist and rehab team continue to assess barriers to discharge/monitor patient progress toward functional and medical goals. FIM: Function - Bathing Bathing activity did not occur: Refused Position: Standing at sink Body parts bathed by patient: Buttocks, Front perineal area (Pt washed under arms on both sides with  mod assist for washing under the left) Body parts bathed by helper: Back, Right lower leg, Left upper leg, Right upper leg, Abdomen, Chest, Left arm Bathing not applicable: Right arm, Left arm, Chest, Abdomen, Right upper leg, Left upper leg, Right lower leg, Left lower leg, Back Assist Level: Touching or steadying assistance(Pt > 75%)  Function- Upper Body Dressing/Undressing What is the patient wearing?: Pull over shirt/dress Pull over shirt/dress - Perfomed by patient: Thread/unthread right sleeve, Put head through opening, Pull shirt over trunk Pull over shirt/dress - Perfomed by helper: Thread/unthread left sleeve Assist Level: Supervision or verbal cues Function - Lower Body Dressing/Undressing What is the patient wearing?: Pants, Underwear, Shoes Position: Sitting EOB Underwear - Performed by patient: Thread/unthread right underwear leg, Thread/unthread left underwear leg, Pull underwear up/down Underwear - Performed by helper: Thread/unthread right underwear leg, Thread/unthread left underwear leg Pants- Performed by patient: Thread/unthread right pants leg, Thread/unthread left pants  leg Pants- Performed by helper: Pull pants up/down Socks - Performed by patient: Don/doff left sock Socks - Performed by helper: Don/doff right sock Shoes -  Performed by patient: Don/doff right shoe, Don/doff left shoe Shoes - Performed by helper: Fasten right, Fasten left TED Hose - Performed by helper: Don/doff right TED hose, Don/doff left TED hose Assist for footwear: Supervision/touching assist Assist for lower body dressing: Touching or steadying assistance (Pt > 75%)  Function - Toileting Toileting steps completed by patient: Adjust clothing prior to toileting Toileting steps completed by helper: Performs perineal hygiene, Adjust clothing after toileting Toileting Assistive Devices: Grab bar or rail Assist level: Touching or steadying assistance (Pt.75%)  Function - Air cabin crew transfer assistive device: Grab bar Assist level to toilet: Supervision or verbal cues Assist level from toilet: Supervision or verbal cues Assist level to bedside commode (at bedside): Touching or steadying assistance (Pt > 75%) Assist level from bedside commode (at bedside): Touching or steadying assistance (Pt > 75%)  Function - Chair/bed transfer Chair/bed transfer method: Ambulatory Chair/bed transfer assist level: Supervision or verbal cues Chair/bed transfer assistive device: Armrests, Cane Chair/bed transfer details: Verbal cues for sequencing, Verbal cues for technique, Verbal cues for precautions/safety, Manual facilitation for weight shifting, Manual facilitation for placement  Function - Locomotion: Wheelchair Will patient use wheelchair at discharge?: No Type: Manual Max wheelchair distance: 150 Assist Level: Supervision or verbal cues Assist Level: Supervision or verbal cues Wheel 150 feet activity did not occur: Safety/medical concerns Assist Level: Supervision or verbal cues Turns around,maneuvers to table,bed, and toilet,negotiates 3% grade,maneuvers on rugs and over doorsills: No Function - Locomotion: Ambulation Assistive device: Cane-straight Max distance: 200 Assist level: Supervision or verbal cues Assist level:  Supervision or verbal cues Walk 50 feet with 2 turns activity did not occur: Safety/medical concerns Assist level: Supervision or verbal cues Walk 150 feet activity did not occur: Safety/medical concerns Assist level: Supervision or verbal cues Walk 10 feet on uneven surfaces activity did not occur: Safety/medical concerns Assist level: Touching or steadying assistance (Pt > 75%)  Function - Comprehension Comprehension: Auditory Comprehension assist level: Understands complex 90% of the time/cues 10% of the time  Function - Expression Expression: Verbal Expression assist level: Expresses complex 90% of the time/cues < 10% of the time  Function - Social Interaction Social Interaction assist level: Interacts appropriately 90% of the time - Needs monitoring or encouragement for participation or interaction.  Function - Problem Solving Problem solving assist level: Solves basic problems with no assist  Function - Memory Memory assist level: Recognizes or recalls 90% of the time/requires cueing < 10% of the time Patient normally able to recall (first 3 days only): Current season, Location of own room, Staff names and faces, That he or she is in a hospital  Medical Problem List and Plan: 1.  Right side weakness/dysphagia secondary to left frontal lobe infarct complicated by respiratory failure  Continue CIR 2.  DVT Prophylaxis/Anticoagulation: SCDs. Monitor for any signs of DVT 3. Pain Management: Tylenol as needed 4. Dysphagia/esophageal stricture. Status post gastrostomy tube 06/14/2015 per interventional radiology. Speech therapy follow-up. Plan outpatient EGD endoscopy for dilatation of stricture, swallowing dysfunction improving when comparing the modified barium swallow performed 06/09/2015 versus 06/15/2015,-Intake 25-50% meals 5. Neuropsych: This patient is capable of making decisions on her own behalf. 6. Skin/Wound Care: Routine skin checks, gastrostomy tube site looks okay.  Continue daily normal saline rinses 7. Fluids/Electrolytes/Nutrition: Routine I&O's 8. ID. Strep pyogenes bacteremia. Followed by infectious disease.  Continue ceftriaxone questions 6 week course and confirm with Dr. Drucilla Schmidt.  9. Atrial flutter with RVR. 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 Discuss resuming plaquenil and chronic prednisone.no active joint inflammation at the current time 11. Hyperglycemia secondary to chronic prednisone/tube feeds. Hemoglobin A1c 6.6.  12. Hyperlipidemia. Lipitor, patient has elevated liver function tests, reduced Lipitor from 40 mg to 20 mg and recheck C met  in 1-2 weeks 13. Hypothyroidism. Resume Synthroid 14. Elevated LFTs, had recent gallbladder ultrasound which was negative. Cholelithiasis but no clinical evidence of cholecystitis 15.  Hematuria- has UTI, ecoli and yeast, if this persists post tx then uro f/u  LOS (Days) 12 A FACE TO FACE EVALUATION WAS PERFORMED  Lorenzo Arscott E 06/28/2015, 7:30 AM

## 2015-06-28 NOTE — Progress Notes (Signed)
Speech Language Pathology Daily Session Note  Patient Details  Name: [name] [name] MRN: [id number] Date of Birth: [birth year]  Today's Date: 06/28/2015 SLP Individual Time: 1505-1530 SLP Individual Time Calculation (min): 25 min  Short Term Goals: Week 2: SLP Short Term Goal 1 (Week 2): Pt will utilize compensatory memory strategies to facilitate recall of complex information wtih supervision verbal cues.   SLP Short Term Goal 2 (Week 2): Pt will complete semi-complex functional home management tasks for >80% accuracy at mod I for increased time/use of compensatory strategies. SLP Short Term Goal 3 (Week 2): Pt will tolerate trials of dysphagia 3 textures without s/s aspiration or anxiety. SLP Short Term Goal 4 (Week 2): Pt will complete pharyngeal strengthening exercises at mod I.  Skilled Therapeutic Interventions:  Pt was seen for skilled ST targeting dysphagia goals.  SLP facilitated the session with trials of regular, unthickened liquids following oral care to continue working towards liquids progression.  Pt demonstrated audible, multiple swallows across all trials.  One instance of throat clearing evident with initial bolus of thins; however, no additional s/s of aspiration were evident despite being challenged with multiple large, consecutive boluses. Pt would benefit from implementation of the water protocol and repeat objective swallow study at next available appointment due to improvements noted at bedside.  Both pt ant pt's husband aware of and in agreement with recommended plan of care.  Pt left in wheelchair with call bell within reach and husband at bedside.  Continue per current plan of care.     Function:  Eating Eating   Modified Consistency Diet: Yes Eating Assist Level: More than reasonable amount of time           Cognition Comprehension Comprehension assist level: Understands complex 90% of the time/cues 10% of the time  Expression   Expression assist level:  Expresses complex 90% of the time/cues < 10% of the time  Social Interaction Social Interaction assist level: Interacts appropriately 90% of the time - Needs monitoring or encouragement for participation or interaction.  Problem Solving Problem solving assist level: Solves basic problems with no assist  Memory Memory assist level: Recognizes or recalls 90% of the time/requires cueing < 10% of the time    Pain Pain Assessment Pain Assessment: No/denies pain  Therapy/Group: Individual Therapy  [name] [name], [name] [name] 06/28/2015, 4:44 PM

## 2015-06-28 NOTE — Progress Notes (Signed)
Occupational Therapy Session Note  Patient Details  Name: [name] [name] MRN: [id number] Date of Birth: 1955/04/29  Today's Date: 06/28/2015 OT Individual Time: 1300-1400 OT Individual Time Calculation (min): 60 min    Skilled Therapeutic Interventions/Progress Updates:    Pt completed toilet transfer and toileting to start session with supervision using the single point cane.  She was able to wash her hands in standing after toileting with supervision as well, then ambulated down to the therapy gym.  Occasional staggering with use of cane, needing min instructional cueing to position cane with stepping of the LLE.  No physical assist needed to correct balance at any time during session.  Practiced simulated walk-in shower transfers with supervision as well.  Pt will check with husband on height of toilet in order to decide if she needs the 3:1.  Ambulated to the kitchen for simple meal prep as well.  Had pt ambulate using the counters for balance as well as for transporting items to heat up lima beans.  She was able to use the RUE as a stabilizer with min instructional cueing and min assist to hold beans  when opening or when attempting to spoon out butter from container.  She stood for greater than 20 mins before needing rest during activity.  Had her utilize the RUE to wash the countertops and part of stove, away from heated area with supervision.   Pt reports that initially her husband will be with her at home so she will have assist with meal prep. Ambulated back to room at end of session with close supervision.  Pt left with call button and phone within reach.     Therapy Documentation Precautions:  Precautions Precautions: Fall Precaution Comments: R hemiplegia UE more impaired than LE Required Braces or Orthoses: Sling Restrictions Weight Bearing Restrictions: No  Pain: Pain Assessment Pain Assessment: No/denies pain ADL:   See Function Navigator for Current Functional  Status.   Therapy/Group: Individual Therapy  Win Guajardo OTR/L 06/28/2015, 3:58 PM

## 2015-06-28 NOTE — Progress Notes (Signed)
Physical Therapy Session Note  Patient Details  Name: [name] [name] MRN: [id number] Date of Birth: 1955/08/06  Today's Date: 06/28/2015 PT Individual Time: 1004-1104 PT Individual Time Calculation (min): 60 min   Short Term Goals: Week 2:   =LTG  Skilled Therapeutic Interventions/Progress Updates:   Pt received in w/c; pt reporting husband to be present tomorrow for family education prior to D/C on Wednesday.  Discussed equipment needs and f/u therapy for D/C.  Will alert social worker to order [facility] for home.  Discussed possible need for w/c for community due to poor endurance; pt will discuss with husband.  Pt performed gait with SPC in controlled environment x 150' with supervision.  Continued gait training with negotiation up/down ramp and curb to simulate community environment (church and restaurants) with close supervision and verbal cues for sequencing.  Also performed stair negotiation training with [facility] for home entry/exit; performed up/down 12 stairs with supervision and bilat rails and handing SPC to "husband" during stair negotiation.  Reviewed sequence for floor <>furniture transfer; pt able to lower self to floor with min A but required mod A to return to quadruped and to stand from half kneeling due to weakness.  Continued gait training with focus on functional dual task gait for home; performed gait through obstacle course while holding empty cup in RUE with attention to cup and negotiating safely through obstacles with supervision.  Also performed gait with SPC and reaching to floor to pick up objects with RUE and min A for balance.  Returned to room and pt left seated in w/c with all items within reach.      Therapy Documentation Precautions:  Precautions Precautions: Fall Precaution Comments: R hemiplegia UE more impaired than LE Required Braces or Orthoses: Sling Restrictions Weight Bearing Restrictions: No Vital Signs: Therapy Vitals Pulse Rate: 85 BP: 106/67 mmHg Patient  Position (if appropriate): Sitting Pain: Pain Assessment Pain Assessment: No/denies pain  See Function Navigator for Current Functional Status.   Therapy/Group: Individual Therapy  Raylene Everts [facility] 06/28/2015, 12:36 PM

## 2015-06-29 ENCOUNTER — Inpatient Hospital Stay (HOSPITAL_COMMUNITY): Payer: BC Managed Care – PPO | Admitting: Speech Pathology

## 2015-06-29 ENCOUNTER — Inpatient Hospital Stay (HOSPITAL_COMMUNITY): Payer: BC Managed Care – PPO | Admitting: Occupational Therapy

## 2015-06-29 ENCOUNTER — Inpatient Hospital Stay (HOSPITAL_COMMUNITY): Payer: BC Managed Care – PPO

## 2015-06-29 ENCOUNTER — Inpatient Hospital Stay (HOSPITAL_COMMUNITY): Payer: BC Managed Care – PPO | Admitting: Physical Therapy

## 2015-06-29 NOTE — Discharge Summary (Signed)
[name] [name], [name] [name]                ACCOUNT NO.:  [id number]  MEDICAL RECORD NO.:  [id number]  LOCATION:                                 FACILITY:  PHYSICIAN:  Charlett Blake, M.D.DATE OF BIRTH:  17-May-1955  DATE OF ADMISSION:  06/16/2015 DATE OF DISCHARGE: 06/30/2015                              DISCHARGE SUMMARY   DISCHARGE DIAGNOSES: 1. 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 2. SCDs for deep vein thrombosis prophylaxis. 3. Pain management. 4. Dysphagia status post gastrostomy tube. 5. Streptococcus pyogenes bacteremia with intravenous Rocephin x6     weeks total. 6. Atrial flutter. 7. Rheumatoid arthritis. 8. Hyperlipidemia. 9. Hypothyroidism.  HISTORY OF PRESENT ILLNESS:  This is a 60 year old right-handed female with history of Sjogren disease with rheumatoid arthritis.  Recently diagnosed prior with flu, placed on Tamiflu.  She was independent prior to admission, living with her husband.  Presented on May 29, 2015, right-sided weakness and gait instability.  The patient with severe restrictive airway distress when presented to the ED with oxygen saturations in the 70s, requiring non-rebreather mask, later intubated, slow extubation, followup per Critical Care Pulmonary Services.  Chest x- ray showed large right consolidative changes.  CT angiogram of the chest, no evidence of pulmonary emboli.  Cranial CT scan negative.  MRI of the brain showed a small left frontal lobe infarct.  Echocardiogram with ejection fraction of 65%.  No wall motion abnormalities.  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  EEG negative for seizure.  The patient did not receive tPA. Blood culture showed group A strep pansensitive, May 30, 2015, placed on Rocephin.  Hospital course, atrial flutter.  Placed on Lovenox.  The patient asymptomatic.  Low-dose beta-blocker  added. Modified barium swallow, June 09, 2015, showed severe pharyngeal dysphagia as well as esophageal stricture.  Underwent gastrostomy tube placement, Interventional Radiology, June 14, 2015.  Plavix and aspirin were resumed after PEG tube placed for CVA prophylaxis. Physical and occupational therapy ongoing.  The patient was admitted for comprehensive rehab program.  PAST MEDICAL HISTORY:  See discharge diagnoses.  SOCIAL HISTORY:  Lives with spouse, independent prior to admission. Functional status upon admission to [place] was moderate assist, +2 physical assist for stand pivot transfers.  Min-to-max assist activities of daily living.  PHYSICAL EXAMINATION:  VITAL SIGNS:  Blood pressure 113/62, pulse 95, temperature 97, respirations 19. GENERAL:  This was an alert female, in no acute distress.  Answers full biographical questions. LUNGS:  Decreased breath sounds.  Clear to auscultation. CARDIAC:  Regular rate and rhythm. ABDOMEN:  Soft, nontender.  Good bowel sounds.  REHABILITATION HOSPITAL COURSE:  The patient was admitted to Inpatient Rehab Services with therapies initiated on a 3-hour daily basis, consisting of physical therapy, occupational therapy, speech therapy, and rehabilitation nursing.  The following issues were addressed during the patient's rehabilitation stay.  Pertaining to [name] [name]'s left frontal lobe infarct, remained stable, maintained on aspirin and Plavix therapy.  She would follow up with Neurology Services.  Subcutaneous Lovenox for DVT prophylaxis, no bleeding episodes.  A gastrostomy tube  had been placed for dysphagia, June 14, 2015, per Interventional Radiology.  Diet has slowly been advanced to a dysphagia #3 thin liquids.  There were findings of an esophageal stricture with recommendations of outpatient EGD endoscopy.  Followup Infectious Disease for Streptococcus pyogenes bacteremia per Dr. Rhina Brackett Dam of Infectious  Disease.  The patient remained on Rocephin 2 g every 12 hours until July 09, 2015, and stop.  A Home Health nurse had been arranged.  Blood pressures remained well controlled.  No orthostatic changes.  The patient is participating fully with her therapy programs. She remained on Synthroid for history of hypothyroidism.  She did have a history of rheumatoid arthritis.  She remained on prednisone and Plaquenil as directed and would follow up again with her primary MD. The patient received weekly collaborative interdisciplinary team conferences to discuss estimated length of stay, family teaching, and any barriers to discharge.  Performed gait training with a straight point cane in a controlled environment, 150 feet.  Negotiated up and down stairs, close supervision.  Reviewed sequence for floor furniture transfers, minimal assist.  She could gather her belongings for activities of daily living and homemaking.  She was able to complete transfers with close supervision, utilize washing with close supervision, close supervision for all sit to stand and standing activities of daily living.  Full family teaching was completed and planned to be discharged home.  DISCHARGE MEDICATIONS: 1. Aspirin 81 mg p.o. daily. 2. Lipitor 20 mg p.o. daily. 3. Plavix 75 mg p.o. daily. 4. Ensure 237 mL p.o. b.i.d. 5. Diflucan 100 mg p.o. daily until July 02, 2015, and stop. 6. Synthroid 112 mcg p.o. daily. 7. Lopressor suspension 6.25 mg twice daily by tube. 8. Macrobid 100 mg every 12 hours until July 07, 2015, and stop. 9.Protonix 40 mg daily by tube. 10.Prednisone 10 mg p.o. daily. 11.Intravenous Rocephin 2 g every 12 hours until July 09, 2015, and     stop. 12. Patient can flush PEG tube routinely until no longer needed   DIET:  Her diet was a dysphagia #2 nectar thick liquid.  FOLLOWUP:  The patient would follow up with Dr. Alysia Penna at the [facility] as directed; Dr.  Greggory Keen in regard to any PEG tube issues; Dr. Erlinda Hong, Neurology Service, 1 month, call for appointment; Dr. Rhina Brackett Dam, call for appointment, 2 weeks; Derinda Late, M.D., medical management.  SPECIAL INSTRUCTIONS:  The patient should arrange for EGD in approximately 6 to 8 weeks to evaluate further esophageal stricture, possible need for dilatation.     Lauraine Rinne, P.A.   ______________________________ Charlett Blake, M.D.    DA/MEDQ  D:  06/29/2015  T:  06/29/2015  Job:  [id number]  cc:   Alcide Evener, MD Greggory Keen, MD Derinda Late, MD Rosalin Hawking, M.D. Charlett Blake, M.D.

## 2015-06-29 NOTE — Progress Notes (Signed)
Subjective/Complaints: Discussed diet and swallowing Pt looking fwd to d/c  Review of systems negative for chest pain, shortness of breath, nausea vomiting diarrhea constipation  Objective: Vital Signs: Blood pressure 103/64, pulse 78, temperature 98.4 F (36.9 C), temperature source Oral, resp. rate 18, height 5' 1"  (1.549 m), weight 51.9 kg (114 lb 6.7 oz), SpO2 98 %. No results found. No results found for this or any previous visit (from the past 72 hour(s)).    General: No acute distress. Vital signs reviewed. Psych: Mood and affect are appropriate Heart: Regular rate and rhythm no rubs murmurs or extra sounds Lungs: Clear to auscultation, breathing unlabored, no rales or wheezes, decreased breath sounds at bases Abdomen: Positive bowel sounds, soft nontender to palpation, nondistended Skin: No evidence of breakdown, no evidence of rash Neurologic: Alert and oriented  Motor strength is 5/5 in left deltoid, bicep, tricep, grip, hip flexor, knee extensors, ankle dorsiflexor and plantar flexor 4-/5 minus right deltoid, 3+/5 bicep, tricep, 3+/5 grip 4 right hip flexor and knee extensor and 4-/5 ankle dorsiflexor Musculoskeletal: No edema. No tenderness.  Assessment/Plan: 1. Functional deficits secondary to Right side weakness/dysphagia secondary to left frontal lobe infarct complicated by respiratory failure from septic shock. which require 3+ hours per day of interdisciplinary therapy in a comprehensive inpatient rehab setting. Physiatrist is providing close team supervision and 24 hour management of active medical problems listed below. Physiatrist and rehab team continue to assess barriers to discharge/monitor patient progress toward functional and medical goals. FIM: Function - Bathing Bathing activity did not occur: Refused Position: Research scientist (life sciences) parts bathed by patient: Right arm, Buttocks, Front perineal area, Left lower leg Body parts bathed by helper: Right lower leg, Left  upper leg, Right upper leg, Left arm, Chest, Abdomen (Therapist provided hand over hand assist.) Bathing not applicable: Right arm, Left arm, Chest, Abdomen, Right upper leg, Left upper leg, Right lower leg, Left lower leg, Back Assist Level: Touching or steadying assistance(Pt > 75%)  Function- Upper Body Dressing/Undressing What is the patient wearing?: Pull over shirt/dress Pull over shirt/dress - Perfomed by patient: Thread/unthread right sleeve, Put head through opening, Pull shirt over trunk, Thread/unthread left sleeve Pull over shirt/dress - Perfomed by helper: Thread/unthread left sleeve Assist Level: Set up Function - Lower Body Dressing/Undressing What is the patient wearing?: Pants, Underwear, Shoes, Ted Hose Position: Wheelchair/chair at [employer] - Performed by patient: Thread/unthread right underwear leg, Thread/unthread left underwear leg, Pull underwear up/down Underwear - Performed by helper: Thread/unthread right underwear leg, Thread/unthread left underwear leg Pants- Performed by patient: Thread/unthread right pants leg, Thread/unthread left pants leg, Pull pants up/down Pants- Performed by helper: Pull pants up/down Socks - Performed by patient: Don/doff left sock Socks - Performed by helper: Don/doff right sock Shoes - Performed by patient: Don/doff right shoe, Don/doff left shoe, Fasten right, Fasten left Shoes - Performed by helper: Fasten right, Fasten left TED Hose - Performed by helper: Don/doff right TED hose, Don/doff left TED hose Assist for footwear: Supervision/touching assist Assist for lower body dressing: Touching or steadying assistance (Pt > 75%)  Function - Toileting Toileting steps completed by patient: Adjust clothing prior to toileting, Performs perineal hygiene, Adjust clothing after toileting Toileting steps completed by helper: Performs perineal hygiene, Adjust clothing after toileting Toileting Assistive Devices: Grab bar or rail Assist  level: Supervision or verbal cues  Function - Air cabin crew transfer assistive device: Grab bar Assist level to toilet: Supervision or verbal cues Assist level from toilet: Supervision or verbal  cues Assist level to bedside commode (at bedside): Touching or steadying assistance (Pt > 75%) Assist level from bedside commode (at bedside): Touching or steadying assistance (Pt > 75%)  Function - Chair/bed transfer Chair/bed transfer method: Ambulatory Chair/bed transfer assist level: Supervision or verbal cues Chair/bed transfer assistive device: Armrests, Cane Chair/bed transfer details: Verbal cues for sequencing, Verbal cues for technique, Verbal cues for precautions/safety, Manual facilitation for weight shifting, Manual facilitation for placement  Function - Locomotion: Wheelchair Will patient use wheelchair at discharge?: No Type: Manual Max wheelchair distance: 150 Assist Level: Supervision or verbal cues Assist Level: Supervision or verbal cues Wheel 150 feet activity did not occur: Safety/medical concerns Assist Level: Supervision or verbal cues Turns around,maneuvers to table,bed, and toilet,negotiates 3% grade,maneuvers on rugs and over doorsills: No Function - Locomotion: Ambulation Assistive device: Cane-straight Max distance: 200 Assist level: Supervision or verbal cues Assist level: Supervision or verbal cues Walk 50 feet with 2 turns activity did not occur: Safety/medical concerns Assist level: Supervision or verbal cues Walk 150 feet activity did not occur: Safety/medical concerns Assist level: Supervision or verbal cues Walk 10 feet on uneven surfaces activity did not occur: Safety/medical concerns Assist level: Touching or steadying assistance (Pt > 75%)  Function - Comprehension Comprehension: Auditory Comprehension assist level: Understands complex 90% of the time/cues 10% of the time  Function - Expression Expression: Verbal Expression assist  level: Expresses complex 90% of the time/cues < 10% of the time  Function - Social Interaction Social Interaction assist level: Interacts appropriately 90% of the time - Needs monitoring or encouragement for participation or interaction.  Function - Problem Solving Problem solving assist level: Solves basic problems with no assist  Function - Memory Memory assist level: Recognizes or recalls 90% of the time/requires cueing < 10% of the time Patient normally able to recall (first 3 days only): Current season, Location of own room, Staff names and faces, That he or she is in a hospital  Medical Problem List and Plan: 1.  Right side weakness/dysphagia secondary to left frontal lobe infarct complicated by respiratory failure  Plan d/c in am 2.  DVT Prophylaxis/Anticoagulation: SCDs. Monitor for any signs of DVT 3. Pain Management: Tylenol as needed 4. Dysphagia/esophageal stricture. Status post gastrostomy tube 06/14/2015 per interventional radiology. Speech therapy follow-up. Plan outpatient EGD endoscopy for dilatation of stricture, swallowing dysfunction improving when comparing the modified barium swallow performed 06/09/2015 versus 06/15/2015,-Intake 50% meals plus 2 ensure cans per day 5. Neuropsych: This patient is capable of making decisions on her own behalf. 6. Skin/Wound Care: Routine skin checks, gastrostomy tube site looks okay. Continue daily normal saline rinses 7. Fluids/Electrolytes/Nutrition: Routine I&O's 8. ID. Strep pyogenes bacteremia. Followed by infectious disease. Continue ceftriaxone questions 6 week course and f/u Dr. Drucilla Schmidt.  9. Atrial flutter with RVR. 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 Discuss resuming plaquenil and chronic prednisone.no active joint inflammation at the current time 11. Hyperglycemia secondary to chronic prednisone/tube feeds.  Hemoglobin A1c 6.6.  12. Hyperlipidemia. Lipitor, patient has elevated liver function tests, reduced Lipitor from 40 mg to 20 mg and recheck C met  in 1-2 weeks 13. Hypothyroidism. Resume Synthroid 14. Elevated LFTs, had recent gallbladder ultrasound which was negative. Cholelithiasis but no clinical evidence of cholecystitis 15.  Hematuria- has UTI, ecoli and yeast,no recurrent episodes  LOS (Days) 13 A FACE TO FACE EVALUATION WAS PERFORMED  Alysia Penna E 06/29/2015, 8:03 AM

## 2015-06-29 NOTE — Plan of Care (Signed)
Problem: RH Functional Use of Upper Extremity Goal: LTG Patient will use RT/LT upper extremity as a (OT) LTG: Patient will use right/left upper extremity as a stabilizer/gross assist/diminished/nondominant/dominant level with assist, with/without cues during functional activity (OT)  Outcome: Not Met (add Reason) Needs mod assist overall to use at a diminished level.

## 2015-06-29 NOTE — Progress Notes (Signed)
Occupational Therapy Discharge Summary  Patient Details  Name: [name] [name] MRN: [id number] Date of Birth: 11/02/1955  Today's Date: 06/29/2015 OT Individual Time: 2263-3354 OT Individual Time Calculation (min): 44 min    Session Note:  Pt began session by completing toilet transfer and toileting with supervision using her [place].  Husband provided assistance for this task, with pt ambulating out to the sink to wash her hands once toileting was completed.  He was able to ambulate with her down to the gym, providing assistance for supporting the RUE as she walked.  In the gym educated pt and spouse on home exercise program for the RUE.  Provided handout for reference as pt completed AROM exercises for the shoulder (2 sets 10 repetitions in supine), elbow (2 sets 10 repetitions in sitting unsupported EOM), wrist extension (2 sets 15 repetitions in sitting), and digit flexion and extension ( 15 repetitions for 1 set with AAROM for both).  Discussed ways to modify exercise by changing position to increase input of gravity or using objects such as a cup for reaching tasks.  Pt and spouse voice understanding with pt demonstrating appropriate performance of all exercises with min instructional cueing.  Pt ambulated back to the room with supervision and husband assisting.  Pt left in bedside chair at end of session.  Updated safety plan for pt's husband to assist with toileting as needed.   Patient has met 11 of 12 long term goals due to improved activity tolerance, improved balance, postural control, ability to compensate for deficits, functional use of  RIGHT upper extremity, improved awareness and improved coordination.  Patient to discharge at overall Supervision level.  Patient's care partner is independent to provide the necessary physical assistance at discharge.    Reasons goals not met: Pt continues to need mod assist for RUE functional use at a diminished level.  Recommendation:  Patient will benefit  from ongoing skilled OT services in outpatient setting to continue to advance functional skills in the area of BADL, iADL and Reduce care partner burden.  Pt is currently at an overall supervision level for most tasks but still needs min assist for bathing tasks secondary to RUE weakness.  She has been able to modify her performance of basic selfcare tasks with incorporation of DME and AE as well as hemi techniques to increase independence.  Feel she will continue to benefit from outpatient OT services to further progress RUE functional use in order to increase independence to a diminished level or greater for better functional use with all ADLs and IADLs.    Equipment: 3:1  Reasons for discharge: treatment goals met and discharge from hospital  Patient/family agrees with progress made and goals achieved: Yes  OT Discharge Precautions/Restrictions  Precautions Precautions: Fall Precaution Comments: R hemiplegia UE more impaired than LE Restrictions Weight Bearing Restrictions: No   Pain Pain Assessment Pain Assessment: No/denies pain ADL  See Function Section of chart  Vision/Perception  Vision- History Baseline Vision/History: Wears glasses Wears Glasses: Reading only;Distance only (For reading, watching movies, and driving) Patient Visual Report: No change from baseline Vision- Assessment Vision Assessment?: No apparent visual deficits Eye Alignment: Within Functional Limits  Cognition Overall Cognitive Status: Impaired/Different from baseline Arousal/Alertness: Awake/alert Orientation Level: Oriented X4 Attention: Selective;Alternating Selective Attention: Appears intact Alternating Attention: Appears intact Memory: Appears intact Awareness: Appears intact Problem Solving: Impaired Problem Solving Impairment: Functional complex;Verbal complex Behaviors:  (Anxious at times) Safety/Judgment: Appears intact Comments: Pt at times needs sepecific cueing to complete tasks  such as when telling her to lay down on the mat and positioning pillows on the end, she instead layed back from sitting position instead of laying down toward the pillows.  Sensation Sensation Light Touch: Appears Intact Stereognosis: Not tested Hot/Cold: Not tested Proprioception: Appears Intact Proprioception Impaired Details: Impaired RUE Additional Comments: Proprioception intact in RUE and hand.   Coordination Gross Motor Movements are Fluid and Coordinated: No Fine Motor Movements are Fluid and Coordinated: No Coordination and Movement Description: Pt currently at Brunnstrum stage V level in the arm and stage IV to V in the hand.  Need min assist for use in bathing tasks with  AE (wash mit).  Mod to max assist if attempting to use to pull up pants.   Motor  Motor Motor: Hemiplegia Motor - Discharge Observations: R UE hemiparesis, mild in R LE Mobility  Bed Mobility Bed Mobility: Supine to Sit Supine to Sit: HOB flat;4: Min guard Supine to Sit Details: Verbal cues for technique;Manual facilitation for weight shifting Sit to Supine: 5: Supervision Transfers Transfers: Sit to Stand Sit to Stand: 5: Supervision;From bed;With armrests;With upper extremity assist Sit to Stand Details: Manual facilitation for weight shifting Stand to Sit: 5: Supervision;With armrests;To toilet;With upper extremity assist  Trunk/Postural Assessment  Cervical Assessment Cervical Assessment: Exceptions to [facility] (cervical protraction) Thoracic Assessment Thoracic Assessment: Exceptions to [facility] (thoracic kyphosis) Lumbar Assessment Lumbar Assessment: Exceptions to [facility] Lumbar Strength Overall Lumbar Strength: Deficits (Pt maintains posterior pelvic tilt at rest) Postural Control Postural Limitations: Pt sits in posterior pelvic tilt with slightly increased right trunk elongation, left shoulder elevation, and increased weightbearing over the left hip.    Balance Balance Balance Assessed: Yes Static  Sitting Balance Static Sitting - Balance Support: No upper extremity supported Static Sitting - Level of Assistance: 6: Modified independent (Device/Increase time) Dynamic Sitting Balance Dynamic Sitting - Balance Support: No upper extremity supported Dynamic Sitting - Level of Assistance: 6: Modified independent (Device/Increase time) Static Standing Balance Static Standing - Balance Support: No upper extremity supported Static Standing - Level of Assistance: 5: Stand by assistance Dynamic Standing Balance Dynamic Standing - Level of Assistance: 5: Stand by assistance Extremity/Trunk Assessment RUE Assessment RUE Assessment: Exceptions to [facility] RUE Strength RUE Overall Strength Comments: Pt with Brunnstrum stage V movement in the arm and stage IV to V in the hand.  Still with slight synergy pattern with attempted use of shoulder hike and increased trunk lean to the right.  Gross digit flexion approximately 50% with digit extension to 80 %.  No tone noted but slight scapular winging present at inferior border.   LUE Assessment LUE Assessment: Within Functional Limits   See Function Navigator for Current Functional Status.  Fermin Yan OTR/L 06/29/2015, 5:38 PM

## 2015-06-29 NOTE — Progress Notes (Signed)
Nutrition Follow-up  DOCUMENTATION CODES:   Not applicable  INTERVENTION:  Continue Ensure Enlive po BID, each supplement provides 350 kcal and 20 grams of protein.  Continue free water flushes.  Encourage adequate PO intake.  NUTRITION DIAGNOSIS:   Inadequate oral intake related to inability to eat as evidenced by NPO status; diet advanced; improving  GOAL:   Patient will meet greater than or equal to 90% of their needs; progressing  MONITOR:   PO intake, Supplement acceptance, Weight trends, Labs, I & O's  REASON FOR ASSESSMENT:   Consult Enteral/tube feeding initiation and management  ASSESSMENT:   60 y.o.right handed female with history of Sjogrens disease/rheumatoid arthritis maintained on plaquenil and Prednisone, noted history of seizure but not on antiepileptic meds and dysphagia.  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 Echocardiogram with ejection fraction of 65% no wall motion abnormalities. 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  Modified barium swallow 06/09/2015 shows severe pharyngeal dysphagia as well as esophageal stricture. Patient underwent gastrostomy tube per interventional radiology 06/14/2015.  Diet has been advanced to a dysphagia 3 diet with thin liquids. Meal completion while on dysphagia 2 nectar thick were varied from 25-85%. Pt has Ensure ordered and has been consuming them. RD to continue with orders. Noted, plans for discharge tomorrow. RD to continue free water flushes for now as fluid intake unknown if adequate. If liquid intake post discharge is adequate at home, recommend providing free water flushes of 50 ml once daily via PEG to keep moist. If liquid intake at home is not adequate, recommend continuing current ordered free water flushes.   Diet Order:  DIET DYS 3 Room service appropriate?: Yes; Fluid consistency:: Thin  Skin:   (Incision on abdomen)  Last BM:   3/13  Height:   Ht Readings from Last 1 Encounters:  06/16/15 5\' 1"  (1.549 m)    Weight:   Wt Readings from Last 1 Encounters:  06/28/15 114 lb 6.7 oz (51.9 kg)    Ideal Body Weight:  47.7 kg  BMI:  Body mass index is 21.63 kg/(m^2).  Estimated Nutritional Needs:   Kcal:  1650-1850  Protein:  70-80 grams  Fluid:  1.6  - 1.8 L/day  EDUCATION NEEDS:   No education needs identified at this time  Corrin Parker, MS, RD, LDN Pager # [phone] After hours/ weekend pager # [phone]

## 2015-06-29 NOTE — Progress Notes (Signed)
Physical Therapy Note  Patient Details  Name: [name] [name] MRN: [id number] Date of Birth: [birth year] Today's Date: 06/29/2015    Time: 1400-1440 40 minutes  (pt missed 20 minutes of session due to fatigue)  1:1 No c/o pain. Pt's husband present for education.  Pt/husband educated on energy conservation, safety at home, HEP to include walking with supervision each day.  Pt and husband performed gait in home and controlled environments with pt at supervision level with SPC.  Pt performed stairs with husband supervising with 1 handrail x 12 stairs and with SPC x 8 stairs.  Gait on uneven surface with SPC with supervision.  Car transfer to simulated SUV height with supervision, husband able to cue pt correctly. Pt/husband both agree they feel ready for d/c home tomorrow.  Pt states she would like a w/c for long distances on d/c, case manager notified.  Pt fatigued at end of session, missed final 20 minutes.   Lynford Espinoza 06/29/2015, 2:48 PM

## 2015-06-29 NOTE — Progress Notes (Signed)
Speech Language Pathology Note  Patient Details  Name: Kieya Sandor MRN: [id number] Date of Birth: [birth year] Today's Date: 06/29/2015  MBSS complete. Full report located under chart review in imaging section.  Addendum to swallow study documentation: incorrect time populated in note; study was completed from [phone] for a total time of 25 minutes.  Rechelle Niebla, Selinda Orion 06/29/2015, 4:00 PM

## 2015-06-29 NOTE — Progress Notes (Signed)
Occupational Therapy Session Note  Patient Details  Name: [name] [name] MRN: [id number] Date of Birth: January 01, 1956  Today's Date: 06/29/2015 OT Individual Time: [id number] OT Individual Time Calculation (min): 59 min    Short Term Goals: Week 2:  OT Short Term Goal 1 (Week 2): Pt will continue working toward established LTGs set at supervision level overall.  Skilled Therapeutic Interventions/Progress Updates:    Pt's husband present for family education during ADL.  He was able to observe and assist pt with functional transfers out of the walk-in shower as well as providing min assist for facilitation of the RUE during bathing tasks and also with sit to stand transitions.  Utilized wash mit over the RUE for bathing 70% of her body with min assist.  She was able to complete the rest with supervision using the LUE.  Close supervision for all transfers with use of the single point cane and without.  All dressing completed at the sink with supervision as well except for donning TEDS.  Discussed possible placement of suction cup grab bars in the shower and also a hand held shower as well.  Husband reports that they would like a wheelchair and also a 3:1.  She was able to complete grooming tasks in standing with supervision but is capable of completing them at a modified independent level in sitting.  Pt left in bedside chair with husband present at end of session.   Therapy Documentation Precautions:  Precautions Precautions: Fall Precaution Comments: R hemiplegia UE more impaired than LE Required Braces or Orthoses: Sling Restrictions Weight Bearing Restrictions: No  Pain: Pain Assessment Pain Assessment: No/denies pain ADL: See Function Navigator for Current Functional Status.   Therapy/Group: Individual Therapy  [name] [name] OTR/L 06/29/2015, 12:34 PM

## 2015-06-29 NOTE — Discharge Summary (Signed)
Discharge summary job [phone]

## 2015-06-29 NOTE — Progress Notes (Signed)
Physical Therapy Discharge Summary  Patient Details  Name: [name] [name] MRN: [id number] Date of Birth: 05/04/1955   Patient has met 7 of 7 long term goals due to improved activity tolerance, improved balance, improved postural control, increased strength, ability to compensate for deficits and functional use of  right upper extremity and right lower extremity.  Patient to discharge at an ambulatory level Supervision.   Patient's care partner is independent to provide the necessary supervision assistance at discharge.  Reasons goals not met: n/a  Recommendation:  Patient will benefit from ongoing skilled PT services in home health setting to continue to advance safe functional mobility, address ongoing impairments in balance, R UE strength and coordination, activity tolerance, and minimize fall risk.  Equipment: SPC  Reasons for discharge: treatment goals met and discharge from hospital  Patient/family agrees with progress made and goals achieved: Yes  PT Discharge Precautions/Restrictions Precautions Precautions: Fall Precaution Comments: R hemiplegia UE more impaired than LE Restrictions Weight Bearing Restrictions: No Pain Pain Assessment Pain Assessment: No/denies pain  Cognition Overall Cognitive Status: Within Functional Limits for tasks assessed Arousal/Alertness: Awake/alert Safety/Judgment: Appears intact Sensation Sensation Light Touch: Appears Intact Proprioception Impaired Details: Impaired RUE Coordination Gross Motor Movements are Fluid and Coordinated: No Fine Motor Movements are Fluid and Coordinated: No Coordination and Movement Description: Pt with impaired coordination with R UE during functional tasks Motor  Motor Motor: Hemiplegia Motor - Discharge Observations: R UE hemiparesis, mild in R LE   Trunk/Postural Assessment  Cervical Assessment Cervical Assessment:  (forward head) Thoracic Assessment Thoracic Assessment:  (kyphosis) Lumbar  Assessment Lumbar Assessment: Within Functional Limits Postural Control Postural Limitations: posterior pelvic tilt  Balance Balance Balance Assessed: Yes Dynamic Sitting Balance Dynamic Sitting - Level of Assistance: 6: Modified independent (Device/Increase time) Static Standing Balance Static Standing - Level of Assistance: 5: Stand by assistance Dynamic Standing Balance Dynamic Standing - Level of Assistance: 5: Stand by assistance Extremity Assessment      RLE Strength RLE Overall Strength Comments: grossly 3/5 LLE Strength LLE Overall Strength Comments: grossly 4/5   See Function Navigator for Current Functional Status.  [name] 06/29/2015, 2:54 PM

## 2015-06-30 ENCOUNTER — Inpatient Hospital Stay (HOSPITAL_COMMUNITY): Payer: BC Managed Care – PPO | Admitting: Speech Pathology

## 2015-06-30 ENCOUNTER — Telehealth: Payer: Self-pay | Admitting: *Deleted

## 2015-06-30 LAB — CREATININE, SERUM
Creatinine, Ser: 0.54 mg/dL (ref 0.44–1.00)
GFR calc Af Amer: >60 mL/min (ref >60)

## 2015-06-30 MED ORDER — LEVOTHYROXINE SODIUM 112 MCG PO TABS: 112.0000 ug | ORAL_TABLET | Freq: Every day | ORAL | Status: DC

## 2015-06-30 MED ORDER — NITROFURANTOIN MONOHYD MACRO 100 MG PO CAPS: 100.0000 mg | ORAL_CAPSULE | Freq: Two times a day (BID) | ORAL | Status: DC

## 2015-06-30 MED ORDER — HEPARIN SOD (PORK) LOCK FLUSH 100 UNIT/ML IV SOLN
250.0000 [IU] | INTRAVENOUS | Status: AC | PRN
  Administered 2015-06-30: 250 [IU]

## 2015-06-30 MED ORDER — PANTOPRAZOLE SODIUM 40 MG PO PACK: 40.0000 mg | PACK | Freq: Every day | ORAL | Status: DC

## 2015-06-30 MED ORDER — METOPROLOL TARTRATE 25 MG/10 ML ORAL SUSPENSION: 6.2500 mg | Freq: Two times a day (BID) | ORAL | Status: DC

## 2015-06-30 MED ORDER — ATORVASTATIN CALCIUM 20 MG PO TABS: 20.0000 mg | ORAL_TABLET | Freq: Every day | ORAL | Status: DC

## 2015-06-30 MED ORDER — FLUCONAZOLE 100 MG PO TABS: 100.0000 mg | ORAL_TABLET | Freq: Every day | ORAL | Status: DC

## 2015-06-30 MED ORDER — CLOPIDOGREL BISULFATE 75 MG PO TABS: 75.0000 mg | ORAL_TABLET | Freq: Every day | ORAL | Status: DC

## 2015-06-30 NOTE — Discharge Instructions (Signed)
Inpatient Rehab Discharge Instructions  [name] [name] Discharge date and time: No discharge date for patient encounter.   Activities/Precautions/ Functional Status: Activity: activity as tolerated Diet: Dysphagia #2 nectar liquids Wound Care: keep wound clean and dry Functional status:  ___ No restrictions     ___ Walk up steps independently ___ 24/7 supervision/assistance   ___ Walk up steps with assistance ___ Intermittent supervision/assistance  ___ Bathe/dress independently ___ Walk with walker     ___ Bathe/dress with assistance ___ Walk Independently    ___ Shower independently _x__ Walk with assistance    ___ Shower with assistance ___ No alcohol     ___ Return to work/school ________  COMMUNITY REFERRALS UPON DISCHARGE:   Home Health:   RN  Agency:  [facility] Phone:  [phone] Outpatient: PT   Monday, 07-05-15 @ 2 PM                         OT  Monday, 07-05-15 @ 3 PM                         ST  Wednesday, 07-07-15 @ 2PM  Agency:  [facility] Phone:  [phone]  Medical Equipment/Items Ordered:  Single point cane; 3-in-1 commode; 16"x16" lightweight wheelchair with basic cushion  Agency/Supplier:  [place]         Phone:  [phone]  GENERAL COMMUNITY RESOURCES FOR PATIENT/FAMILY: Support Groups:  [facility] Support Group                              2nd Thursday of the month at 3 PM                              Meets in the dayroom of [address] [address]  Special Instructions:  Intravenous Rocephin 2 g every 12 hours until 07/09/2015.  My questions have been answered and I understand these instructions. I will adhere to these goals and the provided educational materials after my discharge from the hospital.  Patient/Caregiver Signature _______________________________ Date __________  Clinician Signature _______________________________________ Date __________  Please bring this form  and your medication list with you to all your follow-up doctor's appointments.

## 2015-06-30 NOTE — Progress Notes (Signed)
Speech Language Pathology Discharge Summary  Patient Details  Name: [name] [name] MRN: [id number] Date of Birth: [birth year]  Today's Date: 06/30/2015 SLP Individual Time: 0803-0830 SLP Individual Time Calculation (min): 27 min   Skilled Therapeutic Interventions:  Pt was seen for skilled ST targeting swallowing goals.  Pt was observed with presentations of her currently prescribed diet to monitor toleration of advancement per MBS yesterday.  Pt consumed Dys 3 textures and thin liquids with mod I use of swallowing precautions.  Pt demonstrated x1 instance of wet vocal quality following mixed consistencies which pt mitigated with self initiated use of throat clear.  Pt presented with questions about recommended diet textures.  Pt was provided with a handout of dys 3 textures and SLP discussed recommended consistencies as well as textures/foods to avoid.  All questions were answered to pt's satisfaction at this time.  Pt was left upright in wheelchair with call bell within reach.  Pt is discharging home today with recommendations for OP ST follow up.      Patient has met 4 of 4 long term goals.  Patient to discharge at overall Modified Independent level.  Reasons goals not met: n/a   Clinical Impression/Discharge Summary:  Pt made excellent gains while inpatient and is discharging having met 4 out of 4 long term goals.  Pt's diet has been upgraded to Dys 3, thin liquids per MBS and is utilizing compensatory swallowing strategies with mod I.  Pt is back to baseline for cognition.  Recommend outpatient ST follow up for management of diet toleration and diet progression.  Pt and family education is complete at this time.  Pt is discharging home with 24/7 supervision from family and outpatient ST follow up for swallowing.    Care Partner:  Caregiver Able to Provide Assistance: Yes  Type of Caregiver Assistance: Physical;Cognitive  Recommendation:  Outpatient SLP  Rationale for SLP Follow Up: Maximize  swallowing safety   Equipment: none recommended by SLP    Reasons for discharge: Discharged from hospital   Patient/Family Agrees with Progress Made and Goals Achieved: Yes   Function:  Eating Eating   Modified Consistency Diet: Yes Eating Assist Level: More than reasonable amount of time           Cognition Comprehension Comprehension assist level: Follows complex conversation/direction with extra time/assistive device  Expression   Expression assist level: Expresses complex ideas: With extra time/assistive device  Social Interaction Social Interaction assist level: Interacts appropriately with others - No medications needed.  Problem Solving Problem solving assist level: Solves complex problems: With extra time  Memory Memory assist level: More than reasonable amount of time   [name] L 06/30/2015, 11:02 AM

## 2015-06-30 NOTE — Progress Notes (Signed)
Social Work Discharge Note  The overall goal for the admission was met for:   Discharge location: Yes - home with husband  Length of Stay: Yes - 14 days  Discharge activity level: Yes - supervision  Home/community participation: Yes  Services provided included: MD, RD, PT, OT, SLP, RN, Pharmacy, Neuropsych and SW  Financial Services: Private Insurance: [employer]  Follow-up services arranged: Home Health: RN for IV Rocephin from [facility], Outpatient: PT/OT/ST at [facility], DME: [phone]" lightweight w/c with basic cushion; 3-in-1 commode; single point cane from [place] and Patient/Family has no preference for HH/DME agencies  Comments (or additional information): Pt to return home with her husband initially and and then friends/paid caregivers, as needed.  Husband's schedule is flexible, so he will be with pt for a while.  He is also managing all of pt's needed paperwork from her employer ([employer]).  Pt will have [facility] for IV antibiotics and outpt therapies.  Signed handicap placard application given to pt/husband for someone to take to the plate agency.  DME delivered to the room.  Patient/Family verbalized understanding of follow-up arrangements: Yes  Individual responsible for coordination of the follow-up plan: pt with her husband  Confirmed correct DME delivered: Trey Sailors 06/30/2015    [name], Silvestre Mesi

## 2015-06-30 NOTE — Progress Notes (Addendum)
Subjective/Complaints: Diet upgraded to D3 thin  Review of systems negative for chest pain, shortness of breath, nausea vomiting diarrhea constipation  Objective: Vital Signs: Blood pressure 98/59, pulse 73, temperature 98.1 F (36.7 C), temperature source Oral, resp. rate 18, height 5' 1"  (1.549 m), weight 52.164 kg (115 lb), SpO2 97 %. Dg Swallowing Func-speech Pathology  06/29/2015  Objective Swallowing Evaluation: Type of Study: MBS-Modified Barium Swallow Study Patient Details Name: [name] [name] MRN: [id number] Date of Birth: [birth year] Today's Date: 06/29/2015 Time: SLP Start Time (ACUTE ONLY): 1206-SLP Stop Time (ACUTE ONLY): 1223 SLP Time Calculation (min) (ACUTE ONLY): 17 min Past Medical History: Past Medical History Diagnosis Date . Hypothyroidism  . Seizures ([place])  . Adenopathy  . Scoliosis  . Spleen anomaly  . Anemia  . Sjogren's disease ([place])  . Raynaud disease  . Shingles  . Osteopenia  Past Surgical History: Past Surgical History Procedure Laterality Date . Tonsillectomy   . Breast cyst aspiration Right 1990's   neg HPI: [name] [name] is a 60 y/o woman with a history of Sjogren's disease, hypothyroidism and dysphagia. She was diagnosed with the flu (A) about 4 days PTA and started on tamiflu. 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  Intubated from 2/12 to 2/21.There is a question of esophageal stricture; pt was seen by GI at [facility], complained of globus with pills and some solids, occuring 2-3x a week. Was scheduled for an EGD at [facility], but appears to have cancelled the procedure. 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 Subjective: pt alert, without complaints Assessment / Plan / Recommendation CHL IP CLINICAL IMPRESSIONS 06/29/2015 Therapy Diagnosis Mild pharyngeal phase dysphagia Clinical Impression Pt presents with ongoing improvement in swallowing  function in comparison to previous objective study.  Pt continues to present with pharyngeal weakness characterized by decreased anterior laryngeal movement with decreased epiglottic deflection and decreased base of tongue retraction which results in delayed swallow initiation and pharyngeal residue at the level of the vallecula and the pyriforms.  Pooling of thin liquids in the pyriforms was noted to spill into the airway during the swallow but was ejected prior to reaching the vocal cords with smaller boluses.  Deep penetration to the cords with larger boluses of thins was eventually cleared with multiple swallows and strong volitional cough.  Multiple swallows also remains an effective and needed strategy for clearing residual solids and purees from the pharynx.  Recommend advancing pt to Dys 3 solids and thin liquids, meds crushed in purees with intermittent supervision for use of swallowing precautions: slow rate, small bites/sips, multiple swallows, throat clear after sips of thins.  SLP will follow up tomorrow prior to discharge to assess toleration of upgrade during breakfast meal.  Prognosis for continued advancement good with OP ST follow up for trials of advanced consistencies and management of diet toleration.   Impact on safety and function Mild aspiration risk     Prognosis 06/22/2015 Prognosis for Safe Diet Advancement Good Barriers to Reach Goals -- Barriers/Prognosis Comment -- CHL IP DIET RECOMMENDATION 06/29/2015 SLP Diet Recommendations Dysphagia 3 (M[employer] soft) solids;Thin liquid Liquid Administration via Cup Medication Administration Crushed with puree Compensations Slow rate;Small sips/bites;Multiple dry swallows after each bite/sip;Hard cough after swallow;Effortful swallow Postural Changes --   CHL IP OTHER RECOMMENDATIONS 06/15/2015 Recommended Consults -- Oral Care Recommendations Oral care BID Other Recommendations --  CHL IP ORAL PHASE 06/29/2015 Oral Phase WFL Oral - Pudding Teaspoon  -- Oral - Pudding Cup -- Oral - Honey Teaspoon -- Oral - Honey Cup -- Oral - Nectar Teaspoon -- Oral - Nectar Cup -- Oral - Nectar Straw -- Oral - Thin Teaspoon -- Oral - Thin Cup -- Oral - Thin Straw -- Oral - Puree -- Oral - Mech Soft -- Oral - Regular -- Oral - Multi-Consistency -- Oral - Pill -- Oral Phase - Comment --  CHL IP PHARYNGEAL PHASE 06/29/2015 Pharyngeal Phase Impaired Pharyngeal- Pudding Teaspoon -- Pharyngeal -- Pharyngeal- Pudding Cup -- Pharyngeal -- Pharyngeal- Honey Teaspoon -- Pharyngeal -- Pharyngeal- Honey Cup -- Pharyngeal -- Pharyngeal- Nectar Teaspoon -- Pharyngeal -- Pharyngeal- Nectar Cup Delayed swallow initiation-vallecula;Reduced epiglottic inversion;Reduced anterior laryngeal mobility;Pharyngeal residue - valleculae;Pharyngeal residue - pyriform;Other (Comment);Reduced tongue base retraction Pharyngeal -- Pharyngeal- Nectar Straw -- Pharyngeal -- Pharyngeal- Thin Teaspoon (No Data) Pharyngeal -- Pharyngeal- Thin Cup Reduced epiglottic inversion;Reduced anterior laryngeal mobility;Delayed swallow initiation-vallecula;Reduced laryngeal elevation;Reduced tongue base retraction;Penetration/Aspiration during swallow;Pharyngeal residue - valleculae;Pharyngeal residue - pyriform;Other (Comment) Pharyngeal Material enters airway, CONTACTS cords and then ejected out Pharyngeal- Thin Straw Delayed swallow initiation-vallecula;Reduced epiglottic inversion;Reduced anterior laryngeal mobility;Penetration/Aspiration during swallow;Reduced tongue base retraction;Pharyngeal residue - valleculae;Pharyngeal residue - pyriform Pharyngeal Material enters airway, CONTACTS cords and then ejected out Pharyngeal- Puree Delayed swallow initiation-vallecula;Reduced epiglottic inversion;Reduced anterior laryngeal mobility;Reduced tongue base retraction;Pharyngeal residue - pyriform;Pharyngeal residue - valleculae Pharyngeal -- Pharyngeal- Mechanical Soft -- Pharyngeal -- Pharyngeal- Regular Delayed swallow  initiation-vallecula;Reduced epiglottic inversion;Reduced anterior laryngeal mobility;Reduced tongue base retraction;Pharyngeal residue - valleculae;Pharyngeal residue - pyriform Pharyngeal -- Pharyngeal- Multi-consistency -- Pharyngeal -- Pharyngeal- Pill -- Pharyngeal -- Pharyngeal Comment --  CHL IP CERVICAL ESOPHAGEAL PHASE 06/15/2015 Cervical Esophageal Phase Impaired Pudding Teaspoon -- Pudding Cup -- Honey Teaspoon -- Honey Cup -- Nectar Teaspoon Reduced cricopharyngeal relaxation Nectar Cup -- Nectar Straw -- Thin Teaspoon Reduced cricopharyngeal relaxation Thin Cup -- Thin Straw -- Puree -- Mechanical Soft -- Regular -- Multi-consistency -- Pill -- Cervical Esophageal Comment -- No flowsheet data found. Page, Selinda Orion 06/29/2015, 1:28 PM              Results for orders placed or performed during the hospital encounter of 06/16/15 (from the past 72 hour(s))  Creatinine, serum     Status: None   Collection Time: 06/30/15  3:45 AM  Result Value Ref Range   Creatinine, Ser 0.54 0.44 - 1.00 mg/dL   GFR calc non Af Amer >60 >60 mL/min   GFR calc Af Amer >60 >60 mL/min    Comment: (NOTE) The eGFR has been calculated using the CKD EPI equation. This calculation has not been validated in all clinical situations. eGFR's persistently <60 mL/min signify possible Chronic Kidney Disease.       General: No acute distress. Vital signs reviewed. Psych: Mood and affect are appropriate Heart: Regular rate and rhythm no rubs murmurs or extra sounds Lungs: Clear to auscultation, breathing unlabored, no rales or wheezes, decreased breath sounds at bases Abdomen: Positive bowel sounds, soft nontender to palpation, nondistended Skin: No evidence of breakdown, no evidence of rash Neurologic: Alert and oriented  Motor strength is 5/5 in left deltoid, bicep, tricep, grip, hip flexor, knee extensors, ankle dorsiflexor and plantar flexor 4-/5 minus right deltoid, 3+/5 bicep, tricep, 3+/5 grip 4 right hip flexor  and knee extensor and 4-/5 ankle dorsiflexor Musculoskeletal: No edema. No tenderness.  Assessment/Plan: 1. Functional deficits secondary to Right side weakness/dysphagia secondary to left frontal  lobe infarct complicated by respiratory failure from septic shock. Stable for D/C today F/u PCP in 3-4 weeks F/u PM&R 2 weeks F/U ID See D/C summary See D/C instructions FIM: Function - Bathing Bathing activity did not occur: Refused Position: Shower Body parts bathed by patient: Right arm, Buttocks, Front perineal area, Left lower leg, Chest, Abdomen, Left upper leg, Right lower leg, Right upper leg Body parts bathed by helper: Left arm Bathing not applicable: Right arm, Left arm, Chest, Abdomen, Right upper leg, Left upper leg, Right lower leg, Left lower leg, Back Assist Level: Touching or steadying assistance(Pt > 75%)  Function- Upper Body Dressing/Undressing What is the patient wearing?: Pull over shirt/dress Pull over shirt/dress - Perfomed by patient: Thread/unthread right sleeve, Put head through opening, Pull shirt over trunk, Thread/unthread left sleeve Pull over shirt/dress - Perfomed by helper: Thread/unthread left sleeve Assist Level: Set up Function - Lower Body Dressing/Undressing What is the patient wearing?: Pants, Underwear, Shoes, Ted Hose Position: Wheelchair/chair at [employer] - Performed by patient: Thread/unthread right underwear leg, Thread/unthread left underwear leg, Pull underwear up/down Underwear - Performed by helper: Thread/unthread right underwear leg, Thread/unthread left underwear leg Pants- Performed by patient: Thread/unthread right pants leg, Thread/unthread left pants leg, Pull pants up/down Pants- Performed by helper: Pull pants up/down Socks - Performed by patient: Don/doff right sock, Don/doff left sock Socks - Performed by helper: Don/doff right sock Shoes - Performed by patient: Don/doff right shoe, Don/doff left shoe, Fasten right, Fasten  left Shoes - Performed by helper: Fasten right, Fasten left TED Hose - Performed by helper: Don/doff right TED hose, Don/doff left TED hose Assist for footwear: Supervision/touching assist Assist for lower body dressing: Touching or steadying assistance (Pt > 75%)  Function - Toileting Toileting steps completed by patient: Adjust clothing prior to toileting, Performs perineal hygiene, Adjust clothing after toileting Toileting steps completed by helper: Adjust clothing prior to toileting, Performs perineal hygiene, Adjust clothing after toileting Toileting Assistive Devices: Grab bar or rail Assist level: No help/no cues  Function - Toilet Transfers Toilet transfer assistive device: Grab bar Assist level to toilet: No Help, no cues, assistive device, takes more than a reasonable amount of time Assist level from toilet: No Help, no cues, assistive device, takes more than a reasonable amount of time Assist level to bedside commode (at bedside): Touching or steadying assistance (Pt > 75%) Assist level from bedside commode (at bedside): Touching or steadying assistance (Pt > 75%)  Function - Chair/bed transfer Chair/bed transfer method: Ambulatory Chair/bed transfer assist level: Supervision or verbal cues Chair/bed transfer assistive device: Armrests, Cane Chair/bed transfer details: Verbal cues for sequencing, Verbal cues for technique, Verbal cues for precautions/safety, Manual facilitation for weight shifting, Manual facilitation for placement  Function - Locomotion: Wheelchair Will patient use wheelchair at discharge?: No Type: Manual Max wheelchair distance: 150 Assist Level: Supervision or verbal cues Assist Level: Supervision or verbal cues Wheel 150 feet activity did not occur: Safety/medical concerns Assist Level: Supervision or verbal cues Turns around,maneuvers to table,bed, and toilet,negotiates 3% grade,maneuvers on rugs and over doorsills: No Function - Locomotion:  Ambulation Assistive device: Cane-straight Max distance: 200 Assist level: Supervision or verbal cues Assist level: Supervision or verbal cues Walk 50 feet with 2 turns activity did not occur: Safety/medical concerns Assist level: Supervision or verbal cues Walk 150 feet activity did not occur: Safety/medical concerns Assist level: Supervision or verbal cues Walk 10 feet on uneven surfaces activity did not occur: Safety/medical concerns Assist level: Supervision or  verbal cues  Function - Comprehension Comprehension: Auditory Comprehension assist level: Understands complex 90% of the time/cues 10% of the time  Function - Expression Expression: Verbal Expression assist level: Expresses complex 90% of the time/cues < 10% of the time  Function - Social Interaction Social Interaction assist level: Interacts appropriately 90% of the time - Needs monitoring or encouragement for participation or interaction.  Function - Problem Solving Problem solving assist level: Solves basic problems with no assist  Function - Memory Memory assist level: Recognizes or recalls 90% of the time/requires cueing < 10% of the time Patient normally able to recall (first 3 days only): Current season, Location of own room, Staff names and faces, That he or she is in a hospital  Medical Problem List and Plan: 1.  Right side weakness/dysphagia secondary to left frontal lobe infarct complicated by respiratory failure  Plan d/c today 2.  DVT Prophylaxis/Anticoagulation: SCDs. Monitor for any signs of DVT 3. Pain Management: Tylenol as needed 4. Dysphagia/esophageal stricture. Status post gastrostomy tube 06/14/2015 per interventional radiology. Speech therapy follow-up. Plan outpatient EGD endoscopy for dilatation of stricture, swallowing dysfunction improving when comparing the modified barium swallow performed 06/09/2015 versus 06/15/2015,-Intake 50% meals plus 2 ensure cans per day 5. Neuropsych: This patient  is capable of making decisions on her own behalf. 6. Skin/Wound Care: Routine skin checks, gastrostomy tube site looks okay. Continue daily normal saline rinses 7. Fluids/Electrolytes/Nutrition: Routine I&O's 8. ID. Strep pyogenes bacteremia. Followed by infectious disease. Continue ceftriaxone questions 6 week  f/u Dr. Drucilla Schmidt.  9. Atrial flutter with RVR. 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 Discuss resuming plaquenil and chronic prednisone.no active joint inflammation at the current time 11. Hyperglycemia secondary to chronic prednisone/tube feeds. Hemoglobin A1c 6.6.  12. Hyperlipidemia. Lipitor, patient has elevated liver function tests, reduced Lipitor from 40 mg to 20 mg and recheck C met  in 1-2 weeks 13. Hypothyroidism. Resume Synthroid 14. Elevated LFTs, had recent gallbladder ultrasound which was negative. Cholelithiasis but no clinical evidence of cholecystitis 15.  Hematuria- has UTI, ecoli and yeast,no recurrent episodes  LOS (Days) 14 A FACE TO FACE EVALUATION WAS PERFORMED  KIRSTEINS,ANDREW E 06/30/2015, 10:28 AM

## 2015-06-30 NOTE — Progress Notes (Signed)
RN went over PEG tube flushes,50 ml. 3 times a day.Pt. And her husband verbalized feeling comfortable about it.Pt. Got prescriptions,d/c instructions and follow appointments.Pt. Ready to go home.

## 2015-06-30 NOTE — Telephone Encounter (Signed)
[facility] called about Rx for protonix and lopressor being liquid.  Pt wants it turned to tablet form and [facility]'t [facility] liquid form

## 2015-07-02 ENCOUNTER — Telehealth: Payer: Self-pay

## 2015-07-02 NOTE — Telephone Encounter (Signed)
Correction for note below: Pt's husband will be calling Neurology to make an appointment. Has an appointment with her PCP set up.  Pt is not smoking, drinking alcohol or using drugs.

## 2015-07-02 NOTE — Telephone Encounter (Signed)
Pt was called to establish a follow up visit with [facility]. While setting up visit, pt's husband stated that they are having problems getting the lopressor suspension filled. It was originally filled as 2.5 ml BID and dispensed in a 10 ml tube, which would only last two days. Pharmacy would like to know if this was on purpose or if they can give the pt 150 ml tube for a month's worth of medicine. Called over to the PA office, could not get clarification. Please help and advise. Thanks!

## 2015-07-02 NOTE — Telephone Encounter (Signed)
The patient needs a 1 month supply of medication

## 2015-07-02 NOTE — Telephone Encounter (Signed)
1. Are you/is patient experiencing any problems since coming home? Are there any questions regarding any aspect of care? No 2. Are there any questions regarding medications administration/dosing? Are meds being taken as prescribed? Patient should review meds with caller to confirm. No questions regarding medications. Current list of meds matches with the pt's current regimen.  3. Have there been any falls? No 4. Has Home Health been to the house and/or have they contacted you? If not, have you tried to contact them? Can we help you contact them? Home Health RN has been visiting.  5. Are bowels and bladder emptying properly? Are there any unexpected incontinence issues? If applicable, is patient following bowel/bladder programs? No problems with bladder or bowels.  6. Any fevers, problems with breathing, unexpected pain? No 7. Are there any skin problems or new areas of breakdown? No 8. Has the patient/family member arranged specialty MD follow up (ie cardiology/neurology/renal/surgical/etc)? Can we help arrange? Has made an appointment with PCP.  9. Does the patient need any other services or support that we can help arrange? No 10. Are caregivers following through as expected in assisting the patient? Yes 11. Has the patient quit smoking, drinking alcohol, or using drugs as recommended? No

## 2015-07-02 NOTE — Telephone Encounter (Signed)
Randi with [facility] in [place] needs clarification on patients medication Metoprolol  Tartrate please call her at [phone]

## 2015-07-02 NOTE — Telephone Encounter (Signed)
1. Are you/is patient experiencing any problems since coming home? Are there any questions regarding any aspect of care? No  2. Are there any questions regarding medications administration/dosing? Are meds being taken as prescribed? Patient should review meds with caller to confirm. No questions regarding medications. Current list of meds matches with the pt's current regimen. Pharmacy has tried to get in touch in regards to Lopressor and how to  3. Have there been any falls? No 4. Has Home Health been to the house and/or have they contacted you? If not, have you tried to contact them? Can we help you contact them? [place] nurse 5. Are bowels and bladder emptying properly? Are there any unexpected incontinence issues? If applicable, is patient following bowel/bladder programs? No 6. Any fevers, problems with breathing, unexpected pain? No 7. Are there any skin problems or new areas of breakdown? No 8. Has the patient/family member arranged specialty MD follow up (ie cardiology/neurology/renal/surgical/etc)?  Can we help arrange? Primary  9. Does the patient need any other services or support that we can help arrange? No 10. Are caregivers following through as expected in assisting the patient? Yes 11. Has the patient quit smoking, drinking alcohol, or using drugs as recommended? No

## 2015-07-02 NOTE — Telephone Encounter (Signed)
Error in previous note.

## 2015-07-05 ENCOUNTER — Encounter: Payer: Self-pay | Admitting: Physical Therapy

## 2015-07-05 ENCOUNTER — Ambulatory Visit: Payer: BC Managed Care – PPO | Attending: Physical Medicine & Rehabilitation | Admitting: Occupational Therapy

## 2015-07-05 ENCOUNTER — Ambulatory Visit: Payer: BC Managed Care – PPO | Admitting: Physical Therapy

## 2015-07-05 DIAGNOSIS — I639 Cerebral infarction, unspecified: Secondary | ICD-10-CM | POA: Insufficient documentation

## 2015-07-05 DIAGNOSIS — R1310 Dysphagia, unspecified: Secondary | ICD-10-CM | POA: Insufficient documentation

## 2015-07-05 DIAGNOSIS — R531 Weakness: Secondary | ICD-10-CM | POA: Insufficient documentation

## 2015-07-05 DIAGNOSIS — R279 Unspecified lack of coordination: Secondary | ICD-10-CM | POA: Diagnosis not present

## 2015-07-05 DIAGNOSIS — R278 Other lack of coordination: Secondary | ICD-10-CM | POA: Insufficient documentation

## 2015-07-05 DIAGNOSIS — R1312 Dysphagia, oropharyngeal phase: Secondary | ICD-10-CM | POA: Diagnosis present

## 2015-07-05 DIAGNOSIS — M6281 Muscle weakness (generalized): Secondary | ICD-10-CM | POA: Diagnosis present

## 2015-07-05 NOTE — Therapy (Signed)
[place] MAIN [facility] SERVICES [address] [place], [place], [address] Phone: [phone]   Fax:  [phone]  Occupational Therapy Evaluation  Patient Details  Name: [name] [name] MRN: [id number] Date of Birth: 12-11-1955 Referring Provider: Read Drivers  Encounter Date: 07/05/2015      OT End of Session - 07/05/15 1603    Date for OT Re-Evaluation 10/03/15      Past Medical History  Diagnosis Date  . Hypothyroidism   . Seizures ([place])   . Adenopathy   . Scoliosis   . Spleen anomaly   . Anemia   . Sjogren's disease ([place])   . Raynaud disease   . Shingles   . Osteopenia     Past Surgical History  Procedure Laterality Date  . Tonsillectomy    . Breast cyst aspiration Right 1990's    neg    There were no vitals filed for this visit.  Visit Diagnosis:  Lack of coordination - Plan: Ot plan of care cert/re-cert  Weakness generalized - Plan: Ot plan of care cert/re-cert      Subjective Assessment - 07/05/15 1544    Subjective  Pt. reports that her whole illness episode started out with the flu and strep.   Patient is accompained by: Family member   Pertinent History Xiomari Liera is a 60 y.o.right handed female with history of Sjogrens disease/rheumatoid arthritis, noted history of seizure but not on antiepileptic meds and dysphagia. Patient diagnosed 4 days prior with an placed on Tamiflu. Lives with spouse independent prior to admission. 2 level home with bedroom on main level. 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 Presented 05/29/2015 with right side weakness and gait instability. Patient with severe restrictive airway distress when he presented to the ED with oxygen saturations in the 70s requiring nonrebreathing mask and later intubated with slow extubation follow-up per critical care pulmonary services. Chest x-ray showed large right lung consolidative changes. CT angiogram chest no evidence of pulmonary  emboli. Cranial CT scan negative. 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 Echocardiogram with ejection fraction of 65% no wall motion abnormalities. 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 EEG was negative for seizure. Patient did not receive TPA. Blood cultures showed group A strep pansensitive 05/30/2015 and currently maintained on Rocephin with droplet contact precautions   Patient Stated Goals To regain use of her right hand.   Currently in Pain? No/denies   Pain Score 0-No pain           [facility] OT Assessment - 07/05/15 1610    Assessment   Diagnosis CVA   Onset Date 05/29/15   Prior Therapy Inpatient Rehab   Precautions   Precautions None   Restrictions   Weight Bearing Restrictions No   Balance Screen   Has the patient fallen in the past 6 months No   Has the patient had a decrease in activity level because of a fear of falling?  No   Is the patient reluctant to leave their home because of a fear of falling?  No   Home  Environment   Family/patient expects to be discharged to: Private residence   Living Arrangements Spouse/significant other   Available Help at Discharge Friend(s)   Type of [place] Two level   Alternate Level Stairs - Number of Steps 3   Bathroom Building control surveyor;Door  Biochemist, clinical Yes   Home Equipment Bedside commode;Shower seat;Wheelchair - manual;Hand held shower head   Lives With Spouse   Prior Function   Level of Independence Independent with basic ADLs;Independent with gait;Independent with transfers   Vocation Full time employment   Leisure Kindergarden school teacher   ADL   Eating/Feeding Other (comment )   Eating/Feeding Patient Percentage --  Uses left hand to complete and Rhand for gross assist   Grooming Minimal assistance   Where Assess - Grooming Supported sitting   Upper Body Bathing Min  guard   Lower Body Bathing Minimal assistance   Upper Body Dressing Min guard   Upper Body Dressing Patient Percentage --  Mod A fastening bra   Lower Body Dressing Minimal assistance   Toilet [place]   Equipment Used Other (comment)   Transfers/Ambulation Related to ADL's shoe button for laces   ADL comments Pt. is unable to fasten bra.   IADL   Prior Level of Function Shopping Assist needed,    Shopping Needs to be accompanied on any shopping trip   Light Housekeeping Needs help with all home maintenance tasks   Prior Level of Function Meal Prep CGA with left hand   Meal Prep Able to complete simple cold meal and snack prep   Community Mobility Relies on family or friends for transportation   Medication Management Takes responsibility if medication is prepared in advance in seperate dosage   Financial Management --  Husband does monthly bills.   Vision - History   Baseline Vision Wears glasses for distance only   Activity Tolerance   Activity Tolerance Endurance does not limit participation in activity   Cognition   Attention Focused   Sensation   Light Touch Appears Intact   Proprioception Appears Intact   AROM   Overall AROM Comments Left UE ROM WFL: R UE shoulder flexion 128(142), Abduction 82(102), Elbow flexion/extension WFL, wrist extension 50 AROM, flexion 76, radial deviation 18 degrees, ulnar deviation 24 degrees, Digit flexion to [facility]: 2nd: 4cm, 3rd: 3cm, 4th: 2cm, 5th: 1.5 cm Active thumb extension to neutral, thumb opposition to 2nd digit   Strength   Overall Strength Comments Shoulder flexion abduction 3-/5, elbow flexion 3/5, extension 4-/5, wrist extension: 3+/5.    Hand Function   Right Hand Gross Grasp Impaired                         OT Education - 07/05/15 1554    Education provided Yes   Education Details Pt. ed was provided about OT services,  POC, goals.   Person(s) Educated Patient   Methods Explanation;Demonstration;Verbal cues   Comprehension Verbalized understanding             OT Long Term Goals - 07/05/15 1609    OT LONG TERM GOAL #1   Title Pt. will improve right shoulder flexion strength by 2 mm grades to assist with reaching into cabinetry.   Baseline Pt. is unable   Time 12   Period Weeks   Status New   OT LONG TERM GOAL #2   Title Pt. will improve right grip fisting with digit flexion to [facility] improved by 2 cm in preparation for grasping/holding ADL grooming and feeding items.   Baseline limited digit fisting flexion to [facility].   Time 12   Period Weeks   Status  New   OT LONG TERM GOAL #3   Title Pt. will improve right elbow flexion and extension strength by 2 mm grades to be able to apply deoderant and wash left axilla region.   Baseline Pt. requires assist to apply deoderant and wash left axilla.   Time 12   Period Weeks   Status New   OT LONG TERM GOAL #4   Title Pt. will improve right shoulder abduction by 10 degrees to assist with UE dressing.   Baseline Pt. requires assist   Time 12   Period Weeks   Status New   OT LONG TERM GOAL #5   Title Pt. will be independent with light meal prep.   Baseline Pt. requires assist   Time 12   Period Weeks   Status New               Plan - 07/05/15 1636    Clinical Impression Statement Pt. presents with impaired right shoulder ROM, strength, coordination, and hand function which hinders her ability to use it successfully and efficiently during ADL and IADL tasks. Pt. currently uses her right hand as a gross assist during BADL and IADL tasks. Pt. requires skilled OT services to work on and improve he right UE and hand use during ADLs and IADLs.    Pt will benefit from skilled therapeutic intervention in order to improve on the following deficits (Retired) Decreased balance;Decreased strength;Decreased coordination;Decreased activity tolerance;Decreased  range of motion;Impaired UE functional use;Decreased knowledge of use of DME;Decreased endurance;Impaired tone   Rehab Potential Good   OT Frequency 2x / week   OT Duration 12 weeks   OT Treatment/Interventions Self-care/ADL training;Electrical Stimulation;Moist Heat;Manual Therapy;DME and/or AE instruction;Therapeutic activities;Passive range of motion;Ultrasound;Therapeutic exercises;Patient/family education;Neuromuscular education;Therapeutic exercise;Visual/perceptual remediation/compensation;Balance training   Plan Plan to improve right hand ROM, strength, and functional hand use.   Consulted and Agree with Plan of Care Patient        Problem List Patient Active Problem List   Diagnosis Date Noted  . Acute lower UTI   . Ischemic stroke of frontal lobe ([place]) 06/16/2015  . Dysphagia as late effect of cerebrovascular disease   . Hemiparesis affecting dominant side as late effect of stroke ([place])   . Esophageal stricture   . Bacteremia   . Atrial flutter ([place])   . Rheumatoid arthritis ([place])   . Hyperglycemia   . Thyroid activity decreased   . Infection due to Streptococcus pyogenes 06/12/2015  . Severe sepsis with septic shock ([place]) 06/12/2015  . Hematuria 06/12/2015  . Dysphagia 06/12/2015  . 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 CVA (cerebral infarction)   . Acute respiratory failure ([place])   . Encounter for diagnostic procedure   . Ventilator dependence ([place])   . Carotid thromboses   . HLD (hyperlipidemia)   . Respiratory failure ([place])   . Stroke (cerebrum) ([place])   . Research study patient   . Endotracheal tube present   . Endotracheally intubated   . Encounter for feeding tube placement   . Altered mental state   . Weakness   . Sepsis ([place]) 05/30/2015  . CAP (community acquired pneumonia)   . Encephalopathy   . Encounter for central line placement   . Hypoxia   . Lactic acidosis    Harrel Carina, MS, OTR/L  Harrel Carina 07/05/2015, 4:48 PM  [place] MAIN [facility] SERVICES [address] [place], [place], [address] Phone: [phone]   Fax:  [phone]  Name:  [name] [name] MRN: [id number] Date of Birth: 1956-02-23

## 2015-07-05 NOTE — Therapy (Signed)
[place] MAIN [facility] SERVICES [address] [place], [place], [address] Phone: [phone]   Fax:  [phone]  Physical Therapy Evaluation  Patient Details  Name: [name] [name] MRN: [id number] Date of Birth: December 07, 1955 Referring Provider: Charlett Blake  Encounter Date: 07/05/2015      PT End of Session - 07/05/15 1555    Visit Number 1   Number of Visits 8   Date for PT Re-Evaluation Aug 27, 2015   Authorization Type g codes   PT Start Time 0315   PT Stop Time 0410   PT Time Calculation (min) 55 min   Equipment Utilized During Treatment Gait belt   Activity Tolerance Patient tolerated treatment well;Patient limited by fatigue      Past Medical History  Diagnosis Date  . Hypothyroidism   . Seizures ([place])   . Adenopathy   . Scoliosis   . Spleen anomaly   . Anemia   . Sjogren's disease ([place])   . Raynaud disease   . Shingles   . Osteopenia     Past Surgical History  Procedure Laterality Date  . Tonsillectomy    . Breast cyst aspiration Right 1990's    neg    There were no vitals filed for this visit.  Visit Diagnosis:  Ischemic stroke of frontal lobe [facility])      Subjective Assessment - 07/05/15 1521    Subjective Patient is not concerned about her walking or balance , mostly just her RUE.   Currently in Pain? No/denies            [facility] PT Assessment - 07/05/15 1524    Assessment   Medical Diagnosis cva   Referring Provider Alysia Penna E   Onset Date/Surgical Date 05/29/15   Hand Dominance Right   Next MD Visit 07/06/15   Prior Therapy PT in hospital   Precautions   Precautions None   Restrictions   Weight Bearing Restrictions No   Balance Screen   Has the patient fallen in the past 6 months No   Has the patient had a decrease in activity level because of a fear of falling?  Yes   Is the patient reluctant to leave their home because of a fear of falling?  No   Home Environment   Living Environment  Private residence   Living Arrangements Spouse/significant other   Available Help at Discharge Family   Type of [place] to enter   Entrance Stairs-Number of Steps 2   Entrance Stairs-Rails Right;Left   Home Layout Two level   Alternate Level Stairs-Number of Steps 12  she does not need to go upstairs   Alternate Level Stairs-Rails Right   [place] - single point;Grab bars - toilet;Shower seat   Prior Function   Level of Independence Independent with basic ADLs;Independent with gait;Independent with transfers   Vocation Full time employment   Leisure Kindergarden school teacher   Cognition   Overall Cognitive Status Within Functional Limits for tasks assessed   Attention Focused    PAIN: No reports of pain   POSTURE: WNL   PROM/AROM: WNL  STRENGTH:  Graded on a 0-5 scale Muscle Group Left Right  Shoulder flex 5   Shoulder Abd 5   Shoulder Ext 5   Shoulder IR/ER 5   Elbow 5   Wrist/hand 5   Hip Flex 5 5  Hip Abd 5 4  Hip Add 5 4  Hip Ext 4 4  Hip IR/ER 5 5  Knee Flex 5 5  Knee Ext 5 5  Ankle DF 5 5  Ankle PF 5 5   SENSATION: WNL      FUNCTIONAL MOBILITY: Independent mobiity   BALANCE: WNL 56/56 Berg test   GAIT: decreased RUE swing, no AD, good step length and step height equal  OUTCOME MEASURES: TEST Outcome Interpretation  5 times sit<>stand 11. 83sec >60 yo, >15 sec indicates increased risk for falls  10 meter walk test      1.19           m/s <1.0 m/s indicates increased risk for falls; limited community ambulator  Timed up and Go   8.97              sec <14 sec indicates increased risk for falls  6 minute walk test  800            Feet 1000 feet is community Water quality scientist 56/56 <36/56 (100% risk for falls), 37-45 (80% risk for falls); 46-51 (>50% risk for falls); 52-55 (lower risk <25% of falls)                                PT Education - 29-Jul-2015 1554    Education  provided Yes   Education Details HEP   Person(s) Educated Patient   Methods Explanation   Comprehension Verbalized understanding             PT Long Term Goals - Jul 29, 2015 1613    PT LONG TERM GOAL #1   Title Patient will be independent in home exercise program to improve strength/mobility for better functional independence with ADLs   Time 1   Period Months   Status New   PT LONG TERM GOAL #2   Title Patient will increase six minute walk test distance to >1000 for progression to community ambulator and improve gait ability   Time 1   Period Months   Status New               Plan - July 29, 2015 1610    Clinical Impression Statement Patient is 60 yr old female with decreased strength RLE hip, decreased 6 MW test  800 feet and decreased RUE strength.    Pt will benefit from skilled therapeutic intervention in order to improve on the following deficits Decreased activity tolerance;Decreased endurance;Decreased strength   Rehab Potential Excellent   PT Frequency 2x / week   PT Duration 4 weeks   PT Treatment/Interventions Therapeutic exercise;Gait training;Therapeutic activities   PT Next Visit Plan HEP progression and strengthenign   Consulted and Agree with Plan of Care Patient          G-Codes - 07-29-15 1607    Functional Assessment Tool Used 5 x sit to stand, 10 MW, TUG, 6 MW   Functional Limitation Mobility: Walking and moving around   Mobility: Walking and Moving Around Current Status [phone]) At least 1 percent but less than 20 percent impaired, limited or restricted   Mobility: Walking and Moving Around Goal Status [phone]) 0 percent impaired, limited or restricted       Problem List Patient Active Problem List   Diagnosis Date Noted  . Acute lower UTI   . Ischemic stroke of frontal lobe ([place]) 06/16/2015  . Dysphagia as late effect of cerebrovascular disease   . Hemiparesis affecting dominant side as late effect of stroke ([place])   .  Esophageal stricture    . Bacteremia   . Atrial flutter ([place])   . Rheumatoid arthritis ([place])   . Hyperglycemia   . Thyroid activity decreased   . Infection due to Streptococcus pyogenes 06/12/2015  . Severe sepsis with septic shock ([place]) 06/12/2015  . Hematuria 06/12/2015  . Dysphagia 06/12/2015  . 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 CVA (cerebral infarction)   . Acute respiratory failure ([place])   . Encounter for diagnostic procedure   . Ventilator dependence ([place])   . Carotid thromboses   . HLD (hyperlipidemia)   . Respiratory failure ([place])   . Stroke (cerebrum) ([place])   . Research study patient   . Endotracheal tube present   . Endotracheally intubated   . Encounter for feeding tube placement   . Altered mental state   . Weakness   . Sepsis ([place]) 05/30/2015  . CAP (community acquired pneumonia)   . Encephalopathy   . Encounter for central line placement   . Hypoxia   . Lactic acidosis   Alanson Puls, PT, DPT  [place], [place] S 07/05/2015, 4:15 PM  [place] MAIN [facility] SERVICES [address] [place], [place], [address] Phone: [phone]   Fax:  [phone]  Name: [name] [name] MRN: [id number] Date of Birth: Nov 28, 1955

## 2015-07-05 NOTE — Telephone Encounter (Signed)
1 month of Lopressor suspension called into [facility].

## 2015-07-07 ENCOUNTER — Other Ambulatory Visit (HOSPITAL_COMMUNITY): Payer: Self-pay | Admitting: Family Medicine

## 2015-07-07 ENCOUNTER — Ambulatory Visit: Payer: BC Managed Care – PPO | Admitting: Speech Pathology

## 2015-07-07 ENCOUNTER — Ambulatory Visit: Payer: BC Managed Care – PPO | Admitting: Physical Therapy

## 2015-07-07 ENCOUNTER — Encounter: Payer: Self-pay | Admitting: Physical Therapy

## 2015-07-07 DIAGNOSIS — R279 Unspecified lack of coordination: Secondary | ICD-10-CM | POA: Diagnosis not present

## 2015-07-07 DIAGNOSIS — R1312 Dysphagia, oropharyngeal phase: Secondary | ICD-10-CM

## 2015-07-07 DIAGNOSIS — R1310 Dysphagia, unspecified: Secondary | ICD-10-CM

## 2015-07-07 DIAGNOSIS — R531 Weakness: Secondary | ICD-10-CM

## 2015-07-07 DIAGNOSIS — I639 Cerebral infarction, unspecified: Secondary | ICD-10-CM

## 2015-07-07 NOTE — Therapy (Signed)
[place] MAIN [facility] SERVICES [address] [place], [place], [address] Phone: [phone]   Fax:  [phone]  Physical Therapy Treatment  Patient Details  Name: [name] [name] MRN: [id number] Date of Birth: 09-15-1955 Referring Provider: Charlett Blake  Encounter Date: 07/07/2015      PT End of Session - 07/07/15 1510    Visit Number 2   Number of Visits 8   Date for PT Re-Evaluation 2015-08-22   Authorization Type g codes   PT Start Time 0300   PT Stop Time 0345   PT Time Calculation (min) 45 min   Equipment Utilized During Treatment Gait belt   Activity Tolerance Patient tolerated treatment well;Patient limited by fatigue      Past Medical History  Diagnosis Date  . Hypothyroidism   . Seizures ([place])   . Adenopathy   . Scoliosis   . Spleen anomaly   . Anemia   . Sjogren's disease ([place])   . Raynaud disease   . Shingles   . Osteopenia     Past Surgical History  Procedure Laterality Date  . Tonsillectomy    . Breast cyst aspiration Right 1990's    neg    There were no vitals filed for this visit.  Visit Diagnosis:  Ischemic stroke of frontal lobe ([place])  Lack of coordination  Weakness generalized      Subjective Assessment - 07/07/15 1509    Subjective Patient has BP 103/57 today , last night it dropped to 63/35   Currently in Pain? No/denies      Therapeutic exercise:  standing hip abd, hip extension with YTB x 20  side stepping left and right in parallel bars 10 feet x 3 step ups from floor to 6 inch stool x 20 bilateral sit to stand x 10 TM x 1. 2 miles / hour x 10 mintues Leg press 90 lbs x 20 x 3, ankle PF x 20 x 3 Patient needs occasional verbal cueing to improve posture and cueing to correctly perform exercises slowly, holding at end of range to increase motor firing of desired muscle to encourage fatigue.                             PT Education - 07/07/15 1510    Education  provided Yes   Education Details HEP   Person(s) Educated Patient   Methods Explanation   Comprehension Verbalized understanding             PT Long Term Goals - 07/05/15 1613    PT LONG TERM GOAL #1   Title Patient will be independent in home exercise program to improve strength/mobility for better functional independence with ADLs   Time 1   Period Months   Status New   PT LONG TERM GOAL #2   Title Patient will increase six minute walk test distance to >1000 for progression to community ambulator and improve gait ability   Time 1   Period Months   Status New               Plan - 07/07/15 1510    Clinical Impression Statement Patient was able to perform LE exercises and TM walking to improve gait.    Pt will benefit from skilled therapeutic intervention in order to improve on the following deficits Decreased activity tolerance;Decreased endurance;Decreased strength   Rehab Potential Excellent   PT Frequency 2x / week  PT Duration 4 weeks   PT Treatment/Interventions Therapeutic exercise;Gait training;Therapeutic activities   PT Next Visit Plan HEP progression and strengthenign   Consulted and Agree with Plan of Care Patient        Problem List Patient Active Problem List   Diagnosis Date Noted  . Acute lower UTI   . Ischemic stroke of frontal lobe ([place]) 06/16/2015  . Dysphagia as late effect of cerebrovascular disease   . Hemiparesis affecting dominant side as late effect of stroke ([place])   . Esophageal stricture   . Bacteremia   . Atrial flutter ([place])   . Rheumatoid arthritis ([place])   . Hyperglycemia   . Thyroid activity decreased   . Infection due to Streptococcus pyogenes 06/12/2015  . Severe sepsis with septic shock ([place]) 06/12/2015  . Hematuria 06/12/2015  . Dysphagia 06/12/2015  . 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 CVA (cerebral infarction)   . Acute respiratory failure ([place])   . Encounter for diagnostic procedure   . Ventilator dependence  ([place])   . Carotid thromboses   . HLD (hyperlipidemia)   . Respiratory failure ([place])   . Stroke (cerebrum) ([place])   . Research study patient   . Endotracheal tube present   . Endotracheally intubated   . Encounter for feeding tube placement   . Altered mental state   . Weakness   . Sepsis ([place]) 05/30/2015  . CAP (community acquired pneumonia)   . Encephalopathy   . Encounter for central line placement   . Hypoxia   . Lactic acidosis   [name] [name], PT, DPT  [place], [place] S 07/07/2015, 3:12 PM  [place] MAIN [facility] SERVICES [address] [place], [place], [address] Phone: [phone]   Fax:  [phone]  Name: [name] [name] MRN: [id number] Date of Birth: 1956-02-17

## 2015-07-08 ENCOUNTER — Encounter: Payer: Self-pay | Admitting: Speech Pathology

## 2015-07-08 ENCOUNTER — Encounter: Payer: Self-pay | Admitting: Physical Therapy

## 2015-07-08 NOTE — Therapy (Signed)
[place] MAIN [facility] SERVICES [address] [place], [place], [address] Phone: [phone]   Fax:  [phone]  Speech Language Pathology Evaluation  Patient Details  Name: [name] [name] MRN: [id number] Date of Birth: 01-28-1956 Referring Provider: Alysia Penna MD  Encounter Date: 07/07/2015      End of Session - 07/08/15 0857    Visit Number 1   SLP Start Time [id number]   SLP Stop Time  1450   SLP Time Calculation (min) 40 min   Activity Tolerance Patient tolerated treatment well      Past Medical History  Diagnosis Date  . Hypothyroidism   . Seizures ([place])   . Adenopathy   . Scoliosis   . Spleen anomaly   . Anemia   . Sjogren's disease ([place])   . Raynaud disease   . Shingles   . Osteopenia     Past Surgical History  Procedure Laterality Date  . Tonsillectomy    . Breast cyst aspiration Right 1990's    neg    There were no vitals filed for this visit.  Visit Diagnosis: Dysphagia  Oropharyngeal dysphagia      Subjective Assessment - 07/08/15 0856    Subjective Patient reports she has no cognitive, speech, language, or swallowing concerns. Five weeks post-onset of left MCA she reports that "things are going well" and that all her speech, language, cognitive, and swallowing concerns have resolved. Her only major concern revolves around weakness in her right arm for which she is seeking PT and OT services at [facility].   Currently in Pain? No/denies            SLP Evaluation [facility] - 07/08/15 0001    SLP Visit Information   SLP Received On 07/07/15   Referring Provider Alysia Penna MD   Onset Date 06/02/2015   Medical Diagnosis CVA   Subjective   Subjective Patient reports she has no cognitive, speech, language, or swallowing concerns. Five weeks post-onset of left MCA she reports that "things are going well" and that all her speech, language, cognitive, and swallowing concerns have resolved. Her only major concern  revolves around weakness in her right arm for which she is seeking PT and OT services at [facility].   Prior Functional Status   Cognitive/Linguistic Baseline Within functional limits   Oral Motor/Sensory Function   Overall Oral Motor/Sensory Function Appears within functional limits for tasks assessed   Motor Speech   Overall Motor Speech Appears within functional limits for tasks assessed   Standardized Assessments   Standardized Assessments  Other Assessment   Other Assessment Bedside Swallow                         SLP Education - 07/08/15 0856    Education provided Yes   Education Details strengthening exercises for swallow   Person(s) Educated Patient   Methods Explanation;Demonstration;Handout   Comprehension Verbalized understanding;Returned demonstration              Plan - 07/08/15 1206    Clinical Impression Statement At 5 weeks post onset of left CVA, this 60 year old female is presenting with mild oropharyngeal dysphagia. She received inpatient speech-language pathology services that focused on swallowing rehabilitation from 06/16/2015 - 06/30/2015 and had her last MBSS on 06/29/2015 and was discharged with recommendations for a dysphagia III diet (thin liquids with precautions). Patient was also encouraged to do strengthening exercises for the swallow Caryl Ada and [place]) that she  has been doing 3 times a day, every day. Ms. Glackin noted that she has not experienced difficulty with mastication, bolus control, or the oropharyngeal swallow over the past week. Based on interview and observation of regular consistency and thin liquid swallows, clinical judgement confirmed the patient's self-assessment, and a regular diet using strategies recommended by the inpatient SLP with strengthening exercises was recommended. The patient was directed to perform swallowing exercises 3 times a day, every day that included the Masako, Mendelsohn, Shaker, and tongue resistance  exercises. Other dietary recommendations were made that included using heavy dressing on salads in order to improve bolus control. Based on clinical judgement and consultation with patient, speech language pathology services are not indicated at this time. Ms. Brightwell commented that her primary concerns related to weakness in her right arm and is currently undergoing treatment with PT and OT at [facility]. Ms. Didonato was given our contact information for any future concerns related to swallowing.        Problem List Patient Active Problem List   Diagnosis Date Noted  . Acute lower UTI   . Ischemic stroke of frontal lobe ([place]) 06/16/2015  . Dysphagia as late effect of cerebrovascular disease   . Hemiparesis affecting dominant side as late effect of stroke ([place])   . Esophageal stricture   . Bacteremia   . Atrial flutter ([place])   . Rheumatoid arthritis ([place])   . Hyperglycemia   . Thyroid activity decreased   . Infection due to Streptococcus pyogenes 06/12/2015  . Severe sepsis with septic shock ([place]) 06/12/2015  . Hematuria 06/12/2015  . Dysphagia 06/12/2015  . 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 CVA (cerebral infarction)   . Acute respiratory failure ([place])   . Encounter for diagnostic procedure   . Ventilator dependence ([place])   . Carotid thromboses   . HLD (hyperlipidemia)   . Respiratory failure ([place])   . Stroke (cerebrum) ([place])   . Research study patient   . Endotracheal tube present   . Endotracheally intubated   . Encounter for feeding tube placement   . Altered mental state   . Weakness   . Sepsis ([place]) 05/30/2015  . CAP (community acquired pneumonia)   . Encephalopathy   . Encounter for central line placement   . Hypoxia   . Lactic acidosis     Wynelle Cleveland 07/08/2015, 12:08 PM  [place] MAIN [facility] SERVICES [address] [place], [place], [address] Phone: [phone]   Fax:  [phone]  Name: [name] [name] MRN: [id number] Date of  Birth: 08/14/1955

## 2015-07-12 ENCOUNTER — Encounter: Payer: BC Managed Care – PPO | Attending: Physical Medicine & Rehabilitation

## 2015-07-12 ENCOUNTER — Encounter: Payer: Self-pay | Admitting: Physical Medicine & Rehabilitation

## 2015-07-12 ENCOUNTER — Ambulatory Visit (HOSPITAL_BASED_OUTPATIENT_CLINIC_OR_DEPARTMENT_OTHER): Payer: BC Managed Care – PPO | Admitting: Physical Medicine & Rehabilitation

## 2015-07-12 VITALS — BP 106/50 | HR 91 | Resp 14

## 2015-07-12 DIAGNOSIS — M8580 Other specified disorders of bone density and structure, unspecified site: Secondary | ICD-10-CM | POA: Insufficient documentation

## 2015-07-12 DIAGNOSIS — R531 Weakness: Secondary | ICD-10-CM | POA: Diagnosis present

## 2015-07-12 DIAGNOSIS — M3500 Sicca syndrome, unspecified: Secondary | ICD-10-CM | POA: Diagnosis not present

## 2015-07-12 DIAGNOSIS — I638 Other cerebral infarction: Secondary | ICD-10-CM | POA: Diagnosis present

## 2015-07-12 DIAGNOSIS — I7300 Raynaud's syndrome without gangrene: Secondary | ICD-10-CM | POA: Diagnosis not present

## 2015-07-12 DIAGNOSIS — E785 Hyperlipidemia, unspecified: Secondary | ICD-10-CM | POA: Diagnosis not present

## 2015-07-12 DIAGNOSIS — I4892 Unspecified atrial flutter: Secondary | ICD-10-CM | POA: Diagnosis not present

## 2015-07-12 DIAGNOSIS — I69359 Hemiplegia and hemiparesis following cerebral infarction affecting unspecified side: Secondary | ICD-10-CM

## 2015-07-12 DIAGNOSIS — E039 Hypothyroidism, unspecified: Secondary | ICD-10-CM | POA: Diagnosis not present

## 2015-07-12 DIAGNOSIS — R1310 Dysphagia, unspecified: Secondary | ICD-10-CM | POA: Diagnosis not present

## 2015-07-12 DIAGNOSIS — M069 Rheumatoid arthritis, unspecified: Secondary | ICD-10-CM | POA: Diagnosis not present

## 2015-07-12 DIAGNOSIS — B998 Other infectious disease: Secondary | ICD-10-CM | POA: Diagnosis not present

## 2015-07-12 DIAGNOSIS — R569 Unspecified convulsions: Secondary | ICD-10-CM | POA: Diagnosis not present

## 2015-07-12 DIAGNOSIS — Z9889 Other specified postprocedural states: Secondary | ICD-10-CM | POA: Insufficient documentation

## 2015-07-12 DIAGNOSIS — K222 Esophageal obstruction: Secondary | ICD-10-CM | POA: Insufficient documentation

## 2015-07-12 NOTE — Progress Notes (Signed)
Subjective:  Transition of care From acute inpatient rehabilitation to home  Patient ID: [name] [name], female    DOB: 1956-02-01, 60 y.o.   MRN: [id number] DATE OF ADMISSION:  06/16/2015 DATE OF DISCHARGE: 06/30/2015 59 year old right-handed female with history of Sjogren disease with rheumatoid arthritis.  Recently diagnosed prior with flu, placed on Tamiflu.  She was independent prior to admission, living with her husband.  Presented on May 29, 2015, right-sided weakness and gait instability.  The patient with severe restrictive airway distress when presented to the ED with oxygen saturations in the 70s, requiring non-rebreather mask, later intubated, slow extubation, followup per Critical Care Pulmonary Services.  Chest x- ray showed large right consolidative changes.  CT angiogram of the chest, no evidence of pulmonary emboli.  Cranial CT scan negative.  MRI of the brain showed a small left frontal lobe infarct.  Echocardiogram with ejection fraction of 65%.  No wall motion abnormalities.  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  EEG negative for seizure.  The patient did not receive tPA. Blood culture showed group A strep pansensitive, May 30, 2015, placed on Rocephin.  Hospital course, atrial flutter.  Placed on Lovenox.  The patient asymptomatic.  Low-dose beta-blocker added. Modified barium swallow, June 09, 2015, showed severe pharyngeal dysphagia as well as esophageal stricture.  Underwent gastrostomy tube placement, Interventional Radiology, June 14, 2015.  Plavix and aspirin were resumed after PEG tube placed for CVA prophylaxis. 60 year old right-handed female with history of Sjogren disease with rheumatoid arthritis.  Recently diagnosed prior with flu, placed on Tamiflu.  She was independent prior to admission, living with her husband.  Presented on May 29, 2015, right-sided weakness and gait instability.   The patient with severe restrictive airway distress when presented to the ED with oxygen saturations in the 70s, requiring non-rebreather mask, later intubated, slow extubation, followup per Critical Care Pulmonary Services.  Chest x- ray showed large right consolidative changes.  CT angiogram of the chest, no evidence of pulmonary emboli.  Cranial CT scan negative.  MRI of the brain showed a small left frontal lobe infarct.  Echocardiogram with ejection fraction of 65%.  No wall motion abnormalities.  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  EEG negative for seizure.  The patient did not receive tPA. Blood culture showed group A strep pansensitive, May 30, 2015, placed on Rocephin.  Hospital course, atrial flutter.  Placed on Lovenox.  The patient asymptomatic.  Low-dose beta-blocker added. Modified barium swallow, June 09, 2015, showed severe pharyngeal dysphagia as well as esophageal stricture.  Underwent gastrostomy tube placement, Interventional Radiology, June 14, 2015.  Plavix and aspirin were resumed after PEG tube placed for CVA prophylaxis.  HPI Pt had home health for a week then changed to OP  Patient has had speech therapy and has been upgraded to a normal diet Patient has an appointment to discontinue PEG tube in 2 weeks Husband states that the patient is able to swallow pills. They are not using the tube at all. Patient still has occasionally poor appetite and will take ensure supplement by mouth  Working on endurance with PT, Merrilee Jansky now 54 Pain Inventory Average Pain 0 Pain Right Now 0 My pain is NA  In the last 24 hours, has pain interfered with the following? General activity 0 Relation with others 0 Enjoyment of life 0 What TIME of day is your pain at its  worst? NA Sleep (in general) NA  Pain is worse with: NA Pain improves with: NA Relief from Meds: NA  Mobility use a cane how many minutes can you  walk? 20 Do you have any goals in this area?  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  no  Physicians involved in your care Primary care Dr. Beverely Low   Family History  Problem Relation Age of Onset  . Breast cancer Maternal Grandfather 39   Social History   Social History  . Marital Status: Married    Spouse Name: N/A  . Number of Children: N/A  . Years of Education: N/A   Social History Main Topics  . Smoking status: Never Smoker   . Smokeless tobacco: Never Used  . Alcohol Use: No  . Drug Use: No  . Sexual Activity: Not Asked   Other Topics Concern  . None   Social History Narrative   Past Surgical History  Procedure Laterality Date  . Tonsillectomy    . Breast cyst aspiration Right 1990's    neg   Past Medical History  Diagnosis Date  . Hypothyroidism   . Seizures ([place])   . Adenopathy   . Scoliosis   . Spleen anomaly   . Anemia   . Sjogren's disease ([place])   . Raynaud disease   . Shingles   . Osteopenia    BP 106/50 mmHg  Pulse 91  Resp 14  SpO2 100%  Opioid Risk Score:   Fall Risk Score:  `1  Depression screen PHQ 2/9  Depression screen PHQ 2/9 07/12/2015  Decreased Interest 0  Down, Depressed, Hopeless 0  PHQ - 2 Score 0     Review of Systems  Respiratory: Positive for cough.   Musculoskeletal: Positive for gait problem.  Neurological: Positive for tremors and weakness.  Psychiatric/Behavioral: Positive for confusion. The patient is nervous/anxious.   All other systems reviewed and are negative.      Objective:   Physical Exam  Constitutional: She is oriented to person, place, and time. She appears well-developed and well-nourished.  HENT:  Head: Normocephalic and atraumatic.  Eyes: Conjunctivae are  normal. Pupils are equal, round, and reactive to light.  Neck: Normal range of motion.  Cardiovascular: Normal rate and regular rhythm.   Pulmonary/Chest: Effort normal and breath sounds normal.  Abdominal: Soft. Bowel sounds are normal.  PEG site intact  Neurological: She is alert and oriented to person, place, and time. She has normal reflexes.  Motor strength is 3/5 in the right deltoid, bicep, tricep 2 minus at the finger flexors 5/5 in left deltoid, biceps, triceps, grip 4/5 right hip flexor 5/5 right knee extensor and ankle dorsiflexor plantar flexor 5/5 in left hip flexor knee extensor and ankle dorsiflexor and plantar flexor  Psychiatric: She has a normal mood and affect.  Nursing note and vitals reviewed.         Assessment & Plan:  Medical Problem List and Plan: 1.  Right side weakness/dysphagia secondary to left frontal lobe infarct complicated by respiratory failure Discussed work activities , We discussed taking temporary disability and then reassessing once patient plateaus from a physical and cognitive standpoint, this was discussed at length patient and her husband  2. Dysphagia/esophageal stricture. Status post gastrostomy tube 06/14/2015 per interventional radiology. Speech therapy follow-up. Plan outpatient GI follow up                                       3. Neuropsych: This patient is capable of making decisions on her own behalf.Memory mildly impaired 4. Skin/Wound Care: Routine skin checks 5. Fluids/Electrolytes/Nutrition:Good intake off TF 6. ID. Strep pyogenes bacteremia. Followed by infectious disease. Completed ceftriaxone  6 week course  7. Atrial flutter with RVR. Lopressor 12.5 mg twice a day, Follow-up with cardiology had episode of hypotension  8. Hyperlipidemia. Lipitor  We'll continue outpatient OT as well as PT. We discussed that it is safe for her to do moderate aerobic activity. She had an echocardiogram which had a normal  ejection fraction and no other serious abnormalities.

## 2015-07-12 NOTE — Patient Instructions (Signed)
As we have discussed, stroke recovery is gradual process. Physical recovery usually plateaus between 3 and 6 months Mental recovery usually plateaus between 9 and 12 months.  May gradually start leaving the patient home alone starting at 1-2 hours at a time and then working up to 8-9 hours

## 2015-07-13 ENCOUNTER — Ambulatory Visit: Payer: BC Managed Care – PPO | Admitting: Occupational Therapy

## 2015-07-13 DIAGNOSIS — I69351 Hemiplegia and hemiparesis following cerebral infarction affecting right dominant side: Secondary | ICD-10-CM | POA: Diagnosis not present

## 2015-07-13 DIAGNOSIS — I69291 Dysphagia following other nontraumatic intracranial hemorrhage: Secondary | ICD-10-CM | POA: Diagnosis not present

## 2015-07-13 DIAGNOSIS — M6281 Muscle weakness (generalized): Secondary | ICD-10-CM

## 2015-07-13 DIAGNOSIS — R279 Unspecified lack of coordination: Secondary | ICD-10-CM | POA: Diagnosis not present

## 2015-07-13 DIAGNOSIS — R278 Other lack of coordination: Secondary | ICD-10-CM

## 2015-07-13 DIAGNOSIS — Z452 Encounter for adjustment and management of vascular access device: Secondary | ICD-10-CM | POA: Diagnosis not present

## 2015-07-13 DIAGNOSIS — B954 Other streptococcus as the cause of diseases classified elsewhere: Secondary | ICD-10-CM | POA: Diagnosis not present

## 2015-07-13 NOTE — Therapy (Signed)
[place] MAIN [facility] SERVICES [address] [place], [place], [address] Phone: [phone]   Fax:  [phone]  Occupational Therapy Treatment  Patient Details  Name: [name] [name] MRN: [id number] Date of Birth: 1955-08-24 Referring Provider: Read Drivers  Encounter Date: 07/13/2015      OT End of Session - 07/13/15 1202    Visit Number 2   Number of Visits 30   Date for OT Re-Evaluation 10/03/15   Authorization Type 30 visit limit   OT Start Time 1113   OT Stop Time 1151   OT Time Calculation (min) 38 min   Activity Tolerance Patient tolerated treatment well   Behavior During Therapy [facility] for tasks assessed/performed      Past Medical History  Diagnosis Date  . Hypothyroidism   . Seizures ([place])   . Adenopathy   . Scoliosis   . Spleen anomaly   . Anemia   . Sjogren's disease ([place])   . Raynaud disease   . Shingles   . Osteopenia     Past Surgical History  Procedure Laterality Date  . Tonsillectomy    . Breast cyst aspiration Right 1990's    neg    There were no vitals filed for this visit.  Visit Diagnosis:  Muscle weakness (generalized)  Other lack of coordination      Subjective Assessment - 07/13/15 1159    Subjective  Pt. reports having had the PICC line taken out this week. Pt. went to the Cardiologist today, and now is wearing a holter cardiac monitor   Patient is accompained by: Family member   Pertinent History [name] [name] is a 60 y.o.right handed female with history of Sjogrens disease/rheumatoid arthritis, noted history of seizure but not on antiepileptic meds and dysphagia. Patient diagnosed 4 days prior with an placed on Tamiflu. Lives with spouse independent prior to admission. 2 level home with bedroom on main level. 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 Presented 05/29/2015 with right side weakness and gait instability. Patient with severe restrictive airway distress when he presented to  the ED with oxygen saturations in the 70s requiring nonrebreathing mask and later intubated with slow extubation follow-up per critical care pulmonary services. Chest x-ray showed large right lung consolidative changes. CT angiogram chest no evidence of pulmonary emboli. Cranial CT scan negative. 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 Echocardiogram with ejection fraction of 65% no wall motion abnormalities. 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 EEG was negative for seizure. Patient did not receive TPA. Blood cultures showed group A strep pansensitive 05/30/2015 and currently maintained on Rocephin with droplet contact precautions   Patient Stated Goals To regain use of her right hand.   Currently in Pain? No/denies   Pain Score 0-No pain      Neuro muscular re-ed  Pt. performed reaching tasks crossing midline with weightbearing through RUE.  Pt. requires assist to maintain weightbearing through RUE and hand while reaching with LUE. Pt. also performed reaching tasks with the right UE to prepare UE for ROM, and functional hand use. Pt. Worked on grasping 1"objects using her thumb and 2nd digit. Wrist extension was encouraged when removing the object. Isolating the 2nd digit was encouraged when placing the cubes down. Position of 2nd digit and thumb was emphasized. Pt. unable to complete when resistance was placed on the object.  There ex.  AROM of right shoulder. Pt. performed  elbow flexion/extension with 1# cuff weight. Pt. held onto the cuff weight for supination/pronation, and wrist extension. Pt. worked on lateral pinch strength and grip strengthening with gripper, and pink theraputty.                            OT Education - 07/13/15 1201    Education provided Yes   Education Details RUE weightbearing ex, and UE exercises.   Person(s) Educated Patient   Methods Explanation;Demonstration    Comprehension Verbalized understanding;Returned demonstration             OT Long Term Goals - 07/05/15 1609    OT LONG TERM GOAL #1   Title Pt. will improve right shoulder flexion strength by 2 mm grades to assist with reaching into cabinetry.   Baseline Pt. is unable   Time 12   Period Weeks   Status New   OT LONG TERM GOAL #2   Title Pt. will improve right grip fisting with digit flexion to [facility] improved by 2 cm in preparation for grasping/holding ADL grooming and feeding items.   Baseline limited digit fisting flexion to [facility].   Time 12   Period Weeks   Status New   OT LONG TERM GOAL #3   Title Pt. will improve right elbow flexion and extension strength by 2 mm grades to be able to apply deoderant and wash left axilla region.   Baseline Pt. requires assist to apply deoderant and wash left axilla.   Time 12   Period Weeks   Status New   OT LONG TERM GOAL #4   Title Pt. will improve right shoulder abduction by 10 degrees to assist with UE dressing.   Baseline Pt. requires assist   Time 12   Period Weeks   Status New   OT LONG TERM GOAL #5   Title Pt. will be independent with light meal prep.   Baseline Pt. requires assist   Time 12   Period Weeks   Status New               Plan - 07/13/15 1203    Clinical Impression Statement Pt. is improving with RUE ROM. Pt. continues to present with weakness in the RUE, grip strength, impaired hand function and coordination which hinders her sbility to use it functionally for ADL and IADL tasks.    Pt will benefit from skilled therapeutic intervention in order to improve on the following deficits (Retired) Decreased balance;Decreased strength;Decreased coordination;Decreased activity tolerance;Decreased range of motion;Impaired UE functional use;Decreased knowledge of use of DME;Decreased endurance;Impaired tone   Rehab Potential Good   OT Frequency 2x / week   OT Duration 12 weeks   OT Treatment/Interventions Self-care/ADL  training;Electrical Stimulation;Moist Heat;Manual Therapy;DME and/or AE instruction;Therapeutic activities;Passive range of motion;Ultrasound;Therapeutic exercises;Patient/family education;Neuromuscular education;Therapeutic exercise;Visual/perceptual remediation/compensation;Balance training   Plan Plan to work on improving UE strength, ROM, grip and pinch strength, as well as the development of [place].   Consulted and Agree with Plan of Care Patient        Problem List Patient Active Problem List   Diagnosis Date Noted  . Acute lower UTI   . Ischemic stroke of frontal lobe ([place]) 06/16/2015  . Dysphagia as late effect of cerebrovascular disease   . Hemiparesis affecting dominant side as late effect of stroke ([place])   . Esophageal stricture   . Bacteremia   . Atrial flutter ([place])   . Rheumatoid arthritis ([place])   .  Hyperglycemia   . Thyroid activity decreased   . Infection due to Streptococcus pyogenes 06/12/2015  . Severe sepsis with septic shock ([place]) 06/12/2015  . Hematuria 06/12/2015  . Dysphagia 06/12/2015  . 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 CVA (cerebral infarction)   . Acute respiratory failure ([place])   . Encounter for diagnostic procedure   . Ventilator dependence ([place])   . Carotid thromboses   . HLD (hyperlipidemia)   . Respiratory failure ([place])   . Stroke (cerebrum) ([place])   . Research study patient   . Endotracheal tube present   . Endotracheally intubated   . Encounter for feeding tube placement   . Altered mental state   . Weakness   . Sepsis ([place]) 05/30/2015  . CAP (community acquired pneumonia)   . Encephalopathy   . Encounter for central line placement   . Hypoxia   . Lactic acidosis    Harrel Carina, MS, OTR/L  Harrel Carina 07/13/2015, 12:19 PM  [place] MAIN [facility] SERVICES [address] [place], [place], [address] Phone: [phone]   Fax:  [phone]  Name: [name] [name] MRN: [id number] Date of  Birth: 04/19/1955

## 2015-07-13 NOTE — Patient Instructions (Signed)
Neuro muscular re-ed  Pt. performed reaching tasks crossing midline with weightbearing through RUE.  Pt. requires assist to maintain weightbearing through RUE and hand while reaching with LUE. Pt. also performed reaching tasks with the right UE to prepare UE for ROM, and functional hand use. Pt. Worked on grasping 1"objects using her thumb and 2nd digit. Wrist extension was encouraged when removing the object. Isolating the 2nd digit was encouraged when placing the cubes down. Position of 2nd digit and thumb was emphasized. Pt. unable to complete when resistance was placed on the object.  There ex.  AROM of right shoulder. Pt. performed elbow flexion/extension with 1# cuff weight. Pt. held onto the cuff weight for supination/pronation, and wrist extension. Pt. worked on lateral pinch strength and grip strengthening with gripper, and pink theraputty.

## 2015-07-20 ENCOUNTER — Ambulatory Visit: Payer: BC Managed Care – PPO | Attending: Physical Medicine & Rehabilitation | Admitting: Occupational Therapy

## 2015-07-20 ENCOUNTER — Encounter: Payer: Self-pay | Admitting: Physical Therapy

## 2015-07-20 ENCOUNTER — Encounter: Payer: BC Managed Care – PPO | Admitting: Speech Pathology

## 2015-07-20 ENCOUNTER — Ambulatory Visit: Payer: BC Managed Care – PPO | Admitting: Physical Therapy

## 2015-07-20 ENCOUNTER — Encounter: Payer: Self-pay | Admitting: Occupational Therapy

## 2015-07-20 DIAGNOSIS — M6281 Muscle weakness (generalized): Secondary | ICD-10-CM

## 2015-07-20 DIAGNOSIS — R279 Unspecified lack of coordination: Secondary | ICD-10-CM | POA: Insufficient documentation

## 2015-07-20 DIAGNOSIS — R278 Other lack of coordination: Secondary | ICD-10-CM | POA: Insufficient documentation

## 2015-07-20 NOTE — Patient Instructions (Signed)
OT TREATMENT     Neuro muscular re-education: Pt. performed reaching tasks crossing midline with weightbearing through RUE.  Pt. requires assist to maintain weightbearing through RUE and hand while reaching with LUE. Pt. also performed reaching tasks with the right UE. Cues were required for proper alignment and positioning. Pt. performed at the tabletop.  Therapeutic Exercise: Pt. performed gross gripping with hand held grip strengthener with yellow band resistance. Support was provided. Intrinsic stretches were performed with visual cues. Pt. Worked on grasping 1" cubes with emphasis placed on thumb and 2nd digit alignment at the distal pads of the digits.   Selfcare:  Manual Therapy:

## 2015-07-20 NOTE — Therapy (Signed)
[place] MAIN [facility] SERVICES [address] [place], [place], [address] Phone: [phone]   Fax:  [phone]  Physical Therapy Treatment  Patient Details  Name: [name] [name] MRN: [id number] Date of Birth: 14-Sep-1955 Referring Provider: Charlett Blake  Encounter Date: 07/20/2015      PT End of Session - 07/20/15 1524    Visit Number 3   Number of Visits 8   Date for PT Re-Evaluation 08/20/15   Authorization Type g codes   PT Start Time 0315   PT Stop Time 0400   PT Time Calculation (min) 45 min      Past Medical History  Diagnosis Date  . Hypothyroidism   . Seizures ([place])   . Adenopathy   . Scoliosis   . Spleen anomaly   . Anemia   . Sjogren's disease ([place])   . Raynaud disease   . Shingles   . Osteopenia     Past Surgical History  Procedure Laterality Date  . Tonsillectomy    . Breast cyst aspiration Right 1990's    neg    There were no vitals filed for this visit.  Visit Diagnosis:  Muscle weakness (generalized)  Lack of coordination      Subjective Assessment - 07/20/15 1522    Subjective Patient has been having stomach pain and she thinks its form eating corn.    Currently in Pain? No/denies     Therapeutic exercise: LE exercises including leg press with 45 lbs x 15 x 3, heel raises x 15 x 3 TM walking x 1.2 miles / hour x 10 minutes Ascending/descending steps x 4 steps x 3  Step ups x 20 BLE Step downs eccentric  Cross trainner octagone x 10 mintues Patient needs occasional verbal cueing to improve posture and cueing to correctly perform exercises slowly, holding at end of range to increase motor firing of desired muscle to encourage fatigue.                             PT Education - 07/20/15 1523    Education provided Yes   Education Details HEP progression   Person(s) Educated Patient   Methods Explanation   Comprehension Verbalized understanding             PT  Long Term Goals - 07/05/15 1613    PT LONG TERM GOAL #1   Title Patient will be independent in home exercise program to improve strength/mobility for better functional independence with ADLs   Time 1   Period Months   Status New   PT LONG TERM GOAL #2   Title Patient will increase six minute walk test distance to >1000 for progression to community ambulator and improve gait ability   Time 1   Period Months   Status New               Plan - 07/20/15 1524    Clinical Impression Statement Patient is able to perform all exercises with light weight and resistance due to g-tube and being tender in her abdomen.    Pt will benefit from skilled therapeutic intervention in order to improve on the following deficits Decreased activity tolerance;Decreased endurance;Decreased strength   Rehab Potential Excellent   PT Frequency 2x / week   PT Duration 4 weeks   PT Treatment/Interventions Therapeutic exercise;Gait training;Therapeutic activities   PT Next Visit Plan HEP progression and strengthenign   Consulted and Agree  with Plan of Care Patient        Problem List Patient Active Problem List   Diagnosis Date Noted  . Acute lower UTI   . Ischemic stroke of frontal lobe ([place]) 06/16/2015  . Dysphagia as late effect of cerebrovascular disease   . Hemiparesis affecting dominant side as late effect of stroke ([place])   . Esophageal stricture   . Bacteremia   . Atrial flutter ([place])   . Rheumatoid arthritis ([place])   . Hyperglycemia   . Thyroid activity decreased   . Infection due to Streptococcus pyogenes 06/12/2015  . Severe sepsis with septic shock ([place]) 06/12/2015  . Hematuria 06/12/2015  . Dysphagia 06/12/2015  . 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 CVA (cerebral infarction)   . Acute respiratory failure ([place])   . Encounter for diagnostic procedure   . Ventilator dependence ([place])   . Carotid thromboses   . HLD (hyperlipidemia)   . Respiratory failure ([place])   . Stroke (cerebrum)  ([place])   . Research study patient   . Endotracheal tube present   . Endotracheally intubated   . Encounter for feeding tube placement   . Altered mental state   . Weakness   . Sepsis ([place]) 05/30/2015  . CAP (community acquired pneumonia)   . Encephalopathy   . Encounter for central line placement   . Hypoxia   . Lactic acidosis    Alanson Puls, PT, DPT [place], [place] S 07/20/2015, 3:26 PM  [place] MAIN [facility] SERVICES [address] [place], [place], [address] Phone: [phone]   Fax:  [phone]  Name: [name] [name] MRN: [id number] Date of Birth: 1955/11/01

## 2015-07-20 NOTE — Therapy (Signed)
[name] [name] [facility] SERVICES [address] [place], [place], [address] Phone: [phone]   Fax:  [phone]  Occupational Therapy Treatment  Patient Details  Name: [name] [name] MRN: [id number] Date of Birth: 1956-02-01 Referring Provider: Read [name]  Encounter Date: 07/20/2015      OT End of Session - 07/20/15 1705    Visit Number 3   Number of Visits 30   Date for OT Re-Evaluation 10/03/15   Authorization Type 30 visit limit   OT Start Time 1435   OT Stop Time 1515   OT Time Calculation (min) 40 min   Activity Tolerance Patient tolerated treatment well   Behavior During Therapy [facility] for tasks assessed/performed      Past Medical History  Diagnosis Date  . Hypothyroidism   . Seizures ([place])   . Adenopathy   . Scoliosis   . Spleen anomaly   . Anemia   . Sjogren's disease ([place])   . Raynaud disease   . Shingles   . Osteopenia     Past Surgical History  Procedure Laterality Date  . Tonsillectomy    . Breast cyst aspiration Right 1990's    neg    There were no vitals filed for this visit.  Visit Diagnosis:  Muscle weakness (generalized)      Subjective Assessment - 07/20/15 1440    Subjective  Pt. reports having head an episode with abdomen when eating corn this past week. Pt. reports getting her feeding tube out soon.   Pertinent History [name] [name] is a 60 y.o.right handed female with history of Sjogrens disease/rheumatoid arthritis, noted history of seizure but not on antiepileptic meds and dysphagia. Patient diagnosed 4 days prior with an placed on Tamiflu. Lives with spouse independent prior to admission. 2 level home with bedroom on [name] level. 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 Presented 05/29/2015 with right side weakness and gait instability. Patient with severe restrictive airway distress when he presented to the ED with oxygen saturations in the 70s requiring nonrebreathing mask and later  intubated with slow extubation follow-up per critical care pulmonary services. Chest x-ray showed large right lung consolidative changes. CT angiogram chest no evidence of pulmonary emboli. Cranial CT scan negative. 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 Echocardiogram with ejection fraction of 65% no wall motion abnormalities. 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 EEG was negative for seizure. Patient did not receive TPA. Blood cultures showed group A strep pansensitive 05/30/2015 and currently maintained on Rocephin with droplet contact precautions   Patient Stated Goals To regain use of her right hand.   Currently in Pain? No/denies   Multiple Pain Sites --  Pt. had pain in abdomen one evening this past  week after eating corn.  Pain was rated as a 7-8.      OT TREATMENT     Neuro muscular re-education: Pt. performed reaching tasks crossing midline with weightbearing through RUE.  Pt. requires assist to maintain weightbearing through RUE and hand while reaching with LUE. Pt. also performed reaching tasks with the right UE. Cues were required for proper alignment and positioning. Pt. performed at the tabletop.  Therapeutic Exercise: Pt. performed gross gripping with hand held grip strengthener with yellow band resistance. Support was provided. Intrinsic stretches were performed with visual cues. Pt. Worked on grasping 1" cubes with emphasis placed on thumb and 2nd digit alignment at the distal  pads of the digits.                          OT Education - 07/20/15 1443    Education provided Yes   Education Details Desired thumb and hand movements   Person(s) Educated Patient   Methods Explanation   Comprehension Verbalized understanding             OT Long Term Goals - 07/05/15 1609    OT LONG TERM GOAL #1   Title Pt. will improve right shoulder flexion strength by 2 mm grades to assist with  reaching into cabinetry.   Baseline Pt. is unable   Time 12   Period Weeks   Status New   OT LONG TERM GOAL #2   Title Pt. will improve right grip fisting with digit flexion to [facility] improved by 2 cm in preparation for grasping/holding ADL grooming and feeding items.   Baseline limited digit fisting flexion to [facility].   Time 12   Period Weeks   Status New   OT LONG TERM GOAL #3   Title Pt. will improve right elbow flexion and extension strength by 2 mm grades to be able to apply deoderant and wash left axilla region.   Baseline Pt. requires assist to apply deoderant and wash left axilla.   Time 12   Period Weeks   Status New   OT LONG TERM GOAL #4   Title Pt. will improve right shoulder abduction by 10 degrees to assist with UE dressing.   Baseline Pt. requires assist   Time 12   Period Weeks   Status New   OT LONG TERM GOAL #5   Title Pt. will be independent with light meal prep.   Baseline Pt. requires assist   Time 12   Period Weeks   Status New               Plan - 07/20/15 1706    Clinical Impression Statement Although pt. is improving, she continues to present with limited gross gripping. Pt. presents with limited RUE strengthening, as well as functional grasping with the distal pads of the thumb and 2nd digit.   Pt will benefit from skilled therapeutic intervention in order to improve on the following deficits (Retired) Decreased balance;Decreased strength;Decreased coordination;Decreased activity tolerance;Decreased range of motion;Impaired UE functional use;Decreased knowledge of use of DME;Decreased endurance;Impaired tone   Rehab Potential Good   OT Frequency 2x / week   OT Duration 12 weeks   OT Treatment/Interventions Self-care/ADL training;Electrical Stimulation;Moist Heat;Manual Therapy;DME and/or AE instruction;Therapeutic activities;Passive range of motion;Ultrasound;Therapeutic exercises;Patient/family education;Neuromuscular education;Therapeutic  exercise;Visual/perceptual remediation/compensation;Balance training   Consulted and Agree with Plan of Care Patient        Problem List Patient Active Problem List   Diagnosis Date Noted  . Acute lower UTI   . Ischemic stroke of frontal lobe ([place]) 06/16/2015  . Dysphagia as late effect of cerebrovascular disease   . Hemiparesis affecting dominant side as late effect of stroke ([place])   . Esophageal stricture   . Bacteremia   . Atrial flutter ([place])   . Rheumatoid arthritis ([place])   . Hyperglycemia   . Thyroid activity decreased   . Infection due to Streptococcus pyogenes 06/12/2015  . Severe sepsis with septic shock ([place]) 06/12/2015  . Hematuria 06/12/2015  . Dysphagia 06/12/2015  . 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 CVA (cerebral infarction)   . Acute respiratory failure ([place])   . Encounter  for diagnostic procedure   . Ventilator dependence ([place])   . Carotid thromboses   . HLD (hyperlipidemia)   . Respiratory failure ([place])   . Stroke (cerebrum) ([place])   . Research study patient   . Endotracheal tube present   . Endotracheally intubated   . Encounter for feeding tube placement   . Altered mental state   . Weakness   . Sepsis ([place]) 05/30/2015  . CAP (community acquired pneumonia)   . Encephalopathy   . Encounter for central line placement   . Hypoxia   . Lactic acidosis    Harrel Carina, MS, OTR/L  Harrel Carina 07/20/2015, 5:19 PM  [name] [facility] SERVICES [address] [place], [place], [address] Phone: [phone]   Fax:  [phone]  Name: [name] [name] MRN: [id number] Date of Birth: Sep 24, 1955

## 2015-07-22 ENCOUNTER — Encounter: Payer: BC Managed Care – PPO | Admitting: Speech Pathology

## 2015-07-23 ENCOUNTER — Encounter: Payer: Self-pay | Admitting: Occupational Therapy

## 2015-07-23 ENCOUNTER — Ambulatory Visit: Payer: BC Managed Care – PPO | Admitting: Occupational Therapy

## 2015-07-23 DIAGNOSIS — M6281 Muscle weakness (generalized): Secondary | ICD-10-CM

## 2015-07-23 DIAGNOSIS — R278 Other lack of coordination: Secondary | ICD-10-CM

## 2015-07-23 NOTE — Therapy (Signed)
[place] MAIN [facility] SERVICES [address] [place], [place], [address] Phone: [phone]   Fax:  [phone]  Occupational Therapy Treatment  Patient Details  Name: [name] [name] MRN: [id number] Date of Birth: December 08, 1955 Referring Provider: Read Drivers  Encounter Date: 07/23/2015      OT End of Session - 07/23/15 2203    Visit Number 4   Number of Visits 30   Date for OT Re-Evaluation 10/03/15   Authorization Type 30 visit limit   OT Start Time 1031   OT Stop Time 1115   OT Time Calculation (min) 44 min   Activity Tolerance Patient tolerated treatment well   Behavior During Therapy [facility] for tasks assessed/performed      Past Medical History  Diagnosis Date  . Hypothyroidism   . Seizures ([place])   . Adenopathy   . Scoliosis   . Spleen anomaly   . Anemia   . Sjogren's disease ([place])   . Raynaud disease   . Shingles   . Osteopenia     Past Surgical History  Procedure Laterality Date  . Tonsillectomy    . Breast cyst aspiration Right 1990's    neg    There were no vitals filed for this visit.      Subjective Assessment - 07/23/15 2115    Patient Stated Goals (p) To regain use of her right hand.                      OT Treatments/Exercises (OP) - 07/23/15 2155    Fine Motor Coordination   Other Fine Motor Exercises Attempts at manipulation of [place] discs with moderate cues for finger placement, isolated movements of index, MF and thumb, increased difficulty noted.  Ball pegs removing from board with emphasis on prehension patterns of thumb and index pad to pad use. Moderate cues and occasional guiding required.     Neurological Re-education Exercises   Other Exercises 1 Patient was seen for UE strengthening with 1# dowel for shoulder flexion, ABD, ADD, chest press, forwards circles, backwards circles and elbow flexion extension for 10 reps for 2 set each exercise with cues for technique.                        OT Long Term Goals - 07/05/15 1609    OT LONG TERM GOAL #1   Title Pt. will improve right shoulder flexion strength by 2 mm grades to assist with reaching into cabinetry.   Baseline Pt. is unable   Time 12   Period Weeks   Status New   OT LONG TERM GOAL #2   Title Pt. will improve right grip fisting with digit flexion to [facility] improved by 2 cm in preparation for grasping/holding ADL grooming and feeding items.   Baseline limited digit fisting flexion to [facility].   Time 12   Period Weeks   Status New   OT LONG TERM GOAL #3   Title Pt. will improve right elbow flexion and extension strength by 2 mm grades to be able to apply deoderant and wash left axilla region.   Baseline Pt. requires assist to apply deoderant and wash left axilla.   Time 12   Period Weeks   Status New   OT LONG TERM GOAL #4   Title Pt. will improve right shoulder abduction by 10 degrees to assist with UE dressing.   Baseline Pt. requires assist   Time 12   Period  Weeks   Status New   OT LONG TERM GOAL #5   Title Pt. will be independent with light meal prep.   Baseline Pt. requires assist   Time 12   Period Weeks   Status New               Plan - 07/23/15 2203    Clinical Impression Statement Patient demonstrates difficulty  with R hand manipulation of objects, in hand manipulation, isolated finger movements and oppostional prehension patterns.  She continues to benefit from skilled OT to maximize her safety and independence in necessary daily tasks.    Rehab Potential Good   OT Frequency 2x / week   OT Duration 12 weeks   OT Treatment/Interventions Self-care/ADL training;Electrical Stimulation;Moist Heat;Manual Therapy;DME and/or AE instruction;Therapeutic activities;Passive range of motion;Ultrasound;Therapeutic exercises;Patient/family education;Neuromuscular education;Therapeutic exercise;Visual/perceptual remediation/compensation;Balance training   Consulted and Agree  with Plan of Care Patient      Patient will benefit from skilled therapeutic intervention in order to improve the following deficits and impairments:  Decreased balance, Decreased strength, Decreased coordination, Decreased activity tolerance, Decreased range of motion, Impaired UE functional use, Decreased knowledge of use of DME, Decreased endurance, Impaired tone  Visit Diagnosis: Muscle weakness (generalized)  Other lack of coordination    Problem List Patient Active Problem List   Diagnosis Date Noted  . Acute lower UTI   . Ischemic stroke of frontal lobe ([place]) 06/16/2015  . Dysphagia as late effect of cerebrovascular disease   . Hemiparesis affecting dominant side as late effect of stroke ([place])   . Esophageal stricture   . Bacteremia   . Atrial flutter ([place])   . Rheumatoid arthritis ([place])   . Hyperglycemia   . Thyroid activity decreased   . Infection due to Streptococcus pyogenes 06/12/2015  . Severe sepsis with septic shock ([place]) 06/12/2015  . Hematuria 06/12/2015  . Dysphagia 06/12/2015  . 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 CVA (cerebral infarction)   . Acute respiratory failure ([place])   . Encounter for diagnostic procedure   . Ventilator dependence ([place])   . Carotid thromboses   . HLD (hyperlipidemia)   . Respiratory failure ([place])   . Stroke (cerebrum) ([place])   . Research study patient   . Endotracheal tube present   . Endotracheally intubated   . Encounter for feeding tube placement   . Altered mental state   . Weakness   . Sepsis ([place]) 05/30/2015  . CAP (community acquired pneumonia)   . Encephalopathy   . Encounter for central line placement   . Hypoxia   . Lactic acidosis    Severiano Utsey Oneita Jolly, OTR/L, CLT  Jasyn Mey 07/23/2015, 10:06 PM  [place] MAIN [facility] SERVICES [address] [place], [place], [address] Phone: [phone]   Fax:  [phone]  Name: [name] [name] MRN: [id number] Date of Birth:  [birth year]

## 2015-07-26 ENCOUNTER — Ambulatory Visit (HOSPITAL_COMMUNITY)
Admission: RE | Admit: 2015-07-26 | Discharge: 2015-07-26 | Disposition: A | Discharge status: Home / Self Care | Payer: BC Managed Care – PPO | Source: Ambulatory Visit | Attending: Family Medicine | Admitting: Family Medicine

## 2015-07-26 DIAGNOSIS — Z431 Encounter for attention to gastrostomy: Secondary | ICD-10-CM | POA: Diagnosis present

## 2015-07-26 DIAGNOSIS — R1310 Dysphagia, unspecified: Secondary | ICD-10-CM

## 2015-07-26 MED ORDER — LIDOCAINE VISCOUS 2 % MT SOLN
OROMUCOSAL | Status: AC
  Filled 2015-07-26: qty 15

## 2015-07-26 NOTE — Procedures (Signed)
successgul 34fr g tube removal No comp Stable Full report in pacs

## 2015-07-27 ENCOUNTER — Encounter: Payer: BC Managed Care – PPO | Admitting: Speech Pathology

## 2015-07-27 ENCOUNTER — Encounter: Payer: Self-pay | Admitting: Occupational Therapy

## 2015-07-27 ENCOUNTER — Ambulatory Visit: Payer: BC Managed Care – PPO | Admitting: Physical Therapy

## 2015-07-27 ENCOUNTER — Ambulatory Visit: Payer: BC Managed Care – PPO | Admitting: Occupational Therapy

## 2015-07-27 VITALS — BP 91/41 | HR 86

## 2015-07-27 DIAGNOSIS — M6281 Muscle weakness (generalized): Secondary | ICD-10-CM | POA: Diagnosis not present

## 2015-07-27 DIAGNOSIS — R278 Other lack of coordination: Secondary | ICD-10-CM

## 2015-07-27 NOTE — Therapy (Signed)
[place] MAIN [facility] SERVICES [address] [place], [place], [address] Phone: [phone]   Fax:  [phone]  Physical Therapy Treatment  Patient Details  Name: [name] [name] MRN: [id number] Date of Birth: 1956-02-10 Referring Provider: Charlett Blake  Encounter Date: 07/27/2015    Past Medical History  Diagnosis Date  . Hypothyroidism   . Seizures ([place])   . Adenopathy   . Scoliosis   . Spleen anomaly   . Anemia   . Sjogren's disease ([place])   . Raynaud disease   . Shingles   . Osteopenia     Past Surgical History  Procedure Laterality Date  . Tonsillectomy    . Breast cyst aspiration Right 1990's    neg    Filed Vitals:   07/27/15 1327  BP: 91/41  Pulse: 86    Pt reported having her peg tube taken out yesterday and her MD told her not to do any activities that would strain or pull at the site for a couple of days. Her BP was also low at 91/41 and rechecked at 94/47, but no dizziness reported. Today's therapy session was discontinued for safety reasons.                                   PT Long Term Goals - 07/05/15 1613    PT LONG TERM GOAL #1   Title Patient will be independent in home exercise program to improve strength/mobility for better functional independence with ADLs   Time 1   Period Months   Status New   PT LONG TERM GOAL #2   Title Patient will increase six minute walk test distance to >1000 for progression to community ambulator and improve gait ability   Time 1   Period Months   Status New             Patient will benefit from skilled therapeutic intervention in order to improve the following deficits and impairments:     Visit Diagnosis: Muscle weakness (generalized)     Problem List Patient Active Problem List   Diagnosis Date Noted  . Acute lower UTI   . Ischemic stroke of frontal lobe ([place]) 06/16/2015  . Dysphagia as late effect of cerebrovascular  disease   . Hemiparesis affecting dominant side as late effect of stroke ([place])   . Esophageal stricture   . Bacteremia   . Atrial flutter ([place])   . Rheumatoid arthritis ([place])   . Hyperglycemia   . Thyroid activity decreased   . Infection due to Streptococcus pyogenes 06/12/2015  . Severe sepsis with septic shock ([place]) 06/12/2015  . Hematuria 06/12/2015  . Dysphagia 06/12/2015  . 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 CVA (cerebral infarction)   . Acute respiratory failure ([place])   . Encounter for diagnostic procedure   . Ventilator dependence ([place])   . Carotid thromboses   . HLD (hyperlipidemia)   . Respiratory failure ([place])   . Stroke (cerebrum) ([place])   . Research study patient   . Endotracheal tube present   . Endotracheally intubated   . Encounter for feeding tube placement   . Altered mental state   . Weakness   . Sepsis ([place]) 05/30/2015  . CAP (community acquired pneumonia)   . Encephalopathy   . Encounter for central line placement   . Hypoxia   . Lactic acidosis     [name] Shiela Mayer, PT,  DPT  07/27/2015, 1:30 PM [place] MAIN [facility] SERVICES [place], [place], [address] Phone: [phone]   Fax:  [phone]  Name: [name] [name] MRN: [id number] Date of Birth: Dec 12, 1955

## 2015-07-29 ENCOUNTER — Encounter: Payer: BC Managed Care – PPO | Admitting: Speech Pathology

## 2015-07-29 ENCOUNTER — Ambulatory Visit: Payer: BC Managed Care – PPO | Admitting: Occupational Therapy

## 2015-07-29 DIAGNOSIS — M6281 Muscle weakness (generalized): Secondary | ICD-10-CM | POA: Diagnosis not present

## 2015-07-29 DIAGNOSIS — R278 Other lack of coordination: Secondary | ICD-10-CM

## 2015-07-29 NOTE — Patient Instructions (Signed)
OT TREATMENT    Neuro muscular re-education:  Pt. Worked on fisting and gross digit flexion in preparation for grasping.Pt. worked on grasping 1 &1/2" objects with her thumb and 2nd digit, and flipping the object in her hand. Emphasis was placed on thumb movements, and thumb alignment when grasping the objects. Cues for limited compensatory movements proximally when flipping the objects. Pt. Also worked on [employer] , and isolating her 2nd digit when placing them down. Pt. Has difficulty isolating 2nd digit movements. Pt. Worked on thumb movements with a 1" resistive taped ball to work on thumb movements in preparation for thumb opposition. Pt. Worked with taping pads of thumb to 2nd digit in preparation for thumb opposition.

## 2015-07-29 NOTE — Therapy (Signed)
[place] MAIN [facility] SERVICES [address] [place], [place], [address] Phone: [phone]   Fax:  [phone]  Occupational Therapy Treatment  Patient Details  Name: [name] [name] MRN: [id number] Date of Birth: 04/02/1956 Referring Provider: Read Drivers  Encounter Date: 07/29/2015      OT End of Session - 07/29/15 1659    Visit Number 6   Number of Visits 30   Date for OT Re-Evaluation 10/03/15   Authorization Type 30 visit limit   OT Start Time 1433   OT Stop Time 1515   OT Time Calculation (min) 42 min   Activity Tolerance Patient tolerated treatment well   Behavior During Therapy [facility] for tasks assessed/performed      Past Medical History  Diagnosis Date  . Hypothyroidism   . Seizures ([place])   . Adenopathy   . Scoliosis   . Spleen anomaly   . Anemia   . Sjogren's disease ([place])   . Raynaud disease   . Shingles   . Osteopenia     Past Surgical History  Procedure Laterality Date  . Tonsillectomy    . Breast cyst aspiration Right 1990's    neg    There were no vitals filed for this visit.      Subjective Assessment - 07/29/15 1656    Subjective  Pt. reports her peg tube was removed on Monday. Activity restricted to light activity.   Patient is accompained by: Family member   Pertinent History [name] [name] is a 60 y.o.right handed female with history of Sjogrens disease/rheumatoid arthritis, noted history of seizure but not on antiepileptic meds and dysphagia. Patient diagnosed 4 days prior with an placed on Tamiflu. Lives with spouse independent prior to admission. 2 level home with bedroom on main level. 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 Presented 05/29/2015 with right side weakness and gait instability. Patient with severe restrictive airway distress when he presented to the ED with oxygen saturations in the 70s requiring nonrebreathing mask and later intubated with slow extubation follow-up per  critical care pulmonary services. Chest x-ray showed large right lung consolidative changes. CT angiogram chest no evidence of pulmonary emboli. Cranial CT scan negative. 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 Echocardiogram with ejection fraction of 65% no wall motion abnormalities. 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 EEG was negative for seizure. Patient did not receive TPA. Blood cultures showed group A strep pansensitive 05/30/2015 and currently maintained on Rocephin with droplet contact precautions   Currently in Pain? No/denies   Pain Score 0-No pain      OT TREATMENT    Neuro muscular re-education:  Pt. Worked on fisting and gross digit flexion in preparation for grasping.Pt. worked on grasping 1 &1/2" objects with her thumb and 2nd digit, and flipping the object in her hand. Emphasis was placed on thumb movements, and thumb alignment when grasping the objects. Cues for limited compensatory movements proximally when flipping the objects. Pt. Also worked on [employer] , and isolating her 2nd digit when placing them down. Pt. Has difficulty isolating 2nd digit movements. Pt. Worked on thumb movements with a 1" resistive taped ball to work on thumb movements in preparation for thumb opposition. Pt. Worked with taping pads of thumb to 2nd digit in preparation for thumb opposition.                 OT Treatments/Exercises (OP) -  07/28/15 1420    Fine Motor Coordination   Other Fine Motor Exercises Patient seen for focus on fine motor coordination tasks with manipulation of objects 1/2 to 1 inch in size and various shapes to pick up, turn items, move in the hand and work on isolated finger movements, patient requires cues both verbal and tactile.  Finger ABD/ADD with blocking at the wrist for compensation.  Finger tapping of isolated fingers with cues.  Prehension patterns with pad to pad of index and thumb.  Thumb  movements for ABD/ADD and flexion/extension.  Instructed on patterns of functional hand positioning when picking up items.    Neurological Re-education Exercises   Other Exercises 1 Patient seen for light hand strengthening tasks with red putty for fisting, finger flexion patterns with cues for technique.                 OT Education - 07/29/15 1657    Education provided Yes   Education Details [place] tasks, and thumb movements.   Person(s) Educated Patient   Methods Explanation;Demonstration;Verbal cues   Comprehension Verbalized understanding;Returned demonstration             OT Long Term Goals - 07/28/15 1421    OT LONG TERM GOAL #1   Title Pt. will improve right shoulder flexion strength by 2 mm grades to assist with reaching into cabinetry.   Baseline Pt. is unable   Time 12   Period Weeks   Status On-going   OT LONG TERM GOAL #2   Title Pt. will improve right grip fisting with digit flexion to [facility] improved by 2 cm in preparation for grasping/holding ADL grooming and feeding items.   Baseline limited digit fisting flexion to [facility].   Time 12   Period Weeks   Status On-going   OT LONG TERM GOAL #3   Title Pt. will improve right elbow flexion and extension strength by 2 mm grades to be able to apply deoderant and wash left axilla region.   Baseline Pt. requires assist to apply deoderant and wash left axilla.   Time 12   Period Weeks   Status On-going   OT LONG TERM GOAL #4   Title Pt. will improve right shoulder abduction by 10 degrees to assist with UE dressing.   Baseline Pt. requires assist   Time 12   Period Weeks   Status On-going   OT LONG TERM GOAL #5   Title Pt. will be independent with light meal prep.   Baseline Pt. requires assist   Time 12   Period Weeks   Status On-going               Plan - 07/29/15 1701    Clinical Impression Statement Pt. continues to present with difficulty in fisting, and performing full digit flexion. Pt.  continues to work on grasping, and manipulating 1 &1/2" objects with emphasis placed on thumb alignment with the 2nd digit. Pt. conitnues to benefit from skilled OT serrvices to work on improving hand function for ADL and IADL tasks.   Rehab Potential Good   OT Frequency 2x / week   OT Duration 12 weeks   OT Treatment/Interventions Self-care/ADL training;Electrical Stimulation;Moist Heat;Manual Therapy;DME and/or AE instruction;Therapeutic activities;Passive range of motion;Ultrasound;Therapeutic exercises;Patient/family education;Neuromuscular education;Therapeutic exercise;Visual/perceptual remediation/compensation;Balance training   Consulted and Agree with Plan of Care Patient      Patient will benefit from skilled therapeutic intervention in order to improve the following deficits and impairments:  Decreased balance, Decreased strength,  Decreased coordination, Decreased activity tolerance, Decreased range of motion, Impaired UE functional use, Decreased knowledge of use of DME, Decreased endurance, Impaired tone  Visit Diagnosis: Other lack of coordination    Problem List Patient Active Problem List   Diagnosis Date Noted  . Acute lower UTI   . Ischemic stroke of frontal lobe ([place]) 06/16/2015  . Dysphagia as late effect of cerebrovascular disease   . Hemiparesis affecting dominant side as late effect of stroke ([place])   . Esophageal stricture   . Bacteremia   . Atrial flutter ([place])   . Rheumatoid arthritis ([place])   . Hyperglycemia   . Thyroid activity decreased   . Infection due to Streptococcus pyogenes 06/12/2015  . Severe sepsis with septic shock ([place]) 06/12/2015  . Hematuria 06/12/2015  . Dysphagia 06/12/2015  . 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 CVA (cerebral infarction)   . Acute respiratory failure ([place])   . Encounter for diagnostic procedure   . Ventilator dependence ([place])   . Carotid thromboses   . HLD (hyperlipidemia)   . Respiratory failure ([place])   . Stroke  (cerebrum) ([place])   . Research study patient   . Endotracheal tube present   . Endotracheally intubated   . Encounter for feeding tube placement   . Altered mental state   . Weakness   . Sepsis ([place]) 05/30/2015  . CAP (community acquired pneumonia)   . Encephalopathy   . Encounter for central line placement   . Hypoxia   . Lactic acidosis    Harrel Carina, MS, OTR/L   Harrel Carina 07/29/2015, 5:18 PM  [place] MAIN [facility] SERVICES [address] [place], [place], [address] Phone: [phone]   Fax:  [phone]  Name: Rafelita Brossman MRN: [id number] Date of Birth: 11-02-1955

## 2015-07-29 NOTE — Therapy (Signed)
[place] MAIN [facility] SERVICES [address] [place], [place], [address] Phone: [phone]   Fax:  [phone]  Occupational Therapy Treatment  Patient Details  Name: [name] [name] MRN: [id number] Date of Birth: [birth year] Referring Provider: Read Drivers  Encounter Date: 07/27/2015      OT End of Session - 07/29/15 1420    Visit Number 5   Number of Visits 30   Date for OT Re-Evaluation 10/03/15   Authorization Type 30 visit limit   Activity Tolerance Patient tolerated treatment well   Behavior During Therapy [facility] for tasks assessed/performed      Past Medical History  Diagnosis Date  . Hypothyroidism   . Seizures ([place])   . Adenopathy   . Scoliosis   . Spleen anomaly   . Anemia   . Sjogren's disease ([place])   . Raynaud disease   . Shingles   . Osteopenia     Past Surgical History  Procedure Laterality Date  . Tonsillectomy    . Breast cyst aspiration Right 1990's    neg    There were no vitals filed for this visit.      Subjective Assessment - 07/28/15 1419    Subjective  Patient reports she had her peg tube removed yesterday, must do light activity this date with no tugging and pulling.  Blood pressure was low this am but she reports feeling fine and wanted to work on coordination tasks and light hand strengthening.   Pertinent History [name] [name] is a 60 y.o.right handed female with history of Sjogrens disease/rheumatoid arthritis, noted history of seizure but not on antiepileptic meds and dysphagia. Patient diagnosed 4 days prior with an placed on Tamiflu. Lives with spouse independent prior to admission. 2 level home with bedroom on main level. 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 Presented 05/29/2015 with right side weakness and gait instability. Patient with severe restrictive airway distress when he presented to the ED with oxygen saturations in the 70s requiring nonrebreathing mask and later intubated  with slow extubation follow-up per critical care pulmonary services. Chest x-ray showed large right lung consolidative changes. CT angiogram chest no evidence of pulmonary emboli. Cranial CT scan negative. 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 Echocardiogram with ejection fraction of 65% no wall motion abnormalities. 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 EEG was negative for seizure. Patient did not receive TPA. Blood cultures showed group A strep pansensitive 05/30/2015 and currently maintained on Rocephin with droplet contact precautions   Patient Stated Goals To regain use of her right hand.                      OT Treatments/Exercises (OP) - 07/28/15 1420    Fine Motor Coordination   Other Fine Motor Exercises Patient seen for focus on fine motor coordination tasks with manipulation of objects 1/2 to 1 inch in size and various shapes to pick up, turn items, move in the hand and work on isolated finger movements, patient requires cues both verbal and tactile.  Finger ABD/ADD with blocking at the wrist for compensation.  Finger tapping of isolated fingers with cues.  Prehension patterns with pad to pad of index and thumb.  Thumb movements for ABD/ADD and flexion/extension.  Instructed on patterns of functional hand positioning when picking up items.    Neurological Re-education Exercises   Other Exercises 1 Patient seen for  light hand strengthening tasks with red putty for fisting, finger flexion patterns with cues for technique.                 OT Education - 07/28/15 1420    Education provided Yes             OT Long Term Goals - 07/28/15 1421    OT LONG TERM GOAL #1   Title Pt. will improve right shoulder flexion strength by 2 mm grades to assist with reaching into cabinetry.   Baseline Pt. is unable   Time 12   Period Weeks   Status On-going   OT LONG TERM GOAL #2   Title Pt. will improve  right grip fisting with digit flexion to [facility] improved by 2 cm in preparation for grasping/holding ADL grooming and feeding items.   Baseline limited digit fisting flexion to [facility].   Time 12   Period Weeks   Status On-going   OT LONG TERM GOAL #3   Title Pt. will improve right elbow flexion and extension strength by 2 mm grades to be able to apply deoderant and wash left axilla region.   Baseline Pt. requires assist to apply deoderant and wash left axilla.   Time 12   Period Weeks   Status On-going   OT LONG TERM GOAL #4   Title Pt. will improve right shoulder abduction by 10 degrees to assist with UE dressing.   Baseline Pt. requires assist   Time 12   Period Weeks   Status On-going   OT LONG TERM GOAL #5   Title Pt. will be independent with light meal prep.   Baseline Pt. requires assist   Time 12   Period Weeks   Status On-going               Plan - 07/29/15 1421    Clinical Impression Statement Patient continues to demo difficulty with manipulation of objects in the right hand, unable to produce full flexion of the digits.  She is working towards isolated finger movements, ROM, strength and coordination of the hand as well as the right shoulder to complete tasks.  She would continue to benefit from skilled OT to maximize her safety and independence with necessary daily tasks at home.     Rehab Potential Good   OT Frequency 2x / week   OT Duration 12 weeks   OT Treatment/Interventions Self-care/ADL training;Electrical Stimulation;Moist Heat;Manual Therapy;DME and/or AE instruction;Therapeutic activities;Passive range of motion;Ultrasound;Therapeutic exercises;Patient/family education;Neuromuscular education;Therapeutic exercise;Visual/perceptual remediation/compensation;Balance training   Consulted and Agree with Plan of Care Patient      Patient will benefit from skilled therapeutic intervention in order to improve the following deficits and impairments:  Decreased  balance, Decreased strength, Decreased coordination, Decreased activity tolerance, Decreased range of motion, Impaired UE functional use, Decreased knowledge of use of DME, Decreased endurance, Impaired tone  Visit Diagnosis: Muscle weakness (generalized) - Plan: Ot plan of care cert/re-cert  Other lack of coordination - Plan: Ot plan of care cert/re-cert    Problem List Patient Active Problem List   Diagnosis Date Noted  . Acute lower UTI   . Ischemic stroke of frontal lobe ([place]) 06/16/2015  . Dysphagia as late effect of cerebrovascular disease   . Hemiparesis affecting dominant side as late effect of stroke ([place])   . Esophageal stricture   . Bacteremia   . Atrial flutter ([place])   . Rheumatoid arthritis ([place])   . Hyperglycemia   . Thyroid activity decreased   .  Infection due to Streptococcus pyogenes 06/12/2015  . Severe sepsis with septic shock ([place]) 06/12/2015  . Hematuria 06/12/2015  . Dysphagia 06/12/2015  . 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 CVA (cerebral infarction)   . Acute respiratory failure ([place])   . Encounter for diagnostic procedure   . Ventilator dependence ([place])   . Carotid thromboses   . HLD (hyperlipidemia)   . Respiratory failure ([place])   . Stroke (cerebrum) ([place])   . Research study patient   . Endotracheal tube present   . Endotracheally intubated   . Encounter for feeding tube placement   . Altered mental state   . Weakness   . Sepsis ([place]) 05/30/2015  . CAP (community acquired pneumonia)   . Encephalopathy   . Encounter for central line placement   . Hypoxia   . Lactic acidosis    Amy Oneita Jolly, OTR/L, CLT  Lovett,Amy 07/29/2015, 2:41 PM  [place] MAIN [facility] SERVICES [address] [place], [place], [address] Phone: [phone]   Fax:  [phone]  Name: Tykeria Gnau MRN: [id number] Date of Birth: 02-04-1956

## 2015-08-02 ENCOUNTER — Encounter: Payer: BC Managed Care – PPO | Admitting: Speech Pathology

## 2015-08-02 ENCOUNTER — Ambulatory Visit: Payer: BC Managed Care – PPO | Admitting: Physical Therapy

## 2015-08-02 ENCOUNTER — Encounter: Payer: Self-pay | Admitting: Physical Therapy

## 2015-08-02 ENCOUNTER — Encounter: Payer: BC Managed Care – PPO | Admitting: Occupational Therapy

## 2015-08-02 ENCOUNTER — Ambulatory Visit: Payer: BC Managed Care – PPO | Admitting: Occupational Therapy

## 2015-08-02 DIAGNOSIS — M6281 Muscle weakness (generalized): Secondary | ICD-10-CM | POA: Diagnosis not present

## 2015-08-02 DIAGNOSIS — R278 Other lack of coordination: Secondary | ICD-10-CM

## 2015-08-02 NOTE — Therapy (Addendum)
[place] MAIN [facility] SERVICES [address] [place], [place], [address] Phone: [phone]   Fax:  [phone]  Physical Therapy Treatment  Patient Details  Name: [name] [name] MRN: [id number] Date of Birth: 10-28-1955 Referring Provider: Charlett Blake  Encounter Date: 08/02/2015      PT End of Session - 08/02/15 1558    Visit Number (p) 4   Number of Visits (p) 8   Date for PT Re-Evaluation (p) 08/13/15   Authorization Type (p) g codes   PT Start Time (p) 0345   PT Stop Time (p) 0430   PT Time Calculation (min) (p) 45 min   Equipment Utilized During Treatment (p) Gait belt   Behavior During Therapy (p) WFL for tasks assessed/performed      Past Medical History  Diagnosis Date  . Hypothyroidism   . Seizures ([place])   . Adenopathy   . Scoliosis   . Spleen anomaly   . Anemia   . Sjogren's disease ([place])   . Raynaud disease   . Shingles   . Osteopenia     Past Surgical History  Procedure Laterality Date  . Tonsillectomy    . Breast cyst aspiration Right 1990's    neg    There were no vitals filed for this visit.      Subjective Assessment - 08/02/15 1557    Subjective Patient had her g tube out.       Therapeutic exercise; Leg press x 20 x 2 with 90 lbs, heel raises x 20 x 2 Knee extension machine plate 2, knee flexion plate 3 UE scapula retraction x 20 x 2 plate 2 Triceps/biceps plate 2/1 x 20 x 2 Patient has fatigue after treatment and no reports of pain.                           PT Education - 08/02/15 1557    Education provided Yes   Education Details gym machine exercises   Person(s) Educated Patient   Methods Explanation   Comprehension Verbalized understanding             PT Long Term Goals - 07/05/15 1613    PT LONG TERM GOAL #1   Title Patient will be independent in home exercise program to improve strength/mobility for better functional independence with ADLs   Time  1   Period Months   Status New   PT LONG TERM GOAL #2   Title Patient will increase six minute walk test distance to >1000 for progression to community ambulator and improve gait ability   Time 1   Period Months   Status New               Plan - 08/02/15 1633    Clinical Impression Statement Patient was instructed in gym machines with correct posture and resistance.    Rehab Potential Excellent   PT Frequency 2x / week   PT Duration 4 weeks   PT Treatment/Interventions Therapeutic exercise;Gait training;Therapeutic activities   PT Next Visit Plan HEP progression and strengthenign   Consulted and Agree with Plan of Care Patient      Patient will benefit from skilled therapeutic intervention in order to improve the following deficits and impairments:  Decreased activity tolerance, Decreased endurance, Decreased strength  Visit Diagnosis: Muscle weakness (generalized)      Problem List Patient Active Problem List   Diagnosis Date Noted  . Acute lower UTI   .  Ischemic stroke of frontal lobe ([place]) 06/16/2015  . Dysphagia as late effect of cerebrovascular disease   . Hemiparesis affecting dominant side as late effect of stroke ([place])   . Esophageal stricture   . Bacteremia   . Atrial flutter ([place])   . Rheumatoid arthritis ([place])   . Hyperglycemia   . Thyroid activity decreased   . Infection due to Streptococcus pyogenes 06/12/2015  . Severe sepsis with septic shock ([place]) 06/12/2015  . Hematuria 06/12/2015  . Dysphagia 06/12/2015  . 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 CVA (cerebral infarction)   . Acute respiratory failure ([place])   . Encounter for diagnostic procedure   . Ventilator dependence ([place])   . Carotid thromboses   . HLD (hyperlipidemia)   . Respiratory failure ([place])   . Stroke (cerebrum) ([place])   . Research study patient   . Endotracheal tube present   . Endotracheally intubated   . Encounter for feeding tube placement   . Altered mental state   .  Weakness   . Sepsis ([place]) 05/30/2015  . CAP (community acquired pneumonia)   . Encephalopathy   . Encounter for central line placement   . Hypoxia   . Lactic acidosis   Alanson Puls, PT, DPT  Arelia Sneddon S 08/02/2015, 4:35 PM  [place] MAIN [facility] SERVICES [address] [place], [place], [address] Phone: [phone]   Fax:  [phone]  Name: [name] [name] MRN: [id number] Date of Birth: Aug 17, 1955

## 2015-08-02 NOTE — Patient Instructions (Signed)
OT TREATMENT    Neuro muscular re-education: Pt. Worked on thumb opposition to the 2nd through 5th digits. Tape was utilized initially to prep the thenar eminence, and prepare the thumb for opposition to the 5th digit. Pt. Worked on grasping with emphasis placed on the form with the distal digit pads, while avoiding thumb PIP flexion when grasping objects. Pt. Alternated grasping with weightbearing. Pt. Worked on thumb movements and digits moving separately with a 1"circular objects, and transpore tape.  There. Ex: Pt. Worked fisting with intrinsic fisting exercises. Reviewed the HEP for fisting.

## 2015-08-02 NOTE — Therapy (Signed)
[place] MAIN [facility] SERVICES [address] [place], [place], [address] Phone: [phone]   Fax:  [phone]  Occupational Therapy Treatment  Patient Details  Name: [name] [name] MRN: [id number] Date of Birth: [birth year] Referring Provider: Read Drivers  Encounter Date: 08/02/2015      OT End of Session - 08/02/15 1624    Visit Number 7   Number of Visits 30   Date for OT Re-Evaluation 10/03/15   Authorization Type 30 visit limit   OT Start Time 1510   OT Stop Time 1548   OT Time Calculation (min) 38 min   Activity Tolerance Patient tolerated treatment well   Behavior During Therapy [facility] for tasks assessed/performed      Past Medical History  Diagnosis Date  . Hypothyroidism   . Seizures ([place])   . Adenopathy   . Scoliosis   . Spleen anomaly   . Anemia   . Sjogren's disease ([place])   . Raynaud disease   . Shingles   . Osteopenia     Past Surgical History  Procedure Laterality Date  . Tonsillectomy    . Breast cyst aspiration Right 1990's    neg    There were no vitals filed for this visit.      Subjective Assessment - 08/02/15 1623    Subjective  Pt. reports working hard at home on fisting.   Patient is accompained by: Family member   Pertinent History [name] [name] is a 60 y.o.right handed female with history of Sjogrens disease/rheumatoid arthritis, noted history of seizure but not on antiepileptic meds and dysphagia. Patient diagnosed 4 days prior with an placed on Tamiflu. Lives with spouse independent prior to admission. 2 level home with bedroom on main level. 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 Presented 05/29/2015 with right side weakness and gait instability. Patient with severe restrictive airway distress when he presented to the ED with oxygen saturations in the 70s requiring nonrebreathing mask and later intubated with slow extubation follow-up per critical care pulmonary services. Chest x-ray  showed large right lung consolidative changes. CT angiogram chest no evidence of pulmonary emboli. Cranial CT scan negative. 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 Echocardiogram with ejection fraction of 65% no wall motion abnormalities. 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 EEG was negative for seizure. Patient did not receive TPA. Blood cultures showed group A strep pansensitive 05/30/2015 and currently maintained on Rocephin with droplet contact precautions   Patient Stated Goals To regain use of her right hand.   Currently in Pain? No/denies   Pain Score 0-No pain      OT TREATMENT    Neuro muscular re-education: Pt. Worked on thumb opposition to the 2nd through 5th digits. Tape was utilized initially to prep the thenar eminence, and prepare the thumb for opposition to the 5th digit. Pt. Worked on grasping with emphasis placed on the form with the distal digit pads, while avoiding thumb PIP flexion when grasping objects. Pt. Alternated grasping with weightbearing. Pt. Worked on thumb movements and digits moving separately with a 1"circular objects, and transpore tape.  There. Ex: Pt. Worked fisting with intrinsic fisting exercises. Reviewed the HEP for fisting.                           OT Education - 08/02/15 1624    Education provided Yes  Person(s) Educated Patient   Methods Explanation   Comprehension Verbalized understanding             OT Long Term Goals - 07/28/15 1421    OT LONG TERM GOAL #1   Title Pt. will improve right shoulder flexion strength by 2 mm grades to assist with reaching into cabinetry.   Baseline Pt. is unable   Time 12   Period Weeks   Status On-going   OT LONG TERM GOAL #2   Title Pt. will improve right grip fisting with digit flexion to [facility] improved by 2 cm in preparation for grasping/holding ADL grooming and feeding items.   Baseline limited digit fisting  flexion to [facility].   Time 12   Period Weeks   Status On-going   OT LONG TERM GOAL #3   Title Pt. will improve right elbow flexion and extension strength by 2 mm grades to be able to apply deoderant and wash left axilla region.   Baseline Pt. requires assist to apply deoderant and wash left axilla.   Time 12   Period Weeks   Status On-going   OT LONG TERM GOAL #4   Title Pt. will improve right shoulder abduction by 10 degrees to assist with UE dressing.   Baseline Pt. requires assist   Time 12   Period Weeks   Status On-going   OT LONG TERM GOAL #5   Title Pt. will be independent with light meal prep.   Baseline Pt. requires assist   Time 12   Period Weeks   Status On-going               Plan - 08/02/15 1626    Clinical Impression Statement Pt. is improving with right hand function, however continues to present with impaired fisting, and [place] with using the thumb and second digit for grasping various sized objects. in proparation for ADl tasks.   Rehab Potential Good   OT Frequency 2x / week   OT Duration 12 weeks   OT Treatment/Interventions Self-care/ADL training;Electrical Stimulation;Moist Heat;Manual Therapy;DME and/or AE instruction;Therapeutic activities;Passive range of motion;Ultrasound;Therapeutic exercises;Patient/family education;Neuromuscular education;Therapeutic exercise;Visual/perceptual remediation/compensation;Balance training      Patient will benefit from skilled therapeutic intervention in order to improve the following deficits and impairments:  Decreased balance, Decreased strength, Decreased coordination, Decreased activity tolerance, Decreased range of motion, Impaired UE functional use, Decreased knowledge of use of DME, Decreased endurance, Impaired tone  Visit Diagnosis: Other lack of coordination    Problem List Patient Active Problem List   Diagnosis Date Noted  . Acute lower UTI   . Ischemic stroke of frontal lobe ([place]) 06/16/2015  .  Dysphagia as late effect of cerebrovascular disease   . Hemiparesis affecting dominant side as late effect of stroke ([place])   . Esophageal stricture   . Bacteremia   . Atrial flutter ([place])   . Rheumatoid arthritis ([place])   . Hyperglycemia   . Thyroid activity decreased   . Infection due to Streptococcus pyogenes 06/12/2015  . Severe sepsis with septic shock ([place]) 06/12/2015  . Hematuria 06/12/2015  . Dysphagia 06/12/2015  . 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 CVA (cerebral infarction)   . Acute respiratory failure ([place])   . Encounter for diagnostic procedure   . Ventilator dependence ([place])   . Carotid thromboses   . HLD (hyperlipidemia)   . Respiratory failure ([place])   . Stroke (cerebrum) ([place])   . Research study patient   . Endotracheal tube present   .  Endotracheally intubated   . Encounter for feeding tube placement   . Altered mental state   . Weakness   . Sepsis ([place]) 05/30/2015  . CAP (community acquired pneumonia)   . Encephalopathy   . Encounter for central line placement   . Hypoxia   . Lactic acidosis    Harrel Carina, MS, OTR/L  Harrel Carina 08/02/2015, 4:46 PM  [place] MAIN [facility] SERVICES [address] [place], [place], [address] Phone: [phone]   Fax:  [phone]  Name: Matina Hohnstein MRN: [id number] Date of Birth: 07/21/1955

## 2015-08-05 ENCOUNTER — Encounter: Payer: BC Managed Care – PPO | Admitting: Speech Pathology

## 2015-08-05 ENCOUNTER — Encounter: Payer: Self-pay | Admitting: Physical Therapy

## 2015-08-05 ENCOUNTER — Ambulatory Visit: Payer: BC Managed Care – PPO | Admitting: Physical Therapy

## 2015-08-05 ENCOUNTER — Ambulatory Visit: Payer: BC Managed Care – PPO | Admitting: Occupational Therapy

## 2015-08-05 DIAGNOSIS — M6281 Muscle weakness (generalized): Secondary | ICD-10-CM | POA: Diagnosis not present

## 2015-08-05 DIAGNOSIS — R278 Other lack of coordination: Secondary | ICD-10-CM

## 2015-08-05 NOTE — Patient Instructions (Signed)
OT TREATMENT    Neuro muscular re-education: Pt. worked grasping 1/2" pegs with thumb and 2nd digit. Emphasis was placed on grasp movements incorporating the distal pads of the 2nd digit and thumb. Pt. worked on isolating the second digit when placing the pegs down. Pt. Has difficulty isolating the 2nd digit while flexing the 3rd through 5th digits. Pt. Also worked on flipping the pegs within her hand without proximal compensation. Pt. worked on translatory movements of the hand with thumb and 2nd digits working individually, and together. [facility] tasks were performed in preparation for handling ADL objects. Reviewed HEP for fisting.

## 2015-08-05 NOTE — Therapy (Signed)
[place] MAIN [facility] SERVICES [address] [place], [place], [address] Phone: [phone]   Fax:  [phone]  Occupational Therapy Treatment  Patient Details  Name: [name] [name] MRN: [id number] Date of Birth: [birth year] Referring Provider: Read Drivers  Encounter Date: 08/05/2015      OT End of Session - 08/05/15 1405    Visit Number 8   Number of Visits 30   Date for OT Re-Evaluation 10/03/15   Authorization Type 30 visit limit   OT Start Time 1345   OT Stop Time 1430   OT Time Calculation (min) 45 min   Activity Tolerance Patient tolerated treatment well   Behavior During Therapy [facility] for tasks assessed/performed      Past Medical History  Diagnosis Date  . Hypothyroidism   . Seizures ([place])   . Adenopathy   . Scoliosis   . Spleen anomaly   . Anemia   . Sjogren's disease ([place])   . Raynaud disease   . Shingles   . Osteopenia     Past Surgical History  Procedure Laterality Date  . Tonsillectomy    . Breast cyst aspiration Right 1990's    neg    There were no vitals filed for this visit.      Subjective Assessment - 08/05/15 1349    Subjective  Pt. reports her hand swelling is better today. Pt. reports elevating the UE on pillows at night.   Pertinent History [name] [name] is a 60 y.o.right handed female with history of Sjogrens disease/rheumatoid arthritis, noted history of seizure but not on antiepileptic meds and dysphagia. Patient diagnosed 4 days prior with an placed on Tamiflu. Lives with spouse independent prior to admission. 2 level home with bedroom on main level. 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 Presented 05/29/2015 with right side weakness and gait instability. Patient with severe restrictive airway distress when he presented to the ED with oxygen saturations in the 70s requiring nonrebreathing mask and later intubated with slow extubation follow-up per critical care pulmonary services.  Chest x-ray showed large right lung consolidative changes. CT angiogram chest no evidence of pulmonary emboli. Cranial CT scan negative. 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 Echocardiogram with ejection fraction of 65% no wall motion abnormalities. 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 EEG was negative for seizure. Patient did not receive TPA. Blood cultures showed group A strep pansensitive 05/30/2015 and currently maintained on Rocephin with droplet contact precautions   Patient Stated Goals To regain use of her right hand.   Currently in Pain? No/denies   Pain Score 0-No pain      OT TREATMENT    Neuro muscular re-education: Pt. worked grasping 1/2" pegs with thumb and 2nd digit. Emphasis was placed on grasp movements incorporating the distal pads of the 2nd digit and thumb. Pt. worked on isolating the second digit when placing the pegs down. Pt. Has difficulty isolating the 2nd digit while flexing the 3rd through 5th digits. Pt. Also worked on flipping the pegs within her hand without proximal compensation. Pt. worked on translatory movements of the hand with thumb and 2nd digits working individually, and together. [facility] tasks were performed in preparation for handling ADL objects. Reviewed HEP for fisting.                          OT Education - 08/05/15 1404  Education provided Yes   Person(s) Educated Patient   Methods Explanation   Comprehension Verbalized understanding             OT Long Term Goals - 07/28/15 1421    OT LONG TERM GOAL #1   Title Pt. will improve right shoulder flexion strength by 2 mm grades to assist with reaching into cabinetry.   Baseline Pt. is unable   Time 12   Period Weeks   Status On-going   OT LONG TERM GOAL #2   Title Pt. will improve right grip fisting with digit flexion to [facility] improved by 2 cm in preparation for grasping/holding ADL grooming and feeding  items.   Baseline limited digit fisting flexion to [facility].   Time 12   Period Weeks   Status On-going   OT LONG TERM GOAL #3   Title Pt. will improve right elbow flexion and extension strength by 2 mm grades to be able to apply deoderant and wash left axilla region.   Baseline Pt. requires assist to apply deoderant and wash left axilla.   Time 12   Period Weeks   Status On-going   OT LONG TERM GOAL #4   Title Pt. will improve right shoulder abduction by 10 degrees to assist with UE dressing.   Baseline Pt. requires assist   Time 12   Period Weeks   Status On-going   OT LONG TERM GOAL #5   Title Pt. will be independent with light meal prep.   Baseline Pt. requires assist   Time 12   Period Weeks   Status On-going               Plan - 08/05/15 1406    Clinical Impression Statement Pt. is improving with right thumb IP flexion/extension movements. Pt. contnues to require work on improving grasping with the thumb and 2nd digit.      OT Frequency 2x / week   OT Duration 12 weeks   OT Treatment/Interventions Self-care/ADL training;Electrical Stimulation;Moist Heat;Manual Therapy;DME and/or AE instruction;Therapeutic activities;Passive range of motion;Ultrasound;Therapeutic exercises;Patient/family education;Neuromuscular education;Therapeutic exercise;Visual/perceptual remediation/compensation;Balance training   Consulted and Agree with Plan of Care Patient      Patient will benefit from skilled therapeutic intervention in order to improve the following deficits and impairments:  Decreased balance, Decreased strength, Decreased coordination, Decreased activity tolerance, Decreased range of motion, Impaired UE functional use, Decreased knowledge of use of DME, Decreased endurance, Impaired tone  Visit Diagnosis: Other lack of coordination    Problem List Patient Active Problem List   Diagnosis Date Noted  . Acute lower UTI   . Ischemic stroke of frontal lobe ([place]) 06/16/2015   . Dysphagia as late effect of cerebrovascular disease   . Hemiparesis affecting dominant side as late effect of stroke ([place])   . Esophageal stricture   . Bacteremia   . Atrial flutter ([place])   . Rheumatoid arthritis ([place])   . Hyperglycemia   . Thyroid activity decreased   . Infection due to Streptococcus pyogenes 06/12/2015  . Severe sepsis with septic shock ([place]) 06/12/2015  . Hematuria 06/12/2015  . Dysphagia 06/12/2015  . 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 CVA (cerebral infarction)   . Acute respiratory failure ([place])   . Encounter for diagnostic procedure   . Ventilator dependence ([place])   . Carotid thromboses   . HLD (hyperlipidemia)   . Respiratory failure ([place])   . Stroke (cerebrum) ([place])   . Research study patient   . Endotracheal tube present   .  Endotracheally intubated   . Encounter for feeding tube placement   . Altered mental state   . Weakness   . Sepsis ([place]) 05/30/2015  . CAP (community acquired pneumonia)   . Encephalopathy   . Encounter for central line placement   . Hypoxia   . Lactic acidosis    Harrel Carina, MS, OTR/L  Harrel Carina 08/05/2015, 2:54 PM  [place] MAIN [facility] SERVICES [address] [place], [place], [address] Phone: [phone]   Fax:  [phone]  Name: [name] [name] MRN: [id number] Date of Birth: 1955/08/09

## 2015-08-05 NOTE — Therapy (Signed)
[place] MAIN [facility] SERVICES [address] [place], [place], [address] Phone: [phone]   Fax:  [phone]  Physical Therapy Treatment  Patient Details  Name: [name] [name] MRN: [id number] Date of Birth: August 02, 1955 Referring Provider: Charlett Blake  Encounter Date: 08/05/2015      PT End of Session - 08/05/15 1355    Visit Number 4   Number of Visits 8   Date for PT Re-Evaluation 08-20-2015   Authorization Type g codes   PT Start Time 0115   PT Stop Time 0145   PT Time Calculation (min) 30 min   Equipment Utilized During Treatment Gait belt   Activity Tolerance Patient tolerated treatment well   Behavior During Therapy [facility] for tasks assessed/performed      Past Medical History  Diagnosis Date  . Hypothyroidism   . Seizures ([employer])   . Adenopathy   . Scoliosis   . Spleen anomaly   . Anemia   . Sjogren's disease ([place])   . Raynaud disease   . Shingles   . Osteopenia     Past Surgical History  Procedure Laterality Date  . Tonsillectomy    . Breast cyst aspiration Right 1990's    neg    There were no vitals filed for this visit.      Subjective Assessment - 08/05/15 1354    Subjective Patient had her g tube out. without complications or any reports of pain.    Currently in Pain? No/denies      Therapeutic exercise: Machine knee flex/extension plate 2/1 Leg press level 4 Biceps/triceps level 2=1/2 Scapula retraction level 2 Lat pull down plate 2  All exercises 15 x 2 sets Patient needs occasional verbal cueing to improve posture and cueing to correctly perform exercises slowly, holding at end of range to increase motor firing of desired muscle to encourage fatigue.                            PT Education - 08/05/15 1354    Education provided Yes   Education Details Reviewed gym exercises for HEP   Person(s) Educated Patient   Methods Explanation   Comprehension Verbalized  understanding             PT Long Term Goals - 07/05/15 1613    PT LONG TERM GOAL #1   Title Patient will be independent in home exercise program to improve strength/mobility for better functional independence with ADLs   Time 1   Period Months   Status New   PT LONG TERM GOAL #2   Title Patient will increase six minute walk test distance to >1000 for progression to community ambulator and improve gait ability   Time 1   Period Months   Status New               Plan - 08/05/15 1355    Clinical Impression Statement Patient is able to perform gym exercises wiht machines wihtout pain behaviors.    Rehab Potential Excellent   PT Frequency 2x / week   PT Duration 4 weeks   PT Treatment/Interventions Therapeutic exercise;Gait training;Therapeutic activities   PT Next Visit Plan HEP progression and strengthenign   Consulted and Agree with Plan of Care Patient      Patient will benefit from skilled therapeutic intervention in order to improve the following deficits and impairments:  Decreased activity tolerance, Decreased endurance, Decreased strength  Visit Diagnosis:  Muscle weakness (generalized)     Problem List Patient Active Problem List   Diagnosis Date Noted  . Acute lower UTI   . Ischemic stroke of frontal lobe ([place]) 06/16/2015  . Dysphagia as late effect of cerebrovascular disease   . Hemiparesis affecting dominant side as late effect of stroke ([place])   . Esophageal stricture   . Bacteremia   . Atrial flutter ([place])   . Rheumatoid arthritis ([place])   . Hyperglycemia   . Thyroid activity decreased   . Infection due to Streptococcus pyogenes 06/12/2015  . Severe sepsis with septic shock ([place]) 06/12/2015  . Hematuria 06/12/2015  . Dysphagia 06/12/2015  . 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 CVA (cerebral infarction)   . Acute respiratory failure ([place])   . Encounter for diagnostic procedure   . Ventilator dependence ([place])   . Carotid thromboses   . HLD  (hyperlipidemia)   . Respiratory failure ([place])   . Stroke (cerebrum) ([place])   . Research study patient   . Endotracheal tube present   . Endotracheally intubated   . Encounter for feeding tube placement   . Altered mental state   . Weakness   . Sepsis ([place]) 05/30/2015  . CAP (community acquired pneumonia)   . Encephalopathy   . Encounter for central line placement   . Hypoxia   . Lactic acidosis    Alanson Puls, PT, DPT [place], [place] S 08/05/2015, 1:57 PM  [place] MAIN [facility] SERVICES [address] [place], [place], [address] Phone: [phone]   Fax:  [phone]  Name: [name] [name] MRN: [id number] Date of Birth: 04-18-1955

## 2015-08-09 ENCOUNTER — Encounter: Payer: Self-pay | Admitting: Physical Medicine & Rehabilitation

## 2015-08-09 ENCOUNTER — Ambulatory Visit (HOSPITAL_BASED_OUTPATIENT_CLINIC_OR_DEPARTMENT_OTHER): Payer: BC Managed Care – PPO | Admitting: Physical Medicine & Rehabilitation

## 2015-08-09 ENCOUNTER — Encounter: Payer: BC Managed Care – PPO | Attending: Physical Medicine & Rehabilitation

## 2015-08-09 VITALS — BP 117/65 | HR 84

## 2015-08-09 DIAGNOSIS — Z9889 Other specified postprocedural states: Secondary | ICD-10-CM | POA: Insufficient documentation

## 2015-08-09 DIAGNOSIS — M8580 Other specified disorders of bone density and structure, unspecified site: Secondary | ICD-10-CM | POA: Insufficient documentation

## 2015-08-09 DIAGNOSIS — R1310 Dysphagia, unspecified: Secondary | ICD-10-CM | POA: Diagnosis not present

## 2015-08-09 DIAGNOSIS — R531 Weakness: Secondary | ICD-10-CM | POA: Insufficient documentation

## 2015-08-09 DIAGNOSIS — I69991 Dysphagia following unspecified cerebrovascular disease: Secondary | ICD-10-CM | POA: Diagnosis not present

## 2015-08-09 DIAGNOSIS — M069 Rheumatoid arthritis, unspecified: Secondary | ICD-10-CM | POA: Diagnosis not present

## 2015-08-09 DIAGNOSIS — E785 Hyperlipidemia, unspecified: Secondary | ICD-10-CM | POA: Diagnosis not present

## 2015-08-09 DIAGNOSIS — I7300 Raynaud's syndrome without gangrene: Secondary | ICD-10-CM | POA: Insufficient documentation

## 2015-08-09 DIAGNOSIS — E039 Hypothyroidism, unspecified: Secondary | ICD-10-CM | POA: Insufficient documentation

## 2015-08-09 DIAGNOSIS — I4892 Unspecified atrial flutter: Secondary | ICD-10-CM | POA: Insufficient documentation

## 2015-08-09 DIAGNOSIS — R569 Unspecified convulsions: Secondary | ICD-10-CM | POA: Insufficient documentation

## 2015-08-09 DIAGNOSIS — I638 Other cerebral infarction: Secondary | ICD-10-CM | POA: Insufficient documentation

## 2015-08-09 DIAGNOSIS — M3500 Sicca syndrome, unspecified: Secondary | ICD-10-CM | POA: Diagnosis not present

## 2015-08-09 DIAGNOSIS — B998 Other infectious disease: Secondary | ICD-10-CM | POA: Diagnosis not present

## 2015-08-09 DIAGNOSIS — I69359 Hemiplegia and hemiparesis following cerebral infarction affecting unspecified side: Secondary | ICD-10-CM | POA: Diagnosis not present

## 2015-08-09 DIAGNOSIS — K222 Esophageal obstruction: Secondary | ICD-10-CM | POA: Diagnosis not present

## 2015-08-09 NOTE — Progress Notes (Signed)
Subjective:    Patient ID: [name] [name], female    DOB: 1955-09-11, 60 y.o.   MRN: [id number] 60 year old right-handed female with history of Sjogren disease with rheumatoid arthritis.  Recently diagnosed prior with flu, placed on Tamiflu.  She was independent prior to admission, living with her husband.  Presented on May 29, 2015, right-sided weakness and gait instability.  The patient with severe restrictive airway distress when presented to the ED with oxygen saturations in the 70s, requiring non-rebreather mask, later intubated, slow extubation, followup per Critical Care Pulmonary Services.  Chest x- ray showed large right consolidative changes.  CT angiogram of the chest, no evidence of pulmonary emboli.  Cranial CT scan negative.  MRI of the brain showed a small left frontal lobe infarct.  Echocardiogram with ejection fraction of 65%.  No wall motion abnormalities.  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  EEG negative for seizure.  The patient did not receive tPA. Blood culture showed group A strep pansensitive, May 30, 2015, placed on Rocephin.  Hospital course, atrial flutter.  Placed on Lovenox.  The patient asymptomatic.  Low-dose beta-blocker added. Modified barium swallow, June 09, 2015, showed severe pharyngeal dysphagia as well as esophageal stricture.  Underwent gastrostomy tube placement, Interventional Radiology, June 14, 2015.  Plavix and aspirin were resumed after PEG tube placed for CVA prophylaxis.  HPI 60 year old female Discharge from inpatient rehabilitation approximately 6 weeks ago. She has been attending outpatient therapy [place] [facility] OT Her ambulation has improved and is now using a straight cane. Mild balance issues. Her right ankle gets weak with fatigue. She has been very active going out to lunch with her work colleagues has been also going to [employer]. She is not driving. She has  had her gastrostomy tube removed after regaining normal swallowing function.  Pain Inventory Average Pain 8 Pain Right Now 3 My pain is sharp and burning  In the last 24 hours, has pain interfered with the following? General activity 6 Relation with others 7 Enjoyment of life 7 What TIME of day is your pain at its worst? morning and night Sleep (in general) Fair  Pain is worse with: unsure Pain improves with: not answered Relief from Meds: not answered  Mobility walk without assistance use a cane how many minutes can you walk? 20 ability to climb steps?  yes do you drive?  no  Function disabled: date disabled 05/29/15 I need assistance with the following:  dressing, meal prep, household duties and shopping  Neuro/Psych weakness numbness tingling trouble walking confusion anxiety  Prior Studies Any changes since last visit?  no  Physicians involved in your care Any changes since last visit?  no   Family History  Problem Relation Age of Onset  . Breast cancer Maternal Grandfather 65   Social History   Social History  . Marital Status: Married    Spouse Name: N/A  . Number of Children: N/A  . Years of Education: N/A   Social History Main Topics  . Smoking status: Never Smoker   . Smokeless tobacco: Never Used  . Alcohol Use: No  . Drug Use: No  . Sexual Activity: Not Asked   Other Topics Concern  . None   Social History Narrative   Past Surgical History  Procedure Laterality Date  . Tonsillectomy    . Breast cyst aspiration Right 1990's    neg   Past Medical History  Diagnosis Date  .  Hypothyroidism   . Seizures ([place])   . Adenopathy   . Scoliosis   . Spleen anomaly   . Anemia   . Sjogren's disease ([place])   . Raynaud disease   . Shingles   . Osteopenia    BP 117/65 mmHg  Pulse 84  SpO2 100%  Opioid Risk Score:   Fall Risk Score:  `1  Depression screen PHQ 2/9  Depression screen [facility] 2/9 08/09/2015 07/12/2015  Decreased Interest 0  0  Down, Depressed, Hopeless 0 0  PHQ - 2 Score 0 0    Review of Systems  Cardiovascular: Positive for leg swelling.  All other systems reviewed and are negative.      Objective:   Physical Exam  Constitutional: She is oriented to person, place, and time. She appears well-developed and well-nourished.  HENT:  Head: Normocephalic and atraumatic.  Eyes: Conjunctivae and EOM are normal. Pupils are equal, round, and reactive to light.  Musculoskeletal:  No pain with neck range of motion negative Spurling's Mild tenderness palpation of the left trapezius. There is normal sensation bilateral upper limbs   Neurological: She is alert and oriented to person, place, and time. She exhibits abnormal muscle tone. Coordination abnormal.  Reflex Scores:      Tricep reflexes are 3+ on the right side and 2+ on the left side.      Bicep reflexes are 3+ on the right side and 2+ on the left side.      Brachioradialis reflexes are 3+ on the right side and 2+ on the left side. Motor strength is 4/5In the right deltoid, biceps, triceps, grip 5 in the right hip flexor knee extensor and fourth right ankle dorsiflexor 5/5 in left deltoid, biceps, triceps, grip, hip flexors, knee extensors, ankle dorsi flexor plantar flexor Reduced coordination the right hand with finger to thumb opposition  Psychiatric: She has a normal mood and affect.  Nursing note and vitals reviewed.         Assessment & Plan:  1. Left frontal lobe infarct  With right upper greater than right lower extremity weakness. She is also had problems with memory attention concentration. She used to work as a Radio producer and at this point it is unlikely she could return to that job in the next year. She has filed for social security disability.  We'll continue with outpatient OT and outpatient PT will be finishing up soon.. She plans to do a general conditioning program as outlined by PT and can do this at the Hospital gym  2.  Intermittent hypotensive episodes I suspect it may be related to her arrhythmia she has a history of atrial flutter. This might be accompanied by decreased cardiac output. She will follow-up with cardiology We discussed that if she develops chest pain with left upper extremity pain again that she should go to the emergency department  3. Left upper extremity intermittent numbness mainly at night I suspect this may be a carpal tunnel syndrome, she had a history of carpal tunnel syndrome on the right side and wore a wrist splint for a while. I recommend her using a wrist splint at night in the left upper extremity  Physical medicine and rehabilitation follow-up 1 month No driving for now but may resume in a month if she continues to make improvements  Over half of the 25 min visit was spent counseling and coordinating care.Answered the husband's questions

## 2015-08-09 NOTE — Patient Instructions (Signed)
I agree with cardiology consultation If you have a repeat episode of chest pain with left arm pain I would recommend going to the emergency department.  The left-sided numbness that you get at night may be related to years sleeping position. Excessive elbow flexion can cause ulnar neuropathy, excessive wrist flexion could cause median neuropathy keeping the elbow and wrist as straight as possible may be helpful. You may consider a left wrist splint

## 2015-08-10 ENCOUNTER — Ambulatory Visit: Payer: BC Managed Care – PPO | Admitting: Occupational Therapy

## 2015-08-10 ENCOUNTER — Ambulatory Visit: Payer: BC Managed Care – PPO | Admitting: Physical Therapy

## 2015-08-10 ENCOUNTER — Encounter: Payer: BC Managed Care – PPO | Admitting: Speech Pathology

## 2015-08-10 DIAGNOSIS — R279 Unspecified lack of coordination: Secondary | ICD-10-CM

## 2015-08-10 DIAGNOSIS — R278 Other lack of coordination: Secondary | ICD-10-CM

## 2015-08-10 DIAGNOSIS — M6281 Muscle weakness (generalized): Secondary | ICD-10-CM | POA: Diagnosis not present

## 2015-08-10 NOTE — Patient Instructions (Signed)
OT TREATMENT    Neuro muscular re-education: Pt. Performed grasping 1" cubes with the thumb to 2nd digit with resistance. Emphasis was placed on thumb IP extension, and the position of the thumb during grasping.  The position of the board was placed at tabletop, and vertical angles to incorporate wrist extension.  Therapeutic Exercise: Pt. Worked on 3pt. pinch grasping with yellow resistive clips. Emphasis was placed on the position of the thumb tip and second digit.

## 2015-08-10 NOTE — Therapy (Signed)
[place] MAIN [facility] SERVICES [address] [place], [place], [address] Phone: [phone]   Fax:  [phone]  Physical Therapy Treatment  Patient Details  Name: [name] [name] MRN: [id number] Date of Birth: 1955/11/14 Referring Provider: Charlett Blake  Encounter Date: 08/10/2015      PT End of Session - 08/10/15 1641    Visit Number 5   Number of Visits 8   Date for PT Re-Evaluation 08-27-15   Authorization Type g codes   PT Start Time 0400   PT Stop Time 0445   PT Time Calculation (min) 45 min   Behavior During Therapy [facility] for tasks assessed/performed      Past Medical History  Diagnosis Date  . Hypothyroidism   . Seizures ([place])   . Adenopathy   . Scoliosis   . Spleen anomaly   . Anemia   . Sjogren's disease ([place])   . Raynaud disease   . Shingles   . Osteopenia     Past Surgical History  Procedure Laterality Date  . Tonsillectomy    . Breast cyst aspiration Right 1990's    neg    There were no vitals filed for this visit.      Subjective Assessment - 08/10/15 1617    Subjective Patient is feeling better but did have a chest pain and left arm pain that went away after an hour. She had an apt scheduled wiht the MD.    Currently in Pain? No/denies    Neuromuscular training; disk tandem with trunk rotation and head turns Blue foam step ups x 20  blue foam tapping on 6 inch stool x 20 x 2 TM 2. 0 miles / hour x 10 mins Side stepping . 3 miles / hour left and right 4 mins  4 square diagonals  X 20  Star stepping with single leg x 20 Mod verbal cues used throughout with increased in postural sway and LOB most seen with narrow base of support and while on uneven surfaces. Continues to have balance deficits typical with diagnosis. Patient performs advanced level exercises without pain behaviors and needs verbal cuing for postural alignment and head positioning                                 PT Education - 08/10/15 1640    Education provided Yes   Education Details HEP   Person(s) Educated Patient   Methods Explanation   Comprehension Verbalized understanding             PT Long Term Goals - 07/05/15 1613    PT LONG TERM GOAL #1   Title Patient will be independent in home exercise program to improve strength/mobility for better functional independence with ADLs   Time 1   Period Months   Status New   PT LONG TERM GOAL #2   Title Patient will increase six minute walk test distance to >1000 for progression to community ambulator and improve gait ability   Time 1   Period Months   Status New               Plan - 08/10/15 1641    Clinical Impression Statement Patient performs dynamic standing balance activities wiht CGA and some fatigue and loss of balance with trunk rotation.    Rehab Potential Excellent   PT Frequency 2x / week   PT Duration 4 weeks   PT  Treatment/Interventions Therapeutic exercise;Gait training;Therapeutic activities   PT Next Visit Plan HEP progression and strengthenign   Consulted and Agree with Plan of Care Patient      Patient will benefit from skilled therapeutic intervention in order to improve the following deficits and impairments:  Decreased activity tolerance, Decreased endurance, Decreased strength  Visit Diagnosis: Muscle weakness (generalized)  Unspecified lack of coordination     Problem List Patient Active Problem List   Diagnosis Date Noted  . Acute lower UTI   . Ischemic stroke of frontal lobe ([place]) 06/16/2015  . Dysphagia as late effect of cerebrovascular disease   . Hemiparesis affecting dominant side as late effect of stroke ([place])   . Esophageal stricture   . Bacteremia   . Atrial flutter ([place])   . Rheumatoid arthritis ([place])   . Hyperglycemia   . Thyroid activity decreased   . Infection due to Streptococcus pyogenes 06/12/2015  . Severe sepsis with septic shock ([place]) 06/12/2015  . Hematuria  06/12/2015  . Dysphagia 06/12/2015  . 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 CVA (cerebral infarction)   . Acute respiratory failure ([place])   . Encounter for diagnostic procedure   . Ventilator dependence ([place])   . Carotid thromboses   . HLD (hyperlipidemia)   . Respiratory failure ([place])   . Stroke (cerebrum) ([place])   . Research study patient   . Endotracheal tube present   . Endotracheally intubated   . Encounter for feeding tube placement   . Altered mental state   . Weakness   . Sepsis ([place]) 05/30/2015  . CAP (community acquired pneumonia)   . Encephalopathy   . Encounter for central line placement   . Hypoxia   . Lactic acidosis   Alanson Puls, PT, DPT  Arelia Sneddon S 08/10/2015, 4:57 PM  [place] MAIN [facility] SERVICES [address] [place], [place], [address] Phone: [phone]   Fax:  [phone]  Name: [name] [name] MRN: [id number] Date of Birth: November 14, 1955

## 2015-08-10 NOTE — Therapy (Signed)
[place] MAIN [facility] SERVICES [address] [place], [place], [address] Phone: [phone]   Fax:  [phone]  Occupational Therapy Treatment  Patient Details  Name: [name] [name] MRN: [id number] Date of Birth: 1956/01/03 Referring Provider: Read Drivers  Encounter Date: 08/10/2015      OT End of Session - 08/10/15 1715    Visit Number 9   Number of Visits 30   Date for OT Re-Evaluation 10/03/15   Authorization Type 30 visit limit   OT Start Time 1520   OT Stop Time 1558   OT Time Calculation (min) 38 min   Activity Tolerance Patient tolerated treatment well   Behavior During Therapy [name] for tasks assessed/performed      Past Medical History  Diagnosis Date  . Hypothyroidism   . Seizures ([place])   . Adenopathy   . Scoliosis   . Spleen anomaly   . Anemia   . Sjogren's disease ([place])   . Raynaud disease   . Shingles   . Osteopenia     Past Surgical History  Procedure Laterality Date  . Tonsillectomy    . Breast cyst aspiration Right 1990's    neg    There were no vitals filed for this visit.      Subjective Assessment - 08/10/15 1714    Subjective  Pt. reports having had an episode of left arm and chest pain several nights ago.   Pertinent History [name] [name] is a 60 y.o.right handed female with history of Sjogrens disease/rheumatoid arthritis, noted history of seizure but not on antiepileptic meds and dysphagia. Patient diagnosed 4 days prior with an placed on Tamiflu. Lives with spouse independent prior to admission. 2 level home with bedroom on main level. 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 Presented 05/29/2015 with right side weakness and gait instability. Patient with severe restrictive airway distress when he presented to the ED with oxygen saturations in the 70s requiring nonrebreathing mask and later intubated with slow extubation follow-up per critical care pulmonary services. Chest x-ray showed  large right lung consolidative changes. CT angiogram chest no evidence of pulmonary emboli. Cranial CT scan negative. 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 Echocardiogram with ejection fraction of 65% no wall motion abnormalities. 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 EEG was negative for seizure. Patient did not receive TPA. Blood cultures showed group A strep pansensitive 05/30/2015 and currently maintained on Rocephin with droplet contact precautions   Patient Stated Goals To regain use of her right hand.   Currently in Pain? No/denies   Pain Score 0-No pain      OT TREATMENT    Neuro muscular re-education: Pt. Performed grasping 1" cubes with the thumb to 2nd digit with resistance. Emphasis was placed on thumb IP extension, and the position of the thumb during grasping.  The position of the board was placed at tabletop, and vertical angles to incorporate wrist extension.  Therapeutic Exercise: Pt. Worked on 3pt. pinch grasping with yellow resistive clips. Emphasis was placed on the position of the thumb tip and second digit.                          OT Education - 08/10/15 1715    Education provided Yes   Person(s) Educated Patient   Methods Explanation   Comprehension Verbalized understanding  OT Long Term Goals - 07/28/15 1421    OT LONG TERM GOAL #1   Title Pt. will improve right shoulder flexion strength by 2 mm grades to assist with reaching into cabinetry.   Baseline Pt. is unable   Time 12   Period Weeks   Status On-going   OT LONG TERM GOAL #2   Title Pt. will improve right grip fisting with digit flexion to [facility] improved by 2 cm in preparation for grasping/holding ADL grooming and feeding items.   Baseline limited digit fisting flexion to [facility].   Time 12   Period Weeks   Status On-going   OT LONG TERM GOAL #3   Title Pt. will improve right elbow flexion and  extension strength by 2 mm grades to be able to apply deoderant and wash left axilla region.   Baseline Pt. requires assist to apply deoderant and wash left axilla.   Time 12   Period Weeks   Status On-going   OT LONG TERM GOAL #4   Title Pt. will improve right shoulder abduction by 10 degrees to assist with UE dressing.   Baseline Pt. requires assist   Time 12   Period Weeks   Status On-going   OT LONG TERM GOAL #5   Title Pt. will be independent with light meal prep.   Baseline Pt. requires assist   Time 12   Period Weeks   Status On-going               Plan - 08/10/15 1717    Clinical Impression Statement Pt. is improving with right hand gross fisting. Pt. continues to perform grasping with the thumb to 2nd digit through with improved thumb IP extension with cues. Pt. with difficulty performing thumb opposition to 3rd through 5th  digits.the 5th digit.    OT Frequency 2x / week   OT Duration 12 weeks   OT Treatment/Interventions Self-care/ADL training;Electrical Stimulation;Moist Heat;Manual Therapy;DME and/or AE instruction;Therapeutic activities;Passive range of motion;Ultrasound;Therapeutic exercises;Patient/family education;Neuromuscular education;Therapeutic exercise;Visual/perceptual remediation/compensation;Balance training   Consulted and Agree with Plan of Care Patient      Patient will benefit from skilled therapeutic intervention in order to improve the following deficits and impairments:  Decreased balance, Decreased strength, Decreased coordination, Decreased activity tolerance, Decreased range of motion, Impaired UE functional use, Decreased knowledge of use of DME, Decreased endurance, Impaired tone  Visit Diagnosis: Other lack of coordination  Muscle weakness (generalized)    Problem List Patient Active Problem List   Diagnosis Date Noted  . Acute lower UTI   . Ischemic stroke of frontal lobe ([place]) 06/16/2015  . Dysphagia as late effect of  cerebrovascular disease   . Hemiparesis affecting dominant side as late effect of stroke ([place])   . Esophageal stricture   . Bacteremia   . Atrial flutter ([place])   . Rheumatoid arthritis ([place])   . Hyperglycemia   . Thyroid activity decreased   . Infection due to Streptococcus pyogenes 06/12/2015  . Severe sepsis with septic shock ([place]) 06/12/2015  . Hematuria 06/12/2015  . Dysphagia 06/12/2015  . 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 CVA (cerebral infarction)   . Acute respiratory failure ([place])   . Encounter for diagnostic procedure   . Ventilator dependence ([place])   . Carotid thromboses   . HLD (hyperlipidemia)   . Respiratory failure ([place])   . Stroke (cerebrum) ([place])   . Research study patient   . Endotracheal tube present   . Endotracheally intubated   . Encounter  for feeding tube placement   . Altered mental state   . Weakness   . Sepsis ([place]) 05/30/2015  . CAP (community acquired pneumonia)   . Encephalopathy   . Encounter for central line placement   . Hypoxia   . Lactic acidosis    Harrel Carina, MS, OTR/L  Harrel Carina 08/10/2015, 5:31 PM  [place] MAIN [facility] SERVICES [address] [place], [place], [address] Phone: [phone]   Fax:  [phone]  Name: [name] [name] MRN: [id number] Date of Birth: 25-Mar-1956

## 2015-08-12 ENCOUNTER — Encounter: Payer: Self-pay | Admitting: Physical Therapy

## 2015-08-12 ENCOUNTER — Ambulatory Visit: Payer: BC Managed Care – PPO | Admitting: Physical Therapy

## 2015-08-12 ENCOUNTER — Ambulatory Visit: Payer: BC Managed Care – PPO | Admitting: Occupational Therapy

## 2015-08-12 ENCOUNTER — Encounter: Payer: BC Managed Care – PPO | Admitting: Speech Pathology

## 2015-08-12 DIAGNOSIS — R278 Other lack of coordination: Secondary | ICD-10-CM

## 2015-08-12 DIAGNOSIS — M6281 Muscle weakness (generalized): Secondary | ICD-10-CM | POA: Diagnosis not present

## 2015-08-12 NOTE — Therapy (Signed)
[place] MAIN [facility] SERVICES [address] [place], [place], [address] Phone: [phone]   Fax:  [phone]  Physical Therapy Treatment  Patient Details  Name: [name] [name] MRN: [id number] Date of Birth: 04/27/1955 Referring Provider: Charlett Blake  Encounter Date: 08/12/2015      PT End of Session - 08/12/15 1315    Visit Number 6   Number of Visits 8   Date for PT Re-Evaluation 2015-08-20   Authorization Type g codes   PT Start Time 0100   PT Stop Time 0145   PT Time Calculation (min) 45 min   Behavior During Therapy [facility] for tasks assessed/performed      Past Medical History  Diagnosis Date  . Hypothyroidism   . Seizures ([place])   . Adenopathy   . Scoliosis   . Spleen anomaly   . Anemia   . Sjogren's disease ([place])   . Raynaud disease   . Shingles   . Osteopenia     Past Surgical History  Procedure Laterality Date  . Tonsillectomy    . Breast cyst aspiration Right 1990's    neg    There were no vitals filed for this visit.      Subjective Assessment - 08/12/15 1314    Subjective Patient is feeling fine today.   Currently in Pain? No/denies     BP: 104/49  TM walking 2. 0 miles/hour x 10 minutes; patients RUE became tingling and mild chest tingling that went away with rest Leg press with 75 lbs x 20 x 3, heel raises x 20 x 3 Knee extension/ flexion in machine x 15 x 2 Patient needs occasional verbal cueing to improve posture and cueing to correctly perform exercises slowly.                           PT Education - 08/12/15 1314    Education provided Yes   Education Details HEP   Person(s) Educated Patient   Methods Explanation   Comprehension Verbalized understanding             PT Long Term Goals - 07/05/15 1613    PT LONG TERM GOAL #1   Title Patient will be independent in home exercise program to improve strength/mobility for better functional independence with ADLs   Time 1   Period Months   Status New   PT LONG TERM GOAL #2   Title Patient will increase six minute walk test distance to >1000 for progression to community ambulator and improve gait ability   Time 1   Period Months   Status New               Plan - 08/12/15 1315    Clinical Impression Statement Patient is able tp perform strengthening and balance exercises without pain behaviors.    Rehab Potential Excellent   PT Frequency 2x / week   PT Duration 4 weeks   PT Treatment/Interventions Therapeutic exercise;Gait training;Therapeutic activities   PT Next Visit Plan HEP progression and strengthenign      Patient will benefit from skilled therapeutic intervention in order to improve the following deficits and impairments:  Decreased activity tolerance, Decreased endurance, Decreased strength  Visit Diagnosis: Muscle weakness (generalized)     Problem List Patient Active Problem List   Diagnosis Date Noted  . Acute lower UTI   . Ischemic stroke of frontal lobe ([place]) 06/16/2015  . Dysphagia as late effect  of cerebrovascular disease   . Hemiparesis affecting dominant side as late effect of stroke ([place])   . Esophageal stricture   . Bacteremia   . Atrial flutter ([place])   . Rheumatoid arthritis ([place])   . Hyperglycemia   . Thyroid activity decreased   . Infection due to Streptococcus pyogenes 06/12/2015  . Severe sepsis with septic shock ([place]) 06/12/2015  . Hematuria 06/12/2015  . Dysphagia 06/12/2015  . 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 CVA (cerebral infarction)   . Acute respiratory failure ([place])   . Encounter for diagnostic procedure   . Ventilator dependence ([place])   . Carotid thromboses   . HLD (hyperlipidemia)   . Respiratory failure ([place])   . Stroke (cerebrum) ([place])   . Research study patient   . Endotracheal tube present   . Endotracheally intubated   . Encounter for feeding tube placement   . Altered mental state   . Weakness   . Sepsis ([place])  05/30/2015  . CAP (community acquired pneumonia)   . Encephalopathy   . Encounter for central line placement   . Hypoxia   . Lactic acidosis    Alanson Puls, PT, DPT [place] S 08/12/2015, 1:50 PM  [place] MAIN [facility] SERVICES [address] [place], [place], [address] Phone: [phone]   Fax:  [phone]  Name: Taye Maack MRN: [id number] Date of Birth: [birth year]

## 2015-08-12 NOTE — Patient Instructions (Signed)
OT TREATMENT    Neuro muscular re-education:Pt. Worked on improving grasping  with resistive clips with emphasis on the position of the thumb on the clip. Pt. Worked on grasping 1" cubes, and alternating weightbearing to normalize tone, pt. Worked on grasping 1/3" beads with the distal digit pads of the 2nd digit and thumb. Pt. Worked on translatory movements of the hand with the thumb and digits separately using a 1" ball with resistive tape to improve inner hand movements. Pt. Worked on moving 2 objects of varying sizes within her hand.  Therapeutic Exercise:  Selfcare:  Manual Therapy:

## 2015-08-12 NOTE — Therapy (Signed)
[place] MAIN [facility] SERVICES [address] [place], [place], [address] Phone: [phone]   Fax:  [phone]  Occupational Therapy Treatment  Patient Details  Name: [name] [name] MRN: [id number] Date of Birth: 1955/08/07 Referring Provider: Read Drivers  Encounter Date: 08/12/2015      OT End of Session - 08/12/15 1727    Visit Number 10   Number of Visits 30   Date for OT Re-Evaluation 10/03/15   Authorization Type 30 visit limit   OT Start Time 1355   OT Stop Time 1430   OT Time Calculation (min) 35 min   Activity Tolerance Patient tolerated treatment well   Behavior During Therapy [facility] for tasks assessed/performed      Past Medical History  Diagnosis Date  . Hypothyroidism   . Seizures ([place])   . Adenopathy   . Scoliosis   . Spleen anomaly   . Anemia   . Sjogren's disease ([place])   . Raynaud disease   . Shingles   . Osteopenia     Past Surgical History  Procedure Laterality Date  . Tonsillectomy    . Breast cyst aspiration Right 1990's    neg    There were no vitals filed for this visit.      Subjective Assessment - 08/12/15 1419    Subjective  Pt. reports she continues to have LUE pain in the mornings, Pt. has a follow up appointment with her pcp and cardiologist this week.   Pertinent History [name] [name] is a 60 y.o.right handed female with history of Sjogrens disease/rheumatoid arthritis, noted history of seizure but not on antiepileptic meds and dysphagia. Patient diagnosed 4 days prior with an placed on Tamiflu. Lives with spouse independent prior to admission. 2 level home with bedroom on main level. 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 Presented 05/29/2015 with right side weakness and gait instability. Patient with severe restrictive airway distress when he presented to the ED with oxygen saturations in the 70s requiring nonrebreathing mask and later intubated with slow extubation follow-up per  critical care pulmonary services. Chest x-ray showed large right lung consolidative changes. CT angiogram chest no evidence of pulmonary emboli. Cranial CT scan negative. 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 Echocardiogram with ejection fraction of 65% no wall motion abnormalities. 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 EEG was negative for seizure. Patient did not receive TPA. Blood cultures showed group A strep pansensitive 05/30/2015 and currently maintained on Rocephin with droplet contact precautions   Patient Stated Goals To regain use of her right hand.   Currently in Pain? Yes   Pain Score 3    Pain Location Arm   Pain Orientation Left        OT TREATMENT    Neuro muscular re-education:Pt. Worked on improving grasping  with resistive clips with emphasis on the position of the thumb on the clip. Pt. Worked on grasping 1" cubes, and alternating weightbearing to normalize tone, pt. Worked on grasping 1/3" beads with the distal digit pads of the 2nd digit and thumb. Pt. Worked on translatory movements of the hand with the thumb and digits separately using a 1" ball with resistive tape to improve inner hand movements. Pt. Worked on moving 2 objects of varying sizes within her hand.  OT Education - 08/12/15 1727    Education provided Yes   Person(s) Educated Patient   Methods Explanation   Comprehension Verbalized understanding             OT Long Term Goals - 07/28/15 1421    OT LONG TERM GOAL #1   Title Pt. will improve right shoulder flexion strength by 2 mm grades to assist with reaching into cabinetry.   Baseline Pt. is unable   Time 12   Period Weeks   Status On-going   OT LONG TERM GOAL #2   Title Pt. will improve right grip fisting with digit flexion to [facility] improved by 2 cm in preparation for grasping/holding ADL grooming and feeding items.   Baseline limited  digit fisting flexion to [facility].   Time 12   Period Weeks   Status On-going   OT LONG TERM GOAL #3   Title Pt. will improve right elbow flexion and extension strength by 2 mm grades to be able to apply deoderant and wash left axilla region.   Baseline Pt. requires assist to apply deoderant and wash left axilla.   Time 12   Period Weeks   Status On-going   OT LONG TERM GOAL #4   Title Pt. will improve right shoulder abduction by 10 degrees to assist with UE dressing.   Baseline Pt. requires assist   Time 12   Period Weeks   Status On-going   OT LONG TERM GOAL #5   Title Pt. will be independent with light meal prep.   Baseline Pt. requires assist   Time 12   Period Weeks   Status On-going               Plan - 08/12/15 1728    Clinical Impression Statement Pt. continues to improve with right hand fisting, however continues to work towards achieving a full composite fist. Pt. is improving with thumb movements, and translatory movements of the hand, and thumb. Pt. continues to need  to work on improving  islating 2nd digit during tasks.   Rehab Potential Good   OT Frequency 2x / week   OT Duration 12 weeks   OT Treatment/Interventions Self-care/ADL training;Electrical Stimulation;Moist Heat;Manual Therapy;DME and/or AE instruction;Therapeutic activities;Passive range of motion;Ultrasound;Therapeutic exercises;Patient/family education;Neuromuscular education;Therapeutic exercise;Visual/perceptual remediation/compensation;Balance training   Consulted and Agree with Plan of Care Patient      Patient will benefit from skilled therapeutic intervention in order to improve the following deficits and impairments:  Decreased balance, Decreased strength, Decreased coordination, Decreased activity tolerance, Decreased range of motion, Impaired UE functional use, Decreased knowledge of use of DME, Decreased endurance, Impaired tone  Visit Diagnosis: No diagnosis found.    Problem  List Patient Active Problem List   Diagnosis Date Noted  . Acute lower UTI   . Ischemic stroke of frontal lobe ([place]) 06/16/2015  . Dysphagia as late effect of cerebrovascular disease   . Hemiparesis affecting dominant side as late effect of stroke ([place])   . Esophageal stricture   . Bacteremia   . Atrial flutter ([place])   . Rheumatoid arthritis ([place])   . Hyperglycemia   . Thyroid activity decreased   . Infection due to Streptococcus pyogenes 06/12/2015  . Severe sepsis with septic shock ([place]) 06/12/2015  . Hematuria 06/12/2015  . Dysphagia 06/12/2015  . 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 CVA (cerebral infarction)   . Acute respiratory failure ([place])   . Encounter for diagnostic procedure   . Ventilator dependence ([place])   .  Carotid thromboses   . HLD (hyperlipidemia)   . Respiratory failure ([place])   . Stroke (cerebrum) ([place])   . Research study patient   . Endotracheal tube present   . Endotracheally intubated   . Encounter for feeding tube placement   . Altered mental state   . Weakness   . Sepsis ([place]) 05/30/2015  . CAP (community acquired pneumonia)   . Encephalopathy   . Encounter for central line placement   . Hypoxia   . Lactic acidosis    Harrel Carina, MS, OTR/L  Harrel Carina 08/12/2015, 5:37 PM  [place] MAIN [facility] SERVICES [address] [place], [place], [address] Phone: [phone]   Fax:  [phone]  Name: Hiilani Haisten MRN: [id number] Date of Birth: [birth year]

## 2015-08-17 ENCOUNTER — Ambulatory Visit: Payer: BC Managed Care – PPO | Attending: Physical Medicine & Rehabilitation | Admitting: Occupational Therapy

## 2015-08-17 DIAGNOSIS — R278 Other lack of coordination: Secondary | ICD-10-CM | POA: Insufficient documentation

## 2015-08-17 DIAGNOSIS — M6281 Muscle weakness (generalized): Secondary | ICD-10-CM | POA: Diagnosis present

## 2015-08-17 DIAGNOSIS — R29810 Facial weakness: Secondary | ICD-10-CM | POA: Insufficient documentation

## 2015-08-17 NOTE — Patient Instructions (Signed)
OT TREATMENT    Neuro muscular re-education: Pt. worked on grasping, reaching, flipping, and stacking 1" pegs with the right hand. Pt. alternated weightbearing with [place]. Pt. worked on lateral, and 3pt. Pinching with resistive clips. Emphasis was placed on placement/alignment of the thumb and 2nd distal digit pads. Pt. worked on grasping 1" cubes with resistance, and placing them with isolating 2nd digit.

## 2015-08-17 NOTE — Therapy (Addendum)
[place] MAIN [facility] SERVICES [address] [place], [place], [address] Phone: [phone]   Fax:  [phone]  Occupational Therapy Treatment/Progress Note  Patient Details  Name: [name] [name] MRN: [id number] Date of Birth: [birth year] Referring Provider: Read Drivers  Encounter Date: 08/17/2015      OT End of Session - 08/17/15 1556    Visit Number 11   Number of Visits 30   Date for OT Re-Evaluation 10/03/15   Authorization Type 30   OT Start Time 1352   OT Stop Time 1437   OT Time Calculation (min) 45 min   Activity Tolerance Patient tolerated treatment well   Behavior During Therapy [facility] for tasks assessed/performed      Past Medical History  Diagnosis Date  . Hypothyroidism   . Seizures ([place])   . Adenopathy   . Scoliosis   . Spleen anomaly   . Anemia   . Sjogren's disease ([place])   . Raynaud disease   . Shingles   . Osteopenia     Past Surgical History  Procedure Laterality Date  . Tonsillectomy    . Breast cyst aspiration Right 1990's    neg    There were no vitals filed for this visit.      Subjective Assessment - 08/17/15 1710    Currently in Pain? No/denies   Pain Score 0-No pain            [facility] OT Assessment - 08/17/15 0001    Coordination   Right 9 Hole Peg Test 40   Left 9 Hole Peg Test 19   AROM   Overall AROM Comments 2nd digit to the distal palmar crease: 2cm   Hand Function   Right Hand Grip (lbs) 8#   Right Hand Lateral Pinch 4 lbs   Right Hand 3 Point Pinch 5 lbs   Left Hand Grip (lbs) 40#   Left Hand Lateral Pinch 11 lbs   Left 3 point pinch 12 lbs       OT TREATMENT    Neuro muscular re-education: Pt. worked on grasping, reaching, flipping, and stacking 1" pegs with the right hand. Pt. alternated weightbearing with [place]. Pt. worked on lateral, and 3pt. Pinching with resistive clips. Emphasis was placed on placement/alignment of the thumb and 2nd distal digit pads. Pt. worked on grasping  1" cubes with resistance, and placing them with isolating 2nd digit.                            OT Long Term Goals - 08/17/15 1431    OT LONG TERM GOAL #1   Title Pt. will improve right shoulder flexion strength by 2 mm grades to assist with reaching into cabinetry.   Baseline Pt. is unable   Time 12   Period Weeks   Status Achieved   OT LONG TERM GOAL #2   Baseline limited digit fisting flexion to [facility].   Time 12   Period Weeks   Status On-going   OT LONG TERM GOAL #3   Title Pt. will improve right elbow flexion and extension strength by 2 mm grades to be able to apply deoderant and wash left axilla region.   Baseline Pt. requires assist to apply deoderant and wash left axilla.   Time 12   Period Weeks   Status On-going   OT LONG TERM GOAL #4   Title Pt. will improve right shoulder abduction by 10 degrees to  assist with UE dressing.   Baseline Pt. requires assist   Period Weeks   Status Achieved   OT LONG TERM GOAL #5   Title Pt. will be independent with light meal prep.   Baseline Pt. requires assist   Time 12   Status On-going   Long Term Additional Goals   Additional Long Term Goals Yes   OT LONG TERM GOAL #6   Title Pt. will improve right lateral and 3 pt. pinch strength by 3# to be able to grasp and hold utensils during self-feeding.   Baseline Pt. is unable to use her right hand to hold the utensils. #pt. pinch strength: 5#, Lateral pinch strength 4#   Time 12   Period Weeks   Status New   OT LONG TERM GOAL #7   Title pt. will improve Right grip strength by 5# to be able to open jars, and container lids.   Baseline Pt. is unable to open jars and lids with right hand. Right grip strength: 8#   Time 12   Period Weeks   Status New   OT LONG TERM GOAL #8   Title Pt. will  a 3 sentence paragragh efficiently with 75% legiblity.    Baseline Pt. is a ble to print name legibly with increased time.   Period Weeks   Status New                Plan - 08/17/15 1459    Clinical Impression Statement Pt. has made progress with right hand fisting. Pt. now only lacks flexion in 2nd digit to the distal palmar crease. Pt. has progressed with grip and pinch strengthening, however continues to present with limited coordination, grip, lateral, and 3pt. pinch strength which hinder her from being able to grasp and use utensils,  perform writing, and open container lids, or jars.   Rehab Potential Good   OT Frequency 2x / week   OT Duration 12 weeks   OT Treatment/Interventions Self-care/ADL training;Electrical Stimulation;Moist Heat;Manual Therapy;DME and/or AE instruction;Therapeutic activities;Passive range of motion;Ultrasound;Therapeutic exercises;Patient/family education;Neuromuscular education;Therapeutic exercise;Visual/perceptual remediation/compensation;Balance training   Consulted and Agree with Plan of Care Patient      Patient will benefit from skilled therapeutic intervention in order to improve the following deficits and impairments:  Decreased balance, Decreased strength, Decreased coordination, Decreased activity tolerance, Decreased range of motion, Impaired UE functional use, Decreased knowledge of use of DME, Decreased endurance, Impaired tone  Visit Diagnosis: Other lack of coordination  Muscle weakness (generalized)    Problem List Patient Active Problem List   Diagnosis Date Noted  . Acute lower UTI   . Ischemic stroke of frontal lobe ([place]) 06/16/2015  . Dysphagia as late effect of cerebrovascular disease   . Hemiparesis affecting dominant side as late effect of stroke ([place])   . Esophageal stricture   . Bacteremia   . Atrial flutter ([place])   . Rheumatoid arthritis ([place])   . Hyperglycemia   . Thyroid activity decreased   . Infection due to Streptococcus pyogenes 06/12/2015  . Severe sepsis with septic shock ([place]) 06/12/2015  . Hematuria 06/12/2015  . Dysphagia 06/12/2015  . 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  CVA (cerebral infarction)   . Acute respiratory failure ([place])   . Encounter for diagnostic procedure   . Ventilator dependence ([place])   . Carotid thromboses   . HLD (hyperlipidemia)   . Respiratory failure ([place])   . Stroke (cerebrum) ([place])   . Research study patient   . Endotracheal  tube present   . Endotracheally intubated   . Encounter for feeding tube placement   . Altered mental state   . Weakness   . Sepsis ([place]) 05/30/2015  . CAP (community acquired pneumonia)   . Encephalopathy   . Encounter for central line placement   . Hypoxia   . Lactic acidosis    Harrel Carina, MS, OTR/L  Harrel Carina 08/17/2015, 5:28 PM  [place] MAIN [facility] SERVICES [address] [place], [place], [address] Phone: [phone]   Fax:  [phone]  Name: [name] [name] MRN: [id number] Date of Birth: [birth year]

## 2015-08-19 ENCOUNTER — Ambulatory Visit: Payer: BC Managed Care – PPO | Admitting: Physical Therapy

## 2015-08-19 ENCOUNTER — Ambulatory Visit: Payer: BC Managed Care – PPO | Admitting: Occupational Therapy

## 2015-08-19 DIAGNOSIS — R278 Other lack of coordination: Secondary | ICD-10-CM | POA: Diagnosis not present

## 2015-08-19 DIAGNOSIS — R29810 Facial weakness: Secondary | ICD-10-CM

## 2015-08-19 NOTE — Therapy (Signed)
[name] [name] [address] [place], [place], [address] Phone: [phone]   Fax:  [phone]  Occupational Therapy Treatment  Patient Details  Name: [name] [name] MRN: [id number] Date of Birth: 08-Dec-1955 Referring Provider: Read [name]  Encounter Date: 08/19/2015      OT End of Session - 08/19/15 1632    Visit Number 12   Number of Visits 30   Date for OT Re-Evaluation 10/03/15   Authorization Type 30   OT Start Time 1545   OT Stop Time 1630   OT Time Calculation (min) 45 min   Activity Tolerance Patient tolerated treatment well   Behavior During Therapy [facility] for tasks assessed/performed      Past Medical History  Diagnosis Date  . Hypothyroidism   . Seizures ([place])   . Adenopathy   . Scoliosis   . Spleen anomaly   . Anemia   . Sjogren's disease ([place])   . Raynaud disease   . Shingles   . Osteopenia     Past Surgical History  Procedure Laterality Date  . Tonsillectomy    . Breast cyst aspiration Right 1990's    neg    There were no vitals filed for this visit.      Subjective Assessment - 08/19/15 1630    Subjective  Pt. reports she is wearing a brace for carpal tunnel.   Patient is accompained by: Family member   Pertinent History [name] [name] is a 60 y.o.right handed female with history of Sjogrens disease/rheumatoid arthritis, noted history of seizure but not on antiepileptic meds and dysphagia. Patient diagnosed 4 days prior with an placed on [name] [name]. Lives with spouse independent prior to admission. 2 level home with bedroom on main level. 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 Presented 05/29/2015 with right side weakness and gait instability. Patient with severe restrictive airway distress when he presented to the ED with oxygen saturations in the 70s requiring nonrebreathing mask and later intubated with slow extubation follow-up per critical care pulmonary [name]. Chest x-ray  showed large right lung consolidative changes. CT angiogram chest no evidence of pulmonary emboli. Cranial CT scan negative. 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 Echocardiogram with ejection fraction of 65% no wall motion abnormalities. 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 EEG was negative for seizure. Patient did not receive TPA. Blood cultures showed group A strep pansensitive 05/30/2015 and currently maintained on [name] [name] with droplet contact precautions   Patient Stated Goals To regain use of her right hand.        OT TREATMENT    Neuro muscular re-education: Pt. Worked on grasping resistive clips with the 2nd digit and thumb in lateral and 3 point pinch. Emphasis was placed on the position of the distal digit pads. Pt. Worked on grasping 1" resistive cubes, and isolating the 2nd digit when placing the cubes back. Pt. Worked on grasping flat 1/2" coins and placing them in a container, and isolating the 2nd digit. Pt. Worked on thumb movements when moving objects within the hand.  Therapeutic Exercise: Pt. performed gross gripping with grip strengthener. Pt. worked on sustaining grip while grasping pegs and reaching at various low heights.                         OT Education - 08/19/15 1631    Education provided Yes   Person(s)  Educated Patient   Methods Explanation   Comprehension Verbalized understanding             OT Long Term Goals - 08/17/15 1431    OT LONG TERM GOAL #1   Title Pt. will improve right shoulder flexion strength by 2 mm grades to assist with reaching into cabinetry.   Baseline Pt. is unable   Time 12   Period Weeks   Status Achieved   OT LONG TERM GOAL #2   Baseline limited digit fisting flexion to [facility].   Time 12   Period Weeks   Status On-going   OT LONG TERM GOAL #3   Title Pt. will improve right elbow flexion and extension strength by 2 mm grades to be able to  apply deoderant and wash left axilla region.   Baseline Pt. requires assist to apply deoderant and wash left axilla.   Time 12   Period Weeks   Status On-going   OT LONG TERM GOAL #4   Title Pt. will improve right shoulder abduction by 10 degrees to assist with UE dressing.   Baseline Pt. requires assist   Period Weeks   Status Achieved   OT LONG TERM GOAL #5   Title Pt. will be independent with light meal prep.   Baseline Pt. requires assist   Time 12   Status On-going   Long Term Additional Goals   Additional Long Term Goals Yes   OT LONG TERM GOAL #6   Title Pt. will improve right lateral and 3 pt. pinch strength by 3# to be able to grasp and hold utensils during self-feeding.   Baseline Pt. is unable to use her right hand to hold the utensils. #pt. pinch strength: 5#, Lateral pinch strength 4#   Time 12   Period Weeks   Status New   OT LONG TERM GOAL #7   Title pt. will improve Right grip strength by 5# to be able to open jars, and container lids.   Baseline Pt. is unable to open jars and lids with right hand. Right grip strength: 8#   Time 12   Period Weeks   Status New   OT LONG TERM GOAL #8   Title Pt. will  a 3 sentence paragragh efficiently with 75% legiblity.    Baseline Pt. is a ble to print name legibly with increased time.   Period Weeks   Status New               Plan - 08/19/15 1633    Clinical Impression Statement Pt. is improving with right hand fisting, grip, coordination, and hand function. Pt. continues to require work on improving grip strength, pinch strength, thumb movements, translatory movements, and isolating the 2nd digit. pt. conitnues to have difficulty isolating the 2nd digit. Pt. requires cues to use the distal digit pads when grasping.   Rehab Potential Good   OT Frequency 2x / week   OT Duration 12 weeks   OT Treatment/Interventions Self-care/ADL training;Electrical Stimulation;Moist Heat;Manual Therapy;DME and/or AE  instruction;Therapeutic activities;Passive range of motion;Ultrasound;Therapeutic exercises;Patient/family education;Neuromuscular education;Therapeutic exercise;Visual/perceptual remediation/compensation;Balance training   Consulted and Agree with Plan of Care Patient      Patient will benefit from skilled therapeutic intervention in order to improve the following deficits and impairments:  Decreased balance, Decreased strength, Decreased coordination, Decreased activity tolerance, Decreased range of motion, Impaired UE functional use, Decreased knowledge of use of DME, Decreased endurance, Impaired tone  Visit Diagnosis: Other lack of coordination  Facial  weakness    Problem List Patient Active Problem List   Diagnosis Date Noted  . Acute lower UTI   . Ischemic stroke of frontal lobe ([place]) 06/16/2015  . Dysphagia as late effect of cerebrovascular disease   . Hemiparesis affecting dominant side as late effect of stroke ([place])   . Esophageal stricture   . Bacteremia   . Atrial flutter ([place])   . Rheumatoid arthritis ([place])   . Hyperglycemia   . Thyroid activity decreased   . Infection due to Streptococcus pyogenes 06/12/2015  . Severe sepsis with septic shock ([place]) 06/12/2015  . Hematuria 06/12/2015  . Dysphagia 06/12/2015  . 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 CVA (cerebral infarction)   . Acute respiratory failure ([place])   . Encounter for diagnostic procedure   . Ventilator dependence ([place])   . Carotid thromboses   . HLD (hyperlipidemia)   . Respiratory failure ([place])   . Stroke (cerebrum) ([place])   . Research study patient   . Endotracheal tube present   . Endotracheally intubated   . Encounter for feeding tube placement   . Altered mental state   . Weakness   . Sepsis ([place]) 05/30/2015  . CAP (community acquired pneumonia)   . Encephalopathy   . Encounter for central line placement   . Hypoxia   . Lactic acidosis    Harrel Carina, MS, OTR/L  Harrel Carina 08/19/2015, 4:51 PM  [place] MAIN [name] [address] [place], [place], [address] Phone: [phone]   Fax:  [phone]  Name: [name] [name] MRN: [id number] Date of Birth: Nov 10, 1955

## 2015-08-19 NOTE — Patient Instructions (Signed)
OT TREATMENT    Neuro muscular re-education: Pt. Worked on grasping resistive clips with the 2nd digit and thumb in lateral and 3 point pinch. Emphasis was placed on the position of the distal digit pads. Pt. Worked on grasping 1" resistive cubes, and isolating the 2nd digit when placing the cubes back. Pt. Worked on grasping flat 1/2" coins and placing them in a container, and isolating the 2nd digit. Pt. Worked on thumb movements when moving objects within the hand.  Therapeutic Exercise: Pt. performed gross gripping with grip strengthener. Pt. worked on sustaining grip while grasping pegs and reaching at various low heights.

## 2015-08-23 ENCOUNTER — Other Ambulatory Visit: Payer: Self-pay

## 2015-08-23 MED ORDER — ATORVASTATIN CALCIUM 20 MG PO TABS: 20.0000 mg | ORAL_TABLET | Freq: Every day | ORAL | Status: AC

## 2015-08-23 NOTE — Telephone Encounter (Signed)
Received fax, sent Rx.

## 2015-08-24 ENCOUNTER — Ambulatory Visit: Payer: BC Managed Care – PPO | Admitting: Physical Therapy

## 2015-08-24 ENCOUNTER — Ambulatory Visit: Payer: BC Managed Care – PPO | Admitting: Occupational Therapy

## 2015-08-24 DIAGNOSIS — R278 Other lack of coordination: Secondary | ICD-10-CM

## 2015-08-24 NOTE — Patient Instructions (Signed)
OT TREATMENT    Neuro muscular re-education: Pt. Powers skills grasping Small/mini resistive clips. Pt. worked on grasping 1/4" objects with emphasis placed on grasping and storing objects in the palm of her hand.  Therapeutic Exercise: Pt. performed gross gripping with grip strengthener. Pt. worked on sustaining grip while grasping pegs and reaching at various heights.  Pt. Was able to sustain grip on gripper with cues. Pt. Worked on pinch strengthening in the left hand for lateral/3pt. pinch using resistive clips.Tactlie and verbal cues were required for eliciting the desired movement.

## 2015-08-24 NOTE — Therapy (Signed)
[name] MAIN [facility] SERVICES [address] [place], [place], [address] Phone: [phone]   Fax:  [phone]  Occupational Therapy Treatment  Patient Details  Name: [name] [name] MRN: [id number] Date of Birth: 29-Mar-1956 Referring Provider: Read Drivers  Encounter Date: 08/24/2015      OT End of Session - 08/24/15 1725    Visit Number 13   Number of Visits 30   Date for OT Re-Evaluation 10/03/15   Authorization Type 30   OT Start Time 1520   OT Stop Time 1600   OT Time Calculation (min) 40 min   Activity Tolerance Patient tolerated treatment well   Behavior During Therapy [facility] for tasks assessed/performed      Past Medical History  Diagnosis Date  . Hypothyroidism   . Seizures ([place])   . Adenopathy   . Scoliosis   . Spleen anomaly   . Anemia   . Sjogren's disease ([place])   . Raynaud disease   . Shingles   . Osteopenia     Past Surgical History  Procedure Laterality Date  . Tonsillectomy    . Breast cyst aspiration Right 1990's    neg    There were no vitals filed for this visit.      Subjective Assessment - 08/24/15 1717    Subjective  Pt. has been exercising at [employer].   Patient is accompained by: Family member   Pertinent History [name] [name] is a 60 y.o.right handed female with history of Sjogrens disease/rheumatoid arthritis, noted history of seizure but not on antiepileptic meds and dysphagia. Patient diagnosed 4 days prior with an placed on Tamiflu. Lives with spouse independent prior to admission. 2 level home with bedroom on main level. 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 Presented 05/29/2015 with right side weakness and gait instability. Patient with severe restrictive airway distress when he presented to the ED with oxygen saturations in the 70s requiring nonrebreathing mask and later intubated with slow extubation follow-up per critical care pulmonary services. Chest x-ray showed large  right lung consolidative changes. CT angiogram chest no evidence of pulmonary emboli. Cranial CT scan negative. 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 Echocardiogram with ejection fraction of 65% no wall motion abnormalities. 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 EEG was negative for seizure. Patient did not receive TPA. Blood cultures showed group A strep pansensitive 05/30/2015 and currently maintained on Rocephin with droplet contact precautions   Patient Stated Goals To regain use of her right hand.   Currently in Pain? No/denies   Pain Score 0-No pain        OT TREATMENT    Neuro muscular re-education: Pt. [place] skills grasping Small/mini resistive clips. Pt. worked on grasping 1/4" objects with emphasis placed on grasping and storing objects in the palm of her hand.  Therapeutic Exercise: Pt. performed gross gripping with grip strengthener. Pt. worked on sustaining grip while grasping pegs and reaching at various heights.  Pt. Was able to sustain grip on gripper with cues. Pt. Worked on pinch strengthening in the left hand for lateral/3pt. pinch using resistive clips.Tactlie and verbal cues were required for eliciting the desired movement.                         OT Education - 08/24/15 1725    Education provided Yes   Person(s) Educated Patient  Methods Explanation   Comprehension Verbalized understanding             OT Long Term Goals - 08/17/15 1431    OT LONG TERM GOAL #1   Title Pt. will improve right shoulder flexion strength by 2 mm grades to assist with reaching into cabinetry.   Baseline Pt. is unable   Time 12   Period Weeks   Status Achieved   OT LONG TERM GOAL #2   Baseline limited digit fisting flexion to [facility].   Time 12   Period Weeks   Status On-going   OT LONG TERM GOAL #3   Title Pt. will improve right elbow flexion and extension strength by 2 mm grades to be able to  apply deoderant and wash left axilla region.   Baseline Pt. requires assist to apply deoderant and wash left axilla.   Time 12   Period Weeks   Status On-going   OT LONG TERM GOAL #4   Title Pt. will improve right shoulder abduction by 10 degrees to assist with UE dressing.   Baseline Pt. requires assist   Period Weeks   Status Achieved   OT LONG TERM GOAL #5   Title Pt. will be independent with light meal prep.   Baseline Pt. requires assist   Time 12   Status On-going   Long Term Additional Goals   Additional Long Term Goals Yes   OT LONG TERM GOAL #6   Title Pt. will improve right lateral and 3 pt. pinch strength by 3# to be able to grasp and hold utensils during self-feeding.   Baseline Pt. is unable to use her right hand to hold the utensils. #pt. pinch strength: 5#, Lateral pinch strength 4#   Time 12   Period Weeks   Status New   OT LONG TERM GOAL #7   Title pt. will improve Right grip strength by 5# to be able to open jars, and container lids.   Baseline Pt. is unable to open jars and lids with right hand. Right grip strength: 8#   Time 12   Period Weeks   Status New   OT LONG TERM GOAL #8   Title Pt. will  a 3 sentence paragragh efficiently with 75% legiblity.    Baseline Pt. is a ble to print name legibly with increased time.   Period Weeks   Status New               Plan - 08/24/15 1727    Clinical Impression Statement Pt. continues to improve with RUE and hand strength, and hand function. Pt. conitinues to work on improving [facility] with emphasis placed on grasping with the distal digit pads of the thumb and 2nd digit.   Rehab Potential Good   OT Frequency 2x / week   OT Duration 12 weeks   OT Treatment/Interventions Self-care/ADL training;Electrical Stimulation;Moist Heat;Manual Therapy;DME and/or AE instruction;Therapeutic activities;Passive range of motion;Ultrasound;Therapeutic exercises;Patient/family education;Neuromuscular education;Therapeutic  exercise;Visual/perceptual remediation/compensation;Balance training   Consulted and Agree with Plan of Care Patient      Patient will benefit from skilled therapeutic intervention in order to improve the following deficits and impairments:  Decreased balance, Decreased strength, Decreased coordination, Decreased activity tolerance, Decreased range of motion, Impaired UE functional use, Decreased knowledge of use of DME, Decreased endurance, Impaired tone  Visit Diagnosis: Other lack of coordination    Problem List Patient Active Problem List   Diagnosis Date Noted  . Acute lower UTI   . Ischemic  stroke of frontal lobe ([place]) 06/16/2015  . Dysphagia as late effect of cerebrovascular disease   . Hemiparesis affecting dominant side as late effect of stroke ([place])   . Esophageal stricture   . Bacteremia   . Atrial flutter ([place])   . Rheumatoid arthritis ([place])   . Hyperglycemia   . Thyroid activity decreased   . Infection due to Streptococcus pyogenes 06/12/2015  . Severe sepsis with septic shock ([place]) 06/12/2015  . Hematuria 06/12/2015  . Dysphagia 06/12/2015  . 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 CVA (cerebral infarction)   . Acute respiratory failure ([place])   . Encounter for diagnostic procedure   . Ventilator dependence ([place])   . Carotid thromboses   . HLD (hyperlipidemia)   . Respiratory failure ([place])   . Stroke (cerebrum) ([place])   . Research study patient   . Endotracheal tube present   . Endotracheally intubated   . Encounter for feeding tube placement   . Altered mental state   . Weakness   . Sepsis ([place]) 05/30/2015  . CAP (community acquired pneumonia)   . Encephalopathy   . Encounter for central line placement   . Hypoxia   . Lactic acidosis    Harrel Carina, MS, OTR/L   Harrel Carina 08/24/2015, 5:31 PM  [place] MAIN [facility] SERVICES [address] [place], [place], [address] Phone: [phone]   Fax:   [phone]  Name: [name] [name] MRN: [id number] Date of Birth: Sep 20, 1955

## 2015-08-25 ENCOUNTER — Encounter: Payer: BC Managed Care – PPO | Admitting: Occupational Therapy

## 2015-08-26 ENCOUNTER — Encounter: Payer: BC Managed Care – PPO | Admitting: Occupational Therapy

## 2015-08-26 ENCOUNTER — Ambulatory Visit: Payer: BC Managed Care – PPO | Admitting: Physical Therapy

## 2015-08-27 ENCOUNTER — Ambulatory Visit: Payer: BC Managed Care – PPO | Admitting: Occupational Therapy

## 2015-08-27 DIAGNOSIS — R278 Other lack of coordination: Secondary | ICD-10-CM

## 2015-08-27 DIAGNOSIS — M6281 Muscle weakness (generalized): Secondary | ICD-10-CM

## 2015-08-27 NOTE — Therapy (Signed)
[place] MAIN [facility] SERVICES [address] [place], [place], [address] Phone: [phone]   Fax:  [phone]  Occupational Therapy Treatment  Patient Details  Name: [name] [name] MRN: [id number] Date of Birth: May 16, 1955 Referring Provider: Read Drivers  Encounter Date: 08/27/2015      OT End of Session - 08/27/15 1126    Visit Number 14   Number of Visits 30   Date for OT Re-Evaluation 10/03/15   Authorization Type 30   OT Start Time 1037   OT Stop Time 1123   OT Time Calculation (min) 46 min   Activity Tolerance Patient tolerated treatment well   Behavior During Therapy [facility] for tasks assessed/performed      Past Medical History  Diagnosis Date  . Hypothyroidism   . Seizures ([place])   . Adenopathy   . Scoliosis   . Spleen anomaly   . Anemia   . Sjogren's disease ([place])   . Raynaud disease   . Shingles   . Osteopenia     Past Surgical History  Procedure Laterality Date  . Tonsillectomy    . Breast cyst aspiration Right 1990's    neg    There were no vitals filed for this visit.      Subjective Assessment - 08/27/15 1124    Subjective  Pt. had a nuclear stress test done yesterday, and anticipates the results this week.   Pertinent History [name] [name] is a 60 y.o.right handed female with history of Sjogrens disease/rheumatoid arthritis, noted history of seizure but not on antiepileptic meds and dysphagia. Patient diagnosed 4 days prior with an placed on Tamiflu. Lives with spouse independent prior to admission. 2 level home with bedroom on main level. 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 Presented 05/29/2015 with right side weakness and gait instability. Patient with severe restrictive airway distress when he presented to the ED with oxygen saturations in the 70s requiring nonrebreathing mask and later intubated with slow extubation follow-up per critical care pulmonary services. Chest x-ray showed large  right lung consolidative changes. CT angiogram chest no evidence of pulmonary emboli. Cranial CT scan negative. 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 Echocardiogram with ejection fraction of 65% no wall motion abnormalities. 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 EEG was negative for seizure. Patient did not receive TPA. Blood cultures showed group A strep pansensitive 05/30/2015 and currently maintained on Rocephin with droplet contact precautions   Patient Stated Goals To regain use of her right hand.      OT TREATMENT    Neuro muscular re-education:Pt. performed [facility] tasks using the Grooved pegboard. Pt. worked on grasping the grooved pegs from a horizontal position, and moving the pegs to a vertical position in the hand to prepare for placing them in the grooved slot. Pt. Had difficulty grasping, and manipulating, and turning the pegs. Pt. Also presents with changes in posture with leaning when trying to turn the pegs in hand to place them into the pegboard.  Therapeutic Exercise:3# dumbbell ex. for elbow flexion and extension, forearm supination/pronation, 2# for wrist flexion/extension, and radial deviation. Pt. requires verbal cues for proper technique. Pt. performed gross gripping with grip strengthener. Pt. worked on sustaining grip while grasping pegs and reaching at various heights. Pt. worked on strengthening to be able to grasp and hold objects for ADL/IADLs.  OT Education - 08/27/15 1125    Education provided Yes   Education Details Right UE ther. ex, and  [place].   Person(s) Educated Patient   Methods Explanation   Comprehension Verbalized understanding             OT Long Term Goals - 08/17/15 1431    OT LONG TERM GOAL #1   Title Pt. will improve right shoulder flexion strength by 2 mm grades to assist with reaching into cabinetry.   Baseline Pt. is unable   Time  12   Period Weeks   Status Achieved   OT LONG TERM GOAL #2   Baseline limited digit fisting flexion to [facility].   Time 12   Period Weeks   Status On-going   OT LONG TERM GOAL #3   Title Pt. will improve right elbow flexion and extension strength by 2 mm grades to be able to apply deoderant and wash left axilla region.   Baseline Pt. requires assist to apply deoderant and wash left axilla.   Time 12   Period Weeks   Status On-going   OT LONG TERM GOAL #4   Title Pt. will improve right shoulder abduction by 10 degrees to assist with UE dressing.   Baseline Pt. requires assist   Period Weeks   Status Achieved   OT LONG TERM GOAL #5   Title Pt. will be independent with light meal prep.   Baseline Pt. requires assist   Time 12   Status On-going   Long Term Additional Goals   Additional Long Term Goals Yes   OT LONG TERM GOAL #6   Title Pt. will improve right lateral and 3 pt. pinch strength by 3# to be able to grasp and hold utensils during self-feeding.   Baseline Pt. is unable to use her right hand to hold the utensils. #pt. pinch strength: 5#, Lateral pinch strength 4#   Time 12   Period Weeks   Status New   OT LONG TERM GOAL #7   Title pt. will improve Right grip strength by 5# to be able to open jars, and container lids.   Baseline Pt. is unable to open jars and lids with right hand. Right grip strength: 8#   Time 12   Period Weeks   Status New   OT LONG TERM GOAL #8   Title Pt. will  a 3 sentence paragragh efficiently with 75% legiblity.    Baseline Pt. is a ble to print name legibly with increased time.   Period Weeks   Status New               Plan - 08/27/15 1128    Clinical Impression Statement Pt. is making progress with RUE, elbow, and wrist strength. pt. continues to require work on  sustaining grip strength when reaching, and [facility] skills. Pt. does compensate proximally at times with coordination tasks. Pt. requires cues to grasp with the tip of the 2nd-5th  distal digit pads for grasping ADL and IADL items..   Rehab Potential Good   OT Frequency 2x / week   OT Duration 12 weeks   OT Treatment/Interventions Self-care/ADL training;Electrical Stimulation;Moist Heat;Manual Therapy;DME and/or AE instruction;Therapeutic activities;Passive range of motion;Ultrasound;Therapeutic exercises;Patient/family education;Neuromuscular education;Therapeutic exercise;Visual/perceptual remediation/compensation;Balance training   Consulted and Agree with Plan of Care Patient      Patient will benefit from skilled therapeutic intervention in order to improve the following deficits and impairments:  Decreased balance, Decreased strength, Decreased coordination, Decreased activity tolerance,  Decreased range of motion, Impaired UE functional use, Decreased knowledge of use of DME, Decreased endurance, Impaired tone  Visit Diagnosis: Other lack of coordination  Muscle weakness (generalized)    Problem List Patient Active Problem List   Diagnosis Date Noted  . Acute lower UTI   . Ischemic stroke of frontal lobe ([place]) 06/16/2015  . Dysphagia as late effect of cerebrovascular disease   . Hemiparesis affecting dominant side as late effect of stroke ([place])   . Esophageal stricture   . Bacteremia   . Atrial flutter ([place])   . Rheumatoid arthritis ([place])   . Hyperglycemia   . Thyroid activity decreased   . Infection due to Streptococcus pyogenes 06/12/2015  . Severe sepsis with septic shock ([place]) 06/12/2015  . Hematuria 06/12/2015  . Dysphagia 06/12/2015  . 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 CVA (cerebral infarction)   . Acute respiratory failure ([place])   . Encounter for diagnostic procedure   . Ventilator dependence ([place])   . Carotid thromboses   . HLD (hyperlipidemia)   . Respiratory failure ([place])   . Stroke (cerebrum) ([place])   . Research study patient   . Endotracheal tube present   . Endotracheally intubated   . Encounter for feeding tube placement   .  Altered mental state   . Weakness   . Sepsis ([place]) 05/30/2015  . CAP (community acquired pneumonia)   . Encephalopathy   . Encounter for central line placement   . Hypoxia   . Lactic acidosis    Harrel Carina, MS, OTR/L  Harrel Carina 08/27/2015, 11:48 AM  [place] MAIN [facility] SERVICES [address] [place], [place], [address] Phone: [phone]   Fax:  [phone]  Name: [name] [name] MRN: [id number] Date of Birth: [birth year]

## 2015-08-27 NOTE — Patient Instructions (Signed)
OT TREATMENT    Neuro muscular re-education:Pt. performed [facility] tasks using the Grooved pegboard. Pt. worked on grasping the grooved pegs from a horizontal position, and moving the pegs to a vertical position in the hand to prepare for placing them in the grooved slot. Pt. Had difficulty grasping, and manipulating, and turning the pegs. Pt. Also presents with changes in posture with leaning when trying to turn the pegs in hand.  Therapeutic Exercise:3# dumbbell ex. for elbow flexion and extension, forearm supination/pronation, 2# for wrist flexion/extension, and radial deviation. Pt. requires verbal cues for proper technique. Pt. performed gross gripping with grip strengthener. Pt. worked on sustaining grip while grasping pegs and reaching at various heights. Pt. worked on strengthening to be able to grasp and hold objects for ADL/IADLs.

## 2015-08-31 ENCOUNTER — Ambulatory Visit: Payer: BC Managed Care – PPO | Admitting: Occupational Therapy

## 2015-08-31 ENCOUNTER — Ambulatory Visit: Payer: BC Managed Care – PPO | Admitting: Physical Therapy

## 2015-08-31 DIAGNOSIS — M6281 Muscle weakness (generalized): Secondary | ICD-10-CM

## 2015-08-31 DIAGNOSIS — R278 Other lack of coordination: Secondary | ICD-10-CM

## 2015-08-31 NOTE — Patient Instructions (Signed)
OT TREATMENT    Neuro muscular re-education:  Pt. Worked on grasping mini clips with her thumb, and 2nd digit. Emphasis was placed on aligning the thumb and 2nd digit distal digit pads. Pt. worked on tasks to sustain lateral pinch on resistive tweezers while grasping and moving 2" sticks. Pt. Initially attempted toothpick sticks, however, the tasks was graded to thicker sticks for improved sustained grasp. Sticks were grasped, and moved from a horizontal flat position to a vertical position in order to place it in the holder. Pt. Had difficulty sustaining grasp while positioning and extending the wrist/hand in the necessary alignment needed to place the stick through the top of the holder.  Therapeutic Exercise:  Pt. performed gross gripping with grip strengthener. Pt. worked on sustaining grip while grasping pegs and reaching at various heights. Grip strengthener was placed in the 2nd slot, with white resistive spring. Pt. Worked on pinch strengthening in the left hand for lateral pinch, and 3pt pinch using yellow, and green resistive clips.Tactlie and verbal cues were required for eliciting the desired movement. Emphasis was placed on the alignment of the thumb and digits. Attempted in resistance to green clip, however pt. Was unable to apply pinch pressure yet.

## 2015-08-31 NOTE — Therapy (Signed)
[place] MAIN [facility] SERVICES [address] [place], [place], [address] Phone: [phone]   Fax:  [phone]  Occupational Therapy Treatment  Patient Details  Name: [name] [name] MRN: [id number] Date of Birth: 1956-02-03 Referring Provider: Read Drivers  Encounter Date: 08/31/2015      OT End of Session - 08/31/15 1720    Visit Number 15   Number of Visits 30   Date for OT Re-Evaluation 10/03/15   Authorization Type 30   OT Start Time 1522   OT Stop Time 1603   OT Time Calculation (min) 41 min      Past Medical History  Diagnosis Date  . Hypothyroidism   . Seizures ([place])   . Adenopathy   . Scoliosis   . Spleen anomaly   . Anemia   . Sjogren's disease ([place])   . Raynaud disease   . Shingles   . Osteopenia     Past Surgical History  Procedure Laterality Date  . Tonsillectomy    . Breast cyst aspiration Right 1990's    neg    There were no vitals filed for this visit.      Subjective Assessment - 08/31/15 1718    Subjective  Pt. reports hopes to get the results of the stress test this week. Pt. plans to go out of town for a wedding this weekend.   Patient is accompained by: Family member   Patient Stated Goals To regain use of her right hand.   Currently in Pain? No/denies      OT TREATMENT    Neuro muscular re-education:  Pt. Worked on grasping mini clips with her thumb, and 2nd digit. Emphasis was placed on aligning the thumb and 2nd digit distal digit pads. Pt. worked on tasks to sustain lateral pinch on resistive tweezers while grasping and moving 2" sticks. Pt. Initially attempted toothpick sticks, however, the tasks was graded to thicker sticks for improved sustained grasp. Sticks were grasped, and moved from a horizontal flat position to a vertical position in order to place it in the holder. Pt. Had difficulty sustaining grasp while positioning and extending the wrist/hand in the necessary alignment needed to place  the stick through the top of the holder.  Therapeutic Exercise:  Pt. performed gross gripping with grip strengthener. Pt. worked on sustaining grip while grasping pegs and reaching at various heights. Grip strengthener was placed in the 2nd slot, with white resistive spring. Pt. Worked on pinch strengthening in the left hand for lateral pinch, and 3pt pinch using yellow, and green resistive clips.Tactlie and verbal cues were required for eliciting the desired movement. Emphasis was placed on the alignment of the thumb and digits. Attempted in resistance to green clip, however pt. Was unable to apply pinch pressure yet.                           OT Education - 08/31/15 1719    Education provided Yes   Education Details Right hand coordination   Person(s) Educated Patient   Methods Explanation   Comprehension Verbalized understanding             OT Long Term Goals - 08/17/15 1431    OT LONG TERM GOAL #1   Title Pt. will improve right shoulder flexion strength by 2 mm grades to assist with reaching into cabinetry.   Baseline Pt. is unable   Time 12   Period Weeks   Status Achieved  OT LONG TERM GOAL #2   Baseline limited digit fisting flexion to [facility].   Time 12   Period Weeks   Status On-going   OT LONG TERM GOAL #3   Title Pt. will improve right elbow flexion and extension strength by 2 mm grades to be able to apply deoderant and wash left axilla region.   Baseline Pt. requires assist to apply deoderant and wash left axilla.   Time 12   Period Weeks   Status On-going   OT LONG TERM GOAL #4   Title Pt. will improve right shoulder abduction by 10 degrees to assist with UE dressing.   Baseline Pt. requires assist   Period Weeks   Status Achieved   OT LONG TERM GOAL #5   Title Pt. will be independent with light meal prep.   Baseline Pt. requires assist   Time 12   Status On-going   Long Term Additional Goals   Additional Long Term Goals Yes   OT LONG  TERM GOAL #6   Title Pt. will improve right lateral and 3 pt. pinch strength by 3# to be able to grasp and hold utensils during self-feeding.   Baseline Pt. is unable to use her right hand to hold the utensils. #pt. pinch strength: 5#, Lateral pinch strength 4#   Time 12   Period Weeks   Status New   OT LONG TERM GOAL #7   Title pt. will improve Right grip strength by 5# to be able to open jars, and container lids.   Baseline Pt. is unable to open jars and lids with right hand. Right grip strength: 8#   Time 12   Period Weeks   Status New   OT LONG TERM GOAL #8   Title Pt. will  a 3 sentence paragragh efficiently with 75% legiblity.    Baseline Pt. is a ble to print name legibly with increased time.   Period Weeks   Status New               Plan - 08/31/15 1720    Clinical Impression Statement Pt. continues to focus on improving right hand coordination, with emphasis on grasping with the distal digit pads of the thumb and 2nd digit. Pt. continues to present with wekness in the RUE grip and pinch strength when grasping and handling objects for ADLs, and IADLs.   Rehab Potential Good   OT Frequency 2x / week   OT Duration 12 weeks   OT Treatment/Interventions Self-care/ADL training;Electrical Stimulation;Moist Heat;Manual Therapy;DME and/or AE instruction;Therapeutic activities;Passive range of motion;Ultrasound;Therapeutic exercises;Patient/family education;Neuromuscular education;Therapeutic exercise;Visual/perceptual remediation/compensation;Balance training   Consulted and Agree with Plan of Care Patient      Patient will benefit from skilled therapeutic intervention in order to improve the following deficits and impairments:  Decreased balance, Decreased strength, Decreased coordination, Decreased activity tolerance, Decreased range of motion, Impaired UE functional use, Decreased knowledge of use of DME, Decreased endurance, Impaired tone  Visit Diagnosis: Muscle weakness  (generalized)  Other lack of coordination    Problem List Patient Active Problem List   Diagnosis Date Noted  . Acute lower UTI   . Ischemic stroke of frontal lobe ([place]) 06/16/2015  . Dysphagia as late effect of cerebrovascular disease   . Hemiparesis affecting dominant side as late effect of stroke ([place])   . Esophageal stricture   . Bacteremia   . Atrial flutter ([place])   . Rheumatoid arthritis ([place])   . Hyperglycemia   . Thyroid activity decreased   .  Infection due to Streptococcus pyogenes 06/12/2015  . Severe sepsis with septic shock ([place]) 06/12/2015  . Hematuria 06/12/2015  . Dysphagia 06/12/2015  . 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 CVA (cerebral infarction)   . Acute respiratory failure ([place])   . Encounter for diagnostic procedure   . Ventilator dependence ([place])   . Carotid thromboses   . HLD (hyperlipidemia)   . Respiratory failure ([place])   . Stroke (cerebrum) ([place])   . Research study patient   . Endotracheal tube present   . Endotracheally intubated   . Encounter for feeding tube placement   . Altered mental state   . Weakness   . Sepsis ([place]) 05/30/2015  . CAP (community acquired pneumonia)   . Encephalopathy   . Encounter for central line placement   . Hypoxia   . Lactic acidosis    Harrel Carina, MS, OTR/L  Harrel Carina 08/31/2015, 5:37 PM  [place] MAIN [facility] SERVICES [address] [place], [place], [address] Phone: [phone]   Fax:  [phone]  Name: [name] [name] MRN: [id number] Date of Birth: November 08, 1955

## 2015-09-01 ENCOUNTER — Ambulatory Visit: Payer: BC Managed Care – PPO | Admitting: Occupational Therapy

## 2015-09-01 DIAGNOSIS — R278 Other lack of coordination: Secondary | ICD-10-CM | POA: Diagnosis not present

## 2015-09-01 DIAGNOSIS — M6281 Muscle weakness (generalized): Secondary | ICD-10-CM

## 2015-09-01 NOTE — Patient Instructions (Addendum)
OT TREATMENT    Neuro muscular re-education:   Pt. worked on improving [facility] skills alternating thumb on finger, finger on thumb hand movements with increasing speed. Pt. Worked on grasping and turning varying sizes of nuts, and bolts. Emphasis was placed on thumb and 2nd digit distal digit pad placement with each turn.  Therapeutic Exercise:   Pt. performed right gross gripping with grip strengthener. Pt. worked on sustaining grip while grasping pegs and reaching at various heights. Grip strengthner was set at the 2nd slot, with white level resistive spring. Pt. Worked on pinch strengthening in the right hand for 2 and 3pt pinch using yellow and red resistive clips. Tactlie and verbal cues were required for eliciting the desired movement. Emphasis was placed on thumb and 2nd digit alignment.  Selfcare:   pt. Worked on grasping and opening various sized bottles/jars, and childproof medicine bottles with the right hand. Pt. Worked on grasping IADL kitchen items. Pt. Was able to hold, and pour and 1/2 full container, hold and transport a full mug and plate. Setting down a full mug without compensating proximally with her trunk. Pt. Was able to open the refrigerator handles, oven handles, and place light pans in the oven. Pt. Worked on holding a spoon and simulated scooping from a plate.

## 2015-09-01 NOTE — Therapy (Signed)
[place] MAIN [facility] SERVICES [address] [place], [place], [address] Phone: [phone]   Fax:  [phone]  Occupational Therapy Treatment  Patient Details  Name: [name] [name] MRN: [id number] Date of Birth: January 31, 1956 Referring Provider: Read Drivers  Encounter Date: 09/01/2015      OT End of Session - 09/01/15 1710    Visit Number 16   Number of Visits 30   Date for OT Re-Evaluation 10/03/15   Authorization Type 30   OT Start Time 1515   OT Stop Time 1600   OT Time Calculation (min) 45 min   Activity Tolerance Patient tolerated treatment well   Behavior During Therapy [facility] for tasks assessed/performed      Past Medical History  Diagnosis Date  . Hypothyroidism   . Seizures ([place])   . Adenopathy   . Scoliosis   . Spleen anomaly   . Anemia   . Sjogren's disease ([place])   . Raynaud disease   . Shingles   . Osteopenia     Past Surgical History  Procedure Laterality Date  . Tonsillectomy    . Breast cyst aspiration Right 1990's    neg    There were no vitals filed for this visit.      Subjective Assessment - 09/01/15 1706    Subjective  The reports from the stress test were encouraging. Pt. will receive more information when she follows up with the cardiologist.   Patient is accompained by: Family member   Pertinent History [name] [name] is a 60 y.o.right handed female with history of Sjogrens disease/rheumatoid arthritis, noted history of seizure but not on antiepileptic meds and dysphagia. Patient diagnosed 4 days prior with an placed on Tamiflu. Lives with spouse independent prior to admission. 2 level home with bedroom on main level. 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 Presented 05/29/2015 with right side weakness and gait instability. Patient with severe restrictive airway distress when he presented to the ED with oxygen saturations in the 70s requiring nonrebreathing mask and later intubated with slow  extubation follow-up per critical care pulmonary services. Chest x-ray showed large right lung consolidative changes. CT angiogram chest no evidence of pulmonary emboli. Cranial CT scan negative. 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 Echocardiogram with ejection fraction of 65% no wall motion abnormalities. 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 EEG was negative for seizure. Patient did not receive TPA. Blood cultures showed group A strep pansensitive 05/30/2015 and currently maintained on Rocephin with droplet contact precautions   Patient Stated Goals To regain use of her right hand.   Currently in Pain? No/denies      OT TREATMENT    Neuro muscular re-education:   Pt. worked on improving [facility] skills alternating thumb on finger, finger on thumb hand movements with increasing speed. Pt. Worked on grasping and turning varying sizes of nuts, and bolts. Emphasis was placed on thumb and 2nd digit distal digit pad placement with each turn.  Therapeutic Exercise:   Pt. performed right gross gripping with grip strengthener. Pt. worked on sustaining grip while grasping pegs and reaching at various heights. Grip strengthner was set at the 2nd slot, with white level resistive spring. Pt. Worked on pinch strengthening in the right hand for 2 and 3pt pinch using yellow and red resistive clips. Tactlie and verbal cues were required for eliciting the desired movement. Emphasis was placed on thumb and  2nd digit alignment.  Selfcare:   Pt. Worked on grasping and opening various sized bottles/jars, and childproof medicine bottles with the right hand. Pt. Worked on grasping IADL kitchen items. Pt. Was able to hold, and pour and 1/2 full container, hold and transport a full mug and plate. Setting down a full mug without compensating proximally with her trunk. Pt. Was able to open the refrigerator handles, oven handles, and place light pans in the  oven. Pt. Worked on holding a spoon and simulated scooping from a plate. Pt. Worked on isolating 2nd digit, while flexing her 3rd-5th digits to operate the microwave.                          OT Education - 09/01/15 1709    Education provided Yes   Education Details Right hand coordination, right hand movements during IADL/kitchen tasks.   Person(s) Educated Patient   Methods Explanation;Demonstration   Comprehension Verbalized understanding             OT Long Term Goals - 08/17/15 1431    OT LONG TERM GOAL #1   Title Pt. will improve right shoulder flexion strength by 2 mm grades to assist with reaching into cabinetry.   Baseline Pt. is unable   Time 12   Period Weeks   Status Achieved   OT LONG TERM GOAL #2   Baseline limited digit fisting flexion to [facility].   Time 12   Period Weeks   Status On-going   OT LONG TERM GOAL #3   Title Pt. will improve right elbow flexion and extension strength by 2 mm grades to be able to apply deoderant and wash left axilla region.   Baseline Pt. requires assist to apply deoderant and wash left axilla.   Time 12   Period Weeks   Status On-going   OT LONG TERM GOAL #4   Title Pt. will improve right shoulder abduction by 10 degrees to assist with UE dressing.   Baseline Pt. requires assist   Period Weeks   Status Achieved   OT LONG TERM GOAL #5   Title Pt. will be independent with light meal prep.   Baseline Pt. requires assist   Time 12   Status On-going   Long Term Additional Goals   Additional Long Term Goals Yes   OT LONG TERM GOAL #6   Title Pt. will improve right lateral and 3 pt. pinch strength by 3# to be able to grasp and hold utensils during self-feeding.   Baseline Pt. is unable to use her right hand to hold the utensils. #pt. pinch strength: 5#, Lateral pinch strength 4#   Time 12   Period Weeks   Status New   OT LONG TERM GOAL #7   Title pt. will improve Right grip strength by 5# to be able to open  jars, and container lids.   Baseline Pt. is unable to open jars and lids with right hand. Right grip strength: 8#   Time 12   Period Weeks   Status New   OT LONG TERM GOAL #8   Title Pt. will  a 3 sentence paragragh efficiently with 75% legiblity.    Baseline Pt. is a ble to print name legibly with increased time.   Period Weeks   Status New               Plan - 09/01/15 1712    Clinical Impression Statement Pt. is making  progress overall. Pt. is now using her right hand to grip and use household items including: taking light ccokware into, and out of the oven, holding and transporting a plate, and a full mug. Pt. is able to hold and pour a beverage from a half full pitcher. Pt. continues to require work on improving ADL tasks that require right hand lateral, 3pt., and 2pt pinch strength. Pt. also continues to work on coordinating ustensil use efficiently when scooping from a plate, grasping and opening child proof meication containers, as well as performing tasks requiring 2nd digit isolation (although this is improving).   Rehab Potential Good   OT Frequency 2x / week   OT Duration 12 weeks   OT Treatment/Interventions Self-care/ADL training;Electrical Stimulation;Moist Heat;Manual Therapy;DME and/or AE instruction;Therapeutic activities;Passive range of motion;Ultrasound;Therapeutic exercises;Patient/family education;Neuromuscular education;Therapeutic exercise;Visual/perceptual remediation/compensation;Balance training   Consulted and Agree with Plan of Care Patient      Patient will benefit from skilled therapeutic intervention in order to improve the following deficits and impairments:     Visit Diagnosis: Other lack of coordination  Muscle weakness (generalized)    Problem List Patient Active Problem List   Diagnosis Date Noted  . Acute lower UTI   . Ischemic stroke of frontal lobe ([place]) 06/16/2015  . Dysphagia as late effect of cerebrovascular disease   .  Hemiparesis affecting dominant side as late effect of stroke ([place])   . Esophageal stricture   . Bacteremia   . Atrial flutter ([place])   . Rheumatoid arthritis ([place])   . Hyperglycemia   . Thyroid activity decreased   . Infection due to Streptococcus pyogenes 06/12/2015  . Severe sepsis with septic shock ([place]) 06/12/2015  . Hematuria 06/12/2015  . Dysphagia 06/12/2015  . 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 CVA (cerebral infarction)   . Acute respiratory failure ([place])   . Encounter for diagnostic procedure   . Ventilator dependence ([place])   . Carotid thromboses   . HLD (hyperlipidemia)   . Respiratory failure ([place])   . Stroke (cerebrum) ([place])   . Research study patient   . Endotracheal tube present   . Endotracheally intubated   . Encounter for feeding tube placement   . Altered mental state   . Weakness   . Sepsis ([place]) 05/30/2015  . CAP (community acquired pneumonia)   . Encephalopathy   . Encounter for central line placement   . Hypoxia   . Lactic acidosis    Harrel Carina, MS, OTR/L  Harrel Carina 09/01/2015, 5:42 PM  [place] MAIN [facility] SERVICES [address] [place], [place], [address] Phone: [phone]   Fax:  [phone]  Name: [name] [name] MRN: [id number] Date of Birth: 1956/04/09

## 2015-09-02 ENCOUNTER — Encounter: Payer: BC Managed Care – PPO | Admitting: Occupational Therapy

## 2015-09-02 ENCOUNTER — Ambulatory Visit: Payer: BC Managed Care – PPO | Admitting: Physical Therapy

## 2015-09-06 ENCOUNTER — Ambulatory Visit: Payer: BC Managed Care – PPO | Admitting: Occupational Therapy

## 2015-09-06 ENCOUNTER — Ambulatory Visit: Payer: BC Managed Care – PPO | Admitting: Physical Therapy

## 2015-09-06 DIAGNOSIS — R278 Other lack of coordination: Secondary | ICD-10-CM | POA: Diagnosis not present

## 2015-09-06 DIAGNOSIS — M6281 Muscle weakness (generalized): Secondary | ICD-10-CM

## 2015-09-06 NOTE — Therapy (Signed)
[place] MAIN [facility] SERVICES [address] [place], [place], [address] Phone: [phone]   Fax:  [phone]  Occupational Therapy Treatment  Patient Details  Name: [name] [name] MRN: [id number] Date of Birth: [birth year] Referring Provider: Read Drivers  Encounter Date: 09/06/2015      OT End of Session - 09/06/15 1737    Visit Number 17   Number of Visits 30   Date for OT Re-Evaluation 10/03/15   Authorization Type 30   OT Start Time 1430   OT Stop Time 1517   OT Time Calculation (min) 47 min   Activity Tolerance Patient tolerated treatment well   Behavior During Therapy [facility] for tasks assessed/performed      Past Medical History  Diagnosis Date  . Hypothyroidism   . Seizures ([place])   . Adenopathy   . Scoliosis   . Spleen anomaly   . Anemia   . Sjogren's disease ([place])   . Raynaud disease   . Shingles   . Osteopenia     Past Surgical History  Procedure Laterality Date  . Tonsillectomy    . Breast cyst aspiration Right 1990's    neg    There were no vitals filed for this visit.      Subjective Assessment - 09/06/15 1727    Subjective  Patient reports that she went to her sister's son's wedding in [place] this past weekend and everything went well.  She feels she can see the progress she is making with her R hand   Pertinent History [name] [name] is a 60 y.o.right handed female with history of Sjogrens disease/rheumatoid arthritis, noted history of seizure but not on antiepileptic meds and dysphagia. Patient diagnosed 4 days prior with an placed on Tamiflu. Lives with spouse independent prior to admission. 2 level home with bedroom on main level. 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 Presented 05/29/2015 with right side weakness and gait instability. Patient with severe restrictive airway distress when he presented to the ED with oxygen saturations in the 70s requiring nonrebreathing mask and later intubated with  slow extubation follow-up per critical care pulmonary services. Chest x-ray showed large right lung consolidative changes. CT angiogram chest no evidence of pulmonary emboli. Cranial CT scan negative. 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 Echocardiogram with ejection fraction of 65% no wall motion abnormalities. 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 EEG was negative for seizure. Patient did not receive TPA. Blood cultures showed group A strep pansensitive 05/30/2015 and currently maintained on Rocephin with droplet contact precautions   Patient Stated Goals To regain use of her right hand.   Currently in Pain? No/denies                      OT Treatments/Exercises (OP) - 09/06/15 0001    Fine Motor Coordination   Other Fine Motor Exercises Patient seen for various fine motor tasks using bilateral hands together and R hand only.  She is able to use B hands to assemble and folllow a  PVC pipe tubing structure involving twisting with wrists, stabilizing, turning, and manipulating pieces in her hand from palm to finger tips with minimal cues.  Patient able to complete task with no assist.  She is able to pick up small pastel clothespins and manipulate small wire with R index and thumb with cues for keeping R arm from abducting.  She  completed 9/12 clothespins before her hand fatigued and initiated streching hand in extension on table top independently.  She continues to need cues for proper position of R wrist when completing small bead stringing task onto wire and dropped 3 out of 25 pieces.  2 point pinch with tips of fingers on R index and thumb more difficult and patient tends to compensate with using lateral pinch instead.   Neurological Re-education Exercises   Other Exercises 1 Patient educated in additional exercises for helping to normalize tone.                OT Education - 09/06/15 1736    Education  provided Yes   Education Details Stretches to help normalize tone during ADLs   Person(s) Educated Patient   Methods Explanation;Demonstration;Verbal cues   Comprehension Verbalized understanding             OT Long Term Goals - 09/06/15 1741    OT LONG TERM GOAL #1   Title Pt. will improve right shoulder flexion strength by 2 mm grades to assist with reaching into cabinetry.   Baseline Pt. is unable   Time 12   Period Weeks   Status Achieved   OT LONG TERM GOAL #2   Title Pt. will improve right grip fisting with digit flexion to [facility] improved by 2 cm in preparation for grasping/holding ADL grooming and feeding items.   Baseline limited digit fisting flexion to [facility].   Time 12   Period Weeks   Status On-going   OT LONG TERM GOAL #3   Title Pt. will improve right elbow flexion and extension strength by 2 mm grades to be able to apply deoderant and wash left axilla region.   Baseline Pt. requires assist to apply deoderant and wash left axilla.   Time 12   Period Weeks   Status On-going   OT LONG TERM GOAL #4   Title Pt. will improve right shoulder abduction by 10 degrees to assist with UE dressing.   Baseline Pt. requires assist   Time 12   Period Weeks   Status Achieved   OT LONG TERM GOAL #5   Title Pt. will be independent with light meal prep.   Baseline Pt. requires assist   Time 12   Period Weeks   Status On-going   OT LONG TERM GOAL #6   Title Pt. will improve right lateral and 3 pt. pinch strength by 3# to be able to grasp and hold utensils during self-feeding.   Baseline Pt. is unable to use her right hand to hold the utensils. #pt. pinch strength: 5#, Lateral pinch strength 4#   Time 12   Period Weeks   Status On-going   OT LONG TERM GOAL #7   Title pt. will improve Right grip strength by 5# to be able to open jars, and container lids.   Baseline Pt. is unable to open jars and lids with right hand. Right grip strength: 8#   Time 12   Period Weeks   Status  On-going   OT LONG TERM GOAL #8   Title Pt. will  a 3 sentence paragragh efficiently with 75% legiblity.    Baseline Pt. is a ble to print name legibly with increased time.   Period Weeks   Status On-going               Plan - 09/06/15 1737    Clinical Impression Statement Patient continues to make good progress in use  of R dominant hand for ADLs.  She needs cue for proper position of R wrist and foreram to use 2 pt pinch instead of lateral pinch for fine motor tasks.  Cues needed to keep R arm close to body and not use abduction to compensate when hand is fatiguing.  She is working  to increase control and use of isolated muscles of each finger or R hand.    Rehab Potential Good   OT Frequency 2x / week   OT Duration 12 weeks   OT Treatment/Interventions Self-care/ADL training;Electrical Stimulation;Moist Heat;Manual Therapy;DME and/or AE instruction;Therapeutic activities;Passive range of motion;Ultrasound;Therapeutic exercises;Patient/family education;Neuromuscular education;Therapeutic exercise;Visual/perceptual remediation/compensation;Balance training   Consulted and Agree with Plan of Care Patient      Patient will benefit from skilled therapeutic intervention in order to improve the following deficits and impairments:  Decreased balance, Decreased strength, Decreased coordination, Decreased activity tolerance, Decreased range of motion, Impaired UE functional use, Decreased knowledge of use of DME, Decreased endurance, Impaired tone  Visit Diagnosis: Other lack of coordination  Muscle weakness (generalized)    Problem List Patient Active Problem List   Diagnosis Date Noted  . Acute lower UTI   . Ischemic stroke of frontal lobe ([place]) 06/16/2015  . Dysphagia as late effect of cerebrovascular disease   . Hemiparesis affecting dominant side as late effect of stroke ([place])   . Esophageal stricture   . Bacteremia   . Atrial flutter ([place])   . Rheumatoid arthritis ([place])    . Hyperglycemia   . Thyroid activity decreased   . Infection due to Streptococcus pyogenes 06/12/2015  . Severe sepsis with septic shock ([place]) 06/12/2015  . Hematuria 06/12/2015  . Dysphagia 06/12/2015  . 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 CVA (cerebral infarction)   . Acute respiratory failure ([place])   . Encounter for diagnostic procedure   . Ventilator dependence ([place])   . Carotid thromboses   . HLD (hyperlipidemia)   . Respiratory failure ([place])   . Stroke (cerebrum) ([place])   . Research study patient   . Endotracheal tube present   . Endotracheally intubated   . Encounter for feeding tube placement   . Altered mental state   . Weakness   . Sepsis ([place]) 05/30/2015  . CAP (community acquired pneumonia)   . Encephalopathy   . Encounter for central line placement   . Hypoxia   . Lactic acidosis    Chrys Racer, OTR/L ascom [phone] 09/06/2015, 5:45 PM  [place] MAIN [facility] SERVICES [address] [place], [place], [address] Phone: [phone]   Fax:  [phone]  Name: [name] [name] MRN: [id number] Date of Birth: 09/08/1955

## 2015-09-07 ENCOUNTER — Encounter: Payer: BC Managed Care – PPO | Attending: Physical Medicine & Rehabilitation

## 2015-09-07 ENCOUNTER — Ambulatory Visit (HOSPITAL_BASED_OUTPATIENT_CLINIC_OR_DEPARTMENT_OTHER): Payer: BC Managed Care – PPO | Admitting: Physical Medicine & Rehabilitation

## 2015-09-07 ENCOUNTER — Encounter: Payer: Self-pay | Admitting: Physical Medicine & Rehabilitation

## 2015-09-07 VITALS — BP 107/68 | HR 74 | Resp 14

## 2015-09-07 DIAGNOSIS — E039 Hypothyroidism, unspecified: Secondary | ICD-10-CM | POA: Insufficient documentation

## 2015-09-07 DIAGNOSIS — R569 Unspecified convulsions: Secondary | ICD-10-CM | POA: Insufficient documentation

## 2015-09-07 DIAGNOSIS — I7300 Raynaud's syndrome without gangrene: Secondary | ICD-10-CM | POA: Diagnosis not present

## 2015-09-07 DIAGNOSIS — M419 Scoliosis, unspecified: Secondary | ICD-10-CM

## 2015-09-07 DIAGNOSIS — M3500 Sicca syndrome, unspecified: Secondary | ICD-10-CM | POA: Insufficient documentation

## 2015-09-07 DIAGNOSIS — Z9889 Other specified postprocedural states: Secondary | ICD-10-CM | POA: Diagnosis not present

## 2015-09-07 DIAGNOSIS — R1310 Dysphagia, unspecified: Secondary | ICD-10-CM | POA: Insufficient documentation

## 2015-09-07 DIAGNOSIS — B998 Other infectious disease: Secondary | ICD-10-CM | POA: Insufficient documentation

## 2015-09-07 DIAGNOSIS — M8580 Other specified disorders of bone density and structure, unspecified site: Secondary | ICD-10-CM | POA: Diagnosis not present

## 2015-09-07 DIAGNOSIS — R531 Weakness: Secondary | ICD-10-CM | POA: Diagnosis present

## 2015-09-07 DIAGNOSIS — I638 Other cerebral infarction: Secondary | ICD-10-CM | POA: Insufficient documentation

## 2015-09-07 DIAGNOSIS — E785 Hyperlipidemia, unspecified: Secondary | ICD-10-CM | POA: Diagnosis not present

## 2015-09-07 DIAGNOSIS — I4892 Unspecified atrial flutter: Secondary | ICD-10-CM | POA: Diagnosis not present

## 2015-09-07 DIAGNOSIS — M069 Rheumatoid arthritis, unspecified: Secondary | ICD-10-CM | POA: Insufficient documentation

## 2015-09-07 DIAGNOSIS — M4126 Other idiopathic scoliosis, lumbar region: Secondary | ICD-10-CM | POA: Diagnosis not present

## 2015-09-07 DIAGNOSIS — K222 Esophageal obstruction: Secondary | ICD-10-CM | POA: Insufficient documentation
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
## 2015-09-07 NOTE — Patient Instructions (Addendum)
Get Xray at [facility] lumbar

## 2015-09-08 ENCOUNTER — Ambulatory Visit
Admission: RE | Admit: 2015-09-08 | Discharge: 2015-09-08 | Disposition: A | Discharge status: Home / Self Care | Payer: BC Managed Care – PPO | Source: Ambulatory Visit | Attending: Physical Medicine & Rehabilitation | Admitting: Physical Medicine & Rehabilitation

## 2015-09-08 ENCOUNTER — Ambulatory Visit: Payer: BC Managed Care – PPO | Admitting: Physical Therapy

## 2015-09-08 ENCOUNTER — Ambulatory Visit: Payer: BC Managed Care – PPO | Admitting: Occupational Therapy

## 2015-09-08 DIAGNOSIS — M4126 Other idiopathic scoliosis, lumbar region: Secondary | ICD-10-CM | POA: Insufficient documentation

## 2015-09-08 DIAGNOSIS — R278 Other lack of coordination: Secondary | ICD-10-CM | POA: Diagnosis not present

## 2015-09-08 DIAGNOSIS — M6281 Muscle weakness (generalized): Secondary | ICD-10-CM

## 2015-09-08 DIAGNOSIS — M419 Scoliosis, unspecified: Secondary | ICD-10-CM

## 2015-09-08 DIAGNOSIS — M5136 Other intervertebral disc degeneration, lumbar region: Secondary | ICD-10-CM | POA: Insufficient documentation

## 2015-09-08 NOTE — Patient Instructions (Signed)
OT TREATMENT    Therapeutic Exercise:   Pt. performed 3# dowel ex. For UE strengthening secondary to weakness. Bilateral shoulder flexion, chest press, circular patterns, and elbow flexion/extension were performed. 3# dumbbell ex. for elbow flexion and extension, forearm supination/pronation, wrist flexion/extension, and radial deviation. Pt. requires rest breaks and verbal cues for proper technique.  Neuromuscular re-ed:  Pt. Worked on grasping 1/16'" objects with her right hand, with emphasis placed on grasping the peg with the tip of the 2nd digit and thumb.

## 2015-09-08 NOTE — Therapy (Signed)
[name] [name] [address] [place], [place], [address] Phone: [phone]   Fax:  [phone]  Occupational Therapy Treatment  Patient Details  Name: [name] [name] MRN: [id number] Date of Birth: November 21, 1955 Referring Provider: Read [name]  Encounter Date: 09/08/2015      OT End of Session - 09/08/15 1507    Visit Number 18   Number of Visits 30   Date for OT Re-Evaluation 10/03/15   Authorization Type 30   OT Start Time 1435   OT Stop Time 1515   OT Time Calculation (min) 40 min   Activity Tolerance Patient tolerated treatment well   Behavior During Therapy [name] [name] for tasks assessed/performed      Past Medical History  Diagnosis Date  . Hypothyroidism   . Seizures ([place])   . Adenopathy   . Scoliosis   . Spleen anomaly   . Anemia   . Sjogren's disease ([place])   . Raynaud disease   . Shingles   . Osteopenia     Past Surgical History  Procedure Laterality Date  . Tonsillectomy    . Breast cyst aspiration Right 1990's    neg    There were no vitals filed for this visit.      Subjective Assessment - 09/08/15 1505    Subjective  Pt. reports she went to see Dr [name] [name] yesterday for a follow up appointment. Pt. reports she has to have an xray of her back this afternoon.   Patient is accompained by: Family member   Pertinent History [name] is a 60 y.o.right handed female with history of Sjogrens disease/rheumatoid arthritis, noted history of seizure but not on antiepileptic meds and dysphagia. Patient diagnosed 4 days prior with an placed on Tamiflu. Lives with spouse independent prior to admission. 2 level home with bedroom on main level. 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 Presented 05/29/2015 with right side weakness and gait instability. Patient with severe restrictive airway distress when he presented to the ED with oxygen saturations in the 70s requiring nonrebreathing mask and later  intubated with slow extubation follow-up per critical care pulmonary [name]. Chest x-ray showed large right lung consolidative changes. CT angiogram chest no evidence of pulmonary emboli. Cranial CT scan negative. 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 Echocardiogram with ejection fraction of 65% no wall motion abnormalities. 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 EEG was negative for seizure. Patient did not receive TPA. Blood cultures showed group A strep pansensitive 05/30/2015 and currently maintained on Rocephin with droplet contact precautions   Patient Stated Goals To regain use of her right hand.   Currently in Pain? No/denies   Pain Score 0-No pain         OT TREATMENT    Therapeutic Exercise:   Pt. performed 3# dowel ex. For UE strengthening secondary to weakness. Bilateral shoulder flexion, chest press, circular patterns, and elbow flexion/extension were performed. 3# dumbbell ex. for elbow flexion and extension, forearm supination/pronation, wrist flexion/extension, and radial deviation. Pt. requires rest breaks and verbal cues for proper technique.  Neuromuscular re-ed:  Pt. Worked on grasping 1/16'" objects with her right hand, with emphasis placed on grasping the peg with the tip of the 2nd digit and thumb.                           OT Education -  09/08/15 1507    Education provided Yes   Person(s) Educated Patient   Methods Explanation;Demonstration   Comprehension Verbalized understanding             OT Long Term Goals - 09/06/15 1741    OT LONG TERM GOAL #1   Title Pt. will improve right shoulder flexion strength by 2 mm grades to assist with reaching into cabinetry.   Baseline Pt. is unable   Time 12   Period Weeks   Status Achieved   OT LONG TERM GOAL #2   Title Pt. will improve right grip fisting with digit flexion to [facility] improved by 2 cm in preparation for  grasping/holding ADL grooming and feeding items.   Baseline limited digit fisting flexion to [facility].   Time 12   Period Weeks   Status On-going   OT LONG TERM GOAL #3   Title Pt. will improve right elbow flexion and extension strength by 2 mm grades to be able to apply deoderant and wash left axilla region.   Baseline Pt. requires assist to apply deoderant and wash left axilla.   Time 12   Period Weeks   Status On-going   OT LONG TERM GOAL #4   Title Pt. will improve right shoulder abduction by 10 degrees to assist with UE dressing.   Baseline Pt. requires assist   Time 12   Period Weeks   Status Achieved   OT LONG TERM GOAL #5   Title Pt. will be independent with light meal prep.   Baseline Pt. requires assist   Time 12   Period Weeks   Status On-going   OT LONG TERM GOAL #6   Title Pt. will improve right lateral and 3 pt. pinch strength by 3# to be able to grasp and hold utensils during self-feeding.   Baseline Pt. is unable to use her right hand to hold the utensils. #pt. pinch strength: 5#, Lateral pinch strength 4#   Time 12   Period Weeks   Status On-going   OT LONG TERM GOAL #7   Title pt. will improve Right grip strength by 5# to be able to open jars, and container lids.   Baseline Pt. is unable to open jars and lids with right hand. Right grip strength: 8#   Time 12   Period Weeks   Status On-going   OT LONG TERM GOAL #8   Title Pt. will  a 3 sentence paragragh efficiently with 75% legiblity.    Baseline Pt. is a ble to print name legibly with increased time.   Period Weeks   Status On-going               Plan - 09/08/15 1508    Clinical Impression Statement Pt. continues to present with weak grip strength when holding objects while reaching proximally with RUE. Pt. continues to present with weak pinch strength and impaired  [facility] skills during basic ADL tasks.   Rehab Potential Good   OT Frequency 2x / week   OT Duration 12 weeks   OT  Treatment/Interventions Self-care/ADL training;Electrical Stimulation;Moist Heat;Manual Therapy;DME and/or AE instruction;Therapeutic activities;Passive range of motion;Ultrasound;Therapeutic exercises;Patient/family education;Neuromuscular education;Therapeutic exercise;Visual/perceptual remediation/compensation;Balance training   Consulted and Agree with Plan of Care Patient      Patient will benefit from skilled therapeutic intervention in order to improve the following deficits and impairments:  Decreased balance, Decreased strength, Decreased coordination, Decreased activity tolerance, Decreased range of motion, Impaired UE functional use, Decreased knowledge of use of  DME, Decreased endurance, Impaired tone  Visit Diagnosis: Other lack of coordination  Muscle weakness (generalized)    Problem List Patient Active Problem List   Diagnosis Date Noted  . Acute lower UTI   . Ischemic stroke of frontal lobe ([place]) 06/16/2015  . Dysphagia as late effect of cerebrovascular disease   . Hemiparesis affecting dominant side as late effect of stroke ([place])   . Esophageal stricture   . Bacteremia   . Atrial flutter ([place])   . Rheumatoid arthritis ([place])   . Hyperglycemia   . Thyroid activity decreased   . Infection due to Streptococcus pyogenes 06/12/2015  . Severe sepsis with septic shock ([place]) 06/12/2015  . Hematuria 06/12/2015  . Dysphagia 06/12/2015  . 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 CVA (cerebral infarction)   . Acute respiratory failure ([place])   . Encounter for diagnostic procedure   . Ventilator dependence ([place])   . Carotid thromboses   . HLD (hyperlipidemia)   . Respiratory failure ([place])   . Stroke (cerebrum) ([place])   . Research study patient   . Endotracheal tube present   . Endotracheally intubated   . Encounter for feeding tube placement   . Altered mental state   . Weakness   . Sepsis ([place]) 05/30/2015  . CAP (community acquired pneumonia)   . Encephalopathy   .  Encounter for central line placement   . Hypoxia   . Lactic acidosis    Harrel Carina, MS, OTR/L  Harrel Carina 09/08/2015, 3:31 PM  [place] MAIN [name] [address] [place], [place], [address] Phone: [phone]   Fax:  [phone]  Name: [name] [name] MRN: [id number] Date of Birth: December 13, 1955

## 2015-09-14 ENCOUNTER — Encounter: Payer: BC Managed Care – PPO | Admitting: Occupational Therapy

## 2015-09-14 ENCOUNTER — Ambulatory Visit: Payer: BC Managed Care – PPO | Admitting: Physical Therapy

## 2015-09-15 ENCOUNTER — Ambulatory Visit: Payer: Self-pay | Admitting: Neurology

## 2015-09-15 ENCOUNTER — Ambulatory Visit: Payer: BC Managed Care – PPO | Admitting: Occupational Therapy

## 2015-09-15 DIAGNOSIS — R278 Other lack of coordination: Secondary | ICD-10-CM

## 2015-09-15 DIAGNOSIS — M6281 Muscle weakness (generalized): Secondary | ICD-10-CM

## 2015-09-15 NOTE — Patient Instructions (Signed)
OT TREATMENT    Neuro muscular re-education:  Pt. worked on [facility] grasping, flipping, and stacking pegs. Pt. Worked on increasing speed, having difficulty with fine motor control with increasing speed. Pt. Worked on using tweezers to grasp 1/4" pegs and place on a platform, with narrowing the space and increasing the complexity.  Therapeutic Exercise:  Pt. Worked on improving grip strength with gross gripper. Pt. Started out with the 3rd slot level of resistance, and was graded down to the 2nd. Pt. Worked on sustaining grip while reaching a various planes.

## 2015-09-15 NOTE — Therapy (Signed)
[place] MAIN [facility] SERVICES [address] [place], [place], [address] Phone: [phone]   Fax:  [phone]  Occupational Therapy Treatment  Patient Details  Name: [name] [name] MRN: [id number] Date of Birth: [birth year] Referring Provider: Read Drivers  Encounter Date: 09/15/2015      OT End of Session - 09/15/15 1326    Visit Number 19   Number of Visits 30   Date for OT Re-Evaluation 10/03/15   Authorization Type 30   OT Start Time 1310   OT Stop Time 1348   OT Time Calculation (min) 38 min   Activity Tolerance Patient tolerated treatment well   Behavior During Therapy [facility] for tasks assessed/performed      Past Medical History  Diagnosis Date  . Hypothyroidism   . Seizures ([place])   . Adenopathy   . Scoliosis   . Spleen anomaly   . Anemia   . Sjogren's disease ([place])   . Raynaud disease   . Shingles   . Osteopenia     Past Surgical History  Procedure Laterality Date  . Tonsillectomy    . Breast cyst aspiration Right 1990's    neg    There were no vitals filed for this visit.      Subjective Assessment - 09/15/15 1354    Subjective  Pt. reports having a neurologist appointment after this appointment.   Patient is accompained by: Family member   Pertinent History [name] [name] is a 60 y.o.right handed female with history of Sjogrens disease/rheumatoid arthritis, noted history of seizure but not on antiepileptic meds and dysphagia. Patient diagnosed 4 days prior with an placed on Tamiflu. Lives with spouse independent prior to admission. 2 level home with bedroom on main level. 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 Presented 05/29/2015 with right side weakness and gait instability. Patient with severe restrictive airway distress when he presented to the ED with oxygen saturations in the 70s requiring nonrebreathing mask and later intubated with slow extubation follow-up per critical care pulmonary services.  Chest x-ray showed large right lung consolidative changes. CT angiogram chest no evidence of pulmonary emboli. Cranial CT scan negative. 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 Echocardiogram with ejection fraction of 65% no wall motion abnormalities. 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 EEG was negative for seizure. Patient did not receive TPA. Blood cultures showed group A strep pansensitive 05/30/2015 and currently maintained on Rocephin with droplet contact precautions   Patient Stated Goals To regain use of her right hand.   Currently in Pain? No/denies   Pain Score 0-No pain           OT TREATMENT    Neuro muscular re-education:  Pt. worked on [facility] grasping, flipping, and stacking pegs. Pt. Worked on increasing speed, having difficulty with fine motor control with increasing speed. Pt. Worked on using tweezers to grasp 1/4" pegs and place on a platform, with narrowing the space and increasing the complexity.  Therapeutic Exercise:  Pt. Worked on improving grip strength with gross gripper. Pt. Started out with the 3rd slot level of resistance, and was graded down to the 2nd. Pt. Worked on sustaining grip while reaching a various planes.                       OT Education - 09/15/15 1325    Education provided Yes   [employer]) Educated  Patient   Methods Explanation;Demonstration   Comprehension Verbalized understanding             OT Long Term Goals - 09/06/15 1741    OT LONG TERM GOAL #1   Title Pt. will improve right shoulder flexion strength by 2 mm grades to assist with reaching into cabinetry.   Baseline Pt. is unable   Time 12   Period Weeks   Status Achieved   OT LONG TERM GOAL #2   Title Pt. will improve right grip fisting with digit flexion to [facility] improved by 2 cm in preparation for grasping/holding ADL grooming and feeding items.   Baseline limited digit fisting flexion to  [facility].   Time 12   Period Weeks   Status On-going   OT LONG TERM GOAL #3   Title Pt. will improve right elbow flexion and extension strength by 2 mm grades to be able to apply deoderant and wash left axilla region.   Baseline Pt. requires assist to apply deoderant and wash left axilla.   Time 12   Period Weeks   Status On-going   OT LONG TERM GOAL #4   Title Pt. will improve right shoulder abduction by 10 degrees to assist with UE dressing.   Baseline Pt. requires assist   Time 12   Period Weeks   Status Achieved   OT LONG TERM GOAL #5   Title Pt. will be independent with light meal prep.   Baseline Pt. requires assist   Time 12   Period Weeks   Status On-going   OT LONG TERM GOAL #6   Title Pt. will improve right lateral and 3 pt. pinch strength by 3# to be able to grasp and hold utensils during self-feeding.   Baseline Pt. is unable to use her right hand to hold the utensils. #pt. pinch strength: 5#, Lateral pinch strength 4#   Time 12   Period Weeks   Status On-going   OT LONG TERM GOAL #7   Title pt. will improve Right grip strength by 5# to be able to open jars, and container lids.   Baseline Pt. is unable to open jars and lids with right hand. Right grip strength: 8#   Time 12   Period Weeks   Status On-going   OT LONG TERM GOAL #8   Title Pt. will  a 3 sentence paragragh efficiently with 75% legiblity.    Baseline Pt. is a ble to print name legibly with increased time.   Period Weeks   Status On-going               Plan - 09/15/15 1355    Clinical Impression Statement Pt. is improving with right hand strength, coordination, and manipulation of objects. Pt. continues to require work on improving fine motor control when increasing speed during tasks. Pt. continues to present with weak grip strength on the right when gripping, and handling     OT Frequency 2x / week   OT Duration 12 weeks   OT Treatment/Interventions Self-care/ADL training;Electrical  Stimulation;Moist Heat;Manual Therapy;DME and/or AE instruction;Therapeutic activities;Passive range of motion;Ultrasound;Therapeutic exercises;Patient/family education;Neuromuscular education;Therapeutic exercise;Visual/perceptual remediation/compensation;Balance training      Patient will benefit from skilled therapeutic intervention in order to improve the following deficits and impairments:  Decreased balance, Decreased strength, Decreased coordination, Decreased activity tolerance, Decreased range of motion, Impaired UE functional use, Decreased knowledge of use of DME, Decreased endurance, Impaired tone  Visit Diagnosis: Other lack of coordination  Muscle weakness (generalized)  Problem List Patient Active Problem List   Diagnosis Date Noted  . Acute lower UTI   . Ischemic stroke of frontal lobe ([place]) 06/16/2015  . Dysphagia as late effect of cerebrovascular disease   . Hemiparesis affecting dominant side as late effect of stroke ([place])   . Esophageal stricture   . Bacteremia   . Atrial flutter ([place])   . Rheumatoid arthritis ([place])   . Hyperglycemia   . Thyroid activity decreased   . Infection due to Streptococcus pyogenes 06/12/2015  . Severe sepsis with septic shock ([place]) 06/12/2015  . Hematuria 06/12/2015  . Dysphagia 06/12/2015  . 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 CVA (cerebral infarction)   . Acute respiratory failure ([place])   . Encounter for diagnostic procedure   . Ventilator dependence ([place])   . Carotid thromboses   . HLD (hyperlipidemia)   . Respiratory failure ([place])   . Stroke (cerebrum) ([place])   . Research study patient   . Endotracheal tube present   . Endotracheally intubated   . Encounter for feeding tube placement   . Altered mental state   . Weakness   . Sepsis ([place]) 05/30/2015  . CAP (community acquired pneumonia)   . Encephalopathy   . Encounter for central line placement   . Hypoxia   . Lactic acidosis    Harrel Carina, MS,  OTR/L  Harrel Carina 09/15/2015, 2:18 PM  [place] MAIN [facility] SERVICES [address] [place], [place], [address] Phone: [phone]   Fax:  [phone]  Name: [name] [name] MRN: [id number] Date of Birth: 1955/09/23

## 2015-09-16 ENCOUNTER — Ambulatory Visit: Payer: BC Managed Care – PPO | Admitting: Physical Therapy

## 2015-09-16 ENCOUNTER — Encounter: Payer: BC Managed Care – PPO | Admitting: Occupational Therapy

## 2015-09-17 ENCOUNTER — Ambulatory Visit (INDEPENDENT_AMBULATORY_CARE_PROVIDER_SITE_OTHER): Payer: BC Managed Care – PPO | Admitting: Neurology

## 2015-09-17 ENCOUNTER — Ambulatory Visit: Payer: BC Managed Care – PPO | Attending: Physical Medicine & Rehabilitation | Admitting: Occupational Therapy

## 2015-09-17 ENCOUNTER — Encounter: Payer: Self-pay | Admitting: Neurology

## 2015-09-17 VITALS — BP 89/61 | HR 81 | Ht 60.0 in | Wt 129.0 lb

## 2015-09-17 DIAGNOSIS — R6521 Severe sepsis with septic shock: Secondary | ICD-10-CM

## 2015-09-17 DIAGNOSIS — I4892 Unspecified atrial flutter: Secondary | ICD-10-CM

## 2015-09-17 DIAGNOSIS — I639 Cerebral infarction, unspecified: Secondary | ICD-10-CM

## 2015-09-17 DIAGNOSIS — R278 Other lack of coordination: Secondary | ICD-10-CM | POA: Diagnosis present

## 2015-09-17 DIAGNOSIS — M3500 Sicca syndrome, unspecified: Secondary | ICD-10-CM

## 2015-09-17 DIAGNOSIS — R29810 Facial weakness: Secondary | ICD-10-CM | POA: Insufficient documentation

## 2015-09-17 DIAGNOSIS — I6522 Occlusion and stenosis of left carotid artery: Secondary | ICD-10-CM | POA: Diagnosis not present

## 2015-09-17 DIAGNOSIS — A419 Sepsis, unspecified organism: Secondary | ICD-10-CM

## 2015-09-17 DIAGNOSIS — M6281 Muscle weakness (generalized): Secondary | ICD-10-CM | POA: Diagnosis present

## 2015-09-17 NOTE — Patient Instructions (Signed)
OT TREATMENT    Neuro muscular re-education:  Pt. worked on multiple tasks to improve translatory movements of the hand. Pt. Worked on manipulating objects and moving them within her hand, as well as grasping and storing 1" objects within the  palm of her hand while manipulating objects with her thumb and 2nd digit. Pt. worked on the grooved pegboard requiring cues for compensating  Proximally instead of isolating distal movements.   Therapeutic Exercise:  Pt. worked on the [employer] for UE strengthening. Constant monitoring was provided to keep the RUE on the handles. Pt. Performed for 8 min. with cues for reversing the direction. Pt. Worked on grasping. Pt. performed gross gripping with grip strengthener. Pt. worked on sustaining grip while grasping pegs and reaching at various heights. Performed in sitting with a 2# cuff weight in place.

## 2015-09-17 NOTE — Patient Instructions (Signed)
-   continue eliquis and lipitor for stroke prevention - repeat CT angio at [facility] - continue OT for right hand weakness - follow up with cardiology, rheumatology and Dr. Martina Sinner - Follow up with your primary care physician for stroke risk factor modification. Recommend maintain blood pressure goal <130/80, diabetes with hemoglobin A1c goal below 6.5% and lipids with LDL cholesterol goal below 70 mg/dL.  - check BP at home and record - keep hydrated.  - follow up in 3 months.

## 2015-09-17 NOTE — Therapy (Signed)
[place] MAIN [facility] SERVICES [address] [place], [place], [address] Phone: [phone]   Fax:  [phone]  Occupational Therapy Treatment/Progress Report  Patient Details  Name: [name] [name] MRN: [id number] Date of Birth: May 29, 1955 Referring Provider: Read Drivers  Encounter Date: 09/17/2015      OT End of Session - 09/17/15 1139    Visit Number 20   Number of Visits 30   Date for OT Re-Evaluation 10/03/15   Authorization Type 30   OT Start Time 1102   OT Stop Time 1150   OT Time Calculation (min) 48 min   Activity Tolerance Patient tolerated treatment well   Behavior During Therapy [facility] for tasks assessed/performed      Past Medical History  Diagnosis Date  . Hypothyroidism   . Seizures ([place])   . Adenopathy   . Scoliosis   . Spleen anomaly   . Anemia   . Sjogren's disease ([place])   . Raynaud disease   . Shingles   . Osteopenia   . Stroke [facility]) 20/2017    Past Surgical History  Procedure Laterality Date  . Tonsillectomy    . Breast cyst aspiration Right 1990's    neg  . Foot surgery Right   . Vocal cord surgery  1992    nodules    There were no vitals filed for this visit.      Subjective Assessment - 09/17/15 1133    Subjective  Pt. reports she is going to have   Patient Stated Goals To regain use of her right hand.            [facility] OT Assessment - 09/17/15 1155    Coordination   Right 9 Hole Peg Test 29   Hand Function   Right Hand Grip (lbs) 16#   Right Hand Lateral Pinch 5 lbs   Right Hand 3 Point Pinch 5 lbs        OT TREATMENT    Neuro muscular re-education:  Pt. worked on multiple tasks to improve translatory movements of the hand. Pt. Worked on manipulating objects and moving them within her hand, as well as grasping and storing 1" objects within the  palm of her hand while manipulating objects with her thumb and 2nd digit. Pt. worked on the grooved pegboard requiring cues secondary to  compensating proximally instead of isolating distal movements.   Therapeutic Exercise:  Pt. worked on the [employer] for UE strengthening. Constant monitoring was provided to keep the RUE on the handles. Pt. Performed for 8 min. with cues for reversing the direction. Pt. Worked on grasping. Pt. performed gross gripping with grip strengthener. Pt. worked on sustaining grip while grasping pegs and reaching at various heights. Performed in sitting with a 2# cuff weight in place.                    OT Education - 09/17/15 1138    Education provided Yes   Education Details Translatory movememts of the hand.    Person(s) Educated Patient   Methods Explanation;Demonstration   Comprehension Verbalized understanding             OT Long Term Goals - 09/17/15 1140    OT LONG TERM GOAL #1   Title Pt. will improve right shoulder flexion strength by 2 mm grades to assist with reaching into cabinetry.   Baseline Pt. is unable   Time 12   Period Weeks   Status On-going   OT LONG  TERM GOAL #2   Title Pt. will improve right grip fisting with digit flexion to [facility] improved by 2 cm in preparation for grasping/holding ADL grooming and feeding items.   Baseline limited digit fisting flexion to [facility].   Time 12   Status On-going   OT LONG TERM GOAL #3   Title Pt. will improve right elbow flexion and extension strength by 2 mm grades to be able to apply deoderant and wash left axilla region.   Baseline Pt. requires assist to apply deoderant and wash left axilla.   Time 12   Period Weeks   Status Achieved   OT LONG TERM GOAL #4   Title Pt. will improve right shoulder abduction by 10 degrees to assist with UE dressing.   Baseline Pt. requires assist   Time 12   Period Weeks   Status Achieved   OT LONG TERM GOAL #5   Title Pt. will be independent with light meal prep.   Baseline Pt. requires assist   Time 12   Period Weeks   Status On-going   Long Term Additional Goals   Additional  Long Term Goals Yes   OT LONG TERM GOAL #6   Title Pt. will improve right lateral and 3 pt. pinch strength by 3# to be able to grasp and hold utensils during self-feeding.   Baseline Pt. is unable to use her right hand to hold the utensils. #pt. pinch strength: 5#, Lateral pinch strength 4#   Time 12   Period Weeks   Status On-going   OT LONG TERM GOAL #7   Title pt. will improve Right grip strength by 5# to be able to open jars, and container lids.   Baseline Pt. is unable to open jars and lids with right hand. Right grip strength: 8#   Time 12   Period Weeks   Status On-going   OT LONG TERM GOAL #8   Title Pt. will  a 3 sentence paragragh efficiently with 75% legiblity.    Baseline Pt. is a ble to print name legibly with increased time.   Time 12   Period Weeks   Status On-going   OT LONG TERM GOAL  #9   Baseline Pt. will be independent with applying lotion over her left shoulder and scapula region with her right hand.   Time 12   Period Weeks   Status New               Plan - 09/17/15 1204    Clinical Impression Statement Pt. continues make steady progress with  RUE, grip, pinch strength, and [place]. Pt. continues to require work towards improving dominant RUE and hand function to be able to use it efficiently during ADL and IADL tasks including: writing, grooming, and cooking, tasks.   OT Frequency 2x / week   OT Duration 12 weeks   OT Treatment/Interventions Self-care/ADL training;Electrical Stimulation;Moist Heat;Manual Therapy;DME and/or AE instruction;Therapeutic activities;Passive range of motion;Ultrasound;Therapeutic exercises;Patient/family education;Neuromuscular education;Therapeutic exercise;Visual/perceptual remediation/compensation;Balance training   Consulted and Agree with Plan of Care Patient      Patient will benefit from skilled therapeutic intervention in order to improve the following deficits and impairments:  Decreased balance, Decreased strength,  Decreased coordination, Decreased activity tolerance, Decreased range of motion, Impaired UE functional use, Decreased knowledge of use of DME, Decreased endurance, Impaired tone  Visit Diagnosis: Other lack of coordination  Muscle weakness (generalized)    Problem List Patient Active Problem List   Diagnosis Date Noted  .  Acute lower UTI   . Ischemic stroke of frontal lobe ([place]) 06/16/2015  . Dysphagia as late effect of cerebrovascular disease   . Hemiparesis affecting dominant side as late effect of stroke ([place])   . Esophageal stricture   . Bacteremia   . Atrial flutter ([place])   . Rheumatoid arthritis ([place])   . Hyperglycemia   . Thyroid activity decreased   . Infection due to Streptococcus pyogenes 06/12/2015  . Severe sepsis with septic shock ([place]) 06/12/2015  . Hematuria 06/12/2015  . Dysphagia 06/12/2015  . 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 CVA (cerebral infarction)   . Acute respiratory failure ([place])   . Encounter for diagnostic procedure   . Ventilator dependence ([place])   . Carotid thromboses   . HLD (hyperlipidemia)   . Respiratory failure ([place])   . Stroke (cerebrum) ([place])   . Research study patient   . Endotracheal tube present   . Endotracheally intubated   . Encounter for feeding tube placement   . Altered mental state   . Weakness   . Sepsis ([place]) 05/30/2015  . CAP (community acquired pneumonia)   . Encephalopathy   . Encounter for central line placement   . Hypoxia   . Lactic acidosis    Harrel Carina, MS, OTR/L  Harrel Carina 09/17/2015, 12:24 PM  [place] MAIN [facility] SERVICES [address] [place], [place], [address] Phone: [phone]   Fax:  [phone]  Name: [name] [name] MRN: [id number] Date of Birth: 01-07-1956

## 2015-09-18 DIAGNOSIS — M3500 Sicca syndrome, unspecified: Secondary | ICD-10-CM | POA: Insufficient documentation

## 2015-09-18 DIAGNOSIS — I4892 Unspecified atrial flutter: Secondary | ICD-10-CM | POA: Insufficient documentation

## 2015-09-18 DIAGNOSIS — I6529 Occlusion and stenosis of unspecified carotid artery: Secondary | ICD-10-CM | POA: Insufficient documentation

## 2015-09-18 NOTE — Progress Notes (Signed)
STROKE NEUROLOGY FOLLOW UP NOTE  NAME: [name] [name] DOB: May 01, 1955  REASON FOR VISIT: stroke follow up HISTORY FROM: husband and pt and chart  Today we had the pleasure of seeing [name] [name] in follow-up at our Neurology Clinic. Pt was accompanied by husband.   History Summary [name] [name] is a 60 y.o. female with history of Sjogren's disease, seizure, hypothyroidism and dysphagia, and flu 4 days PTA who was admitted on 05/29/15 for weakness and altered mental status. Code stroke called on ER arrival due to right facial droop and flaccid paralysis R arm. In the ED she was found to have pneumonia, sepsis, septic shock and hypooxic respiratory failure and was intubated. Blood culture showed positive for strep pyogens. CSF negative for CNS infection. MRI showed left MCA embolic stroke. Concerning for endocarditis. CTA head and neck showed left CCA thrombus, but no mycotic aneurysm. CTA neck also showed extensive RUL consolidation. EKG also found to have aflutter with variable A-V block as well as SVT. Therefore, due to CCA thrombus and aflutter and without mycotic aneurysm, heparin drip was started. Found to have increased LUE muscle tone, EEG showed left frontal spike activity, but repeat EEG negative. LDL 65 and A1C 6.6. Hypercoagulable work up negative and autoimmune work up showed elevated SSA and SSB and dsDNA. 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 TTE showed EF 60-65% no vegetation. She was put on rocephin for 6 weeks for empirical endocarditis treatment. had dysphagia and PEG placed. Heparin IV switched to ASA and plavix on discharge due to hematuria which was resolved on discharge and aflutter felt like transient due to infection by Dr. [place] Man. However, pt still has left CCA thrombus which should be treated with anticoagulation as far as I am concerned. She was also started with plaquenil and prednisone for Sjogern's on discharge.   Interval History During  the interval time, the patient has been doing well. She had dramatic recovery. She only has right hand dexterity difficulty currently. Of course, when tired or stressful, she will has language hesitancy and word finding difficulty along with right leg dragging and right hand worsening weakness. Her PEG has removed. She is now on eliquis, following with cardiologist Dr. Clayborn Bigness in [place]. She also follows with her rhematologist for Sjogren's and now on plequenil. BP at low side, 89/61 today, but she stated that her BP regularly low.  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  REVIEW OF SYSTEMS: Full 14 system review of systems performed and notable only for those listed below and in HPI above, all others are negative:  Constitutional:  Activity change, fatigue Cardiovascular: palpitation Ear/Nose/Throat:  Facial swelling Skin:  Eyes:   Respiratory:   Gastroitestinal:   Genitourinary:  Hematology/Lymphatic:  anemia Endocrine: cold intolerence Musculoskeletal:  Joint pain, back pain, aching muscles, walking difficulty, neck pain, neck stiffness Allergy/Immunology:  Env Allergies Neurological:  Memory loss, numbness, weakness Psychiatric: confusion, nervous, anxious Sleep: frequent waking, daytime sleepiness, snoring  The following represents the patient's updated allergies and side effects list: Allergies  Allergen Reactions  . Erythromycin Ethylsuccinate Hives and Other (See Comments)  . Keflex [Cephalexin] Hives and Other (See Comments)  . Latex   . Penicillins Other (See Comments)  . Sulfa Antibiotics Other (See Comments)    The neurologically relevant items on the patient's problem list were reviewed on today's visit.  Neurologic Examination  A problem focused neurological exam (12 or more points of the single system  neurologic examination, vital signs counts as 1 point, cranial nerves count for 8 points) was performed.  Blood pressure 89/61, pulse 81, height 5' (1.524  m), weight 129 lb (58.514 kg).  General - Well nourished, well developed, in no apparent distress.  Ophthalmologic - Sharp disc margins OU.   Cardiovascular - Regular rate and rhythm.  Mental Status -  Level of arousal and orientation to time, place, and person were intact. Language including expression, naming, repetition, comprehension was assessed and found intact. Attention span and concentration were normal. Fund of Knowledge was assessed and was intact.  Cranial Nerves II - XII - II - Visual field intact OU. III, IV, VI - Extraocular movements intact. V - Facial sensation intact bilaterally. VII - Facial movement intact bilaterally. VIII - Hearing & vestibular intact bilaterally. X - Palate elevates symmetrically. XI - Chin turning & shoulder shrug intact bilaterally. XII - Tongue protrusion intact.  Motor Strength - The patient's strength was normal in all extremities except right hand dexterity difficulty and pronator drift was absent.  Bulk was normal and fasciculations were absent.   Motor Tone - Muscle tone was assessed at the neck and appendages and was normal.  Reflexes - The patient's reflexes were 1+ in all extremities and she had no pathological reflexes.  Sensory - Light touch, temperature/pinprick were assessed and were normal.    Coordination - The patient had normal movements in the hands with no ataxia or dysmetria.  Tremor was absent.  Gait and Station - The patient's transfers, posture, gait, station, and turns were observed as normal.   Functional score  mRS = 2   0 - No symptoms.   1 - No significant disability. Able to carry out all usual activities, despite some symptoms.   2 - Slight disability. Able to look after own affairs without assistance, but unable to carry out all previous activities.   3 - Moderate disability. Requires some help, but able to walk unassisted.   4 - Moderately severe disability. Unable to attend to own bodily needs  without assistance, and unable to walk unassisted.   5 - Severe disability. Requires constant nursing care and attention, bedridden, incontinent.   6 - Dead.   NIH Stroke Scale = 0   Data reviewed: I personally reviewed the images and agree with the radiology interpretations.  Mr Brain Wo Contrast 06/03/2015 Acute small LEFT frontal lobe (precentral gyrus, MCA territory) infarct. Mild chronic small vessel ischemic disease.   2D Echocardiogram  - Left ventricle: The cavity size was normal. Systolic function wasnormal. The estimated ejection fraction was in the range of 60%to 65%. Wall motion was normal; there were no regional wallmotion abnormalities. - Mitral valve: There was mild regurgitation. - Tricuspid valve: There was mild regurgitation. - Pulmonic valve: Poorly visualized. - Pulmonary arteries: PA peak pressure: 35 mm Hg (S). Impressions: - There was no evidence of a vegetation.  CTA head and neck -  1. 7 mm filling defect in the distal left common carotid artery consistent with a small focus of thrombus. 2. Otherwise unremarkable appearance of the cervical carotid and vertebral arteries. 3. No major intracranial arterial branch occlusion or significant stenosis. Slight attenuation of distal left MCA branch vessels in the region of the left frontal infarct. 4. Extensive right lung consolidation, incompletely visualized. Small right pleural effusion.  TEE - not able to perform due to esophagus stricture.  Component     Latest Ref Rng 06/03/2015 06/04/2015  Cholesterol  0 - 200 mg/dL 95   Triglycerides     <150 mg/dL 72   HDL Cholesterol     >40 mg/dL 16 (L)   Total CHOL/HDL Ratio      5.9   VLDL     0 - 40 mg/dL 14   LDL (calc)     0 - 99 mg/dL 65   Beta-2 Glycoprotein I Ab, IgG     0 - 20 GPI IgG units  <9  Beta-2-Glycoprotein I IgM     0 - 32 GPI IgM units  <9  Beta-2-Glycoprotein I IgA     0 - 25 GPI IgA units  <9  Anticardiolipin Ab,IgG,Qn      0 - 14 GPL U/mL  <9  Anticardiolipin Ab,IgM,Qn     0 - 12 MPL U/mL  <9  Anticardiolipin Ab,IgA,Qn     0 - 11 APL U/mL  <9  Cytoplasmic (C-ANCA)     Neg:<1:20 titer  <1:20  P-ANCA     Neg:<1:20 titer  <1:20  Atypical P-ANCA titer     Neg:<1:20 titer  <1:20  Hemoglobin A1C     4.8 - 5.6 %  6.6 (H)  Mean Plasma Glucose       143  Homocysteine     0.0 - 15.0 umol/L  5.0  SSA (Ro) (ENA) Antibody, IgG     0.0 - 0.9 AI  >8.0 (H)  SSB (La) (ENA) Antibody, IgG     0.0 - 0.9 AI  >8.0 (H)  ds DNA Ab     0 - 9 IU/mL  15 (H)  RA Latex Turbid.     0.0 - 13.9 IU/mL  10.3  ANA Ab, IFA       Negative    Assessment: As you may recall, she is a 60 y.o. Caucasian female with PMH of Sjogren's disease, seizure, hypothyroidism and dysphagia, and flu 4 days PTA who was admitted on 05/29/15 for weakness and altered mental status. Code stroke called on ER arrival due to right facial droop and flaccid paralysis R arm. In the ED she was found to have pneumonia, sepsis, septic shock and hypooxic respiratory failure and was intubated. Blood culture showed positive for strep pyogens. CSF negative for CNS infection. MRI showed left MCA embolic stroke. Concerning for endocarditis. CTA head and neck showed left CCA thrombus, but no mycotic aneurysm. CTA neck also showed extensive RUL consolidation. EKG also found to have aflutter with variable A-V block as well as SVT. Therefore, due to CCA thrombus and aflutter and without mycotic aneurysm, heparin drip was started. Found to have increased LUE muscle tone, EEG showed left frontal spike activity, but repeat EEG negative. LDL 65 and A1C 6.6. Hypercoagulable work up negative and autoimmune work up showed elevated SSA and SSB and dsDNA. 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 TTE showed EF 60-65% no vegetation. She was put on rocephin for 6 weeks for empirical endocarditis treatment. Had dysphagia and PEG placed. Heparin IV switched to ASA  and plavix on discharge due to hematuria which was resolved on discharge and aflutter felt like transient due to infection by Dr. [place] Man. However, pt still has left CCA thrombus which should be treated with anticoagulation as far as I am concerned. She was also started with plaquenil and prednisone for Sjogern's on discharge. During the interval time, the patient had dramatic recovery. She only has right hand dexterity difficulty currently. Her PEG has removed. She is now  on eliquis, following with cardiologist Dr. Clayborn Bigness in [place]. She also follows with her rhematologist for Sjogren's and now on plequenil.   Plan:  - continue eliquis and lipitor for stroke prevention for now. - repeat CT angio head and neck at [facility]. May consider off [facility] if thrombus resolved on repeat CTA and aflutter is truly transient. Will need input from her cardiologist. - continue OT for right hand weakness - follow up with cardiology, rheumatology and Dr. Martina Sinner - Follow up with your primary care physician for stroke risk factor modification. Recommend maintain blood pressure goal <130/80, diabetes with hemoglobin A1c goal below 6.5% and lipids with LDL cholesterol goal below 70 mg/dL.  - check BP at home and record - keep hydrated.  - follow up in 3 months.   I spent more than 25 minutes of face to face time with the patient. Greater than 50% of time was spent in counseling and coordination of care. We discussed needs of repeat CTA head and neck, input from cardiology and continue anticoagulation for now.   Orders Placed This Encounter  Procedures  . CT Angio Neck W/Cm &/Or Wo/Cm    Standing Status: Future     Number of Occurrences:      Standing Expiration Date: 11/17/2016    Order Specific Question:  If indicated for the ordered procedure, I authorize the administration of contrast media per Radiology protocol    Answer:  Yes    Order Specific Question:  Reason for Exam (SYMPTOM  OR DIAGNOSIS REQUIRED)     Answer:  left CCA thrombus in 05/2015    Order Specific Question:  Preferred imaging location?    Answer:  [facility]  . CT Angio Head W/Cm &/Or Wo Cm    Standing Status: Future     Number of Occurrences:      Standing Expiration Date: 11/17/2016    Order Specific Question:  If indicated for the ordered procedure, I authorize the administration of contrast media per Radiology protocol    Answer:  Yes    Order Specific Question:  Reason for Exam (SYMPTOM  OR DIAGNOSIS REQUIRED)    Answer:  left CCA thrombus in 05/2015    Order Specific Question:  Preferred imaging location?    Answer:  [facility] ordered this encounter  Medications  . apixaban (ELIQUIS) 5 MG TABS tablet    Sig: Take 5 mg by mouth 2 (two) times daily.  Marland Kitchen levothyroxine (SYNTHROID, LEVOTHROID) 100 MCG tablet    Sig: Take 100 mcg by mouth daily.    Patient Instructions  - continue eliquis and lipitor for stroke prevention - repeat CT angio at [facility] - continue OT for right hand weakness - follow up with cardiology, rheumatology and Dr. Martina Sinner - Follow up with your primary care physician for stroke risk factor modification. Recommend maintain blood pressure goal <130/80, diabetes with hemoglobin A1c goal below 6.5% and lipids with LDL cholesterol goal below 70 mg/dL.  - check BP at home and record - keep hydrated.  - follow up in 3 months.     Rosalin Hawking, MD PhD [facility] [address], [place], [place] [address] [phone]

## 2015-09-20 ENCOUNTER — Ambulatory Visit: Payer: BC Managed Care – PPO | Admitting: Occupational Therapy

## 2015-09-20 DIAGNOSIS — R278 Other lack of coordination: Secondary | ICD-10-CM

## 2015-09-20 DIAGNOSIS — M6281 Muscle weakness (generalized): Secondary | ICD-10-CM

## 2015-09-20 NOTE — Therapy (Signed)
[place] MAIN [facility] SERVICES [address] [place], [place], [address] Phone: [phone]   Fax:  [phone]  Occupational Therapy Treatment  Patient Details  Name: [name] [name] MRN: [id number] Date of Birth: 09-Sep-1955 Referring Provider: Read Drivers  Encounter Date: 09/20/2015      OT End of Session - 09/20/15 1616    Visit Number 21   Number of Visits 30   Date for OT Re-Evaluation 10/03/15   Authorization Type 30   OT Start Time 1535   OT Stop Time 1617   OT Time Calculation (min) 42 min   Activity Tolerance Patient tolerated treatment well   Behavior During Therapy [facility] for tasks assessed/performed      Past Medical History  Diagnosis Date  . Hypothyroidism   . Seizures ([place])   . Adenopathy   . Scoliosis   . Spleen anomaly   . Anemia   . Sjogren's disease ([place])   . Raynaud disease   . Shingles   . Osteopenia   . Stroke [facility]) 20/2017    Past Surgical History  Procedure Laterality Date  . Tonsillectomy    . Breast cyst aspiration Right 1990's    neg  . Foot surgery Right   . Vocal cord surgery  1992    nodules    There were no vitals filed for this visit.      Subjective Assessment - 09/20/15 1706    Subjective  Pt. reports having had an apppointment with the psychological assessment portion of the social security application.   Pertinent History Brigid Chase is a 60 y.o.right handed female with history of Sjogrens disease/rheumatoid arthritis, noted history of seizure but not on antiepileptic meds and dysphagia. Patient diagnosed 4 days prior with an placed on Tamiflu. Lives with spouse independent prior to admission. 2 level home with bedroom on main level. 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 Presented 05/29/2015 with right side weakness and gait instability. Patient with severe restrictive airway distress when he presented to the ED with oxygen saturations in the 70s requiring nonrebreathing  mask and later intubated with slow extubation follow-up per critical care pulmonary services. Chest x-ray showed large right lung consolidative changes. CT angiogram chest no evidence of pulmonary emboli. Cranial CT scan negative. 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 Echocardiogram with ejection fraction of 65% no wall motion abnormalities. 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 EEG was negative for seizure. Patient did not receive TPA. Blood cultures showed group A strep pansensitive 05/30/2015 and currently maintained on Rocephin with droplet contact precautions   Patient Stated Goals To regain use of her right hand.   Currently in Pain? No/denies   Pain Score 0-No pain      OT TREATMENT    Neuro muscular re-education:  Pt. Worked on improving [facility] skills with emphasis on translatory movements of the hand. Pt. Worked on grasping and manipulating objects, storing 1/4" pegs in the ulnar aspect of the hand, and moving the objects from the palm to the tip of fingers and thumb. Pt. Worked on grasping with thumb opposition to the tip of 2nd through 5th digits.   Therapeutic Exercise:   Pt. Worked on the UE strengthening with the SciFit for 8 min. With constant monitoring of right hand on the handle.Pt. Was able to maintain grasp on handle through distraction with reverse motion every 2 min. Pt. Worked on 3#  dumbell ex for elbow flexion/extension, forearm supination/pronation, wrist flexion/extension, radial dev. And ulnar dev.                          OT Education - 09/20/15 1616    Education provided Yes   Person(s) Educated Patient   Methods Explanation;Demonstration   Comprehension Verbalized understanding             OT Long Term Goals - 09/17/15 1140    OT LONG TERM GOAL #1   Title Pt. will improve right shoulder flexion strength by 2 mm grades to assist with reaching into cabinetry.    Baseline Pt. is unable   Time 12   Period Weeks   Status On-going   OT LONG TERM GOAL #2   Title Pt. will improve right grip fisting with digit flexion to [facility] improved by 2 cm in preparation for grasping/holding ADL grooming and feeding items.   Baseline limited digit fisting flexion to [facility].   Time 12   Status On-going   OT LONG TERM GOAL #3   Title Pt. will improve right elbow flexion and extension strength by 2 mm grades to be able to apply deoderant and wash left axilla region.   Baseline Pt. requires assist to apply deoderant and wash left axilla.   Time 12   Period Weeks   Status Achieved   OT LONG TERM GOAL #4   Title Pt. will improve right shoulder abduction by 10 degrees to assist with UE dressing.   Baseline Pt. requires assist   Time 12   Period Weeks   Status Achieved   OT LONG TERM GOAL #5   Title Pt. will be independent with light meal prep.   Baseline Pt. requires assist   Time 12   Period Weeks   Status On-going   Long Term Additional Goals   Additional Long Term Goals Yes   OT LONG TERM GOAL #6   Title Pt. will improve right lateral and 3 pt. pinch strength by 3# to be able to grasp and hold utensils during self-feeding.   Baseline Pt. is unable to use her right hand to hold the utensils. #pt. pinch strength: 5#, Lateral pinch strength 4#   Time 12   Period Weeks   Status On-going   OT LONG TERM GOAL #7   Title pt. will improve Right grip strength by 5# to be able to open jars, and container lids.   Baseline Pt. is unable to open jars and lids with right hand. Right grip strength: 8#   Time 12   Period Weeks   Status On-going   OT LONG TERM GOAL #8   Title Pt. will  a 3 sentence paragragh efficiently with 75% legiblity.    Baseline Pt. is a ble to print name legibly with increased time.   Time 12   Period Weeks   Status On-going   OT LONG TERM GOAL  #9   Baseline Pt. will be indepndent with applying lotion over her left shoulder and scapula region with  her right hand.   Time 12   Period Weeks   Status New               Plan - 09/20/15 1708    Clinical Impression Statement Pt. continues to work on improving RUE Strength, grip strength, and coordination. Pt. was able to tolerate Scifit ex., and dumbbell ex. for the RUE and hand. Pt. continues to present  with impaired translatory movements of the hand while working to move objects within the hand, and change position of items in the hand for ADLs    OT Frequency 2x / week   OT Duration 12 weeks   OT Treatment/Interventions Self-care/ADL training;Electrical Stimulation;Moist Heat;Manual Therapy;DME and/or AE instruction;Therapeutic activities;Passive range of motion;Ultrasound;Therapeutic exercises;Patient/family education;Neuromuscular education;Therapeutic exercise;Visual/perceptual remediation/compensation;Balance training   Consulted and Agree with Plan of Care Patient      Patient will benefit from skilled therapeutic intervention in order to improve the following deficits and impairments:  Decreased balance, Decreased strength, Decreased coordination, Decreased activity tolerance, Decreased range of motion, Impaired UE functional use, Decreased knowledge of use of DME, Decreased endurance, Impaired tone  Visit Diagnosis: Muscle weakness (generalized)  Other lack of coordination    Problem List Patient Active Problem List   Diagnosis Date Noted  . 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 Atrial flutter, unspecified 09/18/2015  . Sjogren's disease ([place]) 09/18/2015  . Acute lower UTI   . Ischemic stroke of frontal lobe ([place]) 06/16/2015  . Dysphagia as late effect of cerebrovascular disease   . Hemiparesis affecting dominant side as late effect of stroke ([place])   . Esophageal stricture   . Bacteremia   . Atrial flutter ([place])   . Rheumatoid arthritis ([place])   . Hyperglycemia   . Thyroid activity decreased   . Infection due to Streptococcus pyogenes 06/12/2015  . Severe  sepsis with septic shock ([place]) 06/12/2015  . Hematuria 06/12/2015  . Dysphagia 06/12/2015  . 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 CVA (cerebral infarction)   . Acute respiratory failure ([place])   . Encounter for diagnostic procedure   . Ventilator dependence ([place])   . Carotid thromboses   . HLD (hyperlipidemia)   . Respiratory failure ([place])   . Stroke (cerebrum) ([place])   . Research study patient   . Endotracheal tube present   . Endotracheally intubated   . Encounter for feeding tube placement   . Altered mental state   . Weakness   . Sepsis ([place]) 05/30/2015  . CAP (community acquired pneumonia)   . Encephalopathy   . Encounter for central line placement   . Hypoxia   . Lactic acidosis    Harrel Carina, MS, OTR/L  Harrel Carina 09/20/2015, 5:26 PM  [place] MAIN [facility] SERVICES [address] [place], [place], [address] Phone: [phone]   Fax:  [phone]  Name: [name] [name] MRN: [id number] Date of Birth: 13-Jun-1955

## 2015-09-20 NOTE — Patient Instructions (Signed)
OT TREATMENT    Neuro muscular re-education:  Pt. Worked on improving [facility] skills with emphasis on translatory movements of the hand. Pt. Worked on grasping and manipulating objects, storing 1/4" pegs in the ulnar aspect of the hand, and moving the objects from the palm to the tip of fingers and thumb. Pt. Worked on grasping with thumb opposition to the tip of 2nd through 5th digits.   Therapeutic Exercise:   Pt. Worked on the UE strengthening with the SciFit for 8 min. With constant monitoring of right hand on the handle.Pt. Was able to maintain grasp on handle through distraction with reverse motion every 2 min. Pt. Worked on 3# dumbell ex for elbow flexion/extension, forearm supination/pronation, wrist flexion/extension, radial dev. And ulnar dev.

## 2015-09-22 ENCOUNTER — Ambulatory Visit: Payer: BC Managed Care – PPO | Admitting: Occupational Therapy

## 2015-09-22 DIAGNOSIS — M6281 Muscle weakness (generalized): Secondary | ICD-10-CM

## 2015-09-22 DIAGNOSIS — R278 Other lack of coordination: Secondary | ICD-10-CM | POA: Diagnosis not present

## 2015-09-22 NOTE — Therapy (Signed)
[place] MAIN [facility] SERVICES [address] [place], [place], [address] Phone: [phone]   Fax:  [phone]  Occupational Therapy Treatment  Patient Details  Name: [name] [name] MRN: [id number] Date of Birth: Apr 05, 1956 Referring Provider: Read Drivers  Encounter Date: 09/22/2015      OT End of Session - 09/22/15 1533    Visit Number 22   Number of Visits 30   Date for OT Re-Evaluation 10/03/15   Authorization Type 30   OT Start Time 1345   OT Stop Time 1432   OT Time Calculation (min) 47 min   Activity Tolerance Patient tolerated treatment well   Behavior During Therapy [facility] for tasks assessed/performed      Past Medical History  Diagnosis Date  . Hypothyroidism   . Seizures ([place])   . Adenopathy   . Scoliosis   . Spleen anomaly   . Anemia   . Sjogren's disease ([place])   . Raynaud disease   . Shingles   . Osteopenia   . Stroke [facility]) 20/2017    Past Surgical History  Procedure Laterality Date  . Tonsillectomy    . Breast cyst aspiration Right 1990's    neg  . Foot surgery Right   . Vocal cord surgery  1992    nodules    There were no vitals filed for this visit.      Subjective Assessment - 09/22/15 1528    Subjective  Pt. reports she will follow up with the cardiologist this week about her nuclear stress test.   Patient is accompained by: Family member   Pertinent History [name] [name] is a 60 y.o.right handed female with history of Sjogrens disease/rheumatoid arthritis, noted history of seizure but not on antiepileptic meds and dysphagia. Patient diagnosed 4 days prior with an placed on Tamiflu. Lives with spouse independent prior to admission. 2 level home with bedroom on main level. 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 Presented 05/29/2015 with right side weakness and gait instability. Patient with severe restrictive airway distress when he presented to the ED with oxygen saturations in the 70s  requiring nonrebreathing mask and later intubated with slow extubation follow-up per critical care pulmonary services. Chest x-ray showed large right lung consolidative changes. CT angiogram chest no evidence of pulmonary emboli. Cranial CT scan negative. 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 Echocardiogram with ejection fraction of 65% no wall motion abnormalities. 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 EEG was negative for seizure. Patient did not receive TPA. Blood cultures showed group A strep pansensitive 05/30/2015 and currently maintained on Rocephin with droplet contact precautions   Patient Stated Goals To regain use of her right hand.   Currently in Pain? No/denies   Pain Score 0-No pain         .OT TREATMENT   Neuro muscular re-education:  Pt. worked on grasping 1/2" washers and placing them at various angles on the wooden stick tower, as well as stacking them. Pt. Worked on grasping with the 2nd digit and thumb while storing the washers in the ulnar aspect of her hand. Pt. had difficulty with moving the washers through her hand.   Therapeutic Exercise:  Pt. Worked on the [employer] for 8 min. forward and reverse motion. With constant monitoring of the right hand on the handle. Pt. Was able to maintain grip on handle the full duration with distraction. 3# dumbbell ex.  for elbow flexion and extension, forearm supination/pronation, wrist flexion/extension, and radial deviation. Pt. requires rest breaks and verbal cues for proper technique, hand position, and support.                         OT Education - 09/22/15 1533    Education provided Yes   Person(s) Educated Patient   Methods Explanation;Demonstration   Comprehension Verbalized understanding             OT Long Term Goals - 09/17/15 1140    OT LONG TERM GOAL #1   Title Pt. will improve right shoulder flexion strength by 2 mm grades  to assist with reaching into cabinetry.   Baseline Pt. is unable   Time 12   Period Weeks   Status On-going   OT LONG TERM GOAL #2   Title Pt. will improve right grip fisting with digit flexion to [facility] improved by 2 cm in preparation for grasping/holding ADL grooming and feeding items.   Baseline limited digit fisting flexion to [facility].   Time 12   Status On-going   OT LONG TERM GOAL #3   Title Pt. will improve right elbow flexion and extension strength by 2 mm grades to be able to apply deoderant and wash left axilla region.   Baseline Pt. requires assist to apply deoderant and wash left axilla.   Time 12   Period Weeks   Status Achieved   OT LONG TERM GOAL #4   Title Pt. will improve right shoulder abduction by 10 degrees to assist with UE dressing.   Baseline Pt. requires assist   Time 12   Period Weeks   Status Achieved   OT LONG TERM GOAL #5   Title Pt. will be independent with light meal prep.   Baseline Pt. requires assist   Time 12   Period Weeks   Status On-going   Long Term Additional Goals   Additional Long Term Goals Yes   OT LONG TERM GOAL #6   Title Pt. will improve right lateral and 3 pt. pinch strength by 3# to be able to grasp and hold utensils during self-feeding.   Baseline Pt. is unable to use her right hand to hold the utensils. #pt. pinch strength: 5#, Lateral pinch strength 4#   Time 12   Period Weeks   Status On-going   OT LONG TERM GOAL #7   Title pt. will improve Right grip strength by 5# to be able to open jars, and container lids.   Baseline Pt. is unable to open jars and lids with right hand. Right grip strength: 8#   Time 12   Period Weeks   Status On-going   OT LONG TERM GOAL #8   Title Pt. will  a 3 sentence paragragh efficiently with 75% legiblity.    Baseline Pt. is a ble to print name legibly with increased time.   Time 12   Period Weeks   Status On-going   OT LONG TERM GOAL  #9   Baseline Pt. will be indepndent with applying lotion over  her left shoulder and scapula region with her right hand.   Time 12   Period Weeks   Status New               Plan - 09/22/15 1534    Clinical Impression Statement Pt. is making steady progress with RUE, grip strength, and coordination. Pt. continues to require work on improving overall strength  for carrying items during ADLs/IADLs, and to work on improving translatory movements of the hand , and moving objects within her hand in preparation for functional use of the hand.     Rehab Potential Good   OT Frequency 2x / week   OT Duration 12 weeks   OT Treatment/Interventions Self-care/ADL training;Electrical Stimulation;Moist Heat;Manual Therapy;DME and/or AE instruction;Therapeutic activities;Passive range of motion;Ultrasound;Therapeutic exercises;Patient/family education;Neuromuscular education;Therapeutic exercise;Visual/perceptual remediation/compensation;Balance training   Consulted and Agree with Plan of Care Patient      Patient will benefit from skilled therapeutic intervention in order to improve the following deficits and impairments:  Decreased balance, Decreased strength, Decreased coordination, Decreased activity tolerance, Decreased range of motion, Impaired UE functional use, Decreased knowledge of use of DME, Decreased endurance, Impaired tone  Visit Diagnosis: Muscle weakness (generalized)    Problem List Patient Active Problem List   Diagnosis Date Noted  . 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 Atrial flutter, unspecified 09/18/2015  . Sjogren's disease ([place]) 09/18/2015  . Acute lower UTI   . Ischemic stroke of frontal lobe ([place]) 06/16/2015  . Dysphagia as late effect of cerebrovascular disease   . Hemiparesis affecting dominant side as late effect of stroke ([place])   . Esophageal stricture   . Bacteremia   . Atrial flutter ([place])   . Rheumatoid arthritis ([place])   . Hyperglycemia   . Thyroid activity decreased   . Infection due to Streptococcus pyogenes  06/12/2015  . Severe sepsis with septic shock ([place]) 06/12/2015  . Hematuria 06/12/2015  . Dysphagia 06/12/2015  . 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 CVA (cerebral infarction)   . Acute respiratory failure ([place])   . Encounter for diagnostic procedure   . Ventilator dependence ([place])   . Carotid thromboses   . HLD (hyperlipidemia)   . Respiratory failure ([place])   . Stroke (cerebrum) ([place])   . Research study patient   . Endotracheal tube present   . Endotracheally intubated   . Encounter for feeding tube placement   . Altered mental state   . Weakness   . Sepsis ([place]) 05/30/2015  . CAP (community acquired pneumonia)   . Encephalopathy   . Encounter for central line placement   . Hypoxia   . Lactic acidosis    Harrel Carina, MS, OTR/L  Harrel Carina 09/22/2015, 3:44 PM  [place] MAIN [facility] SERVICES [address] [place], [place], [address] Phone: [phone]   Fax:  [phone]  Name: Rashad Meddings MRN: [id number] Date of Birth: 1956/04/04

## 2015-09-22 NOTE — Patient Instructions (Signed)
OT TREATMENT     Neuro muscular re-education: Pt. worked on grasping 1/2" washers and placing them at various angles on the wooden stick tower, as well as stacking them. Pt. Worked on grasping with the 2nd digit and thumb while storing the washers in the ulnar aspect of her hand. Pt. had difficulty with moving the washers through her hand.   Therapeutic Exercise: Pt. Worked on the [employer] for 8 min. forward and reverse motion. With constant monitoring of the right hand on the handle. Pt. Was able to maintain grip on handle the full duration with distraction. 3# dumbbell ex. for elbow flexion and extension, forearm supination/pronation, wrist flexion/extension, and radial deviation. Pt. requires rest breaks and verbal cues for proper technique, hand position, and support.

## 2015-09-27 ENCOUNTER — Ambulatory Visit: Payer: BC Managed Care – PPO | Admitting: Occupational Therapy

## 2015-09-27 DIAGNOSIS — R278 Other lack of coordination: Secondary | ICD-10-CM | POA: Diagnosis not present

## 2015-09-27 DIAGNOSIS — M6281 Muscle weakness (generalized): Secondary | ICD-10-CM

## 2015-09-27 NOTE — Patient Instructions (Signed)
OT TREATMENT    Neuro muscular re-education:  Pt. Worked on alternating repetitive hand movements forward, out, up and down with cues for the thumb position. Pt. worked on translatory hand movements with the right hand. Pt. Worked on moving 1" circular objects with isolating the thumb, followed by isolating the 4th and 5th digits. Pt. Worked on grasping and placing small 1/8" objects and storing them in her palm.   Therapeutic Exercise:   Pt. worked on the [employer]  For 8 min with constant monitoring of the right hand on the handle. Pt. Was able to sustain grip the full duration with distraction. 2# dumbbell ex. for elbow flexion and extension,  2# for forearm supination/pronation, wrist flexion/extension, and radial deviation. Cues to stabilize the forearm. Pt. Worked on the wrist maze with 2# weight, and cues for elbow position.

## 2015-09-27 NOTE — Therapy (Signed)
[place] MAIN [facility] SERVICES [address] [place], [place], [address] Phone: [phone]   Fax:  [phone]  Occupational Therapy Treatment  Patient Details  Name: [name] [name] MRN: [id number] Date of Birth: 1955/09/06 Referring Provider: Read Drivers  Encounter Date: 09/27/2015      OT End of Session - 09/27/15 1713    Visit Number 23   Number of Visits 30   Date for OT Re-Evaluation 10/03/15   Authorization Type 30   Activity Tolerance Patient tolerated treatment well   Behavior During Therapy [facility] for tasks assessed/performed      Past Medical History  Diagnosis Date  . Hypothyroidism   . Seizures ([place])   . Adenopathy   . Scoliosis   . Spleen anomaly   . Anemia   . Sjogren's disease ([place])   . Raynaud disease   . Shingles   . Osteopenia   . Stroke [facility]) 20/2017    Past Surgical History  Procedure Laterality Date  . Tonsillectomy    . Breast cyst aspiration Right 1990's    neg  . Foot surgery Right   . Vocal cord surgery  1992    nodules    There were no vitals filed for this visit.      Subjective Assessment - 09/27/15 1712    Subjective  pt. reports she is going to have a CT Scan.   Patient is accompained by: Family member   Pertinent History [name] [name] is a 60 y.o.right handed female with history of Sjogrens disease/rheumatoid arthritis, noted history of seizure but not on antiepileptic meds and dysphagia. Patient diagnosed 4 days prior with an placed on Tamiflu. Lives with spouse independent prior to admission. 2 level home with bedroom on main level. 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 Presented 05/29/2015 with right side weakness and gait instability. Patient with severe restrictive airway distress when he presented to the ED with oxygen saturations in the 70s requiring nonrebreathing mask and later intubated with slow extubation follow-up per critical care pulmonary services. Chest x-ray  showed large right lung consolidative changes. CT angiogram chest no evidence of pulmonary emboli. Cranial CT scan negative. 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 Echocardiogram with ejection fraction of 65% no wall motion abnormalities. 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 EEG was negative for seizure. Patient did not receive TPA. Blood cultures showed group A strep pansensitive 05/30/2015 and currently maintained on Rocephin with droplet contact precautions   Patient Stated Goals To regain use of her right hand.   Currently in Pain? No/denies   Pain Score 0-No pain      OT TREATMENT    Neuro muscular re-education:  Pt. Worked on alternating repetitive hand movements forward, out, up and down with cues for the thumb position. Pt. worked on translatory hand movements with the right hand. Pt. Worked on moving 1" circular objects with isolating the thumb, followed by isolating the 4th and 5th digits. Pt. Worked on grasping and placing small 1/8" objects and storing them in her palm.   Therapeutic Exercise:   Pt. worked on the [employer]  For 8 min with constant monitoring of the right hand on the handle. Pt. Was able to sustain grip the full duration with distraction. 2# dumbbell ex. for elbow flexion and extension,  2# for forearm supination/pronation, wrist flexion/extension, and radial deviation. Cues to stabilize the forearm. Pt. Worked on  the wrist maze with 2# weight, and cues for elbow position.                                  OT Long Term Goals - 09/17/15 1140    OT LONG TERM GOAL #1   Title Pt. will improve right shoulder flexion strength by 2 mm grades to assist with reaching into cabinetry.   Baseline Pt. is unable   Time 12   Period Weeks   Status On-going   OT LONG TERM GOAL #2   Title Pt. will improve right grip fisting with digit flexion to [facility] improved by 2 cm in preparation for  grasping/holding ADL grooming and feeding items.   Baseline limited digit fisting flexion to [facility].   Time 12   Status On-going   OT LONG TERM GOAL #3   Title Pt. will improve right elbow flexion and extension strength by 2 mm grades to be able to apply deoderant and wash left axilla region.   Baseline Pt. requires assist to apply deoderant and wash left axilla.   Time 12   Period Weeks   Status Achieved   OT LONG TERM GOAL #4   Title Pt. will improve right shoulder abduction by 10 degrees to assist with UE dressing.   Baseline Pt. requires assist   Time 12   Period Weeks   Status Achieved   OT LONG TERM GOAL #5   Title Pt. will be independent with light meal prep.   Baseline Pt. requires assist   Time 12   Period Weeks   Status On-going   Long Term Additional Goals   Additional Long Term Goals Yes   OT LONG TERM GOAL #6   Title Pt. will improve right lateral and 3 pt. pinch strength by 3# to be able to grasp and hold utensils during self-feeding.   Baseline Pt. is unable to use her right hand to hold the utensils. #pt. pinch strength: 5#, Lateral pinch strength 4#   Time 12   Period Weeks   Status On-going   OT LONG TERM GOAL #7   Title pt. will improve Right grip strength by 5# to be able to open jars, and container lids.   Baseline Pt. is unable to open jars and lids with right hand. Right grip strength: 8#   Time 12   Period Weeks   Status On-going   OT LONG TERM GOAL #8   Title Pt. will  a 3 sentence paragragh efficiently with 75% legiblity.    Baseline Pt. is a ble to print name legibly with increased time.   Time 12   Period Weeks   Status On-going   OT LONG TERM GOAL  #9   Baseline Pt. will be indepndent with applying lotion over her left shoulder and scapula region with her right hand.   Time 12   Period Weeks   Status New               Plan - 09/27/15 1713    Clinical Impression Statement Pt. continues to make progress with her UE strength, Grip  strength, and coordination. Pt. continues to work on improving translatory movements of the hand, moving objects within her hand, grasping, and carrying iitems for ADLs.   Rehab Potential Good   OT Frequency 2x / week   OT Duration 12 weeks   OT Treatment/Interventions Self-care/ADL training;Electrical Stimulation;Moist Heat;Manual Therapy;DME and/or  AE instruction;Therapeutic activities;Passive range of motion;Ultrasound;Therapeutic exercises;Patient/family education;Neuromuscular education;Therapeutic exercise;Visual/perceptual remediation/compensation;Balance training   Consulted and Agree with Plan of Care Patient      Patient will benefit from skilled therapeutic intervention in order to improve the following deficits and impairments:  Decreased balance, Decreased strength, Decreased coordination, Decreased activity tolerance, Decreased range of motion, Impaired UE functional use, Decreased knowledge of use of DME, Decreased endurance, Impaired tone  Visit Diagnosis: Muscle weakness (generalized)  Other lack of coordination    Problem List Patient Active Problem List   Diagnosis Date Noted  . 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 Atrial flutter, unspecified 09/18/2015  . Sjogren's disease ([place]) 09/18/2015  . Acute lower UTI   . Ischemic stroke of frontal lobe ([place]) 06/16/2015  . Dysphagia as late effect of cerebrovascular disease   . Hemiparesis affecting dominant side as late effect of stroke ([place])   . Esophageal stricture   . Bacteremia   . Atrial flutter ([place])   . Rheumatoid arthritis ([place])   . Hyperglycemia   . Thyroid activity decreased   . Infection due to Streptococcus pyogenes 06/12/2015  . Severe sepsis with septic shock ([place]) 06/12/2015  . Hematuria 06/12/2015  . Dysphagia 06/12/2015  . 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 CVA (cerebral infarction)   . Acute respiratory failure ([place])   . Encounter for diagnostic procedure   . Ventilator dependence ([place])   .  Carotid thromboses   . HLD (hyperlipidemia)   . Respiratory failure ([place])   . Stroke (cerebrum) ([place])   . Research study patient   . Endotracheal tube present   . Endotracheally intubated   . Encounter for feeding tube placement   . Altered mental state   . Weakness   . Sepsis ([place]) 05/30/2015  . CAP (community acquired pneumonia)   . Encephalopathy   . Encounter for central line placement   . Hypoxia   . Lactic acidosis    Harrel Carina, MS, OTR/L  Harrel Carina 09/27/2015, 5:28 PM  [place] MAIN [facility] SERVICES [address] [place], [place], [address] Phone: [phone]   Fax:  [phone]  Name: [name] [name] MRN: [id number] Date of Birth: Sep 03, 1955

## 2015-09-28 ENCOUNTER — Ambulatory Visit
Admission: RE | Admit: 2015-09-28 | Discharge: 2015-09-28 | Disposition: A | Discharge status: Home / Self Care | Payer: BC Managed Care – PPO | Source: Ambulatory Visit | Attending: Neurology | Admitting: Neurology

## 2015-09-28 ENCOUNTER — Ambulatory Visit: Payer: BC Managed Care – PPO

## 2015-09-28 DIAGNOSIS — I639 Cerebral infarction, unspecified: Secondary | ICD-10-CM

## 2015-09-28 LAB — POCT I-STAT CREATININE: CREATININE: 0.6 mg/dL (ref 0.44–1.00)

## 2015-09-28 MED ORDER — IOPAMIDOL (ISOVUE-370) INJECTION 76%
75.0000 mL | Freq: Once | INTRAVENOUS | Status: AC | PRN
  Administered 2015-09-28: 75 mL via INTRAVENOUS

## 2015-09-29 ENCOUNTER — Ambulatory Visit: Payer: BC Managed Care – PPO | Admitting: Occupational Therapy

## 2015-09-29 DIAGNOSIS — R278 Other lack of coordination: Secondary | ICD-10-CM | POA: Diagnosis not present

## 2015-09-29 DIAGNOSIS — M6281 Muscle weakness (generalized): Secondary | ICD-10-CM

## 2015-09-29 NOTE — Therapy (Signed)
[place] MAIN [facility] SERVICES [address] [place], [place], [address] Phone: [phone]   Fax:  [phone]  Occupational Therapy Treatment  Patient Details  Name: [name] [name] MRN: [id number] Date of Birth: December 07, 1955 Referring Provider: Read Drivers  Encounter Date: 09/29/2015      OT End of Session - 09/29/15 1554    Visit Number 24   Number of Visits 30   Date for OT Re-Evaluation 10/03/15   Authorization Type 30   OT Start Time 1355   OT Stop Time 1433   OT Time Calculation (min) 38 min   Activity Tolerance Patient tolerated treatment well   Behavior During Therapy [facility] for tasks assessed/performed      Past Medical History  Diagnosis Date  . Hypothyroidism   . Seizures ([place])   . Adenopathy   . Scoliosis   . Spleen anomaly   . Anemia   . Sjogren's disease ([place])   . Raynaud disease   . Shingles   . Osteopenia   . Stroke [facility]) 20/2017    Past Surgical History  Procedure Laterality Date  . Tonsillectomy    . Breast cyst aspiration Right 1990's    neg  . Foot surgery Right   . Vocal cord surgery  1992    nodules    There were no vitals filed for this visit.      Subjective Assessment - 09/29/15 1551    Subjective  Pt. reports her Neurologist called and reported that the clot is gone. Pt. reports seeing her Rheumatologist who reported that the patient has a history of a small spleen which makes it harder to fight infections.   Patient is accompained by: Family member   Pertinent History [name] [name] is a 60 y.o.right handed female with history of Sjogrens disease/rheumatoid arthritis, noted history of seizure but not on antiepileptic meds and dysphagia. Patient diagnosed 4 days prior with an placed on Tamiflu. Lives with spouse independent prior to admission. 2 level home with bedroom on main level. 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 Presented 05/29/2015 with right side weakness and gait  instability. Patient with severe restrictive airway distress when he presented to the ED with oxygen saturations in the 70s requiring nonrebreathing mask and later intubated with slow extubation follow-up per critical care pulmonary services. Chest x-ray showed large right lung consolidative changes. CT angiogram chest no evidence of pulmonary emboli. Cranial CT scan negative. 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 Echocardiogram with ejection fraction of 65% no wall motion abnormalities. 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 EEG was negative for seizure. Patient did not receive TPA. Blood cultures showed group A strep pansensitive 05/30/2015 and currently maintained on Rocephin with droplet contact precautions   Patient Stated Goals To regain use of her right hand.   Currently in Pain? No/denies   Pain Score 0-No pain         OT TREATMENT    Neuro muscular re-education:   Pt. Worked on activities to improve translatory movements and movements of the hand in preparation for writing. Pt. Worked on grasping and holding pen position, as well as moving the pen between her thumb and 2nd digit. Pt. worked on translatory movements of the hand moving objects with her fingers within her palm. Pt. Worked on [employer], and handwriting. Pt.  Therapeutic Exercise:  Pt. Worked on the [employer] for 8 min. with constant  supervision of the right hand on the handle for the duration. Pt. performed gross gripping with grip strengthener. Pt. worked on sustaining grip while grasping pegs and reaching at various heights. Pt. Was able to clear two boards with the grip strengthener on the 3rd resistive slot with the white resistive spring.                         OT Education - 09/29/15 1553    Education provided Yes   Person(s) Educated Patient   Methods Explanation;Demonstration   Comprehension Verbalized understanding              OT Long Term Goals - 09/17/15 1140    OT LONG TERM GOAL #1   Title Pt. will improve right shoulder flexion strength by 2 mm grades to assist with reaching into cabinetry.   Baseline Pt. is unable   Time 12   Period Weeks   Status On-going   OT LONG TERM GOAL #2   Title Pt. will improve right grip fisting with digit flexion to [facility] improved by 2 cm in preparation for grasping/holding ADL grooming and feeding items.   Baseline limited digit fisting flexion to [facility].   Time 12   Status On-going   OT LONG TERM GOAL #3   Title Pt. will improve right elbow flexion and extension strength by 2 mm grades to be able to apply deoderant and wash left axilla region.   Baseline Pt. requires assist to apply deoderant and wash left axilla.   Time 12   Period Weeks   Status Achieved   OT LONG TERM GOAL #4   Title Pt. will improve right shoulder abduction by 10 degrees to assist with UE dressing.   Baseline Pt. requires assist   Time 12   Period Weeks   Status Achieved   OT LONG TERM GOAL #5   Title Pt. will be independent with light meal prep.   Baseline Pt. requires assist   Time 12   Period Weeks   Status On-going   Long Term Additional Goals   Additional Long Term Goals Yes   OT LONG TERM GOAL #6   Title Pt. will improve right lateral and 3 pt. pinch strength by 3# to be able to grasp and hold utensils during self-feeding.   Baseline Pt. is unable to use her right hand to hold the utensils. #pt. pinch strength: 5#, Lateral pinch strength 4#   Time 12   Period Weeks   Status On-going   OT LONG TERM GOAL #7   Title pt. will improve Right grip strength by 5# to be able to open jars, and container lids.   Baseline Pt. is unable to open jars and lids with right hand. Right grip strength: 8#   Time 12   Period Weeks   Status On-going   OT LONG TERM GOAL #8   Title Pt. will  a 3 sentence paragragh efficiently with 75% legiblity.    Baseline Pt. is a ble to print name legibly  with increased time.   Time 12   Period Weeks   Status On-going   OT LONG TERM GOAL  #9   Baseline Pt. will be indepndent with applying lotion over her left shoulder and scapula region with her right hand.   Time 12   Period Weeks   Status New               Plan - 09/29/15 1543  Clinical Impression Statement Pt. continues to make progress with overall grip strength. Pt. continues to present with impaired [facility] skills, translatory movements, and movements required for printing and handwriting. Pt. presents with limited distal motility in digits needed to move the pen efficiently across the paper.      Rehab Potential Good   OT Frequency 2x / week   OT Duration 12 weeks   OT Treatment/Interventions Self-care/ADL training;Electrical Stimulation;Moist Heat;Manual Therapy;DME and/or AE instruction;Therapeutic activities;Passive range of motion;Ultrasound;Therapeutic exercises;Patient/family education;Neuromuscular education;Therapeutic exercise;Visual/perceptual remediation/compensation;Balance training   Consulted and Agree with Plan of Care Patient      Patient will benefit from skilled therapeutic intervention in order to improve the following deficits and impairments:  Decreased balance, Decreased strength, Decreased coordination, Decreased activity tolerance, Decreased range of motion, Impaired UE functional use, Decreased knowledge of use of DME, Decreased endurance, Impaired tone  Visit Diagnosis: Muscle weakness (generalized)  Other lack of coordination    Problem List Patient Active Problem List   Diagnosis Date Noted  . 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 Atrial flutter, unspecified 09/18/2015  . Sjogren's disease ([place]) 09/18/2015  . Acute lower UTI   . Ischemic stroke of frontal lobe ([place]) 06/16/2015  . Dysphagia as late effect of cerebrovascular disease   . Hemiparesis affecting dominant side as late effect of stroke ([place])   . Esophageal stricture   .  Bacteremia   . Atrial flutter ([place])   . Rheumatoid arthritis ([place])   . Hyperglycemia   . Thyroid activity decreased   . Infection due to Streptococcus pyogenes 06/12/2015  . Severe sepsis with septic shock ([place]) 06/12/2015  . Hematuria 06/12/2015  . Dysphagia 06/12/2015  . 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 CVA (cerebral infarction)   . Acute respiratory failure ([place])   . Encounter for diagnostic procedure   . Ventilator dependence ([place])   . Carotid thromboses   . HLD (hyperlipidemia)   . Respiratory failure ([place])   . Stroke (cerebrum) ([place])   . Research study patient   . Endotracheal tube present   . Endotracheally intubated   . Encounter for feeding tube placement   . Altered mental state   . Weakness   . Sepsis ([place]) 05/30/2015  . CAP (community acquired pneumonia)   . Encephalopathy   . Encounter for central line placement   . Hypoxia   . Lactic acidosis    Harrel Carina, MS, OTR/L   Harrel Carina 09/29/2015, 4:09 PM  [place] MAIN [facility] SERVICES [address] [place], [place], [address] Phone: [phone]   Fax:  [phone]  Name: Donnamaria Koffler MRN: [id number] Date of Birth: Aug 28, 1955

## 2015-09-29 NOTE — Patient Instructions (Signed)
OT TREATMENT    Neuro muscular re-education:   Pt. Worked on activities to improve translatory movements and movements of the hand in preparation for writing. Pt. Worked on grasping and holding pen position, as well as moving the pen between her thumb and 2nd digit. Pt. worked on translatory movements of the hand moving objects with her fingers within her palm. Pt. Worked on [employer], and handwriting. Pt.  Therapeutic Exercise:  Pt. Worked on the [employer] for 8 min. with constant supervision of the right hand on the handle for the duration. Pt. performed gross gripping with grip strengthener. Pt. worked on sustaining grip while grasping pegs and reaching at various heights. Pt. Was able to clear two boards with the grip strengthener on the 3rd resistive slot with the white resistive spring.

## 2015-09-30 ENCOUNTER — Ambulatory Visit (HOSPITAL_BASED_OUTPATIENT_CLINIC_OR_DEPARTMENT_OTHER): Payer: BC Managed Care – PPO | Admitting: Physical Medicine & Rehabilitation

## 2015-09-30 ENCOUNTER — Encounter: Payer: Self-pay | Admitting: Physical Medicine & Rehabilitation

## 2015-09-30 ENCOUNTER — Encounter: Payer: BC Managed Care – PPO | Attending: Physical Medicine & Rehabilitation

## 2015-09-30 VITALS — BP 117/78 | HR 78 | Resp 17

## 2015-09-30 DIAGNOSIS — R569 Unspecified convulsions: Secondary | ICD-10-CM | POA: Diagnosis not present

## 2015-09-30 DIAGNOSIS — E039 Hypothyroidism, unspecified: Secondary | ICD-10-CM | POA: Insufficient documentation

## 2015-09-30 DIAGNOSIS — G5602 Carpal tunnel syndrome, left upper limb: Secondary | ICD-10-CM | POA: Diagnosis not present

## 2015-09-30 DIAGNOSIS — I7300 Raynaud's syndrome without gangrene: Secondary | ICD-10-CM | POA: Diagnosis not present

## 2015-09-30 DIAGNOSIS — Z9889 Other specified postprocedural states: Secondary | ICD-10-CM | POA: Diagnosis not present

## 2015-09-30 DIAGNOSIS — R1310 Dysphagia, unspecified: Secondary | ICD-10-CM | POA: Insufficient documentation

## 2015-09-30 DIAGNOSIS — M8580 Other specified disorders of bone density and structure, unspecified site: Secondary | ICD-10-CM | POA: Diagnosis not present

## 2015-09-30 DIAGNOSIS — B998 Other infectious disease: Secondary | ICD-10-CM | POA: Diagnosis not present

## 2015-09-30 DIAGNOSIS — E785 Hyperlipidemia, unspecified: Secondary | ICD-10-CM | POA: Insufficient documentation

## 2015-09-30 DIAGNOSIS — I4892 Unspecified atrial flutter: Secondary | ICD-10-CM | POA: Diagnosis not present

## 2015-09-30 DIAGNOSIS — M3500 Sicca syndrome, unspecified: Secondary | ICD-10-CM | POA: Diagnosis not present

## 2015-09-30 DIAGNOSIS — M069 Rheumatoid arthritis, unspecified: Secondary | ICD-10-CM | POA: Insufficient documentation

## 2015-09-30 DIAGNOSIS — R531 Weakness: Secondary | ICD-10-CM | POA: Diagnosis present

## 2015-09-30 DIAGNOSIS — I638 Other cerebral infarction: Secondary | ICD-10-CM | POA: Diagnosis present

## 2015-09-30 DIAGNOSIS — K222 Esophageal obstruction: Secondary | ICD-10-CM | POA: Diagnosis not present

## 2015-09-30 NOTE — Patient Instructions (Signed)

## 2015-10-04 ENCOUNTER — Ambulatory Visit: Payer: BC Managed Care – PPO | Admitting: Occupational Therapy

## 2015-10-04 DIAGNOSIS — R278 Other lack of coordination: Secondary | ICD-10-CM | POA: Diagnosis not present

## 2015-10-04 DIAGNOSIS — R29810 Facial weakness: Secondary | ICD-10-CM

## 2015-10-04 NOTE — Therapy (Signed)
[place] MAIN [facility] SERVICES [address] [place], [place], [address] Phone: [phone]   Fax:  [phone]  Occupational Therapy Treatment  Patient Details  Name: [name] [name] MRN: [id number] Date of Birth: 01-15-1956 Referring Provider: Read Drivers  Encounter Date: 10/04/2015      OT End of Session - 10/04/15 1540    Visit Number 25   Number of Visits 30   Date for OT Re-Evaluation 10/03/15   Authorization Type 30   OT Start Time 1448   OT Stop Time 1530   OT Time Calculation (min) 42 min   Activity Tolerance Patient tolerated treatment well   Behavior During Therapy [facility] for tasks assessed/performed      Past Medical History  Diagnosis Date  . Hypothyroidism   . Seizures ([place])   . Adenopathy   . Scoliosis   . Spleen anomaly   . Anemia   . Sjogren's disease ([place])   . Raynaud disease   . Shingles   . Osteopenia   . Stroke [facility]) 20/2017    Past Surgical History  Procedure Laterality Date  . Tonsillectomy    . Breast cyst aspiration Right 1990's    neg  . Foot surgery Right   . Vocal cord surgery  1992    nodules    There were no vitals filed for this visit.      Subjective Assessment - 10/04/15 1539    Currently in Pain? No/denies   Pain Score 0-No pain      OT TREATMENT    Neuro muscular re-education:  Pt. Worked on grasping 1" resistive cubes with the thumb to the tip of the 2nd through 5th digits. Pt. Worked on placing them while isolating the 2nd digit. The board was placed at a vertical angle to encourage wrist extension.   Therapeutic Exercise:  Pt. Worked on the [employer] for 8 min. Total forward and reverse. Pt. Was able to keep the right hand on handle for the entire duration. Constant monitoring of the hand was provided 100% of the time. Pt. performed gross gripping with grip strengthener. Pt. worked on sustaining grip while grasping pegs and reaching at various heights.  Selfcare:  Pt. Worked on  [employer]. Pt. Was able to print with the distal mobility needed in the wrist and fingers when stabilizing the forearm during printing, however had difficulty during cursive handwriting.                           OT Education - 10/04/15 1539    Education provided Yes   Person(s) Educated Patient   Methods Explanation;Demonstration   Comprehension Verbalized understanding             OT Long Term Goals - 09/17/15 1140    OT LONG TERM GOAL #1   Title Pt. will improve right shoulder flexion strength by 2 mm grades to assist with reaching into cabinetry.   Baseline Pt. is unable   Time 12   Period Weeks   Status On-going   OT LONG TERM GOAL #2   Title Pt. will improve right grip fisting with digit flexion to [facility] improved by 2 cm in preparation for grasping/holding ADL grooming and feeding items.   Baseline limited digit fisting flexion to [facility].   Time 12   Status On-going   OT LONG TERM GOAL #3   Title Pt. will improve right elbow flexion and extension strength  by 2 mm grades to be able to apply deoderant and wash left axilla region.   Baseline Pt. requires assist to apply deoderant and wash left axilla.   Time 12   Period Weeks   Status Achieved   OT LONG TERM GOAL #4   Title Pt. will improve right shoulder abduction by 10 degrees to assist with UE dressing.   Baseline Pt. requires assist   Time 12   Period Weeks   Status Achieved   OT LONG TERM GOAL #5   Title Pt. will be independent with light meal prep.   Baseline Pt. requires assist   Time 12   Period Weeks   Status On-going   Long Term Additional Goals   Additional Long Term Goals Yes   OT LONG TERM GOAL #6   Title Pt. will improve right lateral and 3 pt. pinch strength by 3# to be able to grasp and hold utensils during self-feeding.   Baseline Pt. is unable to use her right hand to hold the utensils. #pt. pinch strength: 5#, Lateral pinch strength 4#   Time 12    Period Weeks   Status On-going   OT LONG TERM GOAL #7   Title pt. will improve Right grip strength by 5# to be able to open jars, and container lids.   Baseline Pt. is unable to open jars and lids with right hand. Right grip strength: 8#   Time 12   Period Weeks   Status On-going   OT LONG TERM GOAL #8   Title Pt. will  a 3 sentence paragragh efficiently with 75% legiblity.    Baseline Pt. is a ble to print name legibly with increased time.   Time 12   Period Weeks   Status On-going   OT LONG TERM GOAL  #9   Baseline Pt. will be indepndent with applying lotion over her left shoulder and scapula region with her right hand.   Time 12   Period Weeks   Status New               Plan - 10/04/15 1541    Clinical Impression Statement Pt. contnues to improve with overall strength and activity tolerance. Pt. continues to work on improving sustained grip strength while reaching into multiple planes, and angles. Pt. continues to work on improving right hand [facility] skills including: grasping, and moving objects within her hand in preparation for functional use. Pt. could benefit from skilled OT services to improve ADLs.     Rehab Potential Good   OT Frequency 2x / week   OT Duration 12 weeks   OT Treatment/Interventions Self-care/ADL training;Electrical Stimulation;Moist Heat;Manual Therapy;DME and/or AE instruction;Therapeutic activities;Passive range of motion;Ultrasound;Therapeutic exercises;Patient/family education;Neuromuscular education;Therapeutic exercise;Visual/perceptual remediation/compensation;Balance training   Consulted and Agree with Plan of Care Patient      Patient will benefit from skilled therapeutic intervention in order to improve the following deficits and impairments:  Decreased balance, Decreased strength, Decreased coordination, Decreased activity tolerance, Decreased range of motion, Impaired UE functional use, Decreased knowledge of use of DME, Decreased  endurance, Impaired tone  Visit Diagnosis: Facial weakness  Other lack of coordination    Problem List Patient Active Problem List   Diagnosis Date Noted  . 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 Atrial flutter, unspecified 09/18/2015  . Sjogren's disease ([place]) 09/18/2015  . Acute lower UTI   . Ischemic stroke of frontal lobe ([place]) 06/16/2015  . Dysphagia as late effect of cerebrovascular disease   . Hemiparesis  affecting dominant side as late effect of stroke ([place])   . Esophageal stricture   . Bacteremia   . Atrial flutter ([place])   . Rheumatoid arthritis ([place])   . Hyperglycemia   . Thyroid activity decreased   . Infection due to Streptococcus pyogenes 06/12/2015  . Severe sepsis with septic shock ([place]) 06/12/2015  . Hematuria 06/12/2015  . Dysphagia 06/12/2015  . 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 CVA (cerebral infarction)   . Acute respiratory failure ([place])   . Encounter for diagnostic procedure   . Ventilator dependence ([place])   . Carotid thromboses   . HLD (hyperlipidemia)   . Respiratory failure ([place])   . Stroke (cerebrum) ([place])   . Research study patient   . Endotracheal tube present   . Endotracheally intubated   . Encounter for feeding tube placement   . Altered mental state   . Weakness   . Sepsis ([place]) 05/30/2015  . CAP (community acquired pneumonia)   . Encephalopathy   . Encounter for central line placement   . Hypoxia   . Lactic acidosis    Harrel Carina, MS, OTR/L  Harrel Carina 10/04/2015, 3:59 PM  [place] MAIN [facility] SERVICES [address] [place], [place], [address] Phone: [phone]   Fax:  [phone]  Name: [name] [name] MRN: [id number] Date of Birth: 1956/02/11

## 2015-10-04 NOTE — Patient Instructions (Signed)
OT TREATMENT    Neuro muscular re-education:  Pt. Worked on grasping 1" resistive cubes with the thumb to the tip of the 2nd through 5th digits. Pt. Worked on placing them while isolating the 2nd digit. The board was placed at a vertical angle to encourage wrist extension.   Therapeutic Exercise:  Pt. Worked on the [employer] for 8 min. Total forward and reverse. Pt. Was able to keep the right hand on handle for the entire duration. Constant monitoring of the hand was provided 100% of the time. Pt. performed gross gripping with grip strengthener. Pt. worked on sustaining grip while grasping pegs and reaching at various heights.  Selfcare:  Pt. Worked on [employer]. Pt. Was able to print with the distal mobility needed in the wrist and fingers when stabilizing the forearm during printing, however had difficulty during cursive handwriting.

## 2015-10-04 NOTE — Progress Notes (Signed)
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 Please see full report Discussed with patient, Will trial wrist splints nightly for one month, left carpal tunnel injection in 1 month if not much better Patient is trying to avoid surgery

## 2015-10-06 ENCOUNTER — Ambulatory Visit: Payer: BC Managed Care – PPO | Admitting: Occupational Therapy

## 2015-10-06 DIAGNOSIS — R278 Other lack of coordination: Secondary | ICD-10-CM

## 2015-10-06 DIAGNOSIS — M6281 Muscle weakness (generalized): Secondary | ICD-10-CM

## 2015-10-06 NOTE — Therapy (Signed)
[place] MAIN [facility] SERVICES [address] [place], [place], [address] Phone: [phone]   Fax:  [phone]  Occupational Therapy Treatment  Patient Details  Name: [name] [name] MRN: [id number] Date of Birth: 08/09/1955 Referring Provider: Read Drivers  Encounter Date: 10/06/2015      OT End of Session - 10/06/15 1549    Visit Number 26   Number of Visits 30   Date for OT Re-Evaluation 10/03/15   Authorization Type 30   OT Start Time 1453   OT Stop Time 1538   OT Time Calculation (min) 45 min   Activity Tolerance Patient tolerated treatment well   Behavior During Therapy [facility] for tasks assessed/performed      Past Medical History  Diagnosis Date  . Hypothyroidism   . Seizures ([place])   . Adenopathy   . Scoliosis   . Spleen anomaly   . Anemia   . Sjogren's disease ([place])   . Raynaud disease   . Shingles   . Osteopenia   . Stroke [facility]) 20/2017    Past Surgical History  Procedure Laterality Date  . Tonsillectomy    . Breast cyst aspiration Right 1990's    neg  . Foot surgery Right   . Vocal cord surgery  1992    nodules    There were no vitals filed for this visit.      Subjective Assessment - 10/06/15 1517    Subjective  Pt. reports she will be coming once a week for the next few weeks.   Pertinent History [name] [name] is a 60 y.o.right handed female with history of Sjogrens disease/rheumatoid arthritis, noted history of seizure but not on antiepileptic meds and dysphagia. Patient diagnosed 4 days prior with an placed on Tamiflu. Lives with spouse independent prior to admission. 2 level home with bedroom on main level. 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 Presented 05/29/2015 with right side weakness and gait instability. Patient with severe restrictive airway distress when he presented to the ED with oxygen saturations in the 70s requiring nonrebreathing mask and later intubated with slow extubation  follow-up per critical care pulmonary services. Chest x-ray showed large right lung consolidative changes. CT angiogram chest no evidence of pulmonary emboli. Cranial CT scan negative. 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 Echocardiogram with ejection fraction of 65% no wall motion abnormalities. 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 EEG was negative for seizure. Patient did not receive TPA. Blood cultures showed group A strep pansensitive 05/30/2015 and currently maintained on Rocephin with droplet contact precautions   Currently in Pain? No/denies   Pain Score 0-No pain        OT TREATMENT    Therapeutic Ex:  Pt. Worked on improving strength with the SciFit for a total of 8 min. Forward/reverse. Pt. Required constant monitoring of the right hand d on the handle through the duration. Pt. Worked out o level 2.5 and 3.0. Pt. performed gross gripping with grip strengthener. Pt. worked on sustaining grip while grasping pegs and reaching at various heights. Pt. Worked in the 3rd resistive slot with the white resistive spring. Pt. Was upgraded, and provided with Green theraputty.   Neuro-muscular re-ed:  Pt. performed [facility] skills training to improve speed and dexterity needed for ADL tasks and writing. Pt. demonstrated grasping 1 inch sticks,  inch cylindrical collars, and  inch flat washers on the Purdue pegboard. Pt.  performed grasping each item with her 2nd digit and thumb, and storing them in the palm. Pt. presented with difficulty storing  inch objects at a time in the palmar aspect of the hand.                            OT Education - 10/06/15 1521    Education provided Yes   Person(s) Educated Patient   Methods Explanation;Demonstration   Comprehension Verbalized understanding             OT Long Term Goals - 09/17/15 1140    OT LONG TERM GOAL #1   Title Pt. will improve right shoulder  flexion strength by 2 mm grades to assist with reaching into cabinetry.   Baseline Pt. is unable   Time 12   Period Weeks   Status On-going   OT LONG TERM GOAL #2   Title Pt. will improve right grip fisting with digit flexion to [facility] improved by 2 cm in preparation for grasping/holding ADL grooming and feeding items.   Baseline limited digit fisting flexion to [facility].   Time 12   Status On-going   OT LONG TERM GOAL #3   Title Pt. will improve right elbow flexion and extension strength by 2 mm grades to be able to apply deoderant and wash left axilla region.   Baseline Pt. requires assist to apply deoderant and wash left axilla.   Time 12   Period Weeks   Status Achieved   OT LONG TERM GOAL #4   Title Pt. will improve right shoulder abduction by 10 degrees to assist with UE dressing.   Baseline Pt. requires assist   Time 12   Period Weeks   Status Achieved   OT LONG TERM GOAL #5   Title Pt. will be independent with light meal prep.   Baseline Pt. requires assist   Time 12   Period Weeks   Status On-going   Long Term Additional Goals   Additional Long Term Goals Yes   OT LONG TERM GOAL #6   Title Pt. will improve right lateral and 3 pt. pinch strength by 3# to be able to grasp and hold utensils during self-feeding.   Baseline Pt. is unable to use her right hand to hold the utensils. #pt. pinch strength: 5#, Lateral pinch strength 4#   Time 12   Period Weeks   Status On-going   OT LONG TERM GOAL #7   Title pt. will improve Right grip strength by 5# to be able to open jars, and container lids.   Baseline Pt. is unable to open jars and lids with right hand. Right grip strength: 8#   Time 12   Period Weeks   Status On-going   OT LONG TERM GOAL #8   Title Pt. will  a 3 sentence paragragh efficiently with 75% legiblity.    Baseline Pt. is a ble to print name legibly with increased time.   Time 12   Period Weeks   Status On-going   OT LONG TERM GOAL  #9   Baseline Pt. will be  indepndent with applying lotion over her left shoulder and scapula region with her right hand.   Time 12   Period Weeks   Status New               Plan - 10/06/15 1711    Clinical Impression Statement Pt. continues to work on improving UE strength and  [facility] skills. Pt. continues to benefit from skilled OT services for improving grip strength, translatory movements, as well as moving objects through her hand in preparation for functional use during ADLs. Pt. was upgraded to  green theraputty.   Rehab Potential Good   OT Frequency 2x / week   OT Duration 12 weeks   OT Treatment/Interventions Self-care/ADL training;Electrical Stimulation;Moist Heat;Manual Therapy;DME and/or AE instruction;Therapeutic activities;Passive range of motion;Ultrasound;Therapeutic exercises;Patient/family education;Neuromuscular education;Therapeutic exercise;Visual/perceptual remediation/compensation;Balance training   Consulted and Agree with Plan of Care Patient      Patient will benefit from skilled therapeutic intervention in order to improve the following deficits and impairments:  Decreased balance, Decreased strength, Decreased coordination, Decreased activity tolerance, Decreased range of motion, Impaired UE functional use, Decreased knowledge of use of DME, Decreased endurance, Impaired tone  Visit Diagnosis: Muscle weakness (generalized)  Other lack of coordination    Problem List Patient Active Problem List   Diagnosis Date Noted  . 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 Atrial flutter, unspecified 09/18/2015  . Sjogren's disease ([place]) 09/18/2015  . Acute lower UTI   . Ischemic stroke of frontal lobe ([place]) 06/16/2015  . Dysphagia as late effect of cerebrovascular disease   . Hemiparesis affecting dominant side as late effect of stroke ([place])   . Esophageal stricture   . Bacteremia   . Atrial flutter ([place])   . Rheumatoid arthritis ([place])   . Hyperglycemia   . Thyroid activity decreased   .  Infection due to Streptococcus pyogenes 06/12/2015  . Severe sepsis with septic shock ([place]) 06/12/2015  . Hematuria 06/12/2015  . Dysphagia 06/12/2015  . 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 CVA (cerebral infarction)   . Acute respiratory failure ([place])   . Encounter for diagnostic procedure   . Ventilator dependence ([place])   . Carotid thromboses   . HLD (hyperlipidemia)   . Respiratory failure ([place])   . Stroke (cerebrum) ([place])   . Research study patient   . Endotracheal tube present   . Endotracheally intubated   . Encounter for feeding tube placement   . Altered mental state   . Weakness   . Sepsis ([place]) 05/30/2015  . CAP (community acquired pneumonia)   . Encephalopathy   . Encounter for central line placement   . Hypoxia   . Lactic acidosis     Harrel Carina 10/06/2015, 5:13 PM  [place] MAIN [facility] SERVICES [address] [place], [place], [address] Phone: [phone]   Fax:  [phone]  Name: [name] [name] MRN: [id number] Date of Birth: 05-10-1955

## 2015-10-06 NOTE — Patient Instructions (Addendum)
OT TREATMENT    Therapeutic Ex:  Pt. Worked on improving strength with the SciFit for a total of 8 min. Forward/reverse. Pt. Required constant monitoring of the right hand d on the handle through the duration. Pt. Worked out o level 2.5 and 3.0. Pt. performed gross gripping with grip strengthener. Pt. worked on sustaining grip while grasping pegs and reaching at various heights. Pt. Worked in the 3rd resistive slot with the white resistive spring. Pt. Was upgraded, and provided with Green theraputty.   Neuro-muscular re-ed:  Pt. performed [facility] skills training to improve speed and dexterity needed for ADL tasks and writing. Pt. demonstrated grasping 1 inch sticks,  inch cylindrical collars, and  inch flat washers on the Purdue pegboard. Pt. performed grasping each item with her 2nd digit and thumb, and storing them in the palm. Pt. presented with difficulty storing  inch objects at a time in the palmar aspect of the hand.

## 2015-10-11 ENCOUNTER — Encounter: Payer: BC Managed Care – PPO | Admitting: Occupational Therapy

## 2015-10-12 ENCOUNTER — Other Ambulatory Visit: Payer: Self-pay | Admitting: Physical Medicine & Rehabilitation

## 2015-10-12 NOTE — Telephone Encounter (Signed)
Is this refill appropriate? Does she still have feeding tube?

## 2015-10-13 ENCOUNTER — Ambulatory Visit: Payer: BC Managed Care – PPO | Admitting: Occupational Therapy

## 2015-10-13 DIAGNOSIS — R278 Other lack of coordination: Secondary | ICD-10-CM

## 2015-10-13 DIAGNOSIS — M6281 Muscle weakness (generalized): Secondary | ICD-10-CM

## 2015-10-13 NOTE — Therapy (Signed)
[place] MAIN [facility] SERVICES [address] [place], [place], [address] Phone: [phone]   Fax:  [phone]  Occupational Therapy Treatment  Patient Details  Name: [name] [name] MRN: [id number] Date of Birth: 10-19-1955 Referring Provider: Read Drivers  Encounter Date: 10/13/2015      OT End of Session - 10/13/15 1510    Visit Number 27   Number of Visits 30   Date for OT Re-Evaluation 10/18/15   Authorization Type 30   OT Start Time 1437   OT Stop Time 1500   OT Time Calculation (min) 23 min   Activity Tolerance Patient tolerated treatment well   Behavior During Therapy [facility] for tasks assessed/performed      Past Medical History  Diagnosis Date  . Hypothyroidism   . Seizures ([place])   . Adenopathy   . Scoliosis   . Spleen anomaly   . Anemia   . Sjogren's disease ([place])   . Raynaud disease   . Shingles   . Osteopenia   . Stroke [facility]) 20/2017    Past Surgical History  Procedure Laterality Date  . Tonsillectomy    . Breast cyst aspiration Right 1990's    neg  . Foot surgery Right   . Vocal cord surgery  1992    nodules    There were no vitals filed for this visit.      Subjective Assessment - 10/13/15 1507    Subjective  Pt. reports she thought her appointment was at 2:45 instead of 2:15.   Patient is accompained by: Family member   Pertinent History [name] [name] is a 60 y.o.right handed female with history of Sjogrens disease/rheumatoid arthritis, noted history of seizure but not on antiepileptic meds and dysphagia. Patient diagnosed 62 days prior with an placed on Tamiflu. Lives with spouse independent prior to admission. 2 level home with bedroom on main level. 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 Presented 05/29/2015 with right side weakness and gait instability. Patient with severe restrictive airway distress when he presented to the ED with oxygen saturations in the 70s requiring nonrebreathing  mask and later intubated with slow extubation follow-up per critical care pulmonary services. Chest x-ray showed large right lung consolidative changes. CT angiogram chest no evidence of pulmonary emboli. Cranial CT scan negative. 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 Echocardiogram with ejection fraction of 65% no wall motion abnormalities. 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 EEG was negative for seizure. Patient did not receive TPA. Blood cultures showed group A strep pansensitive 05/30/2015 and currently maintained on Rocephin with droplet contact precautions   Patient Stated Goals To regain use of her right hand.   Currently in Pain? No/denies      OT TREATMENT    Neuro muscular re-education:  Pt. Worked on grasping, flipping, and stacking pegs with her right hand. Pt. Performed the task in standing. Pt. worked on translatory movements of the right hand, and moving objects within her hand using various sized circular spheres. Pt. had difficulty changing directions of movement.  Therapeutic Exercise:  Pt. performed gross gripping with grip strengthener. Pt. worked on sustaining grip while grasping pegs and reaching at various heights. Performed in standing with more difficulty maintaining grip in standing. Pt. Worked with grip in 3rd resistive slot, however it was graded down to the 2nd slot.  OT Education - 10/13/15 1718    Education provided Yes   Education Details Grip strength and coordination tasks in standing.   Person(s) Educated Patient   Methods Explanation;Demonstration   Comprehension Verbalized understanding             OT Long Term Goals - 09/17/15 1140    OT LONG TERM GOAL #1   Title Pt. will improve right shoulder flexion strength by 2 mm grades to assist with reaching into cabinetry.   Baseline Pt. is unable   Time 12   Period Weeks   Status  On-going   OT LONG TERM GOAL #2   Title Pt. will improve right grip fisting with digit flexion to [facility] improved by 2 cm in preparation for grasping/holding ADL grooming and feeding items.   Baseline limited digit fisting flexion to [facility].   Time 12   Status On-going   OT LONG TERM GOAL #3   Title Pt. will improve right elbow flexion and extension strength by 2 mm grades to be able to apply deoderant and wash left axilla region.   Baseline Pt. requires assist to apply deoderant and wash left axilla.   Time 12   Period Weeks   Status Achieved   OT LONG TERM GOAL #4   Title Pt. will improve right shoulder abduction by 10 degrees to assist with UE dressing.   Baseline Pt. requires assist   Time 12   Period Weeks   Status Achieved   OT LONG TERM GOAL #5   Title Pt. will be independent with light meal prep.   Baseline Pt. requires assist   Time 12   Period Weeks   Status On-going   Long Term Additional Goals   Additional Long Term Goals Yes   OT LONG TERM GOAL #6   Title Pt. will improve right lateral and 3 pt. pinch strength by 3# to be able to grasp and hold utensils during self-feeding.   Baseline Pt. is unable to use her right hand to hold the utensils. #pt. pinch strength: 5#, Lateral pinch strength 4#   Time 12   Period Weeks   Status On-going   OT LONG TERM GOAL #7   Title pt. will improve Right grip strength by 5# to be able to open jars, and container lids.   Baseline Pt. is unable to open jars and lids with right hand. Right grip strength: 8#   Time 12   Period Weeks   Status On-going   OT LONG TERM GOAL #8   Title Pt. will  a 3 sentence paragragh efficiently with 75% legiblity.    Baseline Pt. is a ble to print name legibly with increased time.   Time 12   Period Weeks   Status On-going   OT LONG TERM GOAL  #9   Baseline Pt. will be indepndent with applying lotion over her left shoulder and scapula region with her right hand.   Time 12   Period Weeks   Status New                Plan - 10/13/15 1513    Clinical Impression Statement Pt. is making progress with her RUE and hand coordination. Pt. does present with decreased grip strength when working on grip strengthening in standing. Pt. is able to complete translatory movements in one direction, however has diifficulty changing the direction..   Rehab Potential Good   OT Frequency 2x / week   OT Duration 12 weeks  OT Treatment/Interventions Self-care/ADL training;Electrical Stimulation;Moist Heat;Manual Therapy;DME and/or AE instruction;Therapeutic activities;Passive range of motion;Ultrasound;Therapeutic exercises;Patient/family education;Neuromuscular education;Therapeutic exercise;Visual/perceptual remediation/compensation;Balance training      Patient will benefit from skilled therapeutic intervention in order to improve the following deficits and impairments:  Decreased balance, Decreased strength, Decreased coordination, Decreased activity tolerance, Decreased range of motion, Impaired UE functional use, Decreased knowledge of use of DME, Decreased endurance, Impaired tone  Visit Diagnosis: Muscle weakness (generalized)  Other lack of coordination    Problem List Patient Active Problem List   Diagnosis Date Noted  . 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 Atrial flutter, unspecified 09/18/2015  . Sjogren's disease ([place]) 09/18/2015  . Acute lower UTI   . Ischemic stroke of frontal lobe ([place]) 06/16/2015  . Dysphagia as late effect of cerebrovascular disease   . Hemiparesis affecting dominant side as late effect of stroke ([place])   . Esophageal stricture   . Bacteremia   . Atrial flutter ([place])   . Rheumatoid arthritis ([place])   . Hyperglycemia   . Thyroid activity decreased   . Infection due to Streptococcus pyogenes 06/12/2015  . Severe sepsis with septic shock ([place]) 06/12/2015  . Hematuria 06/12/2015  . Dysphagia 06/12/2015  . 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 CVA (cerebral  infarction)   . Acute respiratory failure ([place])   . Encounter for diagnostic procedure   . Ventilator dependence ([place])   . Carotid thromboses   . HLD (hyperlipidemia)   . Respiratory failure ([place])   . Stroke (cerebrum) ([place])   . Research study patient   . Endotracheal tube present   . Endotracheally intubated   . Encounter for feeding tube placement   . Altered mental state   . Weakness   . Sepsis ([place]) 05/30/2015  . CAP (community acquired pneumonia)   . Encephalopathy   . Encounter for central line placement   . Hypoxia   . Lactic acidosis    Harrel Carina, MS, OTR/L  Harrel Carina 10/13/2015, 5:27 PM  [place] MAIN [facility] SERVICES [address] [place], [place], [address] Phone: [phone]   Fax:  [phone]  Name: [name] [name] MRN: [id number] Date of Birth: 07/11/1955

## 2015-10-13 NOTE — Patient Instructions (Signed)
OT TREATMENT    Neuro muscular re-education:  Pt. Worked on grasping, flipping, and stacking pegs with her right hand. Pt. Performed the task in standing. Pt. worked on translatory movements of the right hand, and moving objects within her hand using various sized circular spheres. Pt. had difficulty changing directions of movement.  Therapeutic Exercise:  Pt. performed gross gripping with grip strengthener. Pt. worked on sustaining grip while grasping pegs and reaching at various heights. Performed in standing with more difficulty maintaining grip in standing.

## 2015-10-18 ENCOUNTER — Encounter: Payer: BC Managed Care – PPO | Admitting: Occupational Therapy

## 2015-10-18 ENCOUNTER — Ambulatory Visit: Payer: BC Managed Care – PPO | Attending: Family Medicine | Admitting: Occupational Therapy

## 2015-10-18 DIAGNOSIS — R278 Other lack of coordination: Secondary | ICD-10-CM | POA: Insufficient documentation

## 2015-10-18 DIAGNOSIS — M6281 Muscle weakness (generalized): Secondary | ICD-10-CM | POA: Diagnosis present

## 2015-10-18 DIAGNOSIS — R29810 Facial weakness: Secondary | ICD-10-CM | POA: Insufficient documentation

## 2015-10-18 NOTE — Patient Instructions (Signed)
OT TREATMENT    Neuro muscular re-education:  Pt. worked on improving coordination skills with translatory movements, and inner hand movements. Pt. Worked on grasping 1/4" by 1/2" pegs, and storing them in her hand, as well and working them out to the tip of her digits to place them at the tabletop. Pt. Worked on thumb opposition with the 2nd through 5th digits. Pt. Performed this sitting and standing.  Therapeutic Exercise:  Pt. Worked on the [employer] for 8 min. Forward and reverse. Pt. Required constant monitoring of her right hand on the handle throughout the duration of the task. Pt. performed gross gripping with grip strengthener. Pt. worked on sustaining grip while grasping pegs and reaching at various heights. Performed in sitting and standing with more difficulty maintaining grip in standing.

## 2015-10-18 NOTE — Therapy (Signed)
[place] MAIN [facility] SERVICES [address] [place], [place], [address] Phone: [phone]   Fax:  [phone]  Occupational Therapy Treatment  Patient Details  Name: [name] [name] MRN: [id number] Date of Birth: 02-22-1956 Referring Provider: Read Drivers  Encounter Date: 10/18/2015      OT End of Session - 10/18/15 1424    Visit Number 28   Number of Visits 30   Date for OT Re-Evaluation 10/18/15   Authorization Type 30   OT Start Time 1347   OT Stop Time 1430   OT Time Calculation (min) 43 min   Activity Tolerance Patient tolerated treatment well   Behavior During Therapy [facility] for tasks assessed/performed      Past Medical History  Diagnosis Date  . Hypothyroidism   . Seizures ([place])   . Adenopathy   . Scoliosis   . Spleen anomaly   . Anemia   . Sjogren's disease ([place])   . Raynaud disease   . Shingles   . Osteopenia   . Stroke [facility]) 20/2017    Past Surgical History  Procedure Laterality Date  . Tonsillectomy    . Breast cyst aspiration Right 1990's    neg  . Foot surgery Right   . Vocal cord surgery  1992    nodules    There were no vitals filed for this visit.      Subjective Assessment - 10/18/15 1621    Subjective  Pt. reports she feels like her hand is improving.   Patient is accompained by: Family member   Pertinent History [name] [name] is a 60 y.o.right handed female with history of Sjogrens disease/rheumatoid arthritis, noted history of seizure but not on antiepileptic meds and dysphagia. Patient diagnosed 4 days prior with an placed on Tamiflu. Lives with spouse independent prior to admission. 2 level home with bedroom on main level. 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 Presented 05/29/2015 with right side weakness and gait instability. Patient with severe restrictive airway distress when he presented to the ED with oxygen saturations in the 70s requiring nonrebreathing mask and later  intubated with slow extubation follow-up per critical care pulmonary services. Chest x-ray showed large right lung consolidative changes. CT angiogram chest no evidence of pulmonary emboli. Cranial CT scan negative. 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 Echocardiogram with ejection fraction of 65% no wall motion abnormalities. 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 EEG was negative for seizure. Patient did not receive TPA. Blood cultures showed group A strep pansensitive 05/30/2015 and currently maintained on Rocephin with droplet contact precautions   Patient Stated Goals To regain use of her right hand.   Currently in Pain? No/denies          OT TREATMENT    Neuro muscular re-education:  Pt. worked on improving coordination skills with translatory movements, and inner hand movements. Pt. Worked on grasping 1/4" by 1/2" pegs, and storing them in her hand, as well and working them out to the tip of her digits to place them at the tabletop. Pt. Worked on thumb opposition with the 2nd through 5th digits. Pt. Performed this sitting and standing.  Therapeutic Exercise:  Pt. Worked on the [employer] for 8 min. Forward and reverse. Pt. Required constant monitoring of her right hand on the handle throughout the duration of the task. Pt. performed gross gripping with grip strengthener. Pt. worked on sustaining grip while  grasping pegs and reaching at various heights. Performed in sitting and standing with more difficulty maintaining grip in standing.                          OT Education - 10/18/15 1624    Education provided Yes   Education Details Grip strength and coordination.   Person(s) Educated Patient   Methods Explanation;Demonstration   Comprehension Verbalized understanding             OT Long Term Goals - 09/17/15 1140    OT LONG TERM GOAL #1   Title Pt. will improve right shoulder flexion  strength by 2 mm grades to assist with reaching into cabinetry.   Baseline Pt. is unable   Time 12   Period Weeks   Status On-going   OT LONG TERM GOAL #2   Title Pt. will improve right grip fisting with digit flexion to [facility] improved by 2 cm in preparation for grasping/holding ADL grooming and feeding items.   Baseline limited digit fisting flexion to [facility].   Time 12   Status On-going   OT LONG TERM GOAL #3   Title Pt. will improve right elbow flexion and extension strength by 2 mm grades to be able to apply deoderant and wash left axilla region.   Baseline Pt. requires assist to apply deoderant and wash left axilla.   Time 12   Period Weeks   Status Achieved   OT LONG TERM GOAL #4   Title Pt. will improve right shoulder abduction by 10 degrees to assist with UE dressing.   Baseline Pt. requires assist   Time 12   Period Weeks   Status Achieved   OT LONG TERM GOAL #5   Title Pt. will be independent with light meal prep.   Baseline Pt. requires assist   Time 12   Period Weeks   Status On-going   Long Term Additional Goals   Additional Long Term Goals Yes   OT LONG TERM GOAL #6   Title Pt. will improve right lateral and 3 pt. pinch strength by 3# to be able to grasp and hold utensils during self-feeding.   Baseline Pt. is unable to use her right hand to hold the utensils. #pt. pinch strength: 5#, Lateral pinch strength 4#   Time 12   Period Weeks   Status On-going   OT LONG TERM GOAL #7   Title pt. will improve Right grip strength by 5# to be able to open jars, and container lids.   Baseline Pt. is unable to open jars and lids with right hand. Right grip strength: 8#   Time 12   Period Weeks   Status On-going   OT LONG TERM GOAL #8   Title Pt. will  a 3 sentence paragragh efficiently with 75% legiblity.    Baseline Pt. is a ble to print name legibly with increased time.   Time 12   Period Weeks   Status On-going   OT LONG TERM GOAL  #9   Baseline Pt. will be indepndent  with applying lotion over her left shoulder and scapula region with her right hand.   Time 12   Period Weeks   Status New               Plan - 10/18/15 1625    Clinical Impression Statement Pt. continues to progress with her RUE strength and hand function skills. Pt. continues to require work on  improving [facility] skills, tranlatory movements, and the development of inner hand movements. Pt. worked on improving strength and [place] in standing.    Rehab Potential Good   OT Frequency 2x / week   OT Duration 12 weeks   OT Treatment/Interventions Self-care/ADL training;Electrical Stimulation;Moist Heat;Manual Therapy;DME and/or AE instruction;Therapeutic activities;Passive range of motion;Ultrasound;Therapeutic exercises;Patient/family education;Neuromuscular education;Therapeutic exercise;Visual/perceptual remediation/compensation;Balance training   Consulted and Agree with Plan of Care Patient      Patient will benefit from skilled therapeutic intervention in order to improve the following deficits and impairments:  Decreased balance, Decreased strength, Decreased coordination, Decreased activity tolerance, Decreased range of motion, Impaired UE functional use, Decreased knowledge of use of DME, Decreased endurance, Impaired tone  Visit Diagnosis: Facial weakness  Other lack of coordination    Problem List Patient Active Problem List   Diagnosis Date Noted  . 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 Atrial flutter, unspecified 09/18/2015  . Sjogren's disease ([place]) 09/18/2015  . Acute lower UTI   . Ischemic stroke of frontal lobe ([place]) 06/16/2015  . Dysphagia as late effect of cerebrovascular disease   . Hemiparesis affecting dominant side as late effect of stroke ([place])   . Esophageal stricture   . Bacteremia   . Atrial flutter ([place])   . Rheumatoid arthritis ([place])   . Hyperglycemia   . Thyroid activity decreased   . Infection due to Streptococcus pyogenes 06/12/2015  . Severe  sepsis with septic shock ([place]) 06/12/2015  . Hematuria 06/12/2015  . Dysphagia 06/12/2015  . 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 CVA (cerebral infarction)   . Acute respiratory failure ([place])   . Encounter for diagnostic procedure   . Ventilator dependence ([place])   . Carotid thromboses   . HLD (hyperlipidemia)   . Respiratory failure ([place])   . Stroke (cerebrum) ([place])   . Research study patient   . Endotracheal tube present   . Endotracheally intubated   . Encounter for feeding tube placement   . Altered mental state   . Weakness   . Sepsis ([place]) 05/30/2015  . CAP (community acquired pneumonia)   . Encephalopathy   . Encounter for central line placement   . Hypoxia   . Lactic acidosis    Harrel Carina, MS, OTR/L  Harrel Carina 10/18/2015, 5:26 PM  [place] MAIN [name] SERVICES [address] [place], [place], [address] Phone: [phone]   Fax:  [phone]  Name: [name] [name] MRN: [id number] Date of Birth: 05/14/1955

## 2015-10-20 ENCOUNTER — Encounter: Payer: BC Managed Care – PPO | Admitting: Occupational Therapy

## 2015-11-01 ENCOUNTER — Ambulatory Visit (HOSPITAL_BASED_OUTPATIENT_CLINIC_OR_DEPARTMENT_OTHER): Payer: BC Managed Care – PPO | Admitting: Physical Medicine & Rehabilitation

## 2015-11-01 ENCOUNTER — Encounter: Payer: Self-pay | Admitting: Physical Medicine & Rehabilitation

## 2015-11-01 ENCOUNTER — Encounter: Payer: BC Managed Care – PPO | Attending: Physical Medicine & Rehabilitation

## 2015-11-01 VITALS — BP 109/72 | HR 75 | Resp 16

## 2015-11-01 DIAGNOSIS — Z9889 Other specified postprocedural states: Secondary | ICD-10-CM | POA: Insufficient documentation

## 2015-11-01 DIAGNOSIS — I638 Other cerebral infarction: Secondary | ICD-10-CM | POA: Insufficient documentation

## 2015-11-01 DIAGNOSIS — K222 Esophageal obstruction: Secondary | ICD-10-CM | POA: Insufficient documentation

## 2015-11-01 DIAGNOSIS — M8580 Other specified disorders of bone density and structure, unspecified site: Secondary | ICD-10-CM | POA: Diagnosis not present

## 2015-11-01 DIAGNOSIS — B998 Other infectious disease: Secondary | ICD-10-CM | POA: Insufficient documentation

## 2015-11-01 DIAGNOSIS — E785 Hyperlipidemia, unspecified: Secondary | ICD-10-CM | POA: Diagnosis not present

## 2015-11-01 DIAGNOSIS — R569 Unspecified convulsions: Secondary | ICD-10-CM | POA: Diagnosis not present

## 2015-11-01 DIAGNOSIS — R531 Weakness: Secondary | ICD-10-CM | POA: Diagnosis present

## 2015-11-01 DIAGNOSIS — I69359 Hemiplegia and hemiparesis following cerebral infarction affecting unspecified side: Secondary | ICD-10-CM | POA: Diagnosis not present

## 2015-11-01 DIAGNOSIS — I7300 Raynaud's syndrome without gangrene: Secondary | ICD-10-CM | POA: Diagnosis not present

## 2015-11-01 DIAGNOSIS — R1310 Dysphagia, unspecified: Secondary | ICD-10-CM | POA: Insufficient documentation

## 2015-11-01 DIAGNOSIS — I4892 Unspecified atrial flutter: Secondary | ICD-10-CM | POA: Insufficient documentation

## 2015-11-01 DIAGNOSIS — E039 Hypothyroidism, unspecified: Secondary | ICD-10-CM | POA: Diagnosis not present

## 2015-11-01 DIAGNOSIS — M069 Rheumatoid arthritis, unspecified: Secondary | ICD-10-CM | POA: Insufficient documentation

## 2015-11-01 DIAGNOSIS — M3500 Sicca syndrome, unspecified: Secondary | ICD-10-CM | POA: Diagnosis not present

## 2015-11-01 NOTE — Patient Instructions (Signed)
Please call if you need a injection for the carpal tunnel syndrome.

## 2015-11-01 NOTE — Progress Notes (Signed)
60 year old right-handed female with history of Sjogren disease with rheumatoid arthritis.  Recently diagnosed prior with flu, placed on Tamiflu.  She was independent prior to admission, living with her husband.  Presented on May 29, 2015, right-sided weakness and gait instability.  The patient with severe restrictive airway distress when presented to the ED with oxygen saturations in the 70s, requiring non-rebreather mask, later intubated, slow extubation, followup per Critical Care Pulmonary Services.  Chest x- ray showed large right consolidative changes.  CT angiogram of the chest, no evidence of pulmonary emboli.  Cranial CT scan negative.  MRI of the brain showed a small left frontal lobe infarct.  Echocardiogram with ejection fraction of 65%.  No wall motion abnormalities.  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  EEG negative for seizure.  The patient did not receive tPA. Blood culture showed group A strep pansensitive, May 30, 2015, placed on Rocephin.  Hospital course, atrial flutter.  Placed on Lovenox.  The patient asymptomatic.  Low-dose beta-blocker added. Modified barium swallow, June 09, 2015, showed severe pharyngeal dysphagia as well as esophageal stricture.  Underwent gastrostomy tube placement, Interventional Radiology, June 14, 2015.  Plavix and aspirin were resumed after PEG tube placed for CVA prophylaxis.  Patient has completed inpatient rehabilitation, has 2 more visits left. With OT for outpatient rehabilitation.  Was diagnosed with left carpal tunnel syndrome after EMG demonstrated slowing of both median motor and sensory approximate one month ago. She has been wearing a splint since that time at night. She's had resolution of symptoms now.  Examination Gen. no acute distress. Mood and affect are appropriate. Motor strength is 3+ right deltoid, bicep, tricep, finger flexors and extensors. 5/5  bilateral hip flexor, knee extensor, ankle dorsiflexor 5/5 left deltoid, biceps, triceps, grip  Impression 1.  Left carpal tunnel syndrome resolved Call if recurrence and wrist splints fail to help  2.  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

## 2015-11-02 ENCOUNTER — Encounter: Payer: BC Managed Care – PPO | Admitting: Occupational Therapy

## 2015-11-03 ENCOUNTER — Ambulatory Visit: Payer: BC Managed Care – PPO | Admitting: Occupational Therapy

## 2015-11-03 DIAGNOSIS — R278 Other lack of coordination: Secondary | ICD-10-CM

## 2015-11-03 DIAGNOSIS — M6281 Muscle weakness (generalized): Secondary | ICD-10-CM

## 2015-11-03 DIAGNOSIS — R29810 Facial weakness: Secondary | ICD-10-CM | POA: Diagnosis not present

## 2015-11-03 NOTE — Therapy (Signed)
[place] MAIN [facility] [place] [facility] SERVICES [address] [place], [place], [address] Phone: [phone]   Fax:  [phone]  Occupational Therapy Treatment  Patient Details  Name: [name] [name] MRN: [id number] Date of Birth: Jul 01, 1955 Referring Provider: Read Drivers  Encounter Date: 11/03/2015      OT End of Session - 11/03/15 1832    Visit Number 29   Number of Visits 30   Date for OT Re-Evaluation 10/18/15   Authorization Type 30   OT Start Time 1435   OT Stop Time 1515   OT Time Calculation (min) 40 min      Past Medical History  Diagnosis Date  . Hypothyroidism   . Seizures ([place])   . Adenopathy   . Scoliosis   . Spleen anomaly   . Anemia   . Sjogren's disease ([place])   . Raynaud disease   . Shingles   . Osteopenia   . Stroke [facility]) 20/2017    Past Surgical History  Procedure Laterality Date  . Tonsillectomy    . Breast cyst aspiration Right 1990's    neg  . Foot surgery Right   . Vocal cord surgery  1992    nodules    There were no vitals filed for this visit.      Subjective Assessment - 11/03/15 1831    Subjective  Pt. is preparing for her last treatment session next week.   Patient is accompained by: Family member   Pertinent History [name] [name] is a 60 y.o.right handed female with history of Sjogrens disease/rheumatoid arthritis, noted history of seizure but not on antiepileptic meds and dysphagia. Patient diagnosed 4 days prior with an placed on Tamiflu. Lives with spouse independent prior to admission. 2 level home with bedroom on main level. 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 Presented 05/29/2015 with right side weakness and gait instability. Patient with severe restrictive airway distress when he presented to the ED with oxygen saturations in the 70s requiring nonrebreathing mask and later intubated with slow extubation follow-up per critical care pulmonary services. Chest x-ray showed large right  lung consolidative changes. CT angiogram chest no evidence of pulmonary emboli. Cranial CT scan negative. 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 Echocardiogram with ejection fraction of 65% no wall motion abnormalities. 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 EEG was negative for seizure. Patient did not receive TPA. Blood cultures showed group A strep pansensitive 05/30/2015 and currently maintained on Rocephin with droplet contact precautions   Patient Stated Goals To regain use of her right hand.      OT TREATMENT    Self-care:  Pt. Worked on typing speed and accuracy. Pt. Completed a 28 word sentence in 2 min and 16 sec. Pt. Required cues for hand placement. Pt. Worked on writing speed and legibility. Pt. Was able to complete printed 28 word sentence in 3 min and 36 sec.  Therapeutic Exercise:  Pt. Worked on the [employer] for UE strengthening for 8 min. With constant monittoring of the right hand on the handle throughout the duration of the SciFit. Pt. performed gross gripping with grip strengthener. Pt. worked on sustaining grip while grasping pegs and reaching at various heights. Pt. Worked with gripper in the 3rd resistive slot.                           OT Education -  11/03/15 1831    Education provided Yes   Person(s) Educated Patient   Methods Explanation;Demonstration   Comprehension Verbalized understanding             OT Long Term Goals - 09/17/15 1140    OT LONG TERM GOAL #1   Title Pt. will improve right shoulder flexion strength by 2 mm grades to assist with reaching into cabinetry.   Baseline Pt. is unable   Time 12   Period Weeks   Status On-going   OT LONG TERM GOAL #2   Title Pt. will improve right grip fisting with digit flexion to [facility] improved by 2 cm in preparation for grasping/holding ADL grooming and feeding items.   Baseline limited digit fisting flexion to [facility].    Time 12   Status On-going   OT LONG TERM GOAL #3   Title Pt. will improve right elbow flexion and extension strength by 2 mm grades to be able to apply deoderant and wash left axilla region.   Baseline Pt. requires assist to apply deoderant and wash left axilla.   Time 12   Period Weeks   Status Achieved   OT LONG TERM GOAL #4   Title Pt. will improve right shoulder abduction by 10 degrees to assist with UE dressing.   Baseline Pt. requires assist   Time 12   Period Weeks   Status Achieved   OT LONG TERM GOAL #5   Title Pt. will be independent with light meal prep.   Baseline Pt. requires assist   Time 12   Period Weeks   Status On-going   Long Term Additional Goals   Additional Long Term Goals Yes   OT LONG TERM GOAL #6   Title Pt. will improve right lateral and 3 pt. pinch strength by 3# to be able to grasp and hold utensils during self-feeding.   Baseline Pt. is unable to use her right hand to hold the utensils. #pt. pinch strength: 5#, Lateral pinch strength 4#   Time 12   Period Weeks   Status On-going   OT LONG TERM GOAL #7   Title pt. will improve Right grip strength by 5# to be able to open jars, and container lids.   Baseline Pt. is unable to open jars and lids with right hand. Right grip strength: 8#   Time 12   Period Weeks   Status On-going   OT LONG TERM GOAL #8   Title Pt. will  a 3 sentence paragragh efficiently with 75% legiblity.    Baseline Pt. is a ble to print name legibly with increased time.   Time 12   Period Weeks   Status On-going   OT LONG TERM GOAL  #9   Baseline Pt. will be indepndent with applying lotion over her left shoulder and scapula region with her right hand.   Time 12   Period Weeks   Status New               Plan - 11/03/15 1832    Clinical Impression Statement Pt. continues to progress with her RUE hand strength and coordination skills. Pt. continues to work on pen grasp, and hand movements neeeded for writing legibillity,  and speed, as well as typing speed.   Rehab Potential Good   OT Frequency 2x / week   OT Duration 12 weeks   OT Treatment/Interventions Self-care/ADL training;Electrical Stimulation;Moist Heat;Manual Therapy;DME and/or AE instruction;Therapeutic activities;Passive range of motion;Ultrasound;Therapeutic exercises;Patient/family education;Neuromuscular education;Therapeutic exercise;Visual/perceptual remediation/compensation;Balance training  Consulted and Agree with Plan of Care Patient      Patient will benefit from skilled therapeutic intervention in order to improve the following deficits and impairments:  Decreased balance, Decreased strength, Decreased coordination, Decreased activity tolerance, Decreased range of motion, Impaired UE functional use, Decreased knowledge of use of DME, Decreased endurance, Impaired tone  Visit Diagnosis: Other lack of coordination  Muscle weakness (generalized)    Problem List Patient Active Problem List   Diagnosis Date Noted  . 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 Atrial flutter, unspecified 09/18/2015  . Sjogren's disease ([place]) 09/18/2015  . Acute lower UTI   . Ischemic stroke of frontal lobe ([place]) 06/16/2015  . Dysphagia as late effect of cerebrovascular disease   . Hemiparesis affecting dominant side as late effect of stroke ([place])   . Esophageal stricture   . Bacteremia   . Atrial flutter ([place])   . Rheumatoid arthritis ([place])   . Hyperglycemia   . Thyroid activity decreased   . Infection due to Streptococcus pyogenes 06/12/2015  . Severe sepsis with septic shock ([place]) 06/12/2015  . Hematuria 06/12/2015  . Dysphagia 06/12/2015  . 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 CVA (cerebral infarction)   . Acute respiratory failure ([place])   . Encounter for diagnostic procedure   . Ventilator dependence ([place])   . Carotid thromboses   . HLD (hyperlipidemia)   . Respiratory failure ([place])   . Stroke (cerebrum) ([place])   . Research study patient    . Endotracheal tube present   . Endotracheally intubated   . Encounter for feeding tube placement   . Altered mental state   . Weakness   . Sepsis ([place]) 05/30/2015  . CAP (community acquired pneumonia)   . Encephalopathy   . Encounter for central line placement   . Hypoxia   . Lactic acidosis    Harrel Carina, MS, OTR/L  Harrel Carina 11/03/2015, 6:43 PM  [place] MAIN [facility] SERVICES [address] [place], [place], [address] Phone: [phone]   Fax:  [phone]  Name: [name] [name] MRN: [id number] Date of Birth: 04-04-1956

## 2015-11-03 NOTE — Patient Instructions (Signed)
OT TREATMENT    Self-care:  Pt. Worked on typing speed and accuracy. Pt. Completed a 28 word sentence in 2 min and 16 sec. Pt. Required cues for hand placement. Pt. Worked on writing speed and legibility. Pt. Was able to complete printed 28 word sentence in 3 min and 36 sec.  Therapeutic Exercise:  Pt. Worked on the [employer] for UE strengthening for 8 min. With constant monittoring of the right hand on the handle throughout the duration of the SciFit. Pt. performed gross gripping with grip strengthener. Pt. worked on sustaining grip while grasping pegs and reaching at various heights. Pt. Worked with gripper in the 3rd resistive slot.

## 2015-11-04 ENCOUNTER — Encounter: Payer: BC Managed Care – PPO | Admitting: Occupational Therapy

## 2015-11-10 ENCOUNTER — Ambulatory Visit: Payer: BC Managed Care – PPO | Admitting: Occupational Therapy

## 2015-11-10 DIAGNOSIS — R278 Other lack of coordination: Secondary | ICD-10-CM

## 2015-11-10 DIAGNOSIS — R29810 Facial weakness: Secondary | ICD-10-CM | POA: Diagnosis not present

## 2015-11-10 DIAGNOSIS — M6281 Muscle weakness (generalized): Secondary | ICD-10-CM

## 2015-11-11 NOTE — Therapy (Signed)
[place] MAIN [facility] SERVICES [address] [place], [place], [address] Phone: [phone]   Fax:  [phone]  Occupational Therapy Treatment/Discharge Summary  Patient Details  Name: [name] [name] MRN: [id number] Date of Birth: 04/02/1956 Referring Provider: Read Drivers  Encounter Date: 11/10/2015      OT End of Session - 11/11/15 0940    Visit Number 30   Number of Visits 30   Date for OT Re-Evaluation 10/18/15   Authorization Type 30   OT Start Time 1430   OT Stop Time 1515   OT Time Calculation (min) 45 min   Activity Tolerance Patient tolerated treatment well   Behavior During Therapy [facility] for tasks assessed/performed      Past Medical History:  Diagnosis Date  . Adenopathy   . Anemia   . Hypothyroidism   . Osteopenia   . Raynaud disease   . Scoliosis   . Seizures ([place])   . Shingles   . Sjogren's disease ([place])   . Spleen anomaly   . Stroke [facility]) 20/2017    Past Surgical History:  Procedure Laterality Date  . BREAST CYST ASPIRATION Right 1990's   neg  . FOOT SURGERY Right   . TONSILLECTOMY    . vocal cord surgery  1992   nodules    There were no vitals filed for this visit.      Subjective Assessment - 11/11/15 0843    Subjective  Pt. reports she is going to be featured in a [facility].   Patient is accompained by: Family member   Pertinent History [name] [name] is a 59 y.o.right handed female with history of Sjogrens disease/rheumatoid arthritis, noted history of seizure but not on antiepileptic meds and dysphagia. Patient diagnosed 4 days prior with an placed on Tamiflu. Lives with spouse independent prior to admission. 2 level home with bedroom on main level. 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 Presented 05/29/2015 with right side weakness and gait instability. Patient with severe restrictive airway distress when he presented to the ED with oxygen saturations in the 70s requiring  nonrebreathing mask and later intubated with slow extubation follow-up per critical care pulmonary services. Chest x-ray showed large right lung consolidative changes. CT angiogram chest no evidence of pulmonary emboli. Cranial CT scan negative. 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 Echocardiogram with ejection fraction of 65% no wall motion abnormalities. 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 EEG was negative for seizure. Patient did not receive TPA. Blood cultures showed group A strep pansensitive 05/30/2015 and currently maintained on Rocephin with droplet contact precautions   Patient Stated Goals To regain use of her right hand.            [facility] OT Assessment - 11/11/15 0954      Coordination   Right 9 Hole Peg Test 29 sec.     Strength   Overall Strength Comments Right UE shoulder strength 5/5, Elbow flexion/extension , forearm supination/pronation, wrist extension 5/5     Hand Function   Right Hand Grip (lbs) 28#   Right Hand Lateral Pinch 7 lbs   Right Hand 3 Point Pinch 5 lbs      OT TREATMENT    Measurements were obtained, and goals were reviewed with pt.  Neuro muscular re-education:  Pt. performed [facility] skills training to improve speed and dexterity needed for ADL tasks and writing. Pt. demonstrated grasping  1 inch sticks,  inch cylindrical collars, and  inch flat washers on the [employer] pegboard. Pt. performed grasping each item with her 2nd digit and thumb, and storing them in the palm. Pt. presented with difficulty storing  inch objects at a time in the palmar aspect of the hand.  Therapeutic Exercise:  Pt. Worked on the [employer] for 8 min. Total at level 3.5 with consistent monitoring of the right hand on the handle. Pt. performed gross gripping with grip strengthener. Pt. worked on sustaining grip while grasping pegs and reaching at various heights. Pt. Worked with the gripper in the 3rd resistive slot  of the white resistive spring.                         OT Education - 11/11/15 0940    Education provided Yes   Person(s) Educated Patient   Methods Explanation   Comprehension Verbalized understanding             OT Long Term Goals - 11/11/15 0959      OT LONG TERM GOAL #1   Title Pt. will improve right shoulder flexion strength by 2 mm grades to assist with reaching into cabinetry.   Baseline Pt. is unable   Time 12   Period Weeks   Status Achieved     OT LONG TERM GOAL #2   Title Pt. will improve right grip fisting with digit flexion to [facility] improved by 2 cm in preparation for grasping/holding ADL grooming and feeding items.   Baseline limited digit fisting flexion to [facility].   Time 12   Period Weeks   Status Achieved     OT LONG TERM GOAL #3   Title Pt. will improve right elbow flexion and extension strength by 2 mm grades to be able to apply deoderant and wash left axilla region.   Baseline Pt. requires assist to apply deoderant and wash left axilla.   Time 12   Period Weeks   Status Achieved     OT LONG TERM GOAL #4   Title Pt. will improve right shoulder abduction by 10 degrees to assist with UE dressing.   Baseline Pt. requires assist   Time 12   Period Weeks   Status Achieved     OT LONG TERM GOAL #5   Title Pt. will be independent with light meal prep.   Baseline Pt. requires assist   Time 12   Period Weeks   Status On-going     OT LONG TERM GOAL #6   Title Pt. will improve right lateral and 3 pt. pinch strength by 3# to be able to grasp and hold utensils during self-feeding.   Baseline Pt. is unable to use her right hand to hold the utensils. pt. pinch strength: 5#, Lateral pinch strength 4#   Time 12   Period Weeks   Status Partially Met     OT LONG TERM GOAL #7   Title pt. will improve Right grip strength by 5# to be able to open jars, and container lids.   Baseline Pt. is unable to open jars and lids with right hand. Right  grip strength: 8#   Time 12   Period Weeks   Status Achieved     OT LONG TERM GOAL #8   Title Pt. will  a 3 sentence paragragh efficiently with 75% legiblity.    Baseline Pt. is a ble to print name legibly with increased time.   Time  12   Period Weeks   Status Partially Met     OT LONG TERM GOAL  #9   Baseline Pt. will be indepndent with applying lotion over her left shoulder and scapula region with her right hand.   Time 12   Period Weeks   Status Achieved               Plan - 11/11/15 0941    Clinical Impression Statement Pt. has made excellent progress. Pt. has progressed overall with Right UE strength, grip strength, pinch strength, and coordination for use during ADL abd IADL tasks. Pt. Is ready for discharge from OT skilled services at this time. Pt. Has all HEPs with theraputty and Gripper.   Rehab Potential Good   OT Frequency 2x / week   OT Duration 12 weeks   OT Treatment/Interventions Self-care/ADL training;Electrical Stimulation;Moist Heat;Manual Therapy;DME and/or AE instruction;Therapeutic activities;Passive range of motion;Ultrasound;Therapeutic exercises;Patient/family education;Neuromuscular education;Therapeutic exercise;Visual/perceptual remediation/compensation;Balance training   Consulted and Agree with Plan of Care Patient      Patient will benefit from skilled therapeutic intervention in order to improve the following deficits and impairments:  Decreased balance, Decreased strength, Decreased coordination, Decreased activity tolerance, Decreased range of motion, Impaired UE functional use, Decreased knowledge of use of DME, Decreased endurance, Impaired tone  Visit Diagnosis: Muscle weakness (generalized)  Other lack of coordination    Problem List Patient Active Problem List   Diagnosis Date Noted  . 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 Atrial flutter, unspecified 09/18/2015  . Sjogren's disease ([place]) 09/18/2015  . Acute lower UTI   .  Ischemic stroke of frontal lobe ([place]) 06/16/2015  . Dysphagia as late effect of cerebrovascular disease   . Hemiparesis affecting dominant side as late effect of stroke ([place])   . Esophageal stricture   . Bacteremia   . Atrial flutter ([place])   . Rheumatoid arthritis ([place])   . Hyperglycemia   . Thyroid activity decreased   . Infection due to Streptococcus pyogenes 06/12/2015  . Severe sepsis with septic shock ([place]) 06/12/2015  . Hematuria 06/12/2015  . Dysphagia 06/12/2015  . 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 CVA (cerebral infarction)   . Acute respiratory failure ([place])   . Encounter for diagnostic procedure   . Ventilator dependence ([place])   . Carotid thromboses   . HLD (hyperlipidemia)   . Respiratory failure ([place])   . Stroke (cerebrum) ([place])   . Research study patient   . Endotracheal tube present   . Endotracheally intubated   . Encounter for feeding tube placement   . Altered mental state   . Weakness   . Sepsis ([place]) 05/30/2015  . CAP (community acquired pneumonia)   . Encephalopathy   . Encounter for central line placement   . Hypoxia   . Lactic acidosis    Harrel Carina, MS, OTR/L  Harrel Carina 11/11/2015, 10:01 AM  [place] MAIN [facility] SERVICES [address] [place], [place], [address] Phone: [phone]   Fax:  [phone]  Name: Leiyah Maultsby MRN: [id number] Date of Birth: [birth year]

## 2015-11-11 NOTE — Patient Instructions (Signed)
OT TREATMENT    Measurements were obtained, and goals were reviewed with pt.  Neuro muscular re-education:  Pt. performed [facility] skills training to improve speed and dexterity needed for ADL tasks and writing. Pt. demonstrated grasping 1 inch sticks,  inch cylindrical collars, and  inch flat washers on the [employer] pegboard. Pt. performed grasping each item with her 2nd digit and thumb, and storing them in the palm. Pt. presented with difficulty storing  inch objects at a time in the palmar aspect of the hand.  Therapeutic Exercise:  Pt. Worked on the [employer] for 8 min. Total at level 3.5 with consistent monitoring of the right hand on the handle. Pt. performed gross gripping with grip strengthener. Pt. worked on sustaining grip while grasping pegs and reaching at various heights. Pt. Worked with the gripper in the 3rd resistive slot of the white resistive spring.

## 2015-11-17 ENCOUNTER — Encounter: Payer: BC Managed Care – PPO | Admitting: Occupational Therapy

## 2015-12-23 ENCOUNTER — Encounter: Payer: Self-pay | Admitting: Neurology

## 2015-12-23 ENCOUNTER — Ambulatory Visit (INDEPENDENT_AMBULATORY_CARE_PROVIDER_SITE_OTHER): Payer: BC Managed Care – PPO | Admitting: Neurology

## 2015-12-23 VITALS — BP 101/70 | HR 74 | Ht 60.0 in | Wt 126.0 lb

## 2015-12-23 DIAGNOSIS — I639 Cerebral infarction, unspecified: Secondary | ICD-10-CM

## 2015-12-23 DIAGNOSIS — I6522 Occlusion and stenosis of left carotid artery: Secondary | ICD-10-CM | POA: Diagnosis not present

## 2015-12-23 DIAGNOSIS — I4892 Unspecified atrial flutter: Secondary | ICD-10-CM | POA: Diagnosis not present

## 2015-12-23 DIAGNOSIS — M3500 Sicca syndrome, unspecified: Secondary | ICD-10-CM | POA: Diagnosis not present

## 2015-12-23 DIAGNOSIS — E785 Hyperlipidemia, unspecified: Secondary | ICD-10-CM

## 2015-12-23 NOTE — Progress Notes (Signed)
STROKE NEUROLOGY FOLLOW UP NOTE  NAME: [name] [name] DOB: [birth year]  REASON FOR VISIT: stroke follow up HISTORY FROM: husband and pt and chart  Today we had the pleasure of seeing [name] [name] in follow-up at our Neurology Clinic. Pt was accompanied by husband.   History Summary [name] [name] is a 60 y.o. female with history of Sjogren's disease, seizure, hypothyroidism and dysphagia, and flu 4 days PTA who was admitted on 05/29/15 for weakness and altered mental status. Code stroke called on ER arrival due to right facial droop and flaccid paralysis R arm. In the ED she was found to have pneumonia, sepsis, septic shock and hypooxic respiratory failure and was intubated. Blood culture showed positive for strep pyogens. CSF negative for CNS infection. MRI showed left MCA embolic stroke. Concerning for endocarditis. CTA head and neck showed left CCA thrombus, but no mycotic aneurysm. CTA neck also showed extensive RUL consolidation. EKG also found to have aflutter with variable A-V block as well as SVT. Therefore, due to CCA thrombus and aflutter and without mycotic aneurysm, heparin drip was started. Found to have increased LUE muscle tone, EEG showed left frontal spike activity, but repeat EEG negative. LDL 65 and A1C 6.6. Hypercoagulable work up negative and autoimmune work up showed elevated SSA and SSB and dsDNA. 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 TTE showed EF 60-65% no vegetation. She was put on rocephin for 6 weeks for empirical endocarditis treatment. had dysphagia and PEG placed. Heparin IV switched to ASA and plavix on discharge due to hematuria which was resolved on discharge and aflutter felt like transient due to infection by Dr. Leonie Man. However, pt still has left CCA thrombus which should be treated with anticoagulation as far as I am concerned. She was also started with plaquenil and prednisone for Sjogern's on discharge.   09/17/15 follow up - the  patient has been doing well. She had dramatic recovery. She only has right hand dexterity difficulty currently. Of course, when tired or stressful, she will has language hesitancy and word finding difficulty along with right leg dragging and right hand worsening weakness. Her PEG has removed. She is now on eliquis, following with cardiologist Dr. Clayborn Bigness in [place]. She also follows with her rhematologist for Sjogren's and now on plequenil. BP at low side, 89/61 today, but she stated that her BP regularly low.  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  Interval History During the interval time, pt has been doing well. No stroke like symptoms. She had CTA head and neck showed resolved left CCA clot. She is still on eliquis and she is following up with Dr. Clayborn Bigness at [place]. No sure if the aflutter seen during admission is transient due to infection or paroxysmal which need long term anticoagulation. BP 101/70, no on any BP meds.   REVIEW OF SYSTEMS: Full 14 system review of systems performed and notable only for those listed below and in HPI above, all others are negative:  Constitutional:  fatigue Cardiovascular: leg swelling Ear/Nose/Throat:  Facial swelling, trouble swallowing Skin:  Eyes:  Light sensitivity, blurry vision Respiratory:   Gastroitestinal:   Genitourinary:  Hematology/Lymphatic:  anemia Endocrine:  Musculoskeletal:  Joint pain, back pain, joint swelling, walking difficulty, neck pain Allergy/Immunology:  Env Allergies Neurological:  Memory loss, numbness, weakness, HA, facial drooping Psychiatric: confusion Sleep:   The following represents the patient's updated allergies and side effects list: Allergies  Allergen Reactions  . Erythromycin  Ethylsuccinate Hives and Other (See Comments)  . Keflex [Cephalexin] Hives and Other (See Comments)  . Latex   . Penicillins Other (See Comments)  . Sulfa Antibiotics Other (See Comments)    The neurologically  relevant items on the patient's problem list were reviewed on today's visit.  Neurologic Examination  A problem focused neurological exam (12 or more points of the single system neurologic examination, vital signs counts as 1 point, cranial nerves count for 8 points) was performed.  Blood pressure 101/70, pulse 74, height 5' (1.524 m), weight 126 lb (57.2 kg).  General - Well nourished, well developed, in no apparent distress.  Ophthalmologic - Sharp disc margins OU.   Cardiovascular - Regular rate and rhythm.  Mental Status -  Level of arousal and orientation to time, place, and person were intact. Language including expression, naming, repetition, comprehension was assessed and found intact. Attention span and concentration were normal. Fund of Knowledge was assessed and was intact.  Cranial Nerves II - XII - II - Visual field intact OU. III, IV, VI - Extraocular movements intact. V - Facial sensation intact bilaterally. VII - right nasolabial fold flattening. VIII - Hearing & vestibular intact bilaterally. X - Palate elevates symmetrically. XI - Chin turning & shoulder shrug intact bilaterally. XII - Tongue protrusion intact.  Motor Strength - The patient's strength was normal in all extremities except right hand dexterity difficulty and pronator drift was absent.  Bulk was normal and fasciculations were absent.   Motor Tone - Muscle tone was assessed at the neck and appendages and was normal.  Reflexes - The patient's reflexes were 1+ in all extremities and she had no pathological reflexes.  Sensory - Light touch, temperature/pinprick were assessed and were normal.    Coordination - The patient had normal movements in the hands with no ataxia or dysmetria.  Tremor was absent.  Gait and Station - The patient's transfers, posture, gait, station, and turns were observed as normal.   Data reviewed: I personally reviewed the images and agree with the radiology  interpretations.  [name] [name] 06/03/2015 Acute small LEFT frontal lobe (precentral gyrus, MCA territory) infarct. Mild chronic small vessel ischemic disease.   2D Echocardiogram  - Left ventricle: The cavity size was normal. Systolic function wasnormal. The estimated ejection fraction was in the range of 60%to 65%. Wall motion was normal; there were no regional wallmotion abnormalities. - Mitral valve: There was mild regurgitation. - Tricuspid valve: There was mild regurgitation. - Pulmonic valve: Poorly visualized. - Pulmonary arteries: PA peak pressure: 35 mm Hg (S). Impressions: - There was no evidence of a vegetation.  CTA head and neck -  1. 7 mm filling defect in the distal left common carotid artery consistent with a small focus of thrombus. 2. Otherwise unremarkable appearance of the cervical carotid and vertebral arteries. 3. No major intracranial arterial branch occlusion or significant stenosis. Slight attenuation of distal left MCA branch vessels in the region of the left frontal infarct. 4. Extensive right lung consolidation, incompletely visualized. Small right pleural effusion.  TEE - not able to perform due to esophagus stricture.  CTA head and neck 09/28/15 Resolved thrombus within the LEFT common carotid artery distally. No new findings of atherosclerosis or dissection. Unremarkable intracranial circulation. Chronic LEFT MCA territory infarct. Resolved LEFT pleural effusion.   Component     Latest Ref Rng 06/03/2015 06/04/2015  Cholesterol     0 - 200 mg/dL 95   Triglycerides     <  150 mg/dL 72   HDL Cholesterol     >40 mg/dL 16 (L)   Total CHOL/HDL Ratio      5.9   VLDL     0 - 40 mg/dL 14   LDL (calc)     0 - 99 mg/dL 65   Beta-2 Glycoprotein I Ab, IgG     0 - 20 GPI IgG units  <9  Beta-2-Glycoprotein I IgM     0 - 32 GPI IgM units  <9  Beta-2-Glycoprotein I IgA     0 - 25 GPI IgA units  <9  Anticardiolipin Ab,IgG,Qn     0 - 14  GPL U/mL  <9  Anticardiolipin Ab,IgM,Qn     0 - 12 MPL U/mL  <9  Anticardiolipin Ab,IgA,Qn     0 - 11 APL U/mL  <9  Cytoplasmic (C-ANCA)     Neg:<1:20 titer  <1:20  P-ANCA     Neg:<1:20 titer  <1:20  Atypical P-ANCA titer     Neg:<1:20 titer  <1:20  Hemoglobin A1C     4.8 - 5.6 %  6.6 (H)  Mean Plasma Glucose       143  Homocysteine     0.0 - 15.0 umol/L  5.0  SSA (Ro) (ENA) Antibody, IgG     0.0 - 0.9 AI  >8.0 (H)  SSB (La) (ENA) Antibody, IgG     0.0 - 0.9 AI  >8.0 (H)  ds DNA Ab     0 - 9 IU/mL  15 (H)  RA Latex Turbid.     0.0 - 13.9 IU/mL  10.3  ANA Ab, IFA       Negative    Assessment: As you may recall, she is a 61 y.o. Caucasian female with PMH of Sjogren's disease, seizure, hypothyroidism and dysphagia, and flu 4 days PTA who was admitted on 05/29/15 for weakness and altered mental status. Code stroke called on ER arrival due to right facial droop and flaccid paralysis R arm. In the ED she was found to have pneumonia, sepsis, septic shock and hypooxic respiratory failure and was intubated. Blood culture showed positive for strep pyogens. CSF negative for CNS infection. MRI showed left MCA embolic stroke. Concerning for endocarditis. CTA head and neck showed left CCA thrombus, but no mycotic aneurysm. CTA neck also showed extensive RUL consolidation. EKG also found to have aflutter with variable A-V block as well as SVT. Therefore, due to CCA thrombus and aflutter and without mycotic aneurysm, heparin drip was started. Found to have increased LUE muscle tone, EEG showed left frontal spike activity, but repeat EEG negative. LDL 65 and A1C 6.6. Hypercoagulable work up negative and autoimmune work up showed elevated SSA and SSB and dsDNA. 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 TTE showed EF 60-65% no vegetation. She was put on rocephin for 6 weeks for empirical endocarditis treatment. Had dysphagia and PEG placed. Heparin IV switched to ASA and plavix  on discharge due to hematuria which was resolved on discharge and aflutter felt like transient due to infection by Dr. Leonie Man. However, pt still has left CCA thrombus which should be treated with anticoagulation as far as I am concerned. She was also started with plaquenil and prednisone for Sjogern's on discharge. During the interval time, the patient had dramatic recovery. She only has right hand dexterity difficulty currently. Her PEG has removed. She is now on eliquis, following with cardiologist Dr. Clayborn Bigness in [place]. She also follows  with her rhematologist for Sjogren's and now on plequenil. Repeat CTA head and neck showed resolution of left CCA thrombus. She is going to follow up with Dr. Clayborn Bigness to discuss about long term anticoagulation, either continue eliquis for life to be on the safe side, or consider loop recorder to rule in or rule out aflutter.   Plan:  - continue eliquis and lipitor for stroke prevention for now. - discuss with Dr. Clayborn Bigness if loop recorder can be considered to monitor your heart rhythm - continue home exercise of right hand - follow up with cardiology, rheumatology and Dr. Martina Sinner - Follow up with your primary care physician for stroke risk factor modification. Recommend maintain blood pressure goal <130/80, diabetes with hemoglobin A1c goal below 6.5% and lipids with LDL cholesterol goal below 70 mg/dL.  - check BP at home and record. - follow up in 6 months.   I spent more than 25 minutes of face to face time with the patient. Greater than 50% of time was spent in counseling and coordination of care. We discussed the needs of long term anticoagulation, cardiac monitoring, follow up with cardiology and continue anticoagulation for now.   No orders of the defined types were placed in this encounter.   Meds ordered this encounter  Medications  . fluticasone (FLONASE) 50 MCG/ACT nasal spray    Sig: Place into the nose.    Patient Instructions  -  continue eliquis and lipitor for stroke prevention for now. - discuss with your cardiologist to see if loop recorder can be considered to monitor your heart rhythm - continue home exercise of right hand - follow up with cardiology, rheumatology and Dr. Martina Sinner - Follow up with your primary care physician for stroke risk factor modification. Recommend maintain blood pressure goal <130/80, diabetes with hemoglobin A1c goal below 6.5% and lipids with LDL cholesterol goal below 70 mg/dL.  - check BP at home and record. - follow up in 6 months.    [name] Hawking, MD PhD [facility] [address] [place], [place] [address] [phone]

## 2015-12-23 NOTE — Patient Instructions (Signed)
-   continue eliquis and lipitor for stroke prevention for now. - discuss with your cardiologist to see if loop recorder can be considered to monitor your heart rhythm - continue home exercise of right hand - follow up with cardiology, rheumatology and Dr. Martina Sinner - Follow up with your primary care physician for stroke risk factor modification. Recommend maintain blood pressure goal <130/80, diabetes with hemoglobin A1c goal below 6.5% and lipids with LDL cholesterol goal below 70 mg/dL.  - check BP at home and record. - follow up in 6 months.

## 2016-01-27 ENCOUNTER — Ambulatory Visit (INDEPENDENT_AMBULATORY_CARE_PROVIDER_SITE_OTHER): Payer: BC Managed Care – PPO | Admitting: Internal Medicine

## 2016-01-27 ENCOUNTER — Encounter: Payer: Self-pay | Admitting: Internal Medicine

## 2016-01-27 VITALS — BP 100/68 | HR 72 | Ht 60.0 in | Wt 135.1 lb

## 2016-01-27 DIAGNOSIS — I4892 Unspecified atrial flutter: Secondary | ICD-10-CM

## 2016-01-27 NOTE — Progress Notes (Signed)
ELECTROPHYSIOLOGY CONSULT NOTE  Patient ID: [name] [name], MRN: [id number], DOB/AGE: 07/15/1955 60 y.o. Admit date: (Not on file) Date of Consult: 01/27/2016  Primary Physician: [name] Fennel, MD Primary Cardiologist: [name] [name] Physician [place]  Chief Complaint: ?reveal monitor   HPI [name] [name] is a 60 y.o. female  referred for consideration of all Linq implanted  She has a history of a prior stroke. She presented 2/17 with altered mental status and right-sided weakness. She was felt to have had a CCA thrombus on the left and has been treated with anticoagulation. ECGs at that time demonstrated atrial fibrillation. The old medical record describes atrial flutter.  Her presentation to/17 however occurred in the context of pneumonia sepsis and shock with respiratory failure requiring intubation. She had positive blood cultures for strep and was felt to have possible endocarditis TEE having been attempted but unsuccessful because of esophageal stricture and she was treated empirically with antibiotics.  Trans thoracic echocardiogram demonstrated normal LV function and normal in size  She's had significant interval improvement following her stroke. She continues with palpitations; these antedate her stroke by many many years the mechanism of these palpitations has not been elucidated    Past Medical History:  Diagnosis Date  . Adenopathy   . Anemia   . Hypothyroidism   . Osteopenia   . Raynaud disease   . Scoliosis   . Seizures ([place])   . Shingles   . Sjogren's disease ([place])   . Spleen anomaly   . Stroke [place]) 20/2017      Surgical History:  Past Surgical History:  Procedure Laterality Date  . BREAST CYST ASPIRATION Right 1990's   neg  . FOOT SURGERY Right   . TONSILLECTOMY    . vocal cord surgery  1992   nodules     Home Meds: Prior to Admission medications   Medication Sig Start Date End Date Taking? Authorizing Provider  apixaban (ELIQUIS) 5 MG  TABS tablet Take 5 mg by mouth 2 (two) times daily. 08/16/15  Yes Historical Provider, MD  atorvastatin (LIPITOR) 20 MG tablet Place 1 tablet (20 mg total) into feeding tube daily at 6 PM. 08/23/15  Yes [name] Blake, MD  cetirizine (ZYRTEC) 10 MG tablet Take 10 mg by mouth daily.   Yes Historical Provider, MD  Ferrous Sulfate (SLOW FE PO) Take by mouth.   Yes Historical Provider, MD  fluticasone (FLONASE) 50 MCG/ACT nasal spray Place into the nose.   Yes Historical Provider, MD  hydroxychloroquine (PLAQUENIL) 200 MG tablet Take 200 mg by mouth daily.    Yes Historical Provider, MD  levothyroxine (SYNTHROID, LEVOTHROID) 100 MCG tablet Take 100 mcg by mouth daily. 08/13/15 08/12/16 Yes Historical Provider, MD  metoprolol tartrate (LOPRESSOR) 25 MG tablet Take 25 mg by mouth daily as needed. 12/28/15 12/27/16 Yes Historical Provider, MD  vitamin B-12 (CYANOCOBALAMIN) 1000 MCG tablet Take 1,000 mcg by mouth daily.   Yes Historical Provider, MD    Allergies:  Allergies  Allergen Reactions  . Erythromycin Ethylsuccinate Hives and Other (See Comments)  . Keflex [Cephalexin] Hives and Other (See Comments)  . Latex   . Penicillins Other (See Comments)  . Sulfa Antibiotics Other (See Comments)    Social History   Social History  . Marital status: Married    Spouse name: [name] [name]  . Number of children: 3  . Years of education: 18   Occupational History  .      kindergarten teacher  Social History Main Topics  . Smoking status: Never Smoker  . Smokeless tobacco: Never Used  . Alcohol use No  . Drug use: No  . Sexual activity: Not on file   Other Topics Concern  . Not on file   Social History Narrative   Lives with husband in home   Caffeine use- occas ice tea     Family History  Problem Relation Age of Onset  . Cancer Mother     pancreatic  . Dementia Father   . Diabetes Sister     type 1  . Diabetes Brother     type 1  . Breast cancer Maternal Grandmother     breast      ROS:  Please see the history of present illness.     All other systems reviewed and negative.    Physical Exam: Blood pressure 100/68, pulse 72, height 5' (1.524 m), weight 135 lb 1.9 oz (61.3 kg). General: Well developed, well nourished female in no acute distress. Head: Normocephalic, atraumatic, sclera non-icteric, no xanthomas, nares are without discharge. EENT: normal  Lymph Nodes:  none Neck: Negative for carotid bruits. JVD not elevated. Back:without scoliosis kyphosis Lungs: Clear bilaterally to auscultation without wheezes, rales, or rhonchi. Breathing is unlabored. Heart: RRR with S1 S2. No  murmur . No rubs, or gallops appreciated. Abdomen: Soft, non-tender, non-distended with normoactive bowel sounds. No hepatomegaly. No rebound/guarding. No obvious abdominal masses. Msk:  Strength and tone appear normal for age. Mild right-sided weakness Extremities: No clubbing or cyanosis. No  edema.  Distal pedal pulses are 2+ and equal bilaterally. Skin: Warm and Dry Neuro: Alert and oriented X 3. CN III-XII intact Grossly normal sensory and motor function . Psych:  Responds to questions appropriately with a normal affect.      Labs: Cardiac Enzymes No results for input(s): CKTOTAL, CKMB, TROPONINI in the last 72 hours. CBC Lab Results  Component Value Date   WBC 9.9 06/24/2015   HGB 9.3 (L) 06/24/2015   HCT 29.6 (L) 06/24/2015   MCV 96.1 06/24/2015   PLT 633 (H) 06/24/2015   PROTIME: No results for input(s): LABPROT, INR in the last 72 hours. Chemistry No results for input(s): NA, K, CL, CO2, BUN, CREATININE, CALCIUM, PROT, BILITOT, ALKPHOS, ALT, AST, GLUCOSE in the last 168 hours.  Invalid input(s): LABALBU Lipids Lab Results  Component Value Date   CHOL 95 06/03/2015   HDL 16 (L) 06/03/2015   LDLCALC 65 06/03/2015   TRIG 72 06/03/2015   BNP No results found for: PROBNP Thyroid Function Tests: No results for input(s): TSH, T4TOTAL, T3FREE, THYROIDAB in the last  72 hours.  Invalid input(s): FREET3 Miscellaneous No results found for: DDIMER  Radiology/Studies:  No results found.  EKG: Sinus rhythm at 72 Intervals 13/08/41 Axis LXXX Right atrial enlargement   Assessment and Plan:  Stroke with intermittent significant recovery  Left carotid artery embolus noted  Atrial fibrillation-in the context of sepsis and pneumonia and bacteremia  Palpitations  Sjgren syndrome    The patient is currently on anticoagulation based on neurological assessment of therapies given her noted CCA embolism   with her history of atrial fibrillation obvious in the context of the pneumonia, the decision has been made to treat her with anticoagulation and I, with my limited understanding of CCA embolus, would concur I have reviewed with the family data from [facility] in terms of risk of major bleeding as well as reviewing efficacy data.   at this juncture they  are comfortable with continuing anticoagulation and I would recommended lifelong. The role of an implantable monitor does not provide sufficient information that would cause [place] to discontinue anticoagulation and the long-standing palpitations are not sufficiently problematic that a diagnosis is required to dictate specific therapies  Hence, we have elected to continue her on her anticoagulation without proceeding with monitor.     Virl Axe

## 2016-01-27 NOTE — Patient Instructions (Signed)
Medication Instructions: - Your physician recommends that you continue on your current medications as directed. Please refer to the Current Medication list given to you today.  Labwork: - none ordered  Procedures/Testing: - none ordered  Follow-Up: - Dr. Klein will see you back on an as needed basis.  Any Additional Special Instructions Will Be Listed Below (If Applicable).     If you need a refill on your cardiac medications before your next appointment, please call your pharmacy.   

## 2016-01-31 ENCOUNTER — Encounter: Payer: Self-pay | Admitting: Physical Medicine & Rehabilitation

## 2016-01-31 ENCOUNTER — Ambulatory Visit (HOSPITAL_BASED_OUTPATIENT_CLINIC_OR_DEPARTMENT_OTHER): Payer: BC Managed Care – PPO | Admitting: Physical Medicine & Rehabilitation

## 2016-01-31 ENCOUNTER — Encounter: Payer: BC Managed Care – PPO | Attending: Physical Medicine & Rehabilitation

## 2016-01-31 VITALS — BP 106/72 | HR 67 | Resp 14

## 2016-01-31 DIAGNOSIS — R1310 Dysphagia, unspecified: Secondary | ICD-10-CM | POA: Insufficient documentation

## 2016-01-31 DIAGNOSIS — I4892 Unspecified atrial flutter: Secondary | ICD-10-CM | POA: Diagnosis not present

## 2016-01-31 DIAGNOSIS — K222 Esophageal obstruction: Secondary | ICD-10-CM | POA: Insufficient documentation

## 2016-01-31 DIAGNOSIS — R531 Weakness: Secondary | ICD-10-CM | POA: Diagnosis present

## 2016-01-31 DIAGNOSIS — R569 Unspecified convulsions: Secondary | ICD-10-CM | POA: Insufficient documentation

## 2016-01-31 DIAGNOSIS — R5383 Other fatigue: Secondary | ICD-10-CM | POA: Diagnosis not present

## 2016-01-31 DIAGNOSIS — E039 Hypothyroidism, unspecified: Secondary | ICD-10-CM | POA: Diagnosis not present

## 2016-01-31 DIAGNOSIS — Z9889 Other specified postprocedural states: Secondary | ICD-10-CM | POA: Diagnosis not present

## 2016-01-31 DIAGNOSIS — M3500 Sicca syndrome, unspecified: Secondary | ICD-10-CM | POA: Diagnosis not present

## 2016-01-31 DIAGNOSIS — M069 Rheumatoid arthritis, unspecified: Secondary | ICD-10-CM | POA: Diagnosis not present

## 2016-01-31 DIAGNOSIS — I7300 Raynaud's syndrome without gangrene: Secondary | ICD-10-CM | POA: Insufficient documentation

## 2016-01-31 DIAGNOSIS — G5602 Carpal tunnel syndrome, left upper limb: Secondary | ICD-10-CM | POA: Diagnosis not present

## 2016-01-31 DIAGNOSIS — E785 Hyperlipidemia, unspecified: Secondary | ICD-10-CM | POA: Diagnosis not present

## 2016-01-31 DIAGNOSIS — B998 Other infectious disease: Secondary | ICD-10-CM | POA: Insufficient documentation

## 2016-01-31 DIAGNOSIS — M8580 Other specified disorders of bone density and structure, unspecified site: Secondary | ICD-10-CM | POA: Diagnosis not present

## 2016-01-31 DIAGNOSIS — I638 Other cerebral infarction: Secondary | ICD-10-CM | POA: Insufficient documentation

## 2016-01-31 DIAGNOSIS — I69359 Hemiplegia and hemiparesis following cerebral infarction affecting unspecified side: Secondary | ICD-10-CM

## 2016-01-31 NOTE — Progress Notes (Signed)
Subjective:    Patient ID: [name] [name], female    DOB: February 25, 1956, 60 y.o.   MRN: [id number]  HPI  60 year old female with history of rheumatoid arthritis who developed respiratory failure requiring intubation in February 2017 and later small left frontal lobe infarct. She developed severe despite she had required gastrostomy tube placed. After stabilization. She attended inpatient rehabilitation and then progress to outpatient rehabilitation. 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 She responded well to splinting and did not require carpal injection.  Carpal Tunnel symptom improved  Occ use of hand splint  Heart 30day event monitor with electrophysiology f/u  [place] at PCP office a few months ago, no injury  Does Wii at home, walking for exercise, goes to gym for recumbent bike, leg extensions and treadmill   Pain Inventory Average Pain 0 Pain Right Now 0 My pain is no pain  In the last 24 hours, has pain interfered with the following? General activity 0 Relation with others 0 Enjoyment of life 0 What TIME of day is your pain at its worst? no pain Sleep (in general) Good  Pain is worse with: no pain Pain improves with: no pain Relief from Meds: no pain  Mobility walk without assistance how many minutes can you walk? 30-45 minutes ability to climb steps?  yes do you drive?  no Do you have any goals in this area?  no  Function disabled: date disabled 05/29/2015 I need assistance with the following:  meal prep and household duties  Neuro/Psych weakness trouble walking dizziness confusion  Prior Studies Any changes since last visit?  no  Physicians involved in your care Any changes since last visit?  no   Family History  Problem Relation Age of Onset  . Cancer Mother     pancreatic  . Dementia Father   . Diabetes Sister     type 1  . Diabetes  Brother     type 1  . Breast cancer Maternal Grandmother     breast   Social History   Social History  . Marital status: Married    Spouse name: Coralyn Mark  . Number of children: 3  . Years of education: 23   Occupational History  .      kindergarten teacher   Social History Main Topics  . Smoking status: Never Smoker  . Smokeless tobacco: Never Used  . Alcohol use No  . Drug use: No  . Sexual activity: Not Asked   Other Topics Concern  . None   Social History Narrative   Lives with husband in home   Caffeine use- occas ice tea   Past Surgical History:  Procedure Laterality Date  . BREAST CYST ASPIRATION Right 1990's   neg  . FOOT SURGERY Right   . TONSILLECTOMY    . vocal cord surgery  1992   nodules   Past Medical History:  Diagnosis Date  . Adenopathy   . Anemia   . Hypothyroidism   . Osteopenia   . Raynaud disease   . Scoliosis   . Seizures ([place])   . Shingles   . Sjogren's disease ([place])   . Spleen anomaly   . Stroke ([place]) 20/2017   BP 106/72   Pulse 67   Resp 14   SpO2 95%   Opioid Risk Score:   Fall Risk Score:  `1  Depression screen PHQ 2/9  Depression screen  [facility] 2/9 08/09/2015 07/12/2015  Decreased Interest 0 0  Down, Depressed, Hopeless 0 0  PHQ - 2 Score 0 0      Review of Systems  Constitutional: Positive for unexpected weight change.  All other systems reviewed and are negative.      Objective:   Physical Exam  Constitutional: She is oriented to person, place, and time. She appears well-developed and well-nourished.  HENT:  Head: Normocephalic and atraumatic.  Cardiovascular: Normal rate and regular rhythm.   Neurological: She is alert and oriented to person, place, and time.  Psychiatric: She has a normal mood and affect.  Nursing note and vitals reviewed.  Motor strength is 4/5 in the right deltoid, biceps, triceps, grip 5/5 in the left deltoid, bicep, tricep, grip, 5/5 bilateral hip flexion, knee extension, ankle dorsi  flexion, plantar flexion  Negative Tinel's at the left wrist. Negative hand intrinsic  atrophy      Assessment & Plan:  1. Mild residual right upper extremity weakness, status post left frontal infarct. 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 Fatigue remains a factor, as well as right upper stomach coordination. Balance remains impaired as well, although she does not require an assistive device at the current time.  2. Left carpal tunnel syndrome. Gets occasional symptoms when she is very active, but has good relief with splinting. No additional treatment needed at this time.  Follow-up physical medicine rehabilitation on as-needed basis.

## 2016-03-13 ENCOUNTER — Other Ambulatory Visit: Payer: Self-pay | Admitting: Obstetrics and Gynecology

## 2016-05-09 ENCOUNTER — Other Ambulatory Visit: Payer: Self-pay | Admitting: Obstetrics and Gynecology

## 2016-05-09 DIAGNOSIS — Z1231 Encounter for screening mammogram for malignant neoplasm of breast: Secondary | ICD-10-CM

## 2016-06-07 ENCOUNTER — Ambulatory Visit
Admission: RE | Admit: 2016-06-07 | Discharge: 2016-06-07 | Disposition: A | Discharge status: Home / Self Care | Payer: BC Managed Care – PPO | Source: Ambulatory Visit | Attending: Obstetrics and Gynecology | Admitting: Obstetrics and Gynecology

## 2016-06-07 DIAGNOSIS — Z1231 Encounter for screening mammogram for malignant neoplasm of breast: Secondary | ICD-10-CM | POA: Diagnosis not present

## 2016-06-20 ENCOUNTER — Ambulatory Visit: Payer: BC Managed Care – PPO | Admitting: Neurology

## 2016-10-21 IMAGING — US US ABDOMEN LIMITED
1 series · 14 of 25 positions shown · non-contrast
Comparison: None.

CLINICAL DATA: Elevated LFTs

EXAM:
US ABDOMEN LIMITED - RIGHT UPPER QUADRANT

[Series 1: us abdomen limited · 0.14mm/px · 14 of 27 slices shown]
[im 1/27]
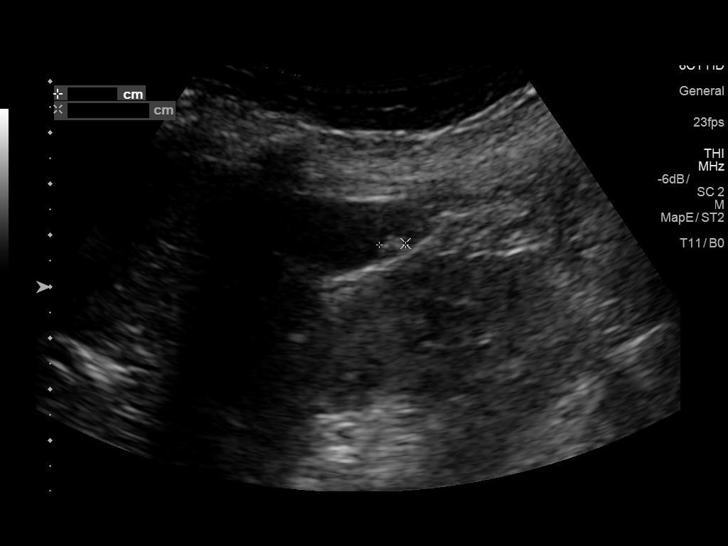
[im 3/27]
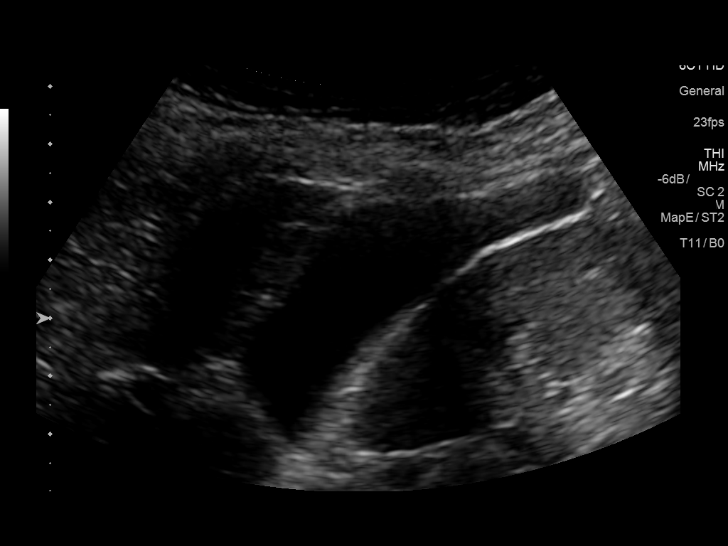
[im 5/27]
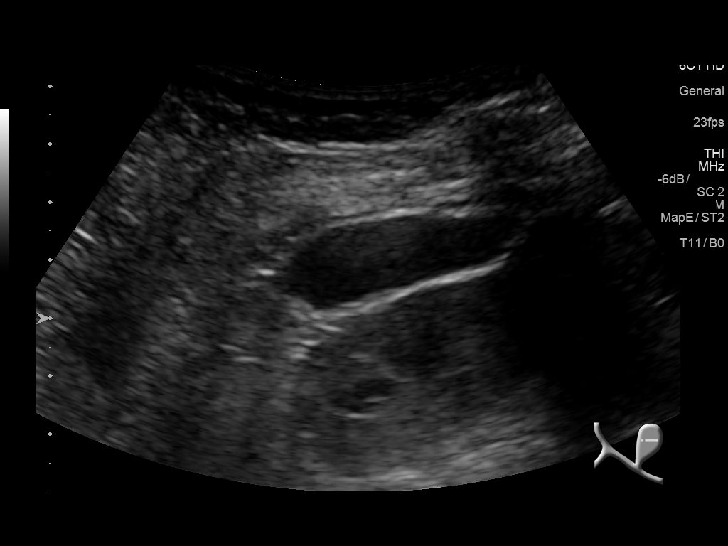
[im 7/27]
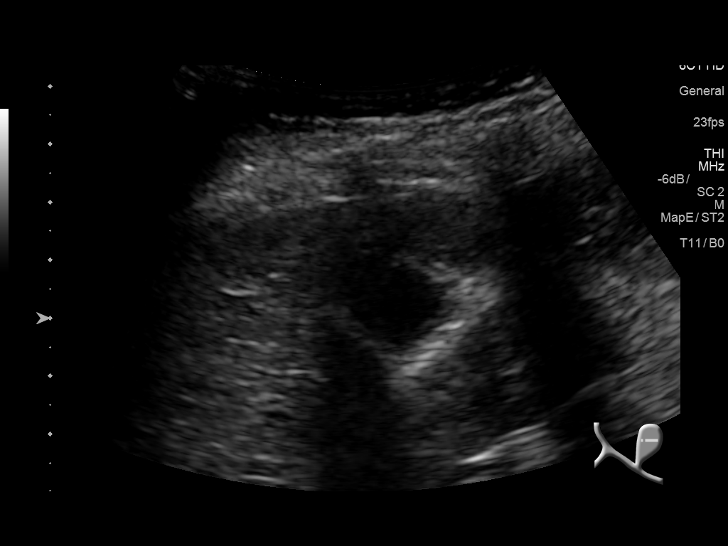
[im 9/27]
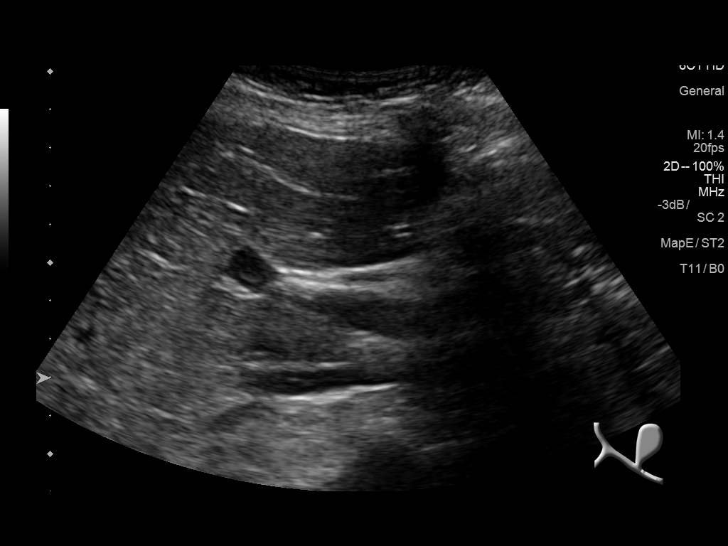
[im 10/27]
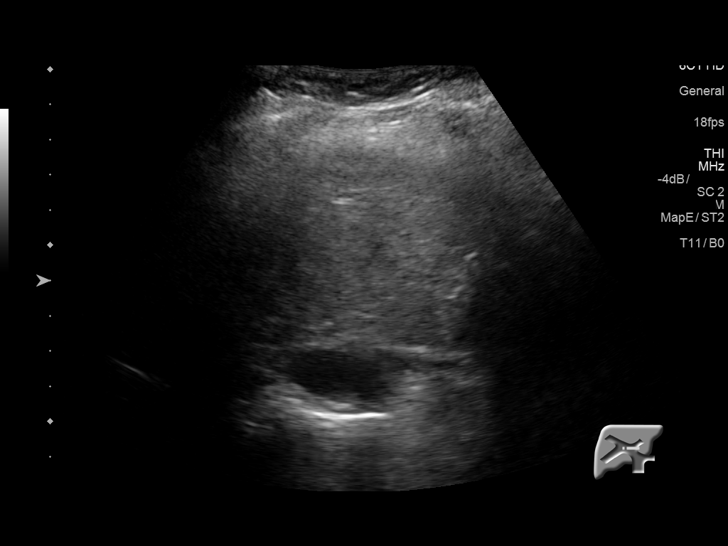
[im 12/27]
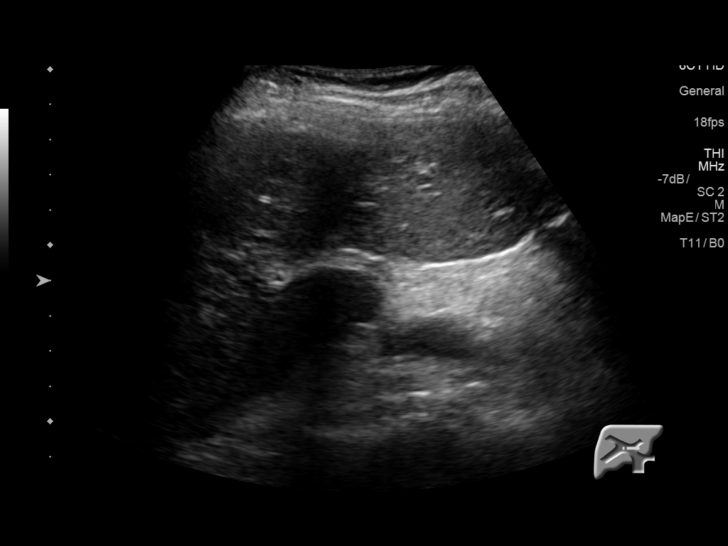
[im 15/27]
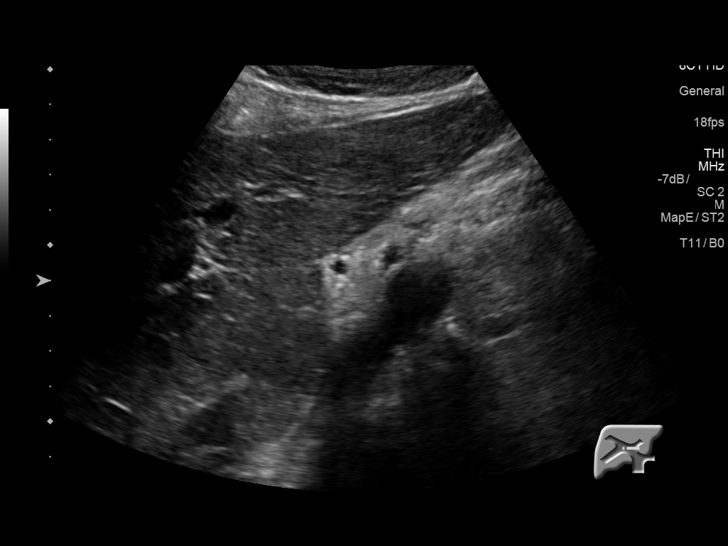
[im 17/27]
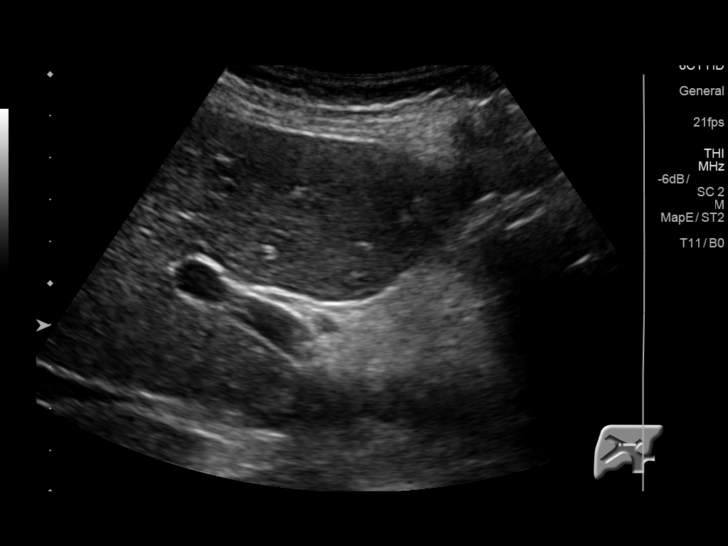
[im 18/27]
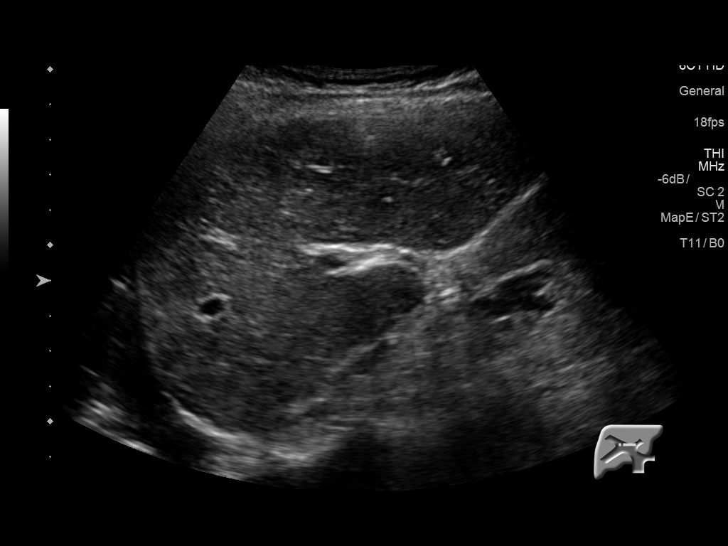
[im 20/27]
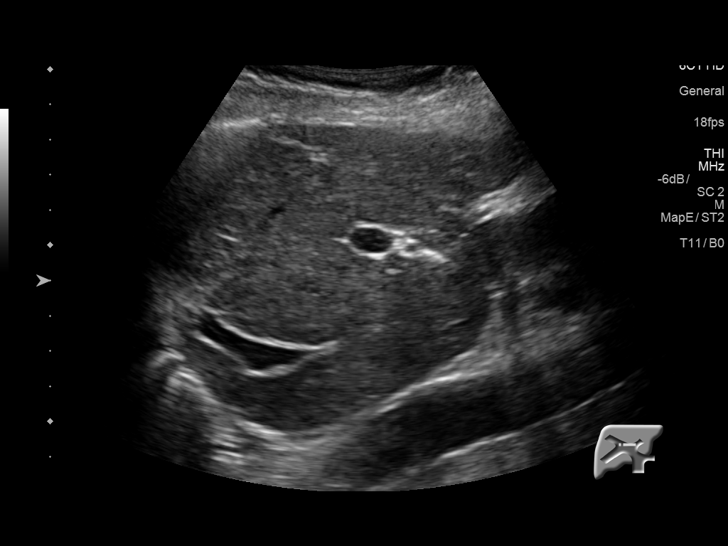
[im 22/27]
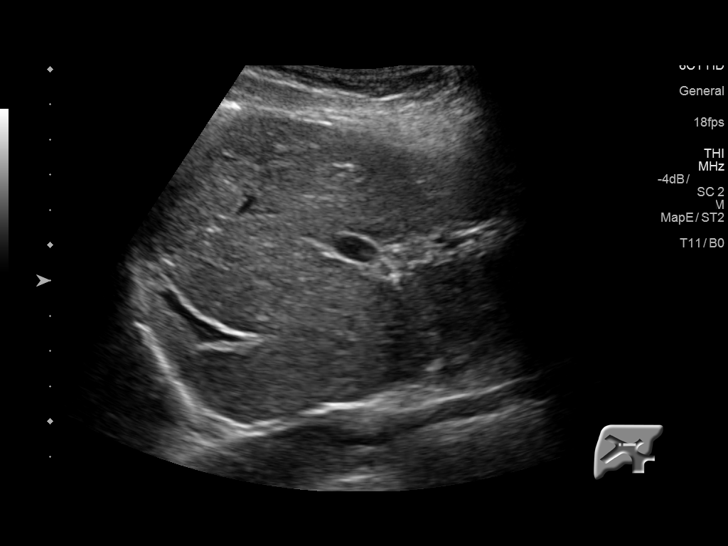
[im 24/27]
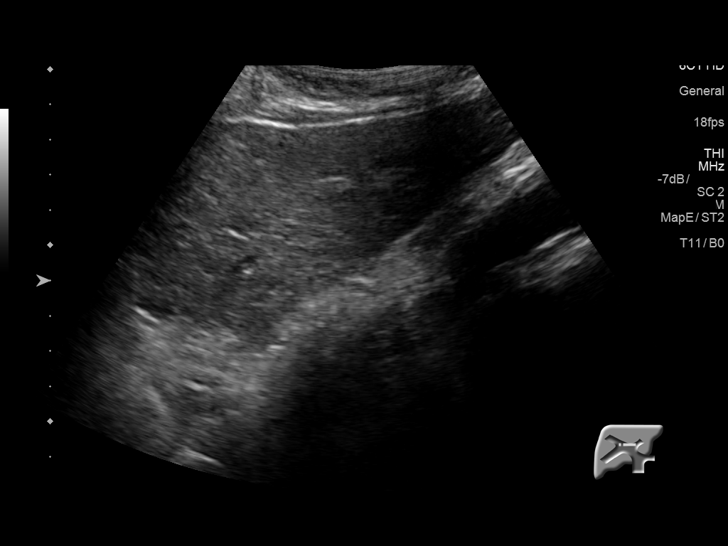
[im 27/27]
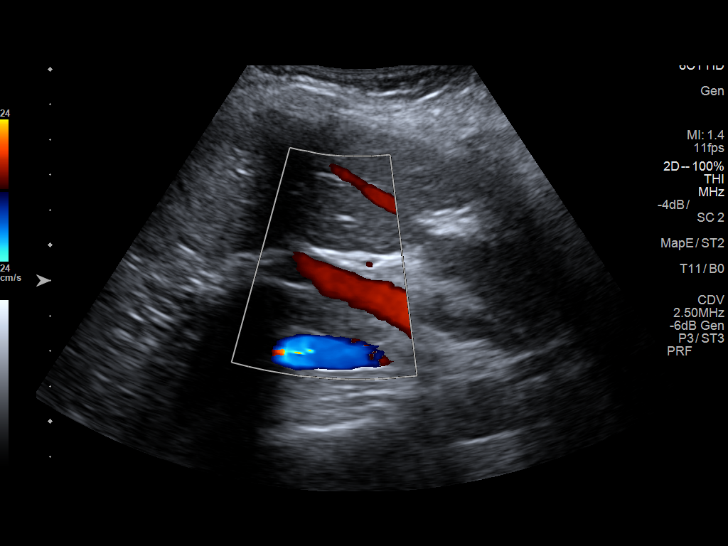

[14 of 25 positions shown; findings below may reference images not displayed]

FINDINGS: Gallbladder:

Gallstones are identified. No wall thickening or pericholecystic
fluid is noted. Negative sonographic Murphy sign is noted.

Common bile duct:

Diameter: 1.9 mm.

Liver:

No focal lesion identified. Within normal limits in parenchymal
echogenicity.

Incidental note is made of a small right-sided pleural effusion.
IMPRESSION: Cholelithiasis.

Right pleural effusion.

## 2017-02-06 IMAGING — CR DG CHEST 1V PORT
1 series · 1 of 1 positions shown · non-contrast
Comparison: June 01, 2015

CLINICAL DATA: Hypoxia/respiratory failure

EXAM:
PORTABLE CHEST 1 VIEW

[AP]
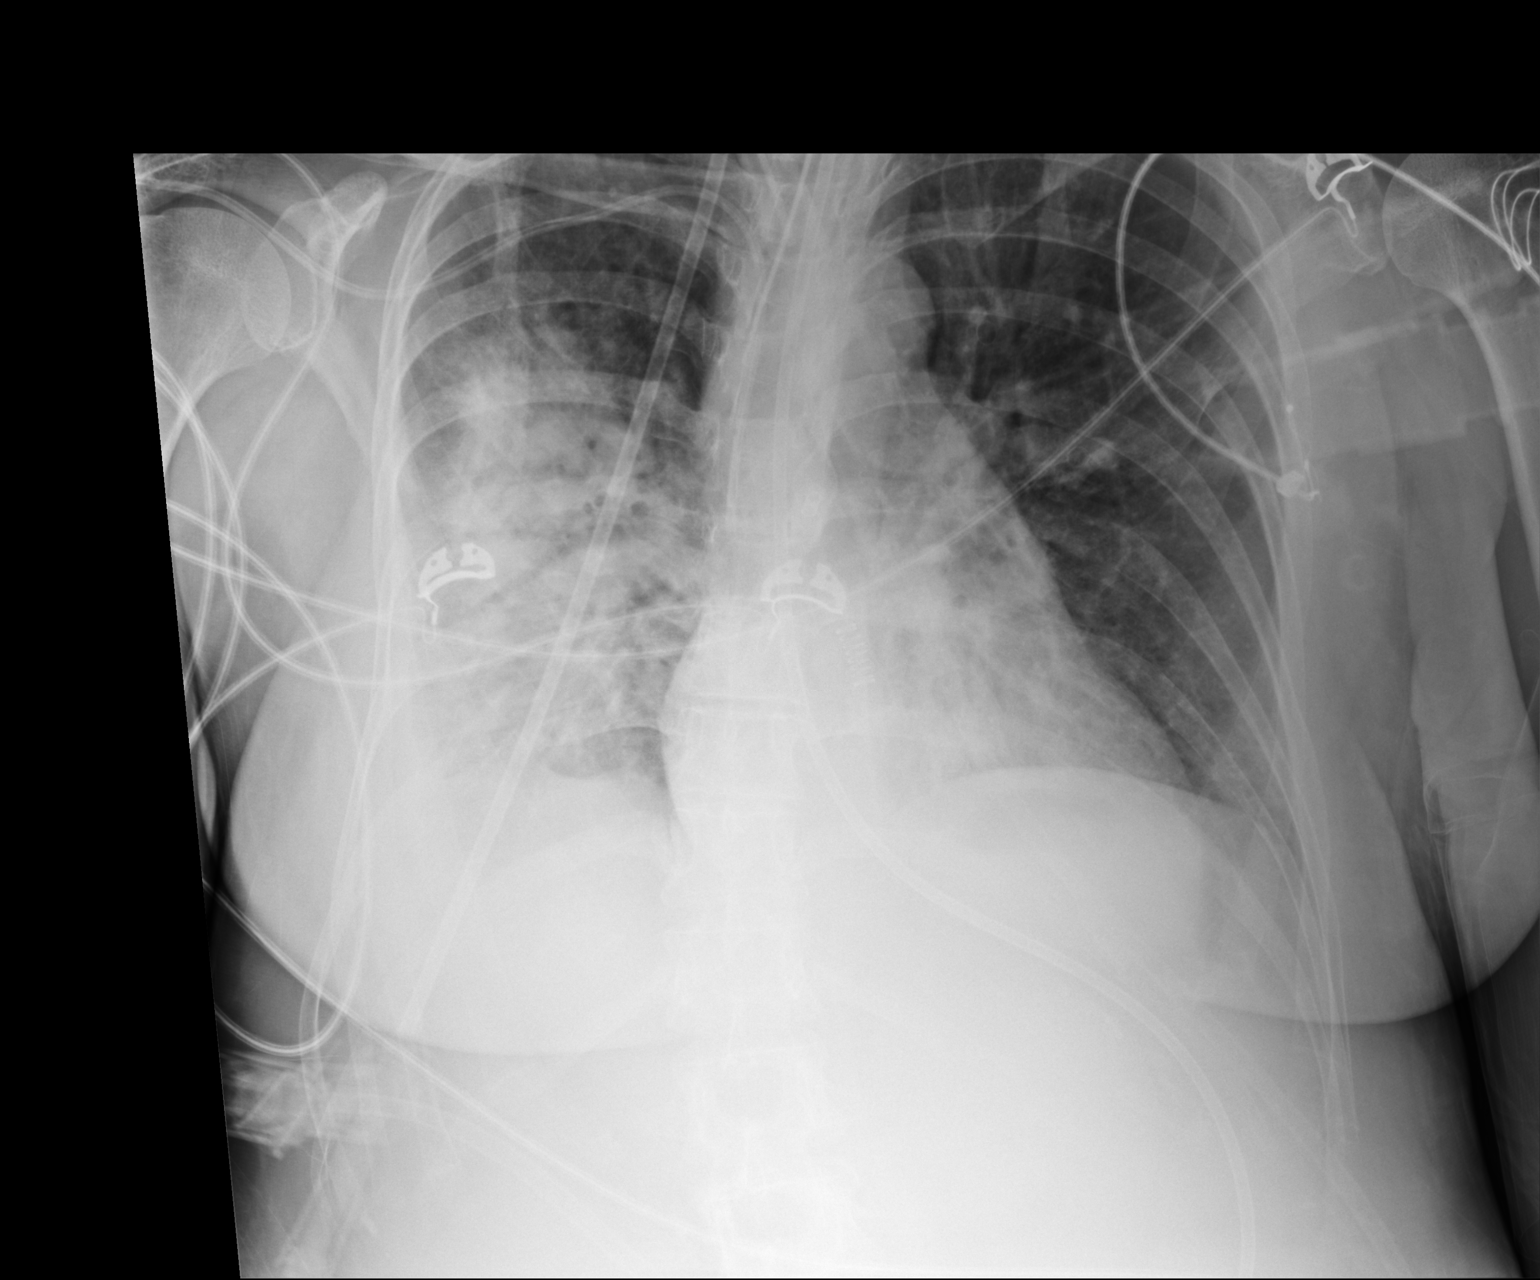

[1 of 1 positions shown; findings below may reference images not displayed]

FINDINGS: Endotracheal tube tip is 1.3 cm above the carina. Central catheter
tip is in the superior vena cava. Feeding tube tip is below the
diaphragm. No pneumothorax. There is extensive airspace
consolidation through much of the right mid and lower lung zones.
There is patchy consolidation in the medial left base. Heart size
and pulmonary vascularity are normal. No adenopathy.
IMPRESSION: Tube and catheter positions as described without pneumothorax.
Persistent extensive consolidation throughout much the right lung.
There is patchy airspace consolidation in the medial left base,
marginally increased from 1 day prior. No change in cardiac
silhouette.

## 2017-02-07 IMAGING — CR DG CHEST 1V PORT
1 series · 1 of 1 positions shown · non-contrast
Comparison: 06/02/2015

CLINICAL DATA: Endotracheal intubation.

EXAM:
PORTABLE CHEST 1 VIEW

[AP]
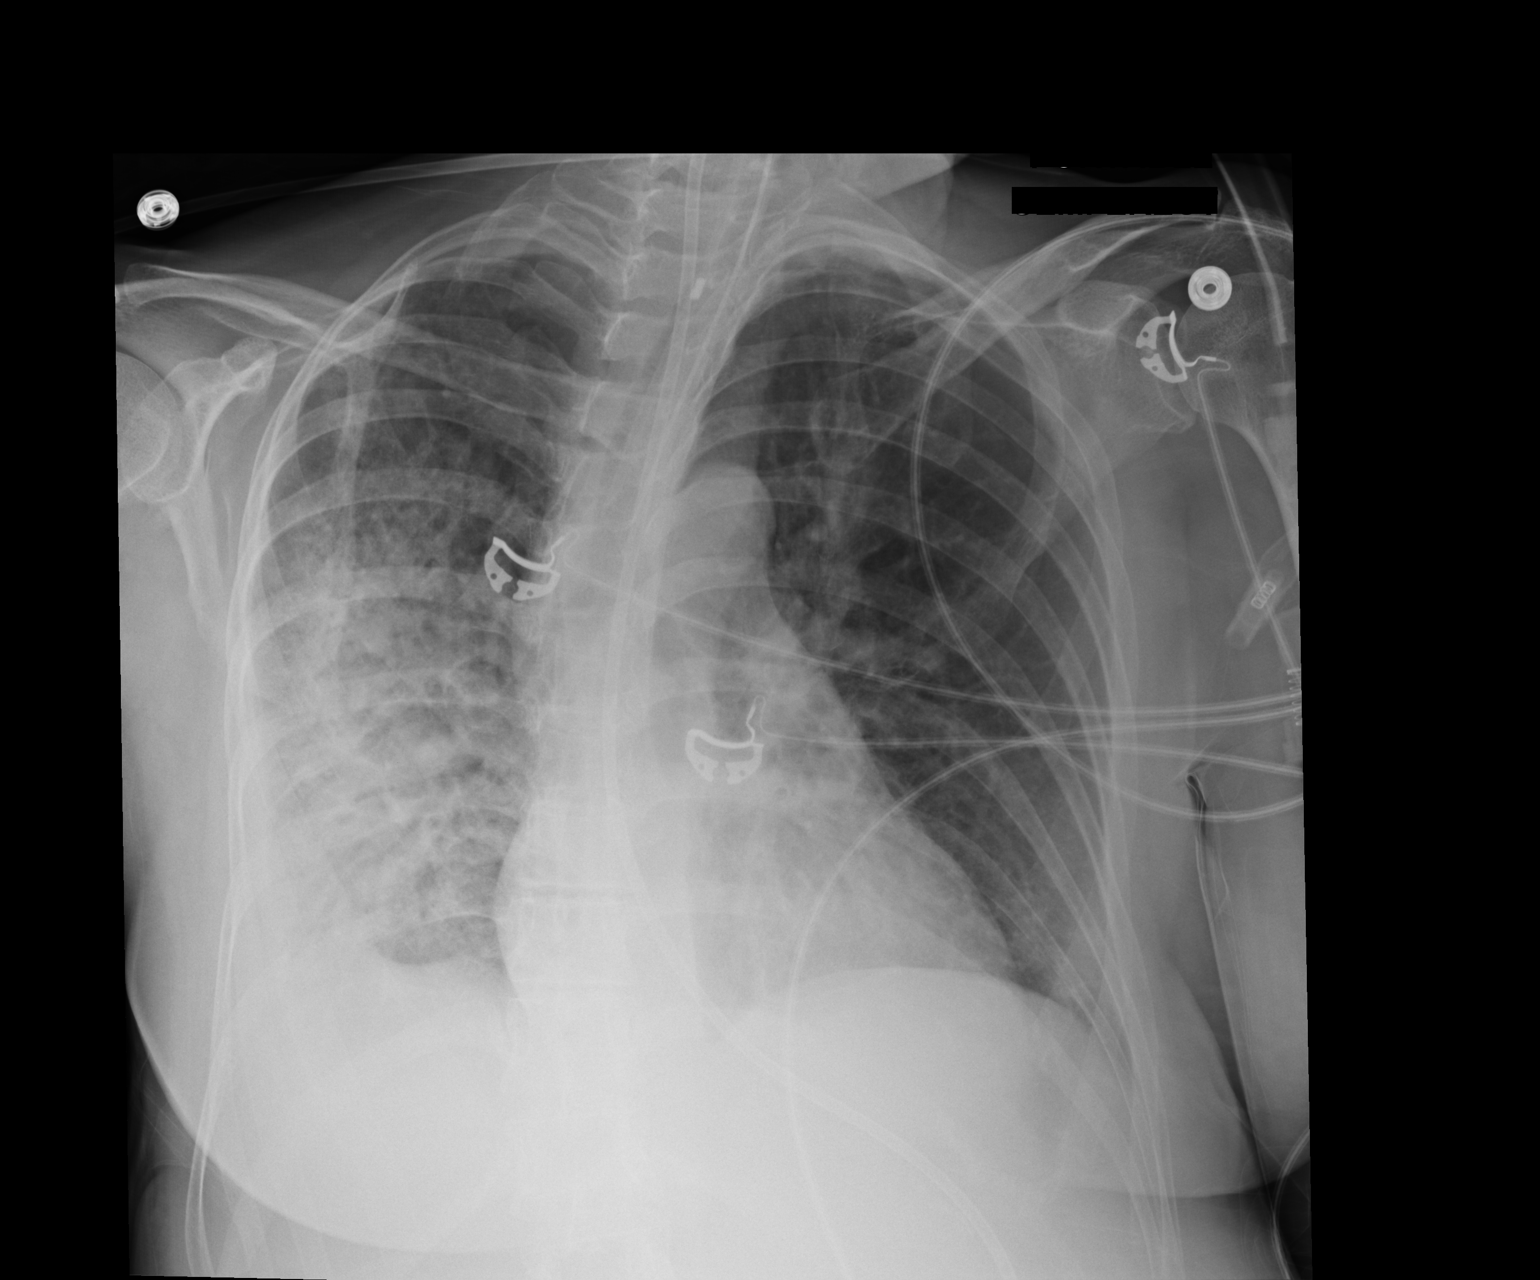

[1 of 1 positions shown; findings below may reference images not displayed]

FINDINGS: Examination is limited due to patient rotation. Endotracheal tube is
present with tip measuring about 2.7 cm above the carina. An enteric
tube is present. The tip is off the field of view but is below the
left hemidiaphragm. Heart size and pulmonary vascularity are normal.
Diffuse airspace disease in the right mid and lower lung zone likely
representing pneumonia. Small right pleural effusion. Mild left
perihilar infiltration. No pneumothorax. Right central venous
catheter appears to have been removed.
IMPRESSION: Appliances appear in satisfactory location. Right mid and lower lung
zone infiltrates with small right pleural effusion suggesting
pneumonia. Mild left perihilar infiltration. No change since
previous study, lying for differences in patient positioning.

## 2017-02-08 IMAGING — CR DG CHEST 1V PORT
1 series · 1 of 1 positions shown · non-contrast
Comparison: June 03, 2015

CLINICAL DATA: Hypoxia

EXAM:
PORTABLE CHEST 1 VIEW

[AP]
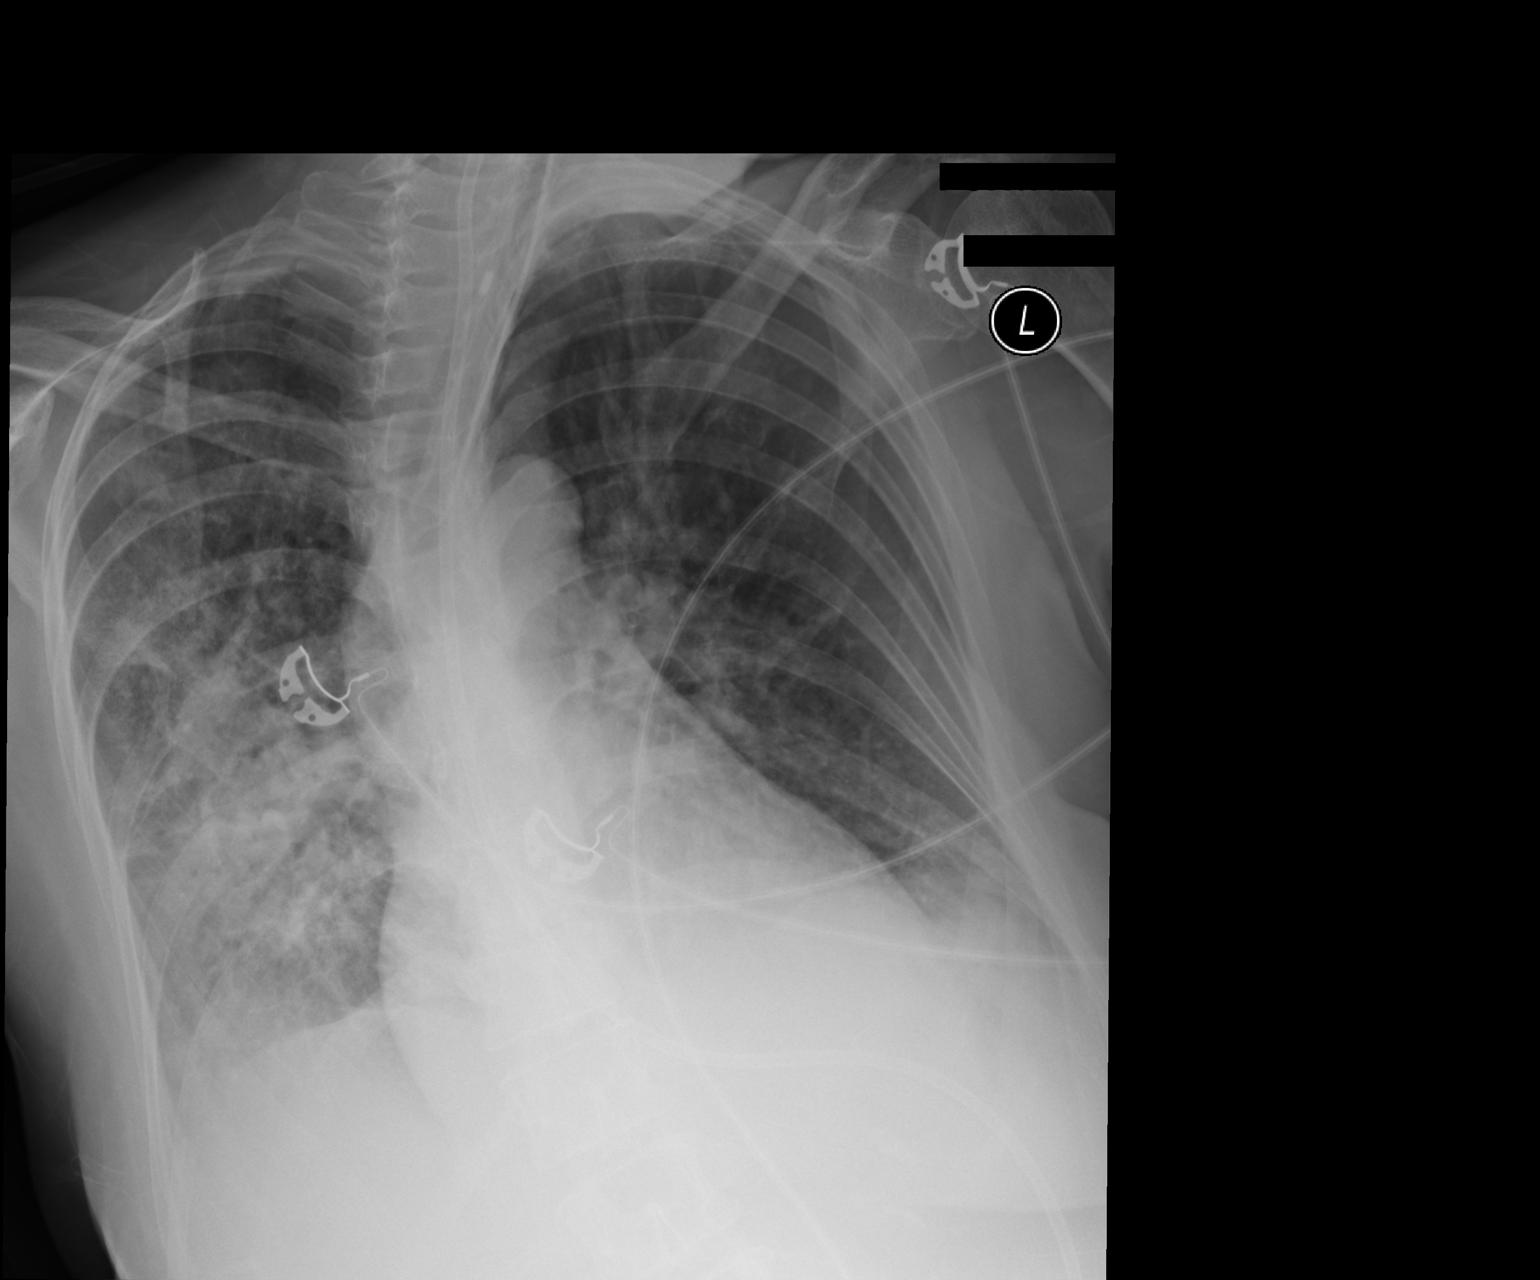

[1 of 1 positions shown; findings below may reference images not displayed]

FINDINGS: Endotracheal tube tip is 2.7 cm above the carina. Feeding tube tip
is below the diaphragm. No pneumothorax. There is extensive airspace
consolidation in portions of the right mid and lower lung zones,
slightly less than 1 day prior. Mild patchy infiltrate in the left
base is stable. No new opacity is seen. Heart is upper normal in
size with pulmonary vascularity within normal limits.
IMPRESSION: Extensive airspace consolidation throughout right mid and lower lung
zones, slightly less than 1 day prior. Suspect pneumonia. Mild
patchy infiltrate in the left base is also present, stable. No
change in cardiac silhouette. Tube and catheter positions are as
described without pneumothorax.

## 2017-02-14 IMAGING — CR DG CHEST 1V PORT
1 series · 1 of 1 positions shown · non-contrast
Comparison: 06/08/2015

CLINICAL DATA: Respiratory failure and short of breath

EXAM:
PORTABLE CHEST 1 VIEW

[AP]
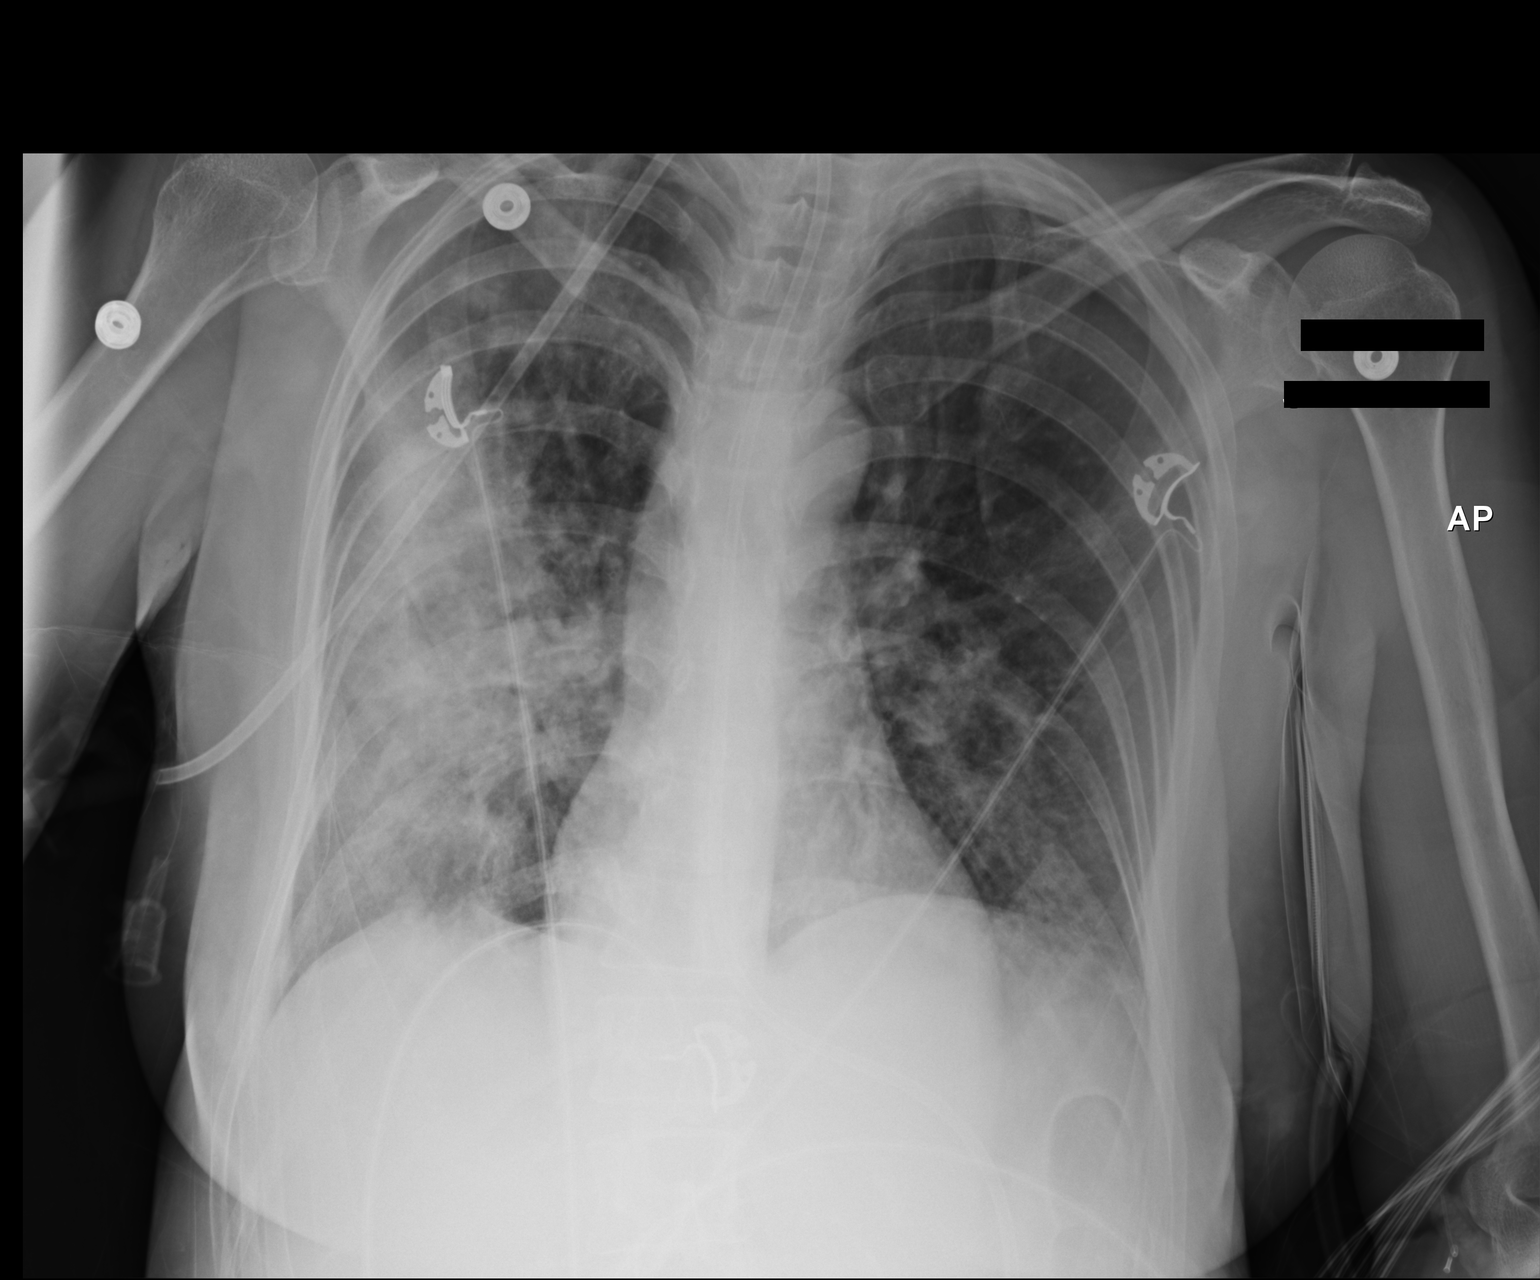

[1 of 1 positions shown; findings below may reference images not displayed]

FINDINGS: Extensive infiltrate in the right lung shows progression. No
effusion on the right.

Mild left lower lobe infiltrate shows mild progression. No effusion
on the left.

Feeding tube enters the stomach with the tip not visualized.
IMPRESSION: Progression of bilateral infiltrate right greater than left
consistent with pneumonia.

## 2017-05-11 ENCOUNTER — Other Ambulatory Visit: Payer: Self-pay | Admitting: Obstetrics and Gynecology

## 2017-05-11 DIAGNOSIS — Z1231 Encounter for screening mammogram for malignant neoplasm of breast: Secondary | ICD-10-CM

## 2017-05-30 ENCOUNTER — Ambulatory Visit: Payer: BC Managed Care – PPO | Admitting: Anesthesiology

## 2017-05-30 ENCOUNTER — Ambulatory Visit
Admission: RE | Admit: 2017-05-30 | Discharge: 2017-05-30 | Disposition: A | Discharge status: Home / Self Care | Payer: BC Managed Care – PPO | Source: Ambulatory Visit | Attending: Internal Medicine | Admitting: Internal Medicine

## 2017-05-30 ENCOUNTER — Encounter
Admission: RE | Disposition: A | Discharge status: Home / Self Care | Payer: Self-pay | Source: Ambulatory Visit | Attending: Internal Medicine

## 2017-05-30 ENCOUNTER — Encounter: Payer: Self-pay | Admitting: *Deleted

## 2017-05-30 DIAGNOSIS — Z880 Allergy status to penicillin: Secondary | ICD-10-CM | POA: Insufficient documentation

## 2017-05-30 DIAGNOSIS — Z8619 Personal history of other infectious and parasitic diseases: Secondary | ICD-10-CM | POA: Insufficient documentation

## 2017-05-30 DIAGNOSIS — Z8673 Personal history of transient ischemic attack (TIA), and cerebral infarction without residual deficits: Secondary | ICD-10-CM | POA: Insufficient documentation

## 2017-05-30 DIAGNOSIS — Z882 Allergy status to sulfonamides status: Secondary | ICD-10-CM | POA: Insufficient documentation

## 2017-05-30 DIAGNOSIS — E039 Hypothyroidism, unspecified: Secondary | ICD-10-CM | POA: Diagnosis not present

## 2017-05-30 DIAGNOSIS — Z1211 Encounter for screening for malignant neoplasm of colon: Secondary | ICD-10-CM | POA: Insufficient documentation

## 2017-05-30 DIAGNOSIS — Z79899 Other long term (current) drug therapy: Secondary | ICD-10-CM | POA: Insufficient documentation

## 2017-05-30 DIAGNOSIS — Z86010 Personal history of colonic polyps: Secondary | ICD-10-CM | POA: Insufficient documentation

## 2017-05-30 DIAGNOSIS — Z7901 Long term (current) use of anticoagulants: Secondary | ICD-10-CM | POA: Diagnosis not present

## 2017-05-30 DIAGNOSIS — D120 Benign neoplasm of cecum: Secondary | ICD-10-CM | POA: Diagnosis not present

## 2017-05-30 DIAGNOSIS — Z881 Allergy status to other antibiotic agents status: Secondary | ICD-10-CM | POA: Insufficient documentation

## 2017-05-30 DIAGNOSIS — M419 Scoliosis, unspecified: Secondary | ICD-10-CM | POA: Insufficient documentation

## 2017-05-30 DIAGNOSIS — E785 Hyperlipidemia, unspecified: Secondary | ICD-10-CM | POA: Insufficient documentation

## 2017-05-30 DIAGNOSIS — I739 Peripheral vascular disease, unspecified: Secondary | ICD-10-CM | POA: Diagnosis not present

## 2017-05-30 DIAGNOSIS — M3500 Sicca syndrome, unspecified: Secondary | ICD-10-CM | POA: Diagnosis not present

## 2017-05-30 DIAGNOSIS — R531 Weakness: Secondary | ICD-10-CM | POA: Diagnosis not present

## 2017-05-30 DIAGNOSIS — M8580 Other specified disorders of bone density and structure, unspecified site: Secondary | ICD-10-CM | POA: Diagnosis not present

## 2017-05-30 DIAGNOSIS — I7300 Raynaud's syndrome without gangrene: Secondary | ICD-10-CM | POA: Insufficient documentation

## 2017-05-30 HISTORY — DX: Hyperlipidemia, unspecified: E78.5

## 2017-05-30 HISTORY — PX: COLONOSCOPY WITH PROPOFOL: SHX5780

## 2017-05-30 HISTORY — DX: Allergy, unspecified, initial encounter: T78.40XA

## 2017-05-30 SURGERY — COLONOSCOPY WITH PROPOFOL
Anesthesia: General

## 2017-05-30 MED ORDER — SODIUM CHLORIDE 0.9 % IV SOLN
INTRAVENOUS | Status: DC | PRN
  Administered 2017-05-30: 14:00:00 via INTRAVENOUS

## 2017-05-30 MED ORDER — PROPOFOL 10 MG/ML IV BOLUS
INTRAVENOUS | Status: AC
  Filled 2017-05-30: qty 20

## 2017-05-30 MED ORDER — SODIUM CHLORIDE 0.9 % IV SOLN
INTRAVENOUS | Status: DC
  Administered 2017-05-30: 1000 mL via INTRAVENOUS

## 2017-05-30 MED ORDER — PHENYLEPHRINE HCL 10 MG/ML IJ SOLN
INTRAMUSCULAR | Status: DC | PRN
  Administered 2017-05-30: 100 ug via INTRAVENOUS

## 2017-05-30 MED ORDER — PROPOFOL 10 MG/ML IV BOLUS
INTRAVENOUS | Status: DC | PRN
  Administered 2017-05-30: 100 mg via INTRAVENOUS

## 2017-05-30 MED ORDER — PROPOFOL 500 MG/50ML IV EMUL
INTRAVENOUS | Status: DC | PRN
  Administered 2017-05-30: 125 ug/kg/min via INTRAVENOUS
  Administered 2017-05-30: 75 ug/kg/min via INTRAVENOUS

## 2017-05-30 MED ORDER — LIDOCAINE HCL (CARDIAC) 20 MG/ML IV SOLN
INTRAVENOUS | Status: DC | PRN
  Administered 2017-05-30: 100 mg via INTRAVENOUS

## 2017-05-30 NOTE — H&P (Signed)
Outpatient short stay form Pre-procedure 05/30/2017 2:32 PM Nicole Hafley K. Alice Reichert, M.D.  Primary Physician: Derinda Late, M.D.  Reason for visit:  Personal hx of colon polyps.  History of present illness: Patient presents for colonoscopy for colon polyp surveillance. The patient denies complaints of abdominal pain, significant change in bowel habits, or rectal bleeding.     Current Facility-Administered Medications:  .  0.9 %  sodium chloride infusion, , Intravenous, Continuous, [place], Benay Pike, MD, Last Rate: 20 mL/hr at 05/30/17 1407, 1,000 mL at 05/30/17 1407  Medications Prior to Admission  Medication Sig Dispense Refill Last Dose  . apixaban (ELIQUIS) 5 MG TABS tablet Take 5 mg by mouth 2 (two) times daily.   Past Week at Unknown time  . atorvastatin (LIPITOR) 20 MG tablet Place 1 tablet (20 mg total) into feeding tube daily at 6 PM. 30 tablet 1 Past Week at Unknown time  . cetirizine (ZYRTEC) 10 MG tablet Take 10 mg by mouth daily.   Past Week at Unknown time  . Ferrous Sulfate (SLOW FE PO) Take by mouth.   Past Week at Unknown time  . fluticasone (FLONASE) 50 MCG/ACT nasal spray Place into the nose.   Past Week at Unknown time  . hydroxychloroquine (PLAQUENIL) 200 MG tablet Take 200 mg by mouth daily.    Past Week at Unknown time  . levothyroxine (SYNTHROID, LEVOTHROID) 125 MCG tablet Take 125 mcg by mouth daily before breakfast.   05/29/2017 at Unknown time  . vitamin B-12 (CYANOCOBALAMIN) 1000 MCG tablet Take 1,000 mcg by mouth daily.   Past Week at Unknown time  . levothyroxine (SYNTHROID, LEVOTHROID) 100 MCG tablet Take 100 mcg by mouth daily.   Taking     Allergies  Allergen Reactions  . Erythromycin Ethylsuccinate Hives and Other (See Comments)  . Keflex [Cephalexin] Hives and Other (See Comments)  . Latex   . Penicillins Other (See Comments)  . Sulfa Antibiotics Other (See Comments)     Past Medical History:  Diagnosis Date  . Adenopathy   . Allergic state   .  Anemia   . Hyperlipidemia   . Hypothyroidism   . Osteopenia   . Raynaud disease   . Raynaud disease   . Scoliosis   . Seizures ([place])   . Shingles   . Sjogren's disease ([place])   . Spleen anomaly   . Stroke [facility]) 20/2017    Review of systems:      Physical Exam  General appearance: alert, cooperative and appears stated age Resp: clear to auscultation bilaterally Cardio: regular rate and rhythm, S1, S2 normal, no murmur, click, rub or gallop GI: soft, non-tender; bowel sounds normal; no masses,  no organomegaly Extremities: extremities normal, atraumatic, no cyanosis or edema     Planned procedures:  Colonoscopy. The patient understands the nature of the planned procedure, indications, risks, alternatives and potential complications including but not limited to bleeding, infection, perforation, damage to internal organs and possible oversedation/side effects from anesthesia. The patient agrees and gives consent to proceed.  Please refer to procedure notes for findings, recommendations and patient disposition/instructions.    Zitlaly Malson K. Alice Reichert, M.D. Gastroenterology 05/30/2017  2:32 PM

## 2017-05-30 NOTE — Anesthesia Post-op Follow-up Note (Signed)
Anesthesia QCDR form completed.        

## 2017-05-30 NOTE — Transfer of Care (Signed)
Immediate Anesthesia Transfer of Care Note  Patient: [name] [name]  Procedure(s) Performed: COLONOSCOPY WITH PROPOFOL (N/A )  Patient Location: Endoscopy Unit  Anesthesia Type:General  Level of Consciousness: awake  Airway & Oxygen Therapy: Patient connected to nasal cannula oxygen  Post-op Assessment: Post -op Vital signs reviewed and stable  Post vital signs: stable  Last Vitals:  Vitals:   05/30/17 1508 05/30/17 1510  BP: (!) 87/45 (!) 92/55  Pulse:  77  Resp:  17  Temp: (!) 36.1 C 36.4 C  SpO2:  100%    Last Pain:  Vitals:   05/30/17 1510  TempSrc: Tympanic      Patients Stated Pain Goal: 0 (29/56/21 3086)  Complications: No apparent anesthesia complications

## 2017-05-30 NOTE — Anesthesia Preprocedure Evaluation (Addendum)
Anesthesia Evaluation  Patient identified by MRN, date of birth, ID band Patient awake    Reviewed: Allergy & Precautions, H&P , NPO status , reviewed documented beta blocker date and time   Airway Mallampati: II  TM Distance: >3 FB     Dental  (+) Missing, Caps   Pulmonary pneumonia,    Pulmonary exam normal        Cardiovascular + Peripheral Vascular Disease  Normal cardiovascular exam     Neuro/Psych Seizures -,  CVA, Residual Symptoms    GI/Hepatic   Endo/Other  Hypothyroidism   Renal/GU      Musculoskeletal   Abdominal   Peds  Hematology  (+) anemia ,   Anesthesia Other Findings Mild R side weakness, off Eliquis  Reproductive/Obstetrics                             Anesthesia Physical Anesthesia Plan  ASA: III  Anesthesia Plan: General   Post-op Pain Management:    Induction:   PONV Risk Score and Plan: Propofol infusion  Airway Management Planned:   Additional Equipment:   Intra-op Plan:   Post-operative Plan:   Informed Consent: I have reviewed the patients History and Physical, chart, labs and discussed the procedure including the risks, benefits and alternatives for the proposed anesthesia with the patient or authorized representative who has indicated his/her understanding and acceptance.   Dental Advisory Given  Plan Discussed with: CRNA  Anesthesia Plan Comments:         Anesthesia Quick Evaluation

## 2017-05-30 NOTE — Op Note (Signed)
[facility] Gastroenterology Patient Name: [name] [name] Procedure Date: 05/30/2017 2:30 PM MRN: [id number] Account #: [id number] Date of Birth: 1956-03-11 Admit Type: Outpatient Age: 62 Room: [facility] ENDO ROOM 4 Gender: Female Note Status: Finalized Procedure:            Colonoscopy Indications:          High risk colon cancer surveillance: Personal history                        of colonic polyps Providers:            Benay Pike. Toledo MD, MD Medicines:            Propofol per Anesthesia Complications:        No immediate complications. Procedure:            Pre-Anesthesia Assessment:                       - The risks and benefits of the procedure and the                        sedation options and risks were discussed with the                        patient. All questions were answered and informed                        consent was obtained.                       - Patient identification and proposed procedure were                        verified prior to the procedure by the nurse. The                        procedure was verified in the procedure room.                       - ASA Grade Assessment: III - A patient with severe                        systemic disease.                       - After reviewing the risks and benefits, the patient                        was deemed in satisfactory condition to undergo the                        procedure.                       After obtaining informed consent, the colonoscope was                        passed under direct vision. Throughout the procedure,                        the patient's blood pressure, pulse, and oxygen  saturations were monitored continuously. The                        Colonoscope was introduced through the anus and                        advanced to the the cecum, identified by appendiceal                        orifice and ileocecal valve. The colonoscopy was             performed without difficulty. The patient tolerated the                        procedure well. The quality of the bowel preparation                        was good. The ileocecal valve, appendiceal orifice, and                        rectum were photographed. Findings:      The perianal and digital rectal examinations were normal. Pertinent       negatives include normal sphincter tone and no palpable rectal lesions.      A 4 mm polyp was found in the cecum. The polyp was sessile. The polyp       was removed with a jumbo cold forceps. Resection and retrieval were       complete.      The exam was otherwise without abnormality on direct and retroflexion       views. Impression:           - One 4 mm polyp in the cecum, removed with a jumbo                        cold forceps. Resected and retrieved.                       - The examination was otherwise normal on direct and                        retroflexion views. Recommendation:       - Patient has a contact number available for                        emergencies. The signs and symptoms of potential                        delayed complications were discussed with the patient.                        Return to normal activities tomorrow. Written discharge                        instructions were provided to the patient.                       - Resume previous diet.                       - Continue present medications.                       -  Await pathology results.                       - Repeat colonoscopy is recommended for surveillance.                        The colonoscopy date will be determined after pathology                        results from today's exam become available for review.                       - The findings and recommendations were discussed with                        the patient and their spouse. Procedure Code(s):    --- Professional ---                       [phone], Colonoscopy, flexible; with biopsy,  single or                        multiple Diagnosis Code(s):    --- Professional ---                       Z86.010, Personal history of colonic polyps                       D12.0, Benign neoplasm of cecum CPT copyright 2016 [employer]. All rights reserved. The codes documented in this report are preliminary and upon coder review may  be revised to meet current compliance requirements. Efrain Sella MD, MD 05/30/2017 3:05:30 PM This report has been signed electronically. Number of Addenda: 0 Note Initiated On: 05/30/2017 2:30 PM Scope Withdrawal Time: 0 hours 6 minutes 34 seconds  Total Procedure Duration: 0 hours 10 minutes 51 seconds       [facility]

## 2017-05-30 NOTE — Interval H&P Note (Signed)
History and Physical Interval Note:  05/30/2017 2:34 PM  [name] [name]  has presented today for surgery, with the diagnosis of PX HX COLON POLYPS  The various methods of treatment have been discussed with the patient and family. After consideration of risks, benefits and other options for treatment, the patient has consented to  Procedure(s): COLONOSCOPY WITH PROPOFOL (N/A) as a surgical intervention .  The patient's history has been reviewed, patient examined, no change in status, stable for surgery.  I have reviewed the patient's chart and labs.  Questions were answered to the patient's satisfaction.     [place], [place]

## 2017-05-31 ENCOUNTER — Encounter: Payer: Self-pay | Admitting: Internal Medicine

## 2017-05-31 NOTE — Anesthesia Postprocedure Evaluation (Signed)
Anesthesia Post Note  Patient: [name] [name]  Procedure(s) Performed: COLONOSCOPY WITH PROPOFOL (N/A )  Patient location during evaluation: Endoscopy Anesthesia Type: General Level of consciousness: awake and alert Pain management: pain level controlled Vital Signs Assessment: post-procedure vital signs reviewed and stable Respiratory status: spontaneous breathing Cardiovascular status: blood pressure returned to baseline and stable Postop Assessment: no apparent nausea or vomiting Anesthetic complications: no     Last Vitals:  Vitals:   05/30/17 1538 05/30/17 1541  BP: (!) 103/52   Pulse: 66 72  Resp: 16 (!) 21  Temp:    SpO2: 99% 100%    Last Pain:  Vitals:   05/31/17 0824  TempSrc:   PainSc: 2                  Alphonsus Sias

## 2017-05-31 NOTE — OR Nursing (Signed)
C/O 'hives' . Along hip line.,left arm. PT is to call md. This am . No s.o.b, temp.

## 2017-06-01 LAB — SURGICAL PATHOLOGY

## 2017-06-11 ENCOUNTER — Ambulatory Visit
Admission: RE | Admit: 2017-06-11 | Discharge: 2017-06-11 | Disposition: A | Discharge status: Home / Self Care | Payer: BC Managed Care – PPO | Source: Ambulatory Visit | Attending: Obstetrics and Gynecology | Admitting: Obstetrics and Gynecology

## 2017-06-11 DIAGNOSIS — Z1231 Encounter for screening mammogram for malignant neoplasm of breast: Secondary | ICD-10-CM | POA: Insufficient documentation

## 2017-07-18 NOTE — Addendum Note (Signed)
Addendum  created 07/18/17 1029 by Alphonsus Sias, MD   Attestation recorded in [place], [place] filed

## 2018-05-14 ENCOUNTER — Other Ambulatory Visit: Payer: Self-pay | Admitting: Obstetrics and Gynecology

## 2018-05-14 DIAGNOSIS — Z1231 Encounter for screening mammogram for malignant neoplasm of breast: Secondary | ICD-10-CM

## 2018-06-12 ENCOUNTER — Ambulatory Visit
Admission: RE | Admit: 2018-06-12 | Discharge: 2018-06-12 | Disposition: A | Discharge status: Home / Self Care | Payer: Medicare HMO | Source: Ambulatory Visit | Attending: Obstetrics and Gynecology | Admitting: Obstetrics and Gynecology

## 2018-06-12 DIAGNOSIS — Z1231 Encounter for screening mammogram for malignant neoplasm of breast: Secondary | ICD-10-CM | POA: Insufficient documentation

## 2019-05-22 ENCOUNTER — Other Ambulatory Visit: Payer: Self-pay | Admitting: Obstetrics and Gynecology

## 2019-05-22 DIAGNOSIS — Z1231 Encounter for screening mammogram for malignant neoplasm of breast: Secondary | ICD-10-CM

## 2019-05-29 ENCOUNTER — Ambulatory Visit: Payer: Medicare HMO

## 2019-05-30 ENCOUNTER — Ambulatory Visit: Payer: Medicare HMO

## 2019-08-04 ENCOUNTER — Ambulatory Visit
Admission: RE | Admit: 2019-08-04 | Discharge: 2019-08-04 | Disposition: A | Discharge status: Home / Self Care | Payer: Medicare HMO | Source: Ambulatory Visit | Attending: Obstetrics and Gynecology | Admitting: Obstetrics and Gynecology

## 2019-08-04 DIAGNOSIS — Z1231 Encounter for screening mammogram for malignant neoplasm of breast: Secondary | ICD-10-CM | POA: Diagnosis not present

## 2019-09-18 ENCOUNTER — Ambulatory Visit (INDEPENDENT_AMBULATORY_CARE_PROVIDER_SITE_OTHER): Payer: Medicare HMO | Admitting: Vascular Surgery

## 2019-09-18 ENCOUNTER — Other Ambulatory Visit: Payer: Self-pay

## 2019-09-18 ENCOUNTER — Encounter (INDEPENDENT_AMBULATORY_CARE_PROVIDER_SITE_OTHER): Payer: Self-pay | Admitting: Vascular Surgery

## 2019-09-18 VITALS — BP 116/77 | HR 76 | Resp 16 | Ht 60.0 in | Wt 141.8 lb

## 2019-09-18 DIAGNOSIS — E782 Mixed hyperlipidemia: Secondary | ICD-10-CM

## 2019-09-18 DIAGNOSIS — I8311 Varicose veins of right lower extremity with inflammation: Secondary | ICD-10-CM | POA: Diagnosis not present

## 2019-09-18 DIAGNOSIS — I639 Cerebral infarction, unspecified: Secondary | ICD-10-CM

## 2019-09-18 DIAGNOSIS — I4892 Unspecified atrial flutter: Secondary | ICD-10-CM | POA: Diagnosis not present

## 2019-09-18 DIAGNOSIS — I8312 Varicose veins of left lower extremity with inflammation: Secondary | ICD-10-CM

## 2019-09-18 NOTE — Progress Notes (Signed)
MRN : [id number]  [name] [name] is a 64 y.o. (09/03/1955) female who presents with chief complaint of  Chief Complaint  Patient presents with  . New Patient (Initial Visit)    ref [place] edema  .  History of Present Illness:   The patient is seen for evaluation of varicose veins. The patient relates some tiredness of the legs, particularly with standing. The patient also notes an aching and throbbing pain over the varicosities, particularly with prolonged dependent positions. The symptoms are significantly improved with elevation.  At this point, she doesn't feel the symptoms are severe enough that they're having a negative impact on lifestyle and are interfering with daily activities.  There is no history of DVT, PE or superficial thrombophlebitis. There is no history of ulceration or hemorrhage. The patient denies a significant family history of varicose veins.  The patient has not worn graduated compression in the past. At the present time the patient has not been using over-the-counter analgesics. There is no history of prior surgical intervention or sclerotherapy.    Current Meds  Medication Sig  . Ascorbic Acid (VITAMIN C) 100 MG tablet   . atorvastatin (LIPITOR) 20 MG tablet Place 1 tablet (20 mg total) into feeding tube daily at 6 PM.  . cetirizine (ZYRTEC) 10 MG tablet Take 10 mg by mouth daily.  Marland Kitchen ELIQUIS 2.5 MG TABS tablet Take 2.5 mg by mouth 2 (two) times daily.  . fluticasone (FLONASE) 50 MCG/ACT nasal spray Place into the nose.  . hydroxychloroquine (PLAQUENIL) 200 MG tablet Take 200 mg by mouth daily.   . Multiple Vitamins tablet   . vitamin B-12 (CYANOCOBALAMIN) 1000 MCG tablet Take 1,000 mcg by mouth daily.  . Vitamin D, Cholecalciferol, 10 MCG (400 UNIT) TABS     Past Medical History:  Diagnosis Date  . Adenopathy   . Allergic state   . Anemia   . Hyperlipidemia   . Hypothyroidism   . Osteopenia   . Raynaud disease   . Raynaud disease   . Scoliosis    . Seizures ([place])   . Shingles   . Sjogren's disease ([place])   . Spleen anomaly   . Stroke [facility]) 20/2017    Past Surgical History:  Procedure Laterality Date  . BREAST CYST ASPIRATION Right 1990's   neg  . COLONOSCOPY WITH PROPOFOL N/A 05/30/2017   Procedure: COLONOSCOPY WITH PROPOFOL;  Surgeon: Toledo, Benay Pike, MD;  Location: [facility] ENDOSCOPY;  Service: Gastroenterology;  Laterality: N/A;  . FOOT SURGERY Right   . TONSILLECTOMY    . vocal cord surgery  1992   nodules    Social History Social History   Tobacco Use  . Smoking status: Never Smoker  . Smokeless tobacco: Never Used  Substance Use Topics  . Alcohol use: No  . Drug use: No    Family History Family History  Problem Relation Age of Onset  . Cancer Mother        pancreatic  . Dementia Father   . Diabetes Sister        type 1  . Diabetes Brother        type 1  . Breast cancer Maternal Grandmother        breast  No family history of bleeding/clotting disorders, porphyria or autoimmune disease   Allergies  Allergen Reactions  . Erythromycin Ethylsuccinate Hives and Other (See Comments)  . Keflex [Cephalexin] Hives and Other (See Comments)  . Latex   . Penicillins Other (See Comments)  .  Sulfa Antibiotics Other (See Comments)     REVIEW OF SYSTEMS (Negative unless checked)  Constitutional: [] Weight loss  [] Fever  [] Chills Cardiac: [] Chest pain   [] Chest pressure   [] Palpitations   [] Shortness of breath when laying flat   [] Shortness of breath with exertion. Vascular:  [] Pain in legs with walking   [] Pain in legs at rest  [] History of DVT   [] Phlebitis   [] Swelling in legs   [x] Varicose veins   [] Non-healing ulcers Pulmonary:   [] Uses home oxygen   [] Productive cough   [] Hemoptysis   [] Wheeze  [] COPD   [] Asthma Neurologic:  [] Dizziness   [] Seizures   [] History of stroke   [] History of TIA  [] Aphasia   [] Vissual changes   [] Weakness or numbness in arm   [] Weakness or numbness in leg Musculoskeletal:    [] Joint swelling   [x] Joint pain   [] Low back pain Hematologic:  [] Easy bruising  [] Easy bleeding   [] Hypercoagulable state   [] Anemic Gastrointestinal:  [] Diarrhea   [] Vomiting  [] Gastroesophageal reflux/heartburn   [] Difficulty swallowing. Genitourinary:  [] Chronic kidney disease   [] Difficult urination  [] Frequent urination   [] Blood in urine Skin:  [] Rashes   [] Ulcers  Psychological:  [] History of anxiety   []  History of major depression.  Physical Examination  Vitals:   09/18/19 0940  BP: 116/77  Pulse: 76  Resp: 16  Weight: 141 lb 12.8 oz (64.3 kg)  Height: 5' (1.524 m)   Body mass index is 27.69 kg/m. Gen: WD/WN, NAD Head: [place]/AT, No temporalis wasting.  Ear/Nose/Throat: Hearing grossly intact, nares w/o erythema or drainage, poor dentition Eyes: PER, EOMI, sclera nonicteric.  Neck: Supple, no masses.  No bruit or JVD.  Pulmonary:  Good air movement, clear to auscultation bilaterally, no use of accessory muscles.  Cardiac: RRR, normal S1, S2, no Murmurs. Vascular: scattered varicosities present bilaterally.  Mild venous stasis changes to the legs bilaterally.  2+ soft pitting edema Vessel Right Left  Radial Palpable Palpable  PT Palpable Palpable  DP Palpable Palpable  Gatrointestinal: soft, non-distended. No guarding/no peritoneal signs.  Musculoskeletal: M/S 5/5 throughout.  No deformity or atrophy.  Neurologic: CN 2-12 intact. Pain and light touch intact in extremities.  Symmetrical.  Speech is fluent. Motor exam as listed above. Psychiatric: Judgment intact, Mood & affect appropriate for pt's clinical situation. Dermatologic: No rashes or ulcers noted.  No changes consistent with cellulitis. Lymph : No Cervical lymphadenopathy, no lichenification or skin changes of chronic lymphedema.  CBC Lab Results  Component Value Date   WBC 9.9 06/24/2015   HGB 9.3 (L) 06/24/2015   HCT 29.6 (L) 06/24/2015   MCV 96.1 06/24/2015   PLT 633 (H) 06/24/2015    BMET      Component Value Date/Time   NA 135 06/25/2015 1340   K 4.0 06/25/2015 1340   CL 101 06/25/2015 1340   CO2 24 06/25/2015 1340   GLUCOSE 178 (H) 06/25/2015 1340   BUN 14 06/25/2015 1340   CREATININE 0.60 09/28/2015 1508   CALCIUM 9.2 06/25/2015 1340   GFRNONAA >60 06/30/2015 0345   GFRAA >60 06/30/2015 0345   CrCl cannot be calculated (Patient's most recent lab result is older than the maximum 21 days allowed.).  COAG Lab Results  Component Value Date   INR 1.13 06/14/2015   INR 1.50 (H) 06/01/2015   INR 1.45 05/30/2015    Radiology No results found.   Assessment/Plan 1. Varicose veins of both lower extremities with inflammation Recommend:  The patient  is complaining of varicose veins.    I have had a long discussion with the patient regarding  varicose veins and why they cause symptoms.  Patient will begin wearing graduated compression stockings on a daily basis, beginning first thing in the morning and removing them in the evening. The patient is instructed specifically not to sleep in the stockings.    The patient  will also begin using over-the-counter analgesics such as Motrin 600 mg po TID to help control the symptoms as needed.    In addition, behavioral modification including elevation during the day will be initiated, utilizing a recliner was recommended.  The patient is also instructed to continue exercising such as walking 4-5 times per week.  At this time the patient wishes to continue conservative therapy and is not interested in more invasive treatments such as laser ablation and sclerotherapy.  The Patient will follow up PRN if the symptoms worsen.  2. Atrial flutter, unspecified type ([place]) Continue antiarrhythmia medications as already ordered, these medications have been reviewed and there are no changes at this time.  Continue anticoagulation as ordered by Cardiology Service   3. Mixed hyperlipidemia Continue statin as ordered and reviewed, no  changes at this time   4. Ischemic stroke of frontal lobe ([place]) Continue Eliquis and antiplatelet therapy as ordered no changes   Hortencia Pilar, MD  09/18/2019 10:07 AM

## 2019-12-16 ENCOUNTER — Ambulatory Visit: Payer: Medicare HMO | Admitting: Dermatology

## 2020-03-01 ENCOUNTER — Other Ambulatory Visit: Payer: Self-pay

## 2020-03-01 ENCOUNTER — Ambulatory Visit: Payer: Medicare HMO | Admitting: Dermatology

## 2020-03-01 DIAGNOSIS — L814 Other melanin hyperpigmentation: Secondary | ICD-10-CM

## 2020-03-01 DIAGNOSIS — Z1283 Encounter for screening for malignant neoplasm of skin: Secondary | ICD-10-CM

## 2020-03-01 DIAGNOSIS — W57XXXA Bitten or stung by nonvenomous insect and other nonvenomous arthropods, initial encounter: Secondary | ICD-10-CM | POA: Diagnosis not present

## 2020-03-01 DIAGNOSIS — L720 Epidermal cyst: Secondary | ICD-10-CM | POA: Diagnosis not present

## 2020-03-01 DIAGNOSIS — D229 Melanocytic nevi, unspecified: Secondary | ICD-10-CM

## 2020-03-01 DIAGNOSIS — L603 Nail dystrophy: Secondary | ICD-10-CM

## 2020-03-01 DIAGNOSIS — S2096XA Insect bite (nonvenomous) of unspecified parts of thorax, initial encounter: Secondary | ICD-10-CM | POA: Diagnosis not present

## 2020-03-01 DIAGNOSIS — I781 Nevus, non-neoplastic: Secondary | ICD-10-CM

## 2020-03-01 DIAGNOSIS — L821 Other seborrheic keratosis: Secondary | ICD-10-CM

## 2020-03-01 DIAGNOSIS — L719 Rosacea, unspecified: Secondary | ICD-10-CM

## 2020-03-01 NOTE — Progress Notes (Signed)
   New Patient Visit  Subjective  [name] [name] is a 64 y.o. female who presents for the following: Annual Exam (total body skin exam,) and check spots (glabella, lower lip and R chest). They are bothersome.   The following portions of the chart were reviewed this encounter and updated as appropriate:      Review of Systems:  No other skin or systemic complaints except as noted in HPI or Assessment and Plan.  Objective  Well appearing patient in no apparent distress; mood and affect are within normal limits.  A full examination was performed including scalp, head, eyes, ears, nose, lips, neck, chest, axillae, abdomen, back, buttocks, bilateral upper extremities, bilateral lower extremities, hands, feet, fingers, toes, fingernails, and toenails. All findings within normal limits unless otherwise noted below.  Objective  nasal root, R lower lip at vermillion: Smooth white papule(s).   Objective  Right chest: Pink edematous pap  Objective  bil legs: Varicies bil legs  Objective  face: Telangiectasias cheeks  Objective  R 2nd toenail, L 3rd and 4th fingernail: Toenail dystrophy with thickening R 2nd toenail Periungual onycholysis L 3rd and 4th finger   Assessment & Plan    Seborrheic Keratoses - Stuck-on, waxy, tan-brown papules and plaques  - Discussed benign etiology and prognosis. - Observe - Call for any changes - flank, abdomen  Skin cancer screening performed today.  Milia nasal root, R lower lip at vermillion  Benign, irritated  Discussed extraction Extraction x 3 today  Acne/Milia surgery - nasal root, R lower lip at vermillion Procedure risks and benefits were discussed with the patient and verbal consent was obtained. Following prep of the skin on the glabella and R lower lip x 2 with an alcohol swab, extraction of comedones was performed with a comedone extractor. Vaseline ointment was applied to each site. The patient tolerated the procedure  well.  Bug bite without infection, initial encounter Right chest  Start Pandel cream qd/bid, sample x 1 Lot [id number] 02/22  Spider veins bil legs  And Varicose veins  Benign, observe   Rosacea face  Benign, discussed BBL  Recommend daily broad spectrum sunscreen SPF 30+ to face  Rosacea is a chronic progressive skin condition usually affecting the face of adults. It is treatable but not curable. It sometimes affects the eyes (ocular rosacea) as well. It may respond to topical and/or systemic medication and can flare with stress, sun exposure, alcohol, exercise and some foods.   Nail dystrophy R 2nd toenail, L 3rd and 4th fingernail  2ndary to trauma (trauma from shoe for toenail and pt slammed L hand in door yrs ago)  Benign, observe   Lentigines - Scattered tan macules - Discussed due to sun exposure - Benign, observe - Call for any changes  Melanocytic Nevi - Tan-brown and/or pink-flesh-colored symmetric macules and papules - Benign appearing on exam today - Observation - Call clinic for new or changing moles - Recommend daily use of broad spectrum spf 30+ sunscreen to sun-exposed areas.     Return in about 1 year (around 03/01/2021) for TBSE.  I, Othelia Pulling, RMA, am acting as scribe for Brendolyn Patty, MD . Documentation: I have reviewed the above documentation for accuracy and completeness, and I agree with the above.  Brendolyn Patty MD

## 2020-05-25 ENCOUNTER — Other Ambulatory Visit: Payer: Self-pay | Admitting: Obstetrics and Gynecology

## 2020-05-25 DIAGNOSIS — Z1231 Encounter for screening mammogram for malignant neoplasm of breast: Secondary | ICD-10-CM

## 2020-08-05 ENCOUNTER — Other Ambulatory Visit: Payer: Self-pay

## 2020-08-05 ENCOUNTER — Ambulatory Visit
Admission: RE | Admit: 2020-08-05 | Discharge: 2020-08-05 | Disposition: A | Discharge status: Home / Self Care | Payer: Medicare HMO | Source: Ambulatory Visit | Attending: Obstetrics and Gynecology | Admitting: Obstetrics and Gynecology

## 2020-08-05 DIAGNOSIS — Z1231 Encounter for screening mammogram for malignant neoplasm of breast: Secondary | ICD-10-CM | POA: Insufficient documentation

## 2021-03-08 ENCOUNTER — Other Ambulatory Visit: Payer: Self-pay

## 2021-03-08 ENCOUNTER — Ambulatory Visit: Payer: Medicare HMO | Admitting: Dermatology

## 2021-03-08 DIAGNOSIS — D229 Melanocytic nevi, unspecified: Secondary | ICD-10-CM

## 2021-03-08 DIAGNOSIS — L719 Rosacea, unspecified: Secondary | ICD-10-CM

## 2021-03-08 DIAGNOSIS — L988 Other specified disorders of the skin and subcutaneous tissue: Secondary | ICD-10-CM

## 2021-03-08 DIAGNOSIS — D235 Other benign neoplasm of skin of trunk: Secondary | ICD-10-CM | POA: Diagnosis not present

## 2021-03-08 DIAGNOSIS — L578 Other skin changes due to chronic exposure to nonionizing radiation: Secondary | ICD-10-CM

## 2021-03-08 DIAGNOSIS — Z1283 Encounter for screening for malignant neoplasm of skin: Secondary | ICD-10-CM

## 2021-03-08 DIAGNOSIS — I8393 Asymptomatic varicose veins of bilateral lower extremities: Secondary | ICD-10-CM

## 2021-03-08 DIAGNOSIS — D239 Other benign neoplasm of skin, unspecified: Secondary | ICD-10-CM

## 2021-03-08 DIAGNOSIS — L738 Other specified follicular disorders: Secondary | ICD-10-CM

## 2021-03-08 DIAGNOSIS — D1800 Hemangioma unspecified site: Secondary | ICD-10-CM

## 2021-03-08 DIAGNOSIS — L821 Other seborrheic keratosis: Secondary | ICD-10-CM

## 2021-03-08 DIAGNOSIS — L814 Other melanin hyperpigmentation: Secondary | ICD-10-CM

## 2021-03-08 NOTE — Patient Instructions (Signed)
Melanoma ABCDEs ? ?Melanoma is the most dangerous type of skin cancer, and is the leading cause of death from skin disease.  You are more likely to develop melanoma if you: ?Have light-colored skin, light-colored eyes, or red or blond hair ?Spend a lot of time in the sun ?Tan regularly, either outdoors or in a tanning bed ?Have had blistering sunburns, especially during childhood ?Have a close family member who has had a melanoma ?Have atypical moles or large birthmarks ? ?Early detection of melanoma is key since treatment is typically straightforward and cure rates are extremely high if we catch it early.  ? ?The first sign of melanoma is often a change in a mole or a new dark spot.  The ABCDE system is a way of remembering the signs of melanoma. ? ?A for asymmetry:  The two halves do not match. ?B for border:  The edges of the growth are irregular. ?C for color:  A mixture of colors are present instead of an even brown color. ?D for diameter:  Melanomas are usually (but not always) greater than 6mm - the size of a pencil eraser. ?E for evolution:  The spot keeps changing in size, shape, and color. ? ?Please check your skin once per month between visits. You can use a small mirror in front and a large mirror behind you to keep an eye on the back side or your body.  ? ?If you see any new or changing lesions before your next follow-up, please call to schedule a visit. ? ?Please continue daily skin protection including broad spectrum sunscreen SPF 30+ to sun-exposed areas, reapplying every 2 hours as needed when you're outdoors.  ? ?Staying in the shade or wearing long sleeves, sun glasses (UVA+UVB protection) and wide brim hats (4-inch brim around the entire circumference of the hat) are also recommended for sun protection.   ? ? ?If You Need Anything After Your Visit ? ?If you have any questions or concerns for your doctor, please call our main line at [phone] and press option 4 to reach your doctor's medical  assistant. If no one answers, please leave a voicemail as directed and we will return your call as soon as possible. Messages left after 4 pm will be answered the following business day.  ? ?You may also send us a message via MyChart. We typically respond to MyChart messages within 1-2 business days. ? ?For prescription refills, please ask your pharmacy to contact our office. Our fax number is [phone]. ? ?If you have an urgent issue when the clinic is closed that cannot wait until the next business day, you can page your doctor at the number below.   ? ?Please note that while we do our best to be available for urgent issues outside of office hours, we are not available 24/7.  ? ?If you have an urgent issue and are unable to reach us, you may choose to seek medical care at your doctor's office, retail clinic, urgent care center, or emergency room. ? ?If you have a medical emergency, please immediately call 911 or go to the emergency department. ? ?Pager Numbers ? ?- Dr. Kowalski: [phone] ? ?- Dr. Moye: [phone] ? ?- Dr. Stewart: [phone] ? ?In the event of inclement weather, please call our main line at [phone] for an update on the status of any delays or closures. ? ?Dermatology Medication Tips: ?Please keep the boxes that topical medications come in in order to help keep track of the instructions   about where and how to use these. Pharmacies typically print the medication instructions only on the boxes and not directly on the medication tubes.  ? ?If your medication is too expensive, please contact our office at [phone] option 4 or send us a message through MyChart.  ? ?We are unable to tell what your co-pay for medications will be in advance as this is different depending on your insurance coverage. However, we may be able to find a substitute medication at lower cost or fill out paperwork to get insurance to cover a needed medication.  ? ?If a prior authorization is required to get your  medication covered by your insurance company, please allow us 1-2 business days to complete this process. ? ?Drug prices often vary depending on where the prescription is filled and some pharmacies may offer cheaper prices. ? ?The website www.goodrx.com contains coupons for medications through different pharmacies. The prices here do not account for what the cost may be with help from insurance (it may be cheaper with your insurance), but the website can give you the price if you did not use any insurance.  ?- You can print the associated coupon and take it with your prescription to the pharmacy.  ?- You may also stop by our office during regular business hours and pick up a GoodRx coupon card.  ?- If you need your prescription sent electronically to a different pharmacy, notify our office through [facility] MyChart or by phone at [phone] option 4. ? ? ? ? ?Si Usted Necesita Algo Despu?s de Su Visita ? ?Tambi?n puede enviarnos un mensaje a trav?s de MyChart. Por lo general respondemos a los mensajes de MyChart en el transcurso de 1 a 2 d?as h?biles. ? ?Para renovar recetas, por favor pida a su farmacia que se ponga en contacto con nuestra oficina. Nuestro n?mero de fax es el [phone]. ? ?Si tiene un asunto urgente cuando la cl?nica est? cerrada y que no puede esperar hasta el siguiente d?a h?bil, puede llamar/localizar a su doctor(a) al n?mero que aparece a continuaci?n.  ? ?Por favor, tenga en cuenta que aunque hacemos todo lo posible para estar disponibles para asuntos urgentes fuera del horario de oficina, no estamos disponibles las 24 horas del d?a, los 7 d?as de la semana.  ? ?Si tiene un problema urgente y no puede comunicarse con nosotros, puede optar por buscar atenci?n m?dica  en el consultorio de su doctor(a), en una cl?nica privada, en un centro de atenci?n urgente o en una sala de emergencias. ? ?Si tiene una emergencia m?dica, por favor llame inmediatamente al 911 o vaya a la sala de  emergencias. ? ?N?meros de b?per ? ?- Dr. Kowalski: [phone] ? ?- Dra. Moye: [phone] ? ?- Dra. Stewart: [phone] ? ?En caso de inclemencias del tiempo, por favor llame a nuestra l?nea principal al [phone] para una actualizaci?n sobre el estado de cualquier retraso o cierre. ? ?Consejos para la medicaci?n en dermatolog?a: ?Por favor, guarde las cajas en las que vienen los medicamentos de uso t?pico para ayudarle a seguir las instrucciones sobre d?nde y c?mo usarlos. Las farmacias generalmente imprimen las instrucciones del medicamento s?lo en las cajas y no directamente en los tubos del medicamento.  ? ?Si su medicamento es muy caro, por favor, p?ngase en contacto con nuestra oficina llamando al [phone] y presione la opci?n 4 o env?enos un mensaje a trav?s de MyChart.  ? ?No podemos decirle cu?l ser? su copago por los medicamentos por adelantado ya que   esto es diferente dependiendo de la cobertura de su seguro. Sin embargo, es posible que podamos encontrar un medicamento sustituto a menor costo o llenar un formulario para que el seguro cubra el medicamento que se considera necesario.  ? ?Si se requiere una autorizaci?n previa para que su compa??a de seguros cubra su medicamento, por favor perm?tanos de 1 a 2 d?as h?biles para completar este proceso. ? ?Los precios de los medicamentos var?an con frecuencia dependiendo del lugar de d?nde se surte la receta y alguna farmacias pueden ofrecer precios m?s baratos. ? ?El sitio web www.goodrx.com tiene cupones para medicamentos de diferentes farmacias. Los precios aqu? no tienen en cuenta lo que podr?a costar con la ayuda del seguro (puede ser m?s barato con su seguro), pero el sitio web puede darle el precio si no utiliz? ning?n seguro.  ?- Puede imprimir el cup?n correspondiente y llevarlo con su receta a la farmacia.  ?- Tambi?n puede pasar por nuestra oficina durante el horario de atenci?n regular y recoger una tarjeta de cupones de GoodRx.  ?- Si  necesita que su receta se env?e electr?nicamente a una farmacia diferente, informe a nuestra oficina a trav?s de MyChart de [facility] o por tel?fono llamando al [phone] y presione la opci?n 4. ? ?

## 2021-03-08 NOTE — Progress Notes (Signed)
Follow-Up Visit   Subjective  [name] [name] is a 65 y.o. female who presents for the following: Follow-up (Patient here today for tbse. Patient would like to discuss wrinkle at forehead. Patient also would like to discuss spot at right cheek and under chin . Patient denies other concerns. ).  The following portions of the chart were reviewed this encounter and updated as appropriate:      Review of Systems: No other skin or systemic complaints except as noted in HPI or Assessment and Plan.   Objective  Well appearing patient in no apparent distress; mood and affect are within normal limits.  A full examination was performed including scalp, head, eyes, ears, nose, lips, neck, chest, axillae, abdomen, back, buttocks, bilateral upper extremities, bilateral lower extremities, hands, feet, fingers, toes, fingernails, and toenails. All findings within normal limits unless otherwise noted below.  mid face and nose Mild erythema at mid face with telangiectasias   Glabella and nasal root Rhytides and volume loss.   back Large open comedone   Assessment & Plan  Rosacea mid face and nose  Mild Rosacea is a chronic progressive skin condition usually affecting the face of adults, causing redness and/or acne bumps. It is treatable but not curable. It sometimes affects the eyes (ocular rosacea) as well. It may respond to topical and/or systemic medication and can flare with stress, sun exposure, alcohol, exercise and some foods.  Daily application of broad spectrum spf 30+ sunscreen to face is recommended to reduce flares.  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   Lentigines - Scattered tan macules at  left upper lip [place] edge  - Due to sun exposure - Benign-appearing, observe - Recommend daily broad spectrum sunscreen SPF 30+ to sun-exposed areas, reapply every 2 hours as needed. - Call for any changes  Varicose Veins/Spider Veins - Dilated blue, purple or red veins at the lower extremities - Reassured - Smaller vessels can be treated by sclerotherapy (a procedure to inject a medicine into the veins to make them disappear) if desired, but the treatment is not covered by insurance. Larger vessels may be covered if symptomatic and we would refer to vascular surgeon if treatment desired.  Sebaceous Hyperplasia - 3 mm small yellow papule at right mid cheek - Benign-appearing - Observe for changes  Seborrheic Keratoses - Stuck-on, waxy, tan-brown papules, tiny papule at inferior chin  - Benign-appearing, not bothersome - Discussed benign etiology and prognosis. - Observe - Call for any changes  Melanocytic Nevi - Tan-brown and/or pink-flesh-colored symmetric macules and papules - Benign appearing on exam today - Observation - Call clinic for new or changing moles - Recommend daily use of broad spectrum spf 30+ sunscreen to sun-exposed areas.   Hemangiomas - Red papules - Discussed benign nature - Observe - Call for any changes  Actinic Damage - Chronic condition, secondary to cumulative UV/sun exposure - diffuse scaly erythematous macules with underlying dyspigmentation - Recommend daily broad spectrum sunscreen SPF 30+ to sun-exposed areas, reapply every 2 hours as needed.  - Staying in the shade or wearing long sleeves, sun glasses (UVA+UVB protection) and wide brim hats (4-inch brim around  the entire circumference of the hat) are also recommended for sun protection.  - Call for new or changing lesions.  Skin cancer screening performed today.  Return for 1 year tbse . I, Ruthell Rummage, CMA, am acting as scribe for Brendolyn Patty, MD.  Documentation: I have  reviewed the above documentation for accuracy and completeness, and I agree with the above.  Brendolyn Patty MD

## 2021-05-26 ENCOUNTER — Other Ambulatory Visit: Payer: Self-pay | Admitting: Obstetrics and Gynecology

## 2021-05-26 DIAGNOSIS — Z1231 Encounter for screening mammogram for malignant neoplasm of breast: Secondary | ICD-10-CM

## 2021-08-09 ENCOUNTER — Ambulatory Visit
Admission: RE | Admit: 2021-08-09 | Discharge: 2021-08-09 | Disposition: A | Discharge status: Home / Self Care | Payer: Medicare HMO | Source: Ambulatory Visit | Attending: Obstetrics and Gynecology | Admitting: Obstetrics and Gynecology

## 2021-08-09 DIAGNOSIS — Z1231 Encounter for screening mammogram for malignant neoplasm of breast: Secondary | ICD-10-CM | POA: Insufficient documentation

## 2021-11-16 ENCOUNTER — Encounter: Payer: Self-pay | Admitting: Ophthalmology

## 2021-11-22 NOTE — Discharge Instructions (Signed)

## 2021-11-23 ENCOUNTER — Ambulatory Visit: Payer: Medicare HMO | Admitting: Anesthesiology

## 2021-11-23 ENCOUNTER — Ambulatory Visit (AMBULATORY_SURGERY_CENTER): Payer: Medicare HMO | Admitting: Anesthesiology

## 2021-11-23 ENCOUNTER — Encounter
Admission: RE | Disposition: A | Discharge status: Home / Self Care | Payer: Self-pay | Source: Home / Self Care | Attending: Ophthalmology

## 2021-11-23 ENCOUNTER — Other Ambulatory Visit: Payer: Self-pay

## 2021-11-23 ENCOUNTER — Ambulatory Visit
Admission: RE | Admit: 2021-11-23 | Discharge: 2021-11-23 | Disposition: A | Discharge status: Home / Self Care | Payer: Medicare HMO | Attending: Ophthalmology | Admitting: Ophthalmology

## 2021-11-23 ENCOUNTER — Encounter: Payer: Self-pay | Admitting: Ophthalmology

## 2021-11-23 DIAGNOSIS — E039 Hypothyroidism, unspecified: Secondary | ICD-10-CM | POA: Diagnosis not present

## 2021-11-23 DIAGNOSIS — E785 Hyperlipidemia, unspecified: Secondary | ICD-10-CM | POA: Diagnosis not present

## 2021-11-23 DIAGNOSIS — I739 Peripheral vascular disease, unspecified: Secondary | ICD-10-CM | POA: Insufficient documentation

## 2021-11-23 DIAGNOSIS — H2512 Age-related nuclear cataract, left eye: Secondary | ICD-10-CM | POA: Diagnosis present

## 2021-11-23 DIAGNOSIS — I4891 Unspecified atrial fibrillation: Secondary | ICD-10-CM | POA: Diagnosis not present

## 2021-11-23 DIAGNOSIS — D638 Anemia in other chronic diseases classified elsewhere: Secondary | ICD-10-CM | POA: Diagnosis not present

## 2021-11-23 DIAGNOSIS — I69351 Hemiplegia and hemiparesis following cerebral infarction affecting right dominant side: Secondary | ICD-10-CM | POA: Diagnosis not present

## 2021-11-23 DIAGNOSIS — D649 Anemia, unspecified: Secondary | ICD-10-CM | POA: Diagnosis not present

## 2021-11-23 DIAGNOSIS — M3500 Sicca syndrome, unspecified: Secondary | ICD-10-CM | POA: Diagnosis not present

## 2021-11-23 HISTORY — DX: Weakness: R53.1

## 2021-11-23 HISTORY — DX: Other complications of anesthesia, initial encounter: T88.59XA

## 2021-11-23 HISTORY — PX: CATARACT EXTRACTION W/PHACO: SHX586

## 2021-11-23 SURGERY — PHACOEMULSIFICATION, CATARACT, WITH IOL INSERTION
Anesthesia: Monitor Anesthesia Care | Site: Eye | Laterality: Left

## 2021-11-23 MED ORDER — SIGHTPATH DOSE#1 NA HYALUR & NA CHOND-NA HYALUR IO KIT
PACK | INTRAOCULAR | Status: DC | PRN
  Administered 2021-11-23: 1 via OPHTHALMIC

## 2021-11-23 MED ORDER — SIGHTPATH DOSE#1 BSS IO SOLN
INTRAOCULAR | Status: DC | PRN
  Administered 2021-11-23: 67 mL via OPHTHALMIC

## 2021-11-23 MED ORDER — ACETAMINOPHEN 325 MG PO TABS
650.0000 mg | ORAL_TABLET | Freq: Four times a day (QID) | ORAL | Status: DC | PRN
  Administered 2021-11-23: 650 mg via ORAL

## 2021-11-23 MED ORDER — ARMC OPHTHALMIC DILATING DROPS
1.0000 | OPHTHALMIC | Status: DC | PRN
  Administered 2021-11-23 (×3): 1 via OPHTHALMIC

## 2021-11-23 MED ORDER — TETRACAINE HCL 0.5 % OP SOLN
1.0000 [drp] | OPHTHALMIC | Status: DC | PRN
  Administered 2021-11-23 (×3): 1 [drp] via OPHTHALMIC

## 2021-11-23 MED ORDER — SIGHTPATH DOSE#1 BSS IO SOLN
INTRAOCULAR | Status: DC | PRN
  Administered 2021-11-23: 1 mL

## 2021-11-23 MED ORDER — MOXIFLOXACIN HCL 0.5 % OP SOLN
OPHTHALMIC | Status: DC | PRN
  Administered 2021-11-23: 0.2 mL via OPHTHALMIC

## 2021-11-23 MED ORDER — SIGHTPATH DOSE#1 BSS IO SOLN
INTRAOCULAR | Status: DC | PRN
  Administered 2021-11-23: 15 mL

## 2021-11-23 MED ORDER — MIDAZOLAM HCL 2 MG/2ML IJ SOLN
INTRAMUSCULAR | Status: DC | PRN
  Administered 2021-11-23: 1 mg via INTRAVENOUS

## 2021-11-23 MED ORDER — BRIMONIDINE TARTRATE-TIMOLOL 0.2-0.5 % OP SOLN
OPHTHALMIC | Status: DC | PRN
  Administered 2021-11-23: 1 [drp] via OPHTHALMIC

## 2021-11-23 MED ORDER — FENTANYL CITRATE (PF) 100 MCG/2ML IJ SOLN
INTRAMUSCULAR | Status: DC | PRN
  Administered 2021-11-23: 50 ug via INTRAVENOUS

## 2021-11-23 SURGICAL SUPPLY — 21 items
CANNULA ANT/CHMB 27G (MISCELLANEOUS) IMPLANT
CANNULA ANT/CHMB 27GA (MISCELLANEOUS) IMPLANT
CATARACT SUITE SIGHTPATH (MISCELLANEOUS) ×2 IMPLANT
FEE CATARACT SUITE SIGHTPATH (MISCELLANEOUS) ×1 IMPLANT
GLOVE SRG 8 PF TXTR STRL LF DI (GLOVE) ×1 IMPLANT
GLOVE SURG ENC TEXT LTX SZ7.5 (GLOVE) ×2 IMPLANT
GLOVE SURG GAMMEX PI TX LF 7.5 (GLOVE) IMPLANT
GLOVE SURG UNDER POLY LF SZ8 (GLOVE) ×2
LENS IOL TECNIS EYHANCE 20.5 (Intraocular Lens) ×1 IMPLANT
NDL FILTER BLUNT 18X1 1/2 (NEEDLE) ×1 IMPLANT
NDL RETROBULBAR .5 NSTRL (NEEDLE) IMPLANT
NEEDLE FILTER BLUNT 18X 1/2SAF (NEEDLE) ×1
NEEDLE FILTER BLUNT 18X1 1/2 (NEEDLE) ×1 IMPLANT
PACK VIT ANT 23G (MISCELLANEOUS) IMPLANT
RING MALYGIN 7.0 (MISCELLANEOUS) IMPLANT
SUT ETHILON 10-0 CS-B-6CS-B-6 (SUTURE)
SUT VICRYL  9 0 (SUTURE)
SUT VICRYL 9 0 (SUTURE) IMPLANT
SUTURE EHLN 10-0 CS-B-6CS-B-6 (SUTURE) IMPLANT
SYR 3ML LL SCALE MARK (SYRINGE) ×2 IMPLANT
WATER STERILE IRR 250ML POUR (IV SOLUTION) ×2 IMPLANT

## 2021-11-23 NOTE — Transfer of Care (Signed)
Immediate Anesthesia Transfer of Care Note  Patient: [name] [name]  Procedure(s) Performed: CATARACT EXTRACTION PHACO AND INTRAOCULAR LENS PLACEMENT (IOC) LEFT (Left: Eye)  Patient Location: PACU  Anesthesia Type: MAC  Level of Consciousness: awake, alert  and patient cooperative  Airway and Oxygen Therapy: Patient Spontanous Breathing and Patient connected to supplemental oxygen  Post-op Assessment: Post-op Vital signs reviewed, Patient's Cardiovascular Status Stable, Respiratory Function Stable, Patent Airway and No signs of Nausea or vomiting  Post-op Vital Signs: Reviewed and stable  Complications: No notable events documented.

## 2021-11-23 NOTE — Op Note (Signed)
  OPERATIVE NOTE  [name] [name] [id number] 11/23/2021   PREOPERATIVE DIAGNOSIS:  Nuclear sclerotic cataract left eye. H25.12   POSTOPERATIVE DIAGNOSIS:    Nuclear sclerotic cataract left eye.     PROCEDURE:  Phacoemusification with posterior chamber intraocular lens placement of the left eye  Ultrasound time: Procedure(s) with comments: CATARACT EXTRACTION PHACO AND INTRAOCULAR LENS PLACEMENT (IOC) LEFT (Left) - 4.72 0:53.0  LENS:   Implant Name Type Inv. Item Serial No. Manufacturer Lot No. LRB No. Used Action  LENS IOL TECNIS EYHANCE 20.5 - [id number] Intraocular Lens LENS IOL TECNIS EYHANCE 20.5 [id number] [employer]  Left 1 Implanted      SURGEON:  Wyonia Hough, MD   ANESTHESIA:  Topical with tetracaine drops and 2% Xylocaine jelly, augmented with 1% preservative-free intracameral lidocaine.    COMPLICATIONS:  None.   DESCRIPTION OF PROCEDURE:  The patient was identified in the holding room and transported to the operating room and placed in the supine position under the operating microscope.  The left eye was identified as the operative eye and it was prepped and draped in the usual sterile ophthalmic fashion.   A 1 millimeter clear-corneal paracentesis was made at the 1:30 position.  0.5 ml of preservative-free 1% lidocaine was injected into the anterior chamber.  The anterior chamber was filled with Viscoat viscoelastic.  A 2.4 millimeter keratome was used to make a near-clear corneal incision at the 10:30 position.  .  A curvilinear capsulorrhexis was made with a cystotome and capsulorrhexis forceps.  Balanced salt solution was used to hydrodissect and hydrodelineate the nucleus.   Phacoemulsification was then used in stop and chop fashion to remove the lens nucleus and epinucleus.  The remaining cortex was then removed using the irrigation and aspiration handpiece. Provisc was then placed into the capsular bag to distend it for lens placement.  A lens was then injected  into the capsular bag.  The remaining viscoelastic was aspirated.   Wounds were hydrated with balanced salt solution.  The anterior chamber was inflated to a physiologic pressure with balanced salt solution.  No wound leaks were noted. Vigamox 0.2 ml of a '1mg'$  per ml solution was injected into the anterior chamber for a dose of 0.2 mg of intracameral antibiotic at the completion of the case.   Timolol and Brimonidine drops were applied to the eye.  The patient was taken to the recovery room in stable condition without complications of anesthesia or surgery.  [name] [name] 11/23/2021, 2:05 PM

## 2021-11-23 NOTE — Anesthesia Preprocedure Evaluation (Addendum)
Anesthesia Evaluation  Patient identified by MRN, date of birth, ID band Patient awake    Reviewed: Allergy & Precautions, NPO status , Patient's Chart, lab work & pertinent test results  History of Anesthesia Complications Negative for: history of anesthetic complications  Airway Mallampati: III  TM Distance: >3 FB Neck ROM: full    Dental no notable dental hx.    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), Residual Symptoms negative psych ROS   GI/Hepatic negative GI ROS, Neg liver ROS,   Endo/Other  Hypothyroidism   Renal/GU      Musculoskeletal  (+) Arthritis ,   Abdominal Normal abdominal exam  (+)   Peds  Hematology negative hematology ROS (+)   Anesthesia Other Findings Past Medical History: No date: Adenopathy No date: Allergic state No date: Anemia No date: Complication of anesthesia     Comment:  slow to wake No date: Hyperlipidemia No date: Hypothyroidism No date: Osteopenia No date: Raynaud disease No date: Raynaud disease No date: Right sided weakness     Comment:  mild, since stroke (2017) No date: Scoliosis No date: Seizures ([place])     Comment:  during high school years No date: Shingles No date: Sjogren's disease ([place]) 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 20/2017: Stroke [facility])  Past Surgical History: 1990's: BREAST CYST ASPIRATION; Right     Comment:  neg 05/30/2017: COLONOSCOPY WITH PROPOFOL; N/A     Comment:  Procedure: COLONOSCOPY WITH PROPOFOL;  Surgeon: [place],               Benay Pike, MD;  Location: [facility] ENDOSCOPY;  Service:               Gastroenterology;  Laterality: N/A; No date: FOOT SURGERY; Right No date: TONSILLECTOMY 1992: vocal cord surgery     Comment:   nodules  BMI    Body Mass Index: 27.73 kg/m      Reproductive/Obstetrics negative OB ROS                            Anesthesia Physical Anesthesia Plan  ASA: 3  Anesthesia Plan: MAC   Post-op Pain Management: Minimal or no pain anticipated   Induction: Intravenous  PONV Risk Score and Plan:   Airway Management Planned: Natural Airway and Nasal Cannula  Additional Equipment:   Intra-op Plan:   Post-operative Plan:   Informed Consent: I have reviewed the patients History and Physical, chart, labs and discussed the procedure including the risks, benefits and alternatives for the proposed anesthesia with the patient or authorized representative who has indicated his/her understanding and acceptance.     Dental advisory given  Plan Discussed with: Anesthesiologist, CRNA and Surgeon  Anesthesia Plan Comments:        Anesthesia Quick Evaluation

## 2021-11-23 NOTE — H&P (Signed)
[place]   Primary Care Physician:  Derinda Late, MD Ophthalmologist: Dr. Leandrew Koyanagi  Pre-Procedure History & Physical: HPI:  [name] [name] is a 66 y.o. female here for ophthalmic surgery.   Past Medical History:  Diagnosis Date   Adenopathy    Allergic state    Anemia    Complication of anesthesia    slow to wake   Hyperlipidemia    Hypothyroidism    Osteopenia    Raynaud disease    Raynaud disease    Right sided weakness    mild, since stroke (2017)   Scoliosis    Seizures ([place])    during high school years   Shingles    Sjogren's disease ([place])    Spleen anomaly    Stroke ([place]) 20/2017    Past Surgical History:  Procedure Laterality Date   BREAST CYST ASPIRATION Right 1990's   neg   COLONOSCOPY WITH PROPOFOL N/A 05/30/2017   Procedure: COLONOSCOPY WITH PROPOFOL;  Surgeon: Toledo, Benay Pike, MD;  Location: [facility] ENDOSCOPY;  Service: Gastroenterology;  Laterality: N/A;   FOOT SURGERY Right    TONSILLECTOMY     vocal cord surgery  1992   nodules    Prior to Admission medications   Medication Sig Start Date End Date Taking? Authorizing Provider  Ascorbic Acid (VITAMIN C) 100 MG tablet  05/29/19  Yes [provider]  atorvastatin (LIPITOR) 20 MG tablet Place 1 tablet (20 mg total) into feeding tube daily at 6 PM. 08/23/15  Yes Kirsteins, Luanna Salk, MD  CALCIUM PO Take by mouth daily.   Yes [provider]  cetirizine (ZYRTEC) 10 MG tablet Take 10 mg by mouth daily.   Yes [provider]  ELIQUIS 2.5 MG TABS tablet Take 2.5 mg by mouth 2 (two) times daily. 08/25/19  Yes [provider]  Ferrous Sulfate (SLOW FE PO) Take by mouth.   Yes [provider]  fluticasone (FLONASE) 50 MCG/ACT nasal spray Place into the nose.   Yes [provider]  hydroxychloroquine (PLAQUENIL) 200 MG tablet Take 200 mg by mouth daily.    Yes [provider]  ibandronate (BONIVA) 150 MG tablet Take 150 mg by mouth  every 30 (thirty) days. 06/16/20  Yes [provider]  levothyroxine (SYNTHROID) 88 MCG tablet Take 88 mcg by mouth daily before breakfast.   Yes [provider]  Multiple Vitamins tablet  05/29/19  Yes [provider]  vitamin B-12 (CYANOCOBALAMIN) 1000 MCG tablet Take 1,000 mcg by mouth daily.   Yes [provider]  Vitamin D, Cholecalciferol, 10 MCG (400 UNIT) TABS Take 40 mcg by mouth daily. 05/29/19  Yes [provider]  Zinc Sulfate (ZINC 15 PO) Take by mouth daily.   Yes [provider]    Allergies as of 10/19/2021 - Review Complete 03/08/2021  Allergen Reaction Noted   Erythromycin ethylsuccinate Hives and Other (See Comments) 11/27/2014   Keflex [cephalexin] Hives and Other (See Comments) 11/27/2014   Latex  07/05/2015   Penicillins Other (See Comments) 11/27/2014   Sulfa antibiotics Other (See Comments) 11/27/2014    Family History  Problem Relation Age of Onset   Cancer Mother        pancreatic   Dementia Father    Diabetes Sister        type 1   Diabetes Brother        type 1   Breast cancer Maternal Grandmother        breast  Social History   Socioeconomic History   Marital status: Married    Spouse name: Coralyn Mark   Number of children: 3   Years of education: 18   Highest education level: Not on file  Occupational History    Comment: Oncologist  Tobacco Use   Smoking status: Never   Smokeless tobacco: Never  Vaping Use   Vaping Use: Never used  Substance and Sexual Activity   Alcohol use: No   Drug use: No   Sexual activity: Not on file  Other Topics Concern   Not on file  Social History Narrative   Lives with husband in home   Caffeine use- occas ice tea   Social Determinants of Health   Financial Resource Strain: Not on file  Food Insecurity: Not on file  Transportation Needs: Not on file  Physical Activity: Not on file  Stress: Not on file  Social Connections: Not on file   Intimate Partner Violence: Not on file    Review of Systems: See HPI, otherwise negative ROS  Physical Exam: BP 113/67   Pulse 76   Temp (!) 97.5 F (36.4 C) (Temporal)   Resp 18   Ht 5' (1.524 m)   Wt 65.8 kg   LMP  (LMP Unknown)   SpO2 99%   BMI 28.32 kg/m  General:   Alert,  pleasant and cooperative in NAD Head:  Normocephalic and atraumatic. Lungs:  Clear to auscultation.    Heart:  Regular rate and rhythm.   Impression/Plan: [name] [name] is here for ophthalmic surgery.  Risks, benefits, limitations, and alternatives regarding ophthalmic surgery have been reviewed with the patient.  Questions have been answered.  All parties agreeable.   Leandrew Koyanagi, MD  11/23/2021, 12:52 PM

## 2021-11-23 NOTE — Anesthesia Procedure Notes (Signed)
Procedure Name: MAC Date/Time: 11/23/2021 1:51 PM  Performed by: Jerrye Noble, CRNAPre-anesthesia Checklist: Patient identified, Emergency Drugs available, Suction available and Patient being monitored Patient Re-evaluated:Patient Re-evaluated prior to induction Oxygen Delivery Method: Nasal cannula

## 2021-11-23 NOTE — Anesthesia Postprocedure Evaluation (Signed)
Anesthesia Post Note  Patient: [name] [name]  Procedure(s) Performed: CATARACT EXTRACTION PHACO AND INTRAOCULAR LENS PLACEMENT (IOC) LEFT (Left: Eye)     Patient location during evaluation: PACU Anesthesia Type: MAC Level of consciousness: awake and alert Pain management: pain level controlled Vital Signs Assessment: post-procedure vital signs reviewed and stable Respiratory status: spontaneous breathing, nonlabored ventilation and respiratory function stable Cardiovascular status: blood pressure returned to baseline and stable Postop Assessment: no apparent nausea or vomiting Anesthetic complications: no   No notable events documented.  [place] Ouch

## 2021-11-24 ENCOUNTER — Other Ambulatory Visit: Payer: Self-pay

## 2021-11-24 ENCOUNTER — Encounter: Payer: Self-pay | Admitting: Ophthalmology

## 2021-11-25 ENCOUNTER — Encounter: Payer: Self-pay | Admitting: Emergency Medicine

## 2021-11-25 ENCOUNTER — Other Ambulatory Visit: Payer: Self-pay

## 2021-11-25 ENCOUNTER — Emergency Department: Payer: Medicare HMO

## 2021-11-25 ENCOUNTER — Emergency Department
Admission: EM | Admit: 2021-11-25 | Discharge: 2021-11-25 | Disposition: A | Discharge status: Home / Self Care | Payer: Medicare HMO | Attending: Student in an Organized Health Care Education/Training Program | Admitting: Student in an Organized Health Care Education/Training Program

## 2021-11-25 DIAGNOSIS — S3992XA Unspecified injury of lower back, initial encounter: Secondary | ICD-10-CM | POA: Diagnosis present

## 2021-11-25 DIAGNOSIS — S300XXA Contusion of lower back and pelvis, initial encounter: Secondary | ICD-10-CM | POA: Diagnosis not present

## 2021-11-25 DIAGNOSIS — S20221A Contusion of right back wall of thorax, initial encounter: Secondary | ICD-10-CM

## 2021-11-25 DIAGNOSIS — Y9301 Activity, walking, marching and hiking: Secondary | ICD-10-CM | POA: Insufficient documentation

## 2021-11-25 DIAGNOSIS — W228XXA Striking against or struck by other objects, initial encounter: Secondary | ICD-10-CM | POA: Diagnosis not present

## 2021-11-25 MED ORDER — CYCLOBENZAPRINE HCL 5 MG PO TABS: 5.0000 mg | ORAL_TABLET | Freq: Three times a day (TID) | ORAL | 0 refills | Status: AC | PRN

## 2021-11-25 MED ORDER — LIDOCAINE 5 % EX PTCH: 1.0000 | MEDICATED_PATCH | Freq: Two times a day (BID) | CUTANEOUS | 0 refills | Status: AC | PRN

## 2021-11-25 MED ORDER — CYCLOBENZAPRINE HCL 10 MG PO TABS
5.0000 mg | ORAL_TABLET | Freq: Once | ORAL | Status: AC
Start: 2021-11-25 — End: 2021-11-25
  Administered 2021-11-25: 5 mg via ORAL
  Filled 2021-11-25: qty 1

## 2021-11-25 MED ORDER — LIDOCAINE 5 % EX PTCH
1.0000 | MEDICATED_PATCH | Freq: Once | CUTANEOUS | Status: DC
  Administered 2021-11-25: 1 via TRANSDERMAL
  Filled 2021-11-25: qty 1

## 2021-11-25 NOTE — ED Triage Notes (Signed)
Pt's care discussed with [place] PA and [place], [place], V[facility] for DG Chest, no further orders given.

## 2021-11-25 NOTE — Discharge Instructions (Addendum)
Take over-the-counter Tylenol along with the prescription muscle relaxant and lidocaine patches as directed.  Follow-up with your primary provider for ongoing symptoms.

## 2021-11-25 NOTE — ED Notes (Signed)
E signature pad not working. Pt educated on discharge instructions and verbalized understanding.  

## 2021-11-25 NOTE — ED Provider Notes (Signed)
[facility] Emergency Department Provider Note     Event Date/Time   First MD Initiated Contact with Patient 11/25/21 2135     (approximate)   History   Back Pain   HPI  [name] [name] is a 66 y.o. female presents to the ED for evaluation of injury that occurred about a week earlier.  Patient was at her house, when she apparently walked into a doorknob, causing contact to the right side low back over the flank.  She denies any chest pain, vomiting, dizziness, or shortness of breath.  Patient also denies any dysuria or urinary symptoms.  She denies any significant pain initially, but noted onset of back pain yesterday after she bent over to put on her shoes in the car seat.  She denies any cough, hemoptysis, palpitations, or syncope.   Physical Exam   Triage Vital Signs: ED Triage Vitals  Enc Vitals Group     BP 11/25/21 2055 132/66     Pulse Rate 11/25/21 2055 64     Resp 11/25/21 2055 18     Temp 11/25/21 2055 98.3 F (36.8 C)     Temp Source 11/25/21 2055 Oral     SpO2 11/25/21 2055 97 %     Weight 11/25/21 2054 143 lb (64.9 kg)     Height 11/25/21 2054 5' (1.524 m)     Head Circumference --      Peak Flow --      Pain Score 11/25/21 2125 9     Pain Loc --      Pain Edu? --      Excl. in [place]? --     Most recent vital signs: Vitals:   11/25/21 2055  BP: 132/66  Pulse: 64  Resp: 18  Temp: 98.3 F (36.8 C)  SpO2: 97%    General Awake, no distress.  CV:  Good peripheral perfusion.  RESP:  Normal effort.  CTA.  Normal chest rise.  Mild tender palpation to the right posterior lateral rib border inferiorly. ABD:  No distention.  MSK:  Thoracic spine with some kyphosis and some dextroscoliosis appreciated.  No obvious bruise, abrasion, or flail chest appreciated.  ED Results / Procedures / Treatments   Labs (all labs ordered are listed, but only abnormal results are displayed) Labs Reviewed  URINALYSIS, ROUTINE W REFLEX MICROSCOPIC      EKG   RADIOLOGY  I personally viewed and evaluated these images as part of my medical decision making, as well as reviewing the written report by the radiologist.  ED Provider Interpretation: no acute findings}  DG Chest 2 View  Result Date: 11/25/2021 CLINICAL DATA:  Right-sided chest pain following blunt trauma, initial encounter EXAM: CHEST - 2 VIEW COMPARISON:  06/14/2015 FINDINGS: Cardiac shadow is within normal limits. The lungs are well aerated bilaterally. No focal infiltrate or pneumothorax is seen. Visualized bony structures are unremarkable. IMPRESSION: No acute abnormality noted. Electronically Signed   By: Inez Catalina M.D.   On: 11/25/2021 21:59     PROCEDURES:  Critical Care performed: No  Procedures   MEDICATIONS ORDERED IN ED: Medications  lidocaine (LIDODERM) 5 % 1 patch (1 patch Transdermal Patch Applied 11/25/21 2350)  cyclobenzaprine (FLEXERIL) tablet 5 mg (5 mg Oral Given 11/25/21 2349)     IMPRESSION / MDM / ASSESSMENT AND PLAN / ED COURSE  I reviewed the triage vital signs and the nursing notes.  Differential diagnosis includes, but is not limited to, rib contusion, rib fracture, myalgias, kidney contusion  Patient's presentation is most consistent with acute complicated illness / injury requiring diagnostic workup.  Patient's diagnosis is consistent with chest wall contusion.  Radiologic evidence of any acute fracture or dislocation, or intrathoracic process.  Patient will be discharged home with prescriptions for benzopyrene Lidoderm patches. Patient is to follow up with her PCP as needed or otherwise directed. Patient is given ED precautions to return to the ED for any worsening or new symptoms.     FINAL CLINICAL IMPRESSION(S) / ED DIAGNOSES   Final diagnoses:  Back contusion, right, initial encounter     Rx / DC Orders   ED Discharge Orders          Ordered    cyclobenzaprine (FLEXERIL) 5 MG tablet  3  times daily PRN        11/25/21 2315    lidocaine (LIDODERM) 5 %  Every 12 hours PRN        11/25/21 2315             Note:  This document was prepared using Dragon voice recognition software and may include unintentional dictation errors.    Melvenia Needles, PA-C 11/25/21 2355    Merlyn Lot, MD 11/25/21 [phone]

## 2021-11-25 NOTE — ED Triage Notes (Signed)
Pt to ED via POV, pt states 1 week ago painters were in her house and she hit her mid-thoracic back on a door knob. Pt states increasing pain to mid thoracic region. Pt denies fall at this time. Pt denies distal neurologic changes since the injury. Pt with no obvious injury to R mid thoracic area, when assessing area of injury, pt points to mid R side of her back, not her spinal column.    Pt states pain worse with breathing, and worse with palpation

## 2021-12-05 NOTE — Discharge Instructions (Signed)

## 2021-12-07 ENCOUNTER — Ambulatory Visit: Payer: Medicare HMO | Admitting: Anesthesiology

## 2021-12-07 ENCOUNTER — Ambulatory Visit (AMBULATORY_SURGERY_CENTER): Payer: Medicare HMO | Admitting: Anesthesiology

## 2021-12-07 ENCOUNTER — Encounter
Admission: RE | Disposition: A | Discharge status: Home / Self Care | Payer: Self-pay | Source: Home / Self Care | Attending: Ophthalmology

## 2021-12-07 ENCOUNTER — Ambulatory Visit
Admission: RE | Admit: 2021-12-07 | Discharge: 2021-12-07 | Disposition: A | Discharge status: Home / Self Care | Payer: Medicare HMO | Attending: Ophthalmology | Admitting: Ophthalmology

## 2021-12-07 ENCOUNTER — Other Ambulatory Visit: Payer: Self-pay

## 2021-12-07 ENCOUNTER — Encounter: Payer: Self-pay | Admitting: Ophthalmology

## 2021-12-07 DIAGNOSIS — D638 Anemia in other chronic diseases classified elsewhere: Secondary | ICD-10-CM | POA: Diagnosis not present

## 2021-12-07 DIAGNOSIS — I7300 Raynaud's syndrome without gangrene: Secondary | ICD-10-CM | POA: Diagnosis not present

## 2021-12-07 DIAGNOSIS — E785 Hyperlipidemia, unspecified: Secondary | ICD-10-CM | POA: Insufficient documentation

## 2021-12-07 DIAGNOSIS — E039 Hypothyroidism, unspecified: Secondary | ICD-10-CM

## 2021-12-07 DIAGNOSIS — H2511 Age-related nuclear cataract, right eye: Secondary | ICD-10-CM | POA: Insufficient documentation

## 2021-12-07 DIAGNOSIS — I4891 Unspecified atrial fibrillation: Secondary | ICD-10-CM

## 2021-12-07 DIAGNOSIS — I739 Peripheral vascular disease, unspecified: Secondary | ICD-10-CM | POA: Insufficient documentation

## 2021-12-07 DIAGNOSIS — Z8619 Personal history of other infectious and parasitic diseases: Secondary | ICD-10-CM | POA: Insufficient documentation

## 2021-12-07 DIAGNOSIS — I69351 Hemiplegia and hemiparesis following cerebral infarction affecting right dominant side: Secondary | ICD-10-CM | POA: Insufficient documentation

## 2021-12-07 DIAGNOSIS — M3500 Sicca syndrome, unspecified: Secondary | ICD-10-CM | POA: Insufficient documentation

## 2021-12-07 HISTORY — PX: CATARACT EXTRACTION W/PHACO: SHX586

## 2021-12-07 SURGERY — PHACOEMULSIFICATION, CATARACT, WITH IOL INSERTION
Anesthesia: Monitor Anesthesia Care | Site: Eye | Laterality: Right

## 2021-12-07 MED ORDER — SIGHTPATH DOSE#1 BSS IO SOLN
INTRAOCULAR | Status: DC | PRN
  Administered 2021-12-07: 15 mL

## 2021-12-07 MED ORDER — MOXIFLOXACIN HCL 0.5 % OP SOLN
OPHTHALMIC | Status: DC | PRN
  Administered 2021-12-07: 0.2 mL via OPHTHALMIC

## 2021-12-07 MED ORDER — TETRACAINE HCL 0.5 % OP SOLN
1.0000 [drp] | OPHTHALMIC | Status: DC | PRN
  Administered 2021-12-07 (×3): 1 [drp] via OPHTHALMIC

## 2021-12-07 MED ORDER — ARMC OPHTHALMIC DILATING DROPS
1.0000 | OPHTHALMIC | Status: DC | PRN
  Administered 2021-12-07 (×3): 1 via OPHTHALMIC

## 2021-12-07 MED ORDER — SIGHTPATH DOSE#1 BSS IO SOLN
INTRAOCULAR | Status: DC | PRN
  Administered 2021-12-07: 52 mL via OPHTHALMIC

## 2021-12-07 MED ORDER — FENTANYL CITRATE (PF) 100 MCG/2ML IJ SOLN
INTRAMUSCULAR | Status: DC | PRN
  Administered 2021-12-07: 50 ug via INTRAVENOUS

## 2021-12-07 MED ORDER — SIGHTPATH DOSE#1 NA HYALUR & NA CHOND-NA HYALUR IO KIT
PACK | INTRAOCULAR | Status: DC | PRN
  Administered 2021-12-07: 1 via OPHTHALMIC

## 2021-12-07 MED ORDER — LACTATED RINGERS IV SOLN: INTRAVENOUS | Status: DC

## 2021-12-07 MED ORDER — SIGHTPATH DOSE#1 BSS IO SOLN
INTRAOCULAR | Status: DC | PRN
  Administered 2021-12-07: 1 mL via INTRAMUSCULAR

## 2021-12-07 MED ORDER — BRIMONIDINE TARTRATE-TIMOLOL 0.2-0.5 % OP SOLN
OPHTHALMIC | Status: DC | PRN
  Administered 2021-12-07: 1 [drp] via OPHTHALMIC

## 2021-12-07 MED ORDER — MIDAZOLAM HCL 2 MG/2ML IJ SOLN
INTRAMUSCULAR | Status: DC | PRN
  Administered 2021-12-07: 1 mg via INTRAVENOUS

## 2021-12-07 SURGICAL SUPPLY — 11 items
CATARACT SUITE SIGHTPATH (MISCELLANEOUS) ×1 IMPLANT
FEE CATARACT SUITE SIGHTPATH (MISCELLANEOUS) ×1 IMPLANT
GLOVE SRG 8 PF TXTR STRL LF DI (GLOVE) ×1 IMPLANT
GLOVE SURG ENC TEXT LTX SZ7.5 (GLOVE) ×1 IMPLANT
GLOVE SURG UNDER POLY LF SZ8 (GLOVE) ×1
LENS IOL TECNIS EYHANCE 22.5 (Intraocular Lens) IMPLANT
NDL FILTER BLUNT 18X1 1/2 (NEEDLE) ×1 IMPLANT
NEEDLE FILTER BLUNT 18X 1/2SAF (NEEDLE) ×1
NEEDLE FILTER BLUNT 18X1 1/2 (NEEDLE) ×1 IMPLANT
SYR 3ML LL SCALE MARK (SYRINGE) ×1 IMPLANT
WATER STERILE IRR 250ML POUR (IV SOLUTION) ×1 IMPLANT

## 2021-12-07 NOTE — Anesthesia Postprocedure Evaluation (Signed)
Anesthesia Post Note  Patient: [name] [name]  Procedure(s) Performed: CATARACT EXTRACTION PHACO AND INTRAOCULAR LENS PLACEMENT (IOC) RIGHT 10.50 01:08.0 (Right: Eye)     Patient location during evaluation: PACU Anesthesia Type: MAC Level of consciousness: awake and alert Pain management: pain level controlled Vital Signs Assessment: post-procedure vital signs reviewed and stable Respiratory status: spontaneous breathing, nonlabored ventilation, respiratory function stable and patient connected to nasal cannula oxygen Cardiovascular status: stable and blood pressure returned to baseline Postop Assessment: no apparent nausea or vomiting Anesthetic complications: no   No notable events documented.  Martha Clan

## 2021-12-07 NOTE — Op Note (Signed)
LOCATION:  [place]   PREOPERATIVE DIAGNOSIS:    Nuclear sclerotic cataract right eye. H25.11   POSTOPERATIVE DIAGNOSIS:  Nuclear sclerotic cataract right eye.     PROCEDURE:  Phacoemusification with posterior chamber intraocular lens placement of the right eye   ULTRASOUND TIME: Procedure(s): CATARACT EXTRACTION PHACO AND INTRAOCULAR LENS PLACEMENT (IOC) RIGHT 10.50 01:08.0 (Right)  LENS:   Implant Name Type Inv. Item Serial No. Manufacturer Lot No. LRB No. Used Action  LENS IOL TECNIS EYHANCE 22.5 - [id number] Intraocular Lens LENS IOL TECNIS EYHANCE 22.5 [id number] [employer]  Right 1 Implanted         SURGEON:  Wyonia Hough, MD   ANESTHESIA:  Topical with tetracaine drops and 2% Xylocaine jelly, augmented with 1% preservative-free intracameral lidocaine.    COMPLICATIONS:  None.   DESCRIPTION OF PROCEDURE:  The patient was identified in the holding room and transported to the operating room and placed in the supine position under the operating microscope.  The right eye was identified as the operative eye and it was prepped and draped in the usual sterile ophthalmic fashion.   A 1 millimeter clear-corneal paracentesis was made at the 12:00 position.  0.5 ml of preservative-free 1% lidocaine was injected into the anterior chamber. The anterior chamber was filled with Viscoat viscoelastic.  A 2.4 millimeter keratome was used to make a near-clear corneal incision at the 9:00 position.  A curvilinear capsulorrhexis was made with a cystotome and capsulorrhexis forceps.  Balanced salt solution was used to hydrodissect and hydrodelineate the nucleus.   Phacoemulsification was then used in stop and chop fashion to remove the lens nucleus and epinucleus.  The remaining cortex was then removed using the irrigation and aspiration handpiece. Provisc was then placed into the capsular bag to distend it for lens placement.  A lens was then injected into the capsular bag.  The  remaining viscoelastic was aspirated.   Wounds were hydrated with balanced salt solution.  The anterior chamber was inflated to a physiologic pressure with balanced salt solution.  No wound leaks were noted. Vigamox 0.2 ml of a '1mg'$  per ml solution was injected into the anterior chamber for a dose of 0.2 mg of intracameral antibiotic at the completion of the case.   Timolol and Brimonidine drops were applied to the eye.  The patient was taken to the recovery room in stable condition without complications of anesthesia or surgery.   Kailin Leu 12/07/2021, 10:18 AM

## 2021-12-07 NOTE — Anesthesia Preprocedure Evaluation (Signed)
Anesthesia Evaluation  Patient identified by MRN, date of birth, ID band Patient awake    Reviewed: Allergy & Precautions, NPO status , Patient's Chart, lab work & pertinent test results  History of Anesthesia Complications Negative for: history of anesthetic complications  Airway Mallampati: III  TM Distance: >3 FB Neck ROM: full    Dental no notable dental hx.    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), Residual Symptoms negative psych ROS   GI/Hepatic negative GI ROS, Neg liver ROS,   Endo/Other  Hypothyroidism   Renal/GU      Musculoskeletal  (+) Arthritis ,   Abdominal Normal abdominal exam  (+)   Peds  Hematology negative hematology ROS (+)   Anesthesia Other Findings Past Medical History: No date: Adenopathy No date: Allergic state No date: Anemia No date: Complication of anesthesia     Comment:  slow to wake No date: Hyperlipidemia No date: Hypothyroidism No date: Osteopenia No date: Raynaud disease No date: Raynaud disease No date: Right sided weakness     Comment:  mild, since stroke (2017) No date: Scoliosis No date: Seizures ([place])     Comment:  during high school years No date: Shingles No date: Sjogren's disease ([place]) 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 20/2017: Stroke [facility])  Past Surgical History: 1990's: BREAST CYST ASPIRATION; Right     Comment:  neg 05/30/2017: COLONOSCOPY WITH PROPOFOL; N/A     Comment:  Procedure: COLONOSCOPY WITH PROPOFOL;  Surgeon: [place],               Benay Pike, MD;  Location: [facility] ENDOSCOPY;  Service:               Gastroenterology;  Laterality: N/A; No date: FOOT SURGERY; Right No date: TONSILLECTOMY 1992: vocal cord surgery     Comment:   nodules  BMI    Body Mass Index: 27.73 kg/m      Reproductive/Obstetrics negative OB ROS                             Anesthesia Physical  Anesthesia Plan  ASA: 3  Anesthesia Plan: MAC   Post-op Pain Management: Minimal or no pain anticipated   Induction: Intravenous  PONV Risk Score and Plan:   Airway Management Planned: Natural Airway and Nasal Cannula  Additional Equipment:   Intra-op Plan:   Post-operative Plan:   Informed Consent: I have reviewed the patients History and Physical, chart, labs and discussed the procedure including the risks, benefits and alternatives for the proposed anesthesia with the patient or authorized representative who has indicated his/her understanding and acceptance.     Dental advisory given  Plan Discussed with: Anesthesiologist, CRNA and Surgeon  Anesthesia Plan Comments:         Anesthesia Quick Evaluation

## 2021-12-07 NOTE — H&P (Signed)
[place]   Primary Care Physician:  Derinda Late, MD Ophthalmologist: Dr. Leandrew Koyanagi  Pre-Procedure History & Physical: HPI:  [name] [name] is a 66 y.o. female here for ophthalmic surgery.   Past Medical History:  Diagnosis Date   Adenopathy    Allergic state    Anemia    Complication of anesthesia    slow to wake   Hyperlipidemia    Hypothyroidism    Osteopenia    Raynaud disease    Raynaud disease    Right sided weakness    mild, since stroke (2017)   Scoliosis    Seizures ([place])    during high school years   Shingles    Sjogren's disease ([place])    Spleen anomaly    Stroke ([place]) 20/2017    Past Surgical History:  Procedure Laterality Date   BREAST CYST ASPIRATION Right 1990's   neg   CATARACT EXTRACTION W/PHACO Left 11/23/2021   Procedure: CATARACT EXTRACTION PHACO AND INTRAOCULAR LENS PLACEMENT ([place]) LEFT;  Surgeon: Leandrew Koyanagi, MD;  Location: [facility];  Service: Ophthalmology;  Laterality: Left;  4.72 0:53.0   COLONOSCOPY WITH PROPOFOL N/A 05/30/2017   Procedure: COLONOSCOPY WITH PROPOFOL;  Surgeon: Toledo, Benay Pike, MD;  Location: [facility] ENDOSCOPY;  Service: Gastroenterology;  Laterality: N/A;   FOOT SURGERY Right    TONSILLECTOMY     vocal cord surgery  1992   nodules    Prior to Admission medications   Medication Sig Start Date End Date Taking? Authorizing Provider  Ascorbic Acid (VITAMIN C) 100 MG tablet  05/29/19  Yes [provider]  atorvastatin (LIPITOR) 20 MG tablet Place 1 tablet (20 mg total) into feeding tube daily at 6 PM. 08/23/15  Yes Kirsteins, Luanna Salk, MD  CALCIUM PO Take by mouth daily.   Yes [provider]  cetirizine (ZYRTEC) 10 MG tablet Take 10 mg by mouth daily.   Yes [provider]  ELIQUIS 2.5 MG TABS tablet Take 2.5 mg by mouth 2 (two) times daily. 08/25/19  Yes [provider]  Ferrous Sulfate (SLOW FE PO) Take by mouth.   Yes [provider]   fluticasone (FLONASE) 50 MCG/ACT nasal spray Place into the nose.   Yes [provider]  hydroxychloroquine (PLAQUENIL) 200 MG tablet Take 200 mg by mouth daily.    Yes [provider]  ibandronate (BONIVA) 150 MG tablet Take 150 mg by mouth every 30 (thirty) days. 06/16/20  Yes [provider]  levothyroxine (SYNTHROID) 88 MCG tablet Take 88 mcg by mouth daily before breakfast.   Yes [provider]  Multiple Vitamins tablet  05/29/19  Yes [provider]  vitamin B-12 (CYANOCOBALAMIN) 1000 MCG tablet Take 1,000 mcg by mouth daily.   Yes [provider]  Vitamin D, Cholecalciferol, 10 MCG (400 UNIT) TABS Take 40 mcg by mouth daily. 05/29/19  Yes [provider]  Zinc Sulfate (ZINC 15 PO) Take by mouth daily.   Yes [provider]    Allergies as of 10/19/2021 - Review Complete 03/08/2021  Allergen Reaction Noted   Erythromycin ethylsuccinate Hives and Other (See Comments) 11/27/2014   Keflex [cephalexin] Hives and Other (See Comments) 11/27/2014   Latex  07/05/2015   Penicillins Other (See Comments) 11/27/2014   Sulfa antibiotics Other (See Comments) 11/27/2014    Family History  Problem Relation Age of Onset   Cancer Mother        pancreatic   Dementia Father    Diabetes  Sister        type 1   Diabetes Brother        type 1   Breast cancer Maternal Grandmother        breast    Social History   Socioeconomic History   Marital status: Married    Spouse name: Coralyn Mark   Number of children: 3   Years of education: 18   Highest education level: Not on file  Occupational History    Comment: Oncologist  Tobacco Use   Smoking status: Never   Smokeless tobacco: Never  Vaping Use   Vaping Use: Never used  Substance and Sexual Activity   Alcohol use: No   Drug use: No   Sexual activity: Not on file  Other Topics Concern   Not on file  Social History Narrative   Lives with husband in home    Caffeine use- occas ice tea   Social Determinants of Health   Financial Resource Strain: Not on file  Food Insecurity: Not on file  Transportation Needs: Not on file  Physical Activity: Not on file  Stress: Not on file  Social Connections: Not on file  Intimate Partner Violence: Not on file    Review of Systems: See HPI, otherwise negative ROS  Physical Exam: BP 122/73   Pulse 75   Temp (!) 96.8 F (36 C) (Temporal)   Ht 5' (1.524 m)   Wt 64.9 kg   LMP  (LMP Unknown)   SpO2 98%   BMI 27.93 kg/m  General:   Alert,  pleasant and cooperative in NAD Head:  Normocephalic and atraumatic. Lungs:  Clear to auscultation.    Heart:  Regular rate and rhythm.   Impression/Plan: [name] [name] is here for ophthalmic surgery.  Risks, benefits, limitations, and alternatives regarding ophthalmic surgery have been reviewed with the patient.  Questions have been answered.  All parties agreeable.   Leandrew Koyanagi, MD  12/07/2021, 9:05 AM

## 2021-12-07 NOTE — Transfer of Care (Signed)
Immediate Anesthesia Transfer of Care Note  Patient: [name] [name]  Procedure(s) Performed: CATARACT EXTRACTION PHACO AND INTRAOCULAR LENS PLACEMENT (IOC) RIGHT 10.50 01:08.0 (Right: Eye)  Patient Location: PACU  Anesthesia Type: MAC  Level of Consciousness: awake, alert  and patient cooperative  Airway and Oxygen Therapy: Patient Spontanous Breathing and Patient connected to supplemental oxygen  Post-op Assessment: Post-op Vital signs reviewed, Patient's Cardiovascular Status Stable, Respiratory Function Stable, Patent Airway and No signs of Nausea or vomiting  Post-op Vital Signs: Reviewed and stable  Complications: No notable events documented.

## 2021-12-08 ENCOUNTER — Encounter: Payer: Self-pay | Admitting: Ophthalmology

## 2022-02-13 ENCOUNTER — Encounter (INDEPENDENT_AMBULATORY_CARE_PROVIDER_SITE_OTHER): Payer: Self-pay

## 2022-03-13 ENCOUNTER — Encounter: Payer: Self-pay | Admitting: Dermatology

## 2022-03-13 ENCOUNTER — Ambulatory Visit (INDEPENDENT_AMBULATORY_CARE_PROVIDER_SITE_OTHER): Payer: Medicare HMO | Admitting: Dermatology

## 2022-03-13 DIAGNOSIS — L719 Rosacea, unspecified: Secondary | ICD-10-CM

## 2022-03-13 DIAGNOSIS — L720 Epidermal cyst: Secondary | ICD-10-CM

## 2022-03-13 DIAGNOSIS — Z1283 Encounter for screening for malignant neoplasm of skin: Secondary | ICD-10-CM

## 2022-03-13 DIAGNOSIS — L814 Other melanin hyperpigmentation: Secondary | ICD-10-CM

## 2022-03-13 DIAGNOSIS — L578 Other skin changes due to chronic exposure to nonionizing radiation: Secondary | ICD-10-CM

## 2022-03-13 DIAGNOSIS — I8393 Asymptomatic varicose veins of bilateral lower extremities: Secondary | ICD-10-CM

## 2022-03-13 DIAGNOSIS — D225 Melanocytic nevi of trunk: Secondary | ICD-10-CM

## 2022-03-13 DIAGNOSIS — L988 Other specified disorders of the skin and subcutaneous tissue: Secondary | ICD-10-CM

## 2022-03-13 DIAGNOSIS — L603 Nail dystrophy: Secondary | ICD-10-CM

## 2022-03-13 DIAGNOSIS — L821 Other seborrheic keratosis: Secondary | ICD-10-CM

## 2022-03-13 MED ORDER — TAVABOROLE 5 % EX SOLN: CUTANEOUS | 11 refills | Status: DC

## 2022-03-13 NOTE — Progress Notes (Signed)
Follow-Up Visit   Subjective  [name] [name] is a 66 y.o. female who presents for the following: Annual Exam (No personal hx of skin cancer or dysplastic nevi).  The patient presents for Total-Body Skin Exam (TBSE) for skin cancer screening and mole check.  The patient has spots, moles and lesions to be evaluated, some may be new or changing and the patient has concerns that these could be cancer. She still has problems with her fingernails lifting off and catches on clothing.   The following portions of the chart were reviewed this encounter and updated as appropriate:      Review of Systems: No other skin or systemic complaints except as noted in HPI or Assessment and Plan.   Objective  Well appearing patient in no apparent distress; mood and affect are within normal limits.  A full examination was performed including scalp, head, eyes, ears, nose, lips, neck, chest, axillae, abdomen, back, buttocks, bilateral upper extremities, bilateral lower extremities, hands, feet, fingers, toes, fingernails, and toenails. All findings within normal limits unless otherwise noted below.  Left Upper Vermilion Lip Smooth white papule(s).   Left Upper Vermilion Lip Tan macule.   cheeks, chin Erythema with telangiectasias   Right 2nd toe nail, L-3, L-4 fingernails Yellow white onycholysis with partially absent nail plate at L-3 fingernail, periungual involvement at L-4 fingernail Thickening at R 2nd toenail     Mid Root of Nose Rhytides and volume loss.    Assessment & Plan   Lentigines - Scattered tan macules - Due to sun exposure - Benign-appearing, observe - Recommend daily broad spectrum sunscreen SPF 30+ to sun-exposed areas, reapply every 2 hours as needed. - Call for any changes  Seborrheic Keratoses - Stuck-on, waxy, tan-brown papules and/or plaques  - Benign-appearing - Discussed benign etiology and prognosis. - Observe - Call for any changes  Melanocytic Nevi -  Tan-brown and/or pink-flesh-colored symmetric macules and papules, including upper abdomen - Benign appearing on exam today - Observation - Call clinic for new or changing moles - Recommend daily use of broad spectrum spf 30+ sunscreen to sun-exposed areas.   Hemangiomas - Red papules - Discussed benign nature - Observe - Call for any changes  Actinic Damage - Chronic condition, secondary to cumulative UV/sun exposure - diffuse scaly erythematous macules with underlying dyspigmentation - Recommend daily broad spectrum sunscreen SPF 30+ to sun-exposed areas, reapply every 2 hours as needed.  - Staying in the shade or wearing long sleeves, sun glasses (UVA+UVB protection) and wide brim hats (4-inch brim around the entire circumference of the hat) are also recommended for sun protection.  - Call for new or changing lesions.  Skin cancer screening performed today.  Varicose Veins/Spider Veins - Dilated blue, purple or red veins at the lower extremities - Reassured - Smaller vessels can be treated by sclerotherapy (a procedure to inject a medicine into the veins to make them disappear) if desired, but the treatment is not covered by insurance. Larger vessels may be covered if symptomatic and we would refer to vascular surgeon if treatment desired.   Milia Left Upper Vermilion Lip  Benign, observe.    Lentigo Left Upper Vermilion Lip  Benign-appearing.  Observation.  Call clinic for new or changing lesions.  Recommend daily use of broad spectrum spf 30+ sunscreen to sun-exposed areas.    Rosacea cheeks, chin  Chronic and persistent condition with duration or expected duration over one year. Not bothersome to patient.  Rosacea is a chronic progressive skin  condition usually affecting the face of adults, causing redness and/or acne bumps. It is treatable but not curable. It sometimes affects the eyes (ocular rosacea) as well. It may respond to topical and/or systemic medication and  can flare with stress, sun exposure, alcohol, exercise, topical steroids (including hydrocortisone/cortisone 10) and some foods.  Daily application of broad spectrum spf 30+ sunscreen to face is recommended to reduce flares.  Discussed the treatment option of BBL/laser.  Typically we recommend 1-3 treatment sessions about 5-8 weeks apart for best results.  The patient's condition may require "maintenance treatments" in the future.  The fee for BBL / laser treatments is $350 per treatment session for the whole face.  A fee can be quoted for other parts of the body. Insurance typically does not pay for BBL/laser treatments and therefore the fee is an out-of-pocket cost.   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 Condition is symptomatic/ bothersome to patient. Not currently at goal.   Start Kerydin solution at bedtime to affected nails.  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  I, Emelia Salisbury, CMA, am acting as scribe for Brendolyn Patty, MD.  Documentation: I have reviewed the above documentation for accuracy and completeness, and I agree with the above.  Brendolyn Patty MD

## 2022-03-13 NOTE — Patient Instructions (Addendum)
Recommend daily broad spectrum sunscreen SPF 30+ to sun-exposed areas, reapply every 2 hours as needed. Call for new or changing lesions.  Staying in the shade or wearing long sleeves, sun glasses (UVA+UVB protection) and wide brim hats (4-inch brim around the entire circumference of the hat) are also recommended for sun protection.     Basic OTC daily skin care regimen to prevent photoaging:   Recommend facial moisturizer with sunscreen SPF 30 every morning (brands include CeraVe AM, Neutrogena, Eucerin, Cetaphil, Aveeno, La Roche Posay).  Can also apply a topical Vit C serum which is an antioxidant (brands include CeraVe, La Roche Posay, and The Ordinary) underneath sunscreen in morning. If you are outside during the day in the summer for extended periods, especially swimming and/or sweating, make sure you apply a water resistant facial sunscreen lotion spf 30 or higher.   At night recommend a cream with retinol (a vitamin A derivative which stimulates collagen production) like CeraVe skin renewing retinol serum or ROC retinol correxion cream or Neutrogena rapid wrinkle repair cream. Retinol may cause skin irritation in people with sensitive skin.  Can use it every other day and/or apply on top of a hyaluronic acid (HA) moisturizer/serum if better tolerated that way.  Retinol may also help with lightening age spots.   Our office sells high quality, medically tested skin care lines such as Elta MD sunscreens (with Zinc), and Alastin skin care products, which are very effective in treating photoaging. The Alastin line includes cosmeceutical grade Vit.C serum, HA serum, Elastin stimulating moisturizers/serums, lightening serum, and sunscreens.  If you want prescription treatment, then you would need an appointment (Rx tretinoin and fade creams, Botox, filler injections, laser treatments, etc.) These prescriptions and procedures are not covered by insurance but work very well.     Melanoma  ABCDEs  Melanoma is the most dangerous type of skin cancer, and is the leading cause of death from skin disease.  You are more likely to develop melanoma if you: Have light-colored skin, light-colored eyes, or red or blond hair Spend a lot of time in the sun Tan regularly, either outdoors or in a tanning bed Have had blistering sunburns, especially during childhood Have a close family member who has had a melanoma Have atypical moles or large birthmarks  Early detection of melanoma is key since treatment is typically straightforward and cure rates are extremely high if we catch it early.   The first sign of melanoma is often a change in a mole or a new dark spot.  The ABCDE system is a way of remembering the signs of melanoma.  A for asymmetry:  The two halves do not match. B for border:  The edges of the growth are irregular. C for color:  A mixture of colors are present instead of an even brown color. D for diameter:  Melanomas are usually (but not always) greater than 62m - the size of a pencil eraser. E for evolution:  The spot keeps changing in size, shape, and color.  Please check your skin once per month between visits. You can use a small mirror in front and a large mirror behind you to keep an eye on the back side or your body.   If you see any new or changing lesions before your next follow-up, please call to schedule a visit.  Please continue daily skin protection including broad spectrum sunscreen SPF 30+ to sun-exposed areas, reapplying every 2 hours as needed when you're outdoors.   Staying  in the shade or wearing long sleeves, sun glasses (UVA+UVB protection) and wide brim hats (4-inch brim around the entire circumference of the hat) are also recommended for sun protection.    Due to recent changes in healthcare laws, you may see results of your pathology and/or laboratory studies on MyChart before the doctors have had a chance to review them. We understand that in some  cases there may be results that are confusing or concerning to you. Please understand that not all results are received at the same time and often the doctors may need to interpret multiple results in order to provide you with the best plan of care or course of treatment. Therefore, we ask that you please give [place] 2 business days to thoroughly review all your results before contacting the office for clarification. Should we see a critical lab result, you will be contacted sooner.   If You Need Anything After Your Visit  If you have any questions or concerns for your doctor, please call our main line at [phone] and press option 4 to reach your doctor's medical assistant. If no one answers, please leave a voicemail as directed and we will return your call as soon as possible. Messages left after 4 pm will be answered the following business day.   You may also send [place] a message via [place]. We typically respond to MyChart messages within 1-2 business days.  For prescription refills, please ask your pharmacy to contact our office. Our fax number is [phone].  If you have an urgent issue when the clinic is closed that cannot wait until the next business day, you can page your doctor at the number below.    Please note that while we do our best to be available for urgent issues outside of office hours, we are not available 24/7.   If you have an urgent issue and are unable to reach [place], you may choose to seek medical care at your doctor's office, retail clinic, urgent care center, or emergency room.  If you have a medical emergency, please immediately call 911 or go to the emergency department.  Pager Numbers  - Dr. Nehemiah Massed: [phone]  - Dr. Laurence Ferrari: [phone]  - Dr. Nicole Kindred: [phone]  In the event of inclement weather, please call our main line at [phone] for an update on the status of any delays or closures.  Dermatology Medication Tips: Please keep the boxes that  topical medications come in in order to help keep track of the instructions about where and how to use these. Pharmacies typically print the medication instructions only on the boxes and not directly on the medication tubes.   If your medication is too expensive, please contact our office at [phone] option 4 or send [place] a message through [place].   We are unable to tell what your co-pay for medications will be in advance as this is different depending on your insurance coverage. However, we may be able to find a substitute medication at lower cost or fill out paperwork to get insurance to cover a needed medication.   If a prior authorization is required to get your medication covered by your insurance company, please allow [place] 1-2 business days to complete this process.  Drug prices often vary depending on where the prescription is filled and some pharmacies may offer cheaper prices.  The website www.goodrx.com contains coupons for medications through different pharmacies. The prices here do not account for what the cost may be with help from insurance (  it may be cheaper with your insurance), but the website can give you the price if you did not use any insurance.  - You can print the associated coupon and take it with your prescription to the pharmacy.  - You may also stop by our office during regular business hours and pick up a GoodRx coupon card.  - If you need your prescription sent electronically to a different pharmacy, notify our office through [facility] or by phone at [phone] option 4.     Si Usted Necesita Algo Despus de Su Visita  Tambin puede enviarnos un mensaje a travs de Pharmacist, community. Por lo general respondemos a los mensajes de MyChart en el transcurso de 1 a 2 das hbiles.  Para renovar recetas, por favor pida a su farmacia que se ponga en contacto con nuestra oficina. Harland Dingwall de fax es [place] [phone].  Si tiene un asunto urgente cuando la clnica  est cerrada y que no puede esperar hasta el siguiente da hbil, puede llamar/localizar a su doctor(a) al nmero que aparece a continuacin.   Por favor, tenga en cuenta que aunque hacemos todo lo posible para estar disponibles para asuntos urgentes fuera del horario de [place], no estamos disponibles las 24 horas del da, los 7 das de la [place].   Si tiene un problema urgente y no puede comunicarse con nosotros, puede optar por buscar atencin mdica  en el consultorio de su doctor(a), en una clnica privada, en un centro de atencin urgente o en una sala de emergencias.  Si tiene Engineering geologist, por favor llame inmediatamente al 911 o vaya a la sala de emergencias.  Nmeros de bper  - Dr. Nehemiah Massed: [phone]  - Dra. Moye: [phone]  - Dra. Nicole Kindred: [phone]  En caso de inclemencias del [place], por favor llame a Johnsie Kindred principal al [phone] para una actualizacin sobre el [place] de cualquier retraso o cierre.  Consejos para la medicacin en dermatologa: Por favor, guarde las cajas en las que vienen los medicamentos de uso tpico para ayudarle a seguir las instrucciones sobre dnde y cmo usarlos. Las farmacias generalmente imprimen las instrucciones del medicamento slo en las cajas y no directamente en los tubos del [place].   Si su medicamento es muy caro, por favor, pngase en contacto con Zigmund Daniel llamando al [phone] y presione la opcin 4 o envenos un mensaje a travs de Pharmacist, community.   No podemos decirle cul ser su copago por los medicamentos por adelantado ya que esto es diferente dependiendo de la cobertura de su seguro. Sin embargo, es posible que podamos encontrar un medicamento sustituto a Electrical engineer un formulario para que el seguro cubra el medicamento que se considera necesario.   Si se requiere una autorizacin previa para que su compaa de seguros [place] su medicamento, por favor permtanos de 1 a 2 das hbiles para  completar este proceso.  Los precios de los medicamentos varan con frecuencia dependiendo del Environmental consultant de dnde se surte la receta y alguna farmacias pueden ofrecer precios ms baratos.  El sitio web www.goodrx.com tiene cupones para medicamentos de Airline pilot. Los precios aqu no tienen en cuenta lo que podra costar con la ayuda del seguro (puede ser ms barato con su seguro), pero el sitio web puede darle el precio si no utiliz Research scientist (physical sciences).  - Puede imprimir el cupn correspondiente y llevarlo con su receta a la farmacia.  - Tambin puede pasar por nuestra oficina durante el horario de atencin  regular y recoger una tarjeta de cupones de GoodRx.  - Si necesita que su receta se enve electrnicamente a una farmacia diferente, informe a nuestra oficina a travs de MyChart de [place] o por telfono llamando al [phone] y presione la opcin 4.

## 2022-05-30 ENCOUNTER — Other Ambulatory Visit: Payer: Self-pay

## 2022-05-30 DIAGNOSIS — Z1231 Encounter for screening mammogram for malignant neoplasm of breast: Secondary | ICD-10-CM

## 2022-08-11 ENCOUNTER — Ambulatory Visit
Admission: RE | Admit: 2022-08-11 | Discharge: 2022-08-11 | Disposition: A | Discharge status: Home / Self Care | Payer: Medicare HMO | Source: Ambulatory Visit | Attending: Obstetrics and Gynecology | Admitting: Obstetrics and Gynecology

## 2022-08-11 DIAGNOSIS — Z1231 Encounter for screening mammogram for malignant neoplasm of breast: Secondary | ICD-10-CM

## 2023-01-15 ENCOUNTER — Encounter: Payer: Self-pay | Admitting: Internal Medicine

## 2023-01-16 ENCOUNTER — Ambulatory Visit: Payer: Medicare HMO | Admitting: Anesthesiology

## 2023-01-16 ENCOUNTER — Encounter: Payer: Self-pay | Admitting: Internal Medicine

## 2023-01-16 ENCOUNTER — Encounter
Admission: RE | Disposition: A | Discharge status: Home / Self Care | Payer: Self-pay | Source: Home / Self Care | Attending: Internal Medicine

## 2023-01-16 ENCOUNTER — Ambulatory Visit
Admission: RE | Admit: 2023-01-16 | Discharge: 2023-01-16 | Disposition: A | Discharge status: Home / Self Care | Payer: Medicare HMO | Attending: Internal Medicine | Admitting: Internal Medicine

## 2023-01-16 DIAGNOSIS — Z1211 Encounter for screening for malignant neoplasm of colon: Secondary | ICD-10-CM | POA: Insufficient documentation

## 2023-01-16 DIAGNOSIS — E039 Hypothyroidism, unspecified: Secondary | ICD-10-CM | POA: Diagnosis not present

## 2023-01-16 DIAGNOSIS — M3500 Sicca syndrome, unspecified: Secondary | ICD-10-CM | POA: Insufficient documentation

## 2023-01-16 DIAGNOSIS — I7300 Raynaud's syndrome without gangrene: Secondary | ICD-10-CM | POA: Insufficient documentation

## 2023-01-16 DIAGNOSIS — I4891 Unspecified atrial fibrillation: Secondary | ICD-10-CM | POA: Diagnosis not present

## 2023-01-16 DIAGNOSIS — Z860101 Personal history of adenomatous and serrated colon polyps: Secondary | ICD-10-CM | POA: Diagnosis not present

## 2023-01-16 DIAGNOSIS — I69951 Hemiplegia and hemiparesis following unspecified cerebrovascular disease affecting right dominant side: Secondary | ICD-10-CM | POA: Insufficient documentation

## 2023-01-16 DIAGNOSIS — K640 First degree hemorrhoids: Secondary | ICD-10-CM | POA: Insufficient documentation

## 2023-01-16 HISTORY — PX: COLONOSCOPY WITH PROPOFOL: SHX5780

## 2023-01-16 SURGERY — COLONOSCOPY WITH PROPOFOL
Anesthesia: General

## 2023-01-16 MED ORDER — LIDOCAINE HCL (CARDIAC) PF 100 MG/5ML IV SOSY
PREFILLED_SYRINGE | INTRAVENOUS | Status: DC | PRN
  Administered 2023-01-16: 60 mg via INTRAVENOUS

## 2023-01-16 MED ORDER — SODIUM CHLORIDE 0.9 % IV SOLN: INTRAVENOUS | Status: DC

## 2023-01-16 MED ORDER — PROPOFOL 1000 MG/100ML IV EMUL
INTRAVENOUS | Status: AC
  Filled 2023-01-16: qty 100

## 2023-01-16 MED ORDER — PROPOFOL 10 MG/ML IV BOLUS
INTRAVENOUS | Status: AC
  Filled 2023-01-16: qty 40

## 2023-01-16 MED ORDER — PROPOFOL 10 MG/ML IV BOLUS
INTRAVENOUS | Status: DC | PRN
Start: 2023-01-16 — End: 2023-01-16
  Administered 2023-01-16: 40 mg via INTRAVENOUS
  Administered 2023-01-16: 30 mg via INTRAVENOUS
  Administered 2023-01-16: 100 ug/kg/min via INTRAVENOUS

## 2023-01-16 NOTE — Interval H&P Note (Signed)
History and Physical Interval Note:  01/16/2023 9:19 AM  [name] [name]  has presented today for surgery, with the diagnosis of V12.72 (ICD-9-CM) - Z86.010 (ICD-10-CM) - Hx of adenomatous colonic polyps.  The various methods of treatment have been discussed with the patient and family. After consideration of risks, benefits and other options for treatment, the patient has consented to  Procedure(s): COLONOSCOPY WITH PROPOFOL (N/A) as a surgical intervention.  The patient's history has been reviewed, patient examined, no change in status, stable for surgery.  I have reviewed the patient's chart and labs.  Questions were answered to the patient's satisfaction.     [place], [place]

## 2023-01-16 NOTE — Anesthesia Postprocedure Evaluation (Signed)
Anesthesia Post Note  Patient: [name] [name]  Procedure(s) Performed: COLONOSCOPY WITH PROPOFOL  Patient location during evaluation: Endoscopy Anesthesia Type: General Level of consciousness: awake and alert Pain management: pain level controlled Vital Signs Assessment: post-procedure vital signs reviewed and stable Respiratory status: spontaneous breathing, nonlabored ventilation and respiratory function stable Cardiovascular status: blood pressure returned to baseline and stable Postop Assessment: no apparent nausea or vomiting Anesthetic complications: no   No notable events documented.   Last Vitals:  Vitals:   01/16/23 0949 01/16/23 0959  BP:    Pulse: 72 66  Resp:    Temp:    SpO2: 100% 100%    Last Pain:  Vitals:   01/16/23 0959  TempSrc:   PainSc: 0-No pain                 Foye Deer

## 2023-01-16 NOTE — Op Note (Signed)
[facility] Gastroenterology Patient Name: [name] [name] Procedure Date: 01/16/2023 9:16 AM MRN: [id number] Account #: [id number] Date of Birth: 10-01-1955 Admit Type: Outpatient Age: 67 Room: [facility] ENDO ROOM 1 Gender: Female Note Status: Finalized Instrument Name: Prentice Docker [id number] Procedure:             Colonoscopy Indications:           High risk colon cancer surveillance: Personal history                         of non-advanced adenoma Providers:             Boykin Nearing. Norma Fredrickson MD, MD Referring MD:          Hassell Halim MD (Referring MD) Medicines:             Propofol per Anesthesia Complications:         No immediate complications. Procedure:             Pre-Anesthesia Assessment:                        - The risks and benefits of the procedure and the                         sedation options and risks were discussed with the                         patient. All questions were answered and informed                         consent was obtained.                        - Patient identification and proposed procedure were                         verified prior to the procedure by the nurse. The                         procedure was verified in the procedure room.                        - ASA Grade Assessment: III - A patient with severe                         systemic disease.                        - After reviewing the risks and benefits, the patient                         was deemed in satisfactory condition to undergo the                         procedure.                        After obtaining informed consent, the colonoscope was                         passed under  direct vision. Throughout the procedure,                         the patient's blood pressure, pulse, and oxygen                         saturations were monitored continuously. The                         Colonoscope was introduced through the anus and                         advanced  to the the cecum, identified by appendiceal                         orifice and ileocecal valve. The colonoscopy was                         performed without difficulty. The patient tolerated                         the procedure well. The quality of the bowel                         preparation was adequate. The ileocecal valve,                         appendiceal orifice, and rectum were photographed. Findings:      The perianal and digital rectal examinations were normal. Pertinent       negatives include normal sphincter tone and no palpable rectal lesions.      Non-bleeding internal hemorrhoids were found during retroflexion. The       hemorrhoids were Grade I (internal hemorrhoids that do not prolapse).      The exam was otherwise without abnormality. Impression:            - Non-bleeding internal hemorrhoids.                        - The examination was otherwise normal.                        - No specimens collected. Recommendation:        - Patient has a contact number available for                         emergencies. The signs and symptoms of potential                         delayed complications were discussed with the patient.                         Return to normal activities tomorrow. Written                         discharge instructions were provided to the patient.                        - Resume previous diet.                        -  Continue present medications.                        - Return to GI office PRN.                        - The findings and recommendations were discussed with                         the patient.                        - You do NOT require further colon cancer screening                         measures (Annual stool testing (i.e. hemoccult, FIT,                         cologuard), sigmoidoscopy, colonoscopy or CT                         colonography). You should share this recommendation                         with your Primary Care  provider. Procedure Code(s):     --- Professional ---                        [phone], Colonoscopy, flexible; diagnostic, including                         collection of specimen(s) by brushing or washing, when                         performed (separate procedure) Diagnosis Code(s):     --- Professional ---                        K64.0, First degree hemorrhoids                        Z86.010, Personal history of colonic polyps CPT copyright 2022 American Medical Association. All rights reserved. The codes documented in this report are preliminary and upon coder review may  be revised to meet current compliance requirements. Stanton Kidney MD, MD 01/16/2023 9:41:21 AM This report has been signed electronically. Number of Addenda: 0 Note Initiated On: 01/16/2023 9:16 AM Scope Withdrawal Time: 0 hours 5 minutes 52 seconds  Total Procedure Duration: 0 hours 9 minutes 20 seconds  Estimated Blood Loss:  Estimated blood loss: none.      [facility]

## 2023-01-16 NOTE — Transfer of Care (Signed)
Immediate Anesthesia Transfer of Care Note  Patient: [name] [name]  Procedure(s) Performed: COLONOSCOPY WITH PROPOFOL  Patient Location: PACU and Endoscopy Unit  Anesthesia Type:General  Level of Consciousness: sedated, drowsy, and patient cooperative  Airway & Oxygen Therapy: Patient Spontanous Breathing  Post-op Assessment: Report given to RN, Post -op Vital signs reviewed and stable, and Patient moving all extremities  Post vital signs: Reviewed and stable  Last Vitals:  Vitals Value Taken Time  BP 94/56 01/16/23 0940  Temp 36.1 C 01/16/23 0939  Pulse 83 01/16/23 0940  Resp 24 01/16/23 0940  SpO2 98 % 01/16/23 0940  Vitals shown include unfiled device data.  Last Pain:  Vitals:   01/16/23 0939  TempSrc: Temporal  PainSc: 0-No pain         Complications: No notable events documented.

## 2023-01-16 NOTE — Anesthesia Preprocedure Evaluation (Addendum)
Anesthesia Evaluation  Patient identified by MRN, date of birth, ID band Patient awake    Reviewed: Allergy & Precautions, NPO status , Patient's Chart, lab work & pertinent test results  History of Anesthesia Complications Negative for: history of anesthetic complications  Airway Mallampati: II  TM Distance: >3 FB Neck ROM: full    Dental no notable dental hx.    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), Residual Symptoms  negative psych ROS   GI/Hepatic negative GI ROS, Neg liver ROS,,,  Endo/Other  Hypothyroidism    Renal/GU      Musculoskeletal  (+) Arthritis ,    Abdominal Normal abdominal exam  (+)   Peds  Hematology negative hematology ROS (+)   Anesthesia Other Findings Past Medical History: No date: Adenopathy No date: Allergic state No date: Anemia No date: Complication of anesthesia     Comment:  slow to wake No date: Hyperlipidemia No date: Hypothyroidism No date: Osteopenia No date: Raynaud disease No date: Raynaud disease No date: Right sided weakness     Comment:  mild, since stroke (2017) No date: Scoliosis No date: Seizures (HCC)     Comment:  during high school years No date: Shingles No date: Sjogren's disease (HCC) 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 20/2017: Stroke [facility])  Past Surgical History: 1990's: BREAST CYST ASPIRATION; Right     Comment:  neg 05/30/2017: COLONOSCOPY WITH PROPOFOL; N/A     Comment:  Procedure: COLONOSCOPY WITH PROPOFOL;  Surgeon: [place],               Boykin Nearing, MD;  Location: [facility] ENDOSCOPY;  Service:               Gastroenterology;  Laterality: N/A; No date: FOOT SURGERY; Right No date: TONSILLECTOMY 1992: vocal cord surgery     Comment:   nodules  BMI    Body Mass Index: 27.73 kg/m      Reproductive/Obstetrics negative OB ROS                             Anesthesia Physical Anesthesia Plan  ASA: 3  Anesthesia Plan: General   Post-op Pain Management: Minimal or no pain anticipated   Induction: Intravenous  PONV Risk Score and Plan:   Airway Management Planned: Natural Airway and Nasal Cannula  Additional Equipment:   Intra-op Plan:   Post-operative Plan:   Informed Consent: I have reviewed the patients History and Physical, chart, labs and discussed the procedure including the risks, benefits and alternatives for the proposed anesthesia with the patient or authorized representative who has indicated his/her understanding and acceptance.     Dental advisory given  Plan Discussed with: Anesthesiologist, CRNA and Surgeon  Anesthesia Plan Comments:         Anesthesia Quick Evaluation

## 2023-01-16 NOTE — H&P (Signed)
Outpatient short stay form Pre-procedure 01/16/2023 9:19 AM Ozie Dimaria K. Norma Fredrickson, M.D.  Primary Physician: Kandyce Rud, M.D.  Reason for visit:  Personal history of adenomatous colon polyps  History of present illness:                           Patient presents for colonoscopy for a personal hx of colon polyps. The patient denies abdominal pain, abnormal weight loss or rectal bleeding.      Current Facility-Administered Medications:    0.9 %  sodium chloride infusion, , Intravenous, Continuous, Shevlin, Boykin Nearing, MD, Last Rate: 20 mL/hr at 01/16/23 0843, New Bag at 01/16/23 0843  Facility-Administered Medications Ordered in Other Encounters:    lidocaine (cardiac) 100 mg/34mL (XYLOCAINE) injection 2%, , Intravenous, Anesthesia Intra-op, Henrietta Hoover, CRNA, 60 mg at 01/16/23 0865  Medications Prior to Admission  Medication Sig Dispense Refill Last Dose   Ascorbic Acid (VITAMIN C) 100 MG tablet    Past Week   atorvastatin (LIPITOR) 20 MG tablet Place 1 tablet (20 mg total) into feeding tube daily at 6 PM. 30 tablet 1 Past Week   CALCIUM PO Take by mouth daily.   Past Week   cetirizine (ZYRTEC) 10 MG tablet Take 10 mg by mouth daily.   Past Week   ELIQUIS 2.5 MG TABS tablet Take 2.5 mg by mouth 2 (two) times daily.   01/12/2023   Ferrous Sulfate (SLOW FE PO) Take by mouth.   Past Week   fluticasone (FLONASE) 50 MCG/ACT nasal spray Place into the nose.   01/16/2023 at 0600   hydroxychloroquine (PLAQUENIL) 200 MG tablet Take 200 mg by mouth daily.    Past Week   ibandronate (BONIVA) 150 MG tablet Take 150 mg by mouth every 30 (thirty) days.   Past Week   levothyroxine (SYNTHROID) 88 MCG tablet Take 88 mcg by mouth daily before breakfast.   01/16/2023 at 0600   Multiple Vitamins tablet    Past Week   Tavaborole 5 % SOLN Apply to affected nails at bedtime 4 mL 11 Past Week   vitamin B-12 (CYANOCOBALAMIN) 1000 MCG tablet Take 1,000 mcg by mouth daily.   Past Week   Vitamin D, Cholecalciferol, 10  MCG (400 UNIT) TABS Take 40 mcg by mouth daily.   Past Week   Zinc Sulfate (ZINC 15 PO) Take by mouth daily.   Past Week     Allergies  Allergen Reactions   Erythromycin Ethylsuccinate Hives and Other (See Comments)   Keflex [Cephalexin] Hives and Other (See Comments)   Latex Rash    Gloves at Gynecologist office   Novocain [Procaine] Palpitations    Heart races   Penicillins Other (See Comments)   Sulfa Antibiotics Other (See Comments)     Past Medical History:  Diagnosis Date   Adenopathy    Allergic state    Anemia    Complication of anesthesia    slow to wake   Hyperlipidemia    Hypothyroidism    Osteopenia    Raynaud disease    Raynaud disease    Right sided weakness    mild, since stroke (2017)   Scoliosis    Seizures (HCC)    during high school years   Shingles    Sjogren's disease (HCC)    Spleen anomaly    Stroke (HCC) 20/2017    Review of systems:  Otherwise negative.    Physical Exam  Gen: Alert, oriented. Appears stated age.  HEENT: [place]/AT. PERRLA. Lungs: CTA, no wheezes. CV: RR nl S1, S2. Abd: soft, benign, no masses. BS+ Ext: No edema. Pulses 2+    Planned procedures: Proceed with colonoscopy. The patient understands the nature of the planned procedure, indications, risks, alternatives and potential complications including but not limited to bleeding, infection, perforation, damage to internal organs and possible oversedation/side effects from anesthesia. The patient agrees and gives consent to proceed.  Please refer to procedure notes for findings, recommendations and patient disposition/instructions.     Silas Sedam K. Norma Fredrickson, M.D. Gastroenterology 01/16/2023  9:19 AM

## 2023-01-17 ENCOUNTER — Encounter: Payer: Self-pay | Admitting: Internal Medicine

## 2023-03-12 ENCOUNTER — Ambulatory Visit: Payer: Medicare HMO | Admitting: Dermatology

## 2023-03-12 DIAGNOSIS — D225 Melanocytic nevi of trunk: Secondary | ICD-10-CM

## 2023-03-12 DIAGNOSIS — L720 Epidermal cyst: Secondary | ICD-10-CM

## 2023-03-12 DIAGNOSIS — L814 Other melanin hyperpigmentation: Secondary | ICD-10-CM

## 2023-03-12 DIAGNOSIS — L570 Actinic keratosis: Secondary | ICD-10-CM | POA: Diagnosis not present

## 2023-03-12 DIAGNOSIS — L603 Nail dystrophy: Secondary | ICD-10-CM

## 2023-03-12 DIAGNOSIS — L821 Other seborrheic keratosis: Secondary | ICD-10-CM

## 2023-03-12 DIAGNOSIS — W908XXA Exposure to other nonionizing radiation, initial encounter: Secondary | ICD-10-CM

## 2023-03-12 DIAGNOSIS — L719 Rosacea, unspecified: Secondary | ICD-10-CM | POA: Diagnosis not present

## 2023-03-12 DIAGNOSIS — I781 Nevus, non-neoplastic: Secondary | ICD-10-CM

## 2023-03-12 DIAGNOSIS — D1801 Hemangioma of skin and subcutaneous tissue: Secondary | ICD-10-CM

## 2023-03-12 DIAGNOSIS — Z1283 Encounter for screening for malignant neoplasm of skin: Secondary | ICD-10-CM | POA: Diagnosis not present

## 2023-03-12 DIAGNOSIS — D229 Melanocytic nevi, unspecified: Secondary | ICD-10-CM

## 2023-03-12 DIAGNOSIS — I8393 Asymptomatic varicose veins of bilateral lower extremities: Secondary | ICD-10-CM

## 2023-03-12 DIAGNOSIS — B351 Tinea unguium: Secondary | ICD-10-CM

## 2023-03-12 DIAGNOSIS — L578 Other skin changes due to chronic exposure to nonionizing radiation: Secondary | ICD-10-CM

## 2023-03-12 DIAGNOSIS — Z7189 Other specified counseling: Secondary | ICD-10-CM

## 2023-03-12 MED ORDER — GENTAMICIN SULFATE 0.3 % OP SOLN: OPHTHALMIC | 1 refills | Status: AC

## 2023-03-12 NOTE — Progress Notes (Signed)
Follow-Up Visit   Subjective  [name] [name] is a 67 y.o. female who presents for the following: Skin Cancer Screening and Full Body Skin Exam. No personal hx of skin cancer or dysplastic nevi.  The patient presents for Total-Body Skin Exam (TBSE) for skin cancer screening and mole check. The patient has spots, moles and lesions to be evaluated, some may be new or changing and the patient may have concern these could be cancer. She has a few spots she wants checked on the right calf, right thigh, left breast, and face.    The following portions of the chart were reviewed this encounter and updated as appropriate: medications, allergies, medical history  Review of Systems:  No other skin or systemic complaints except as noted in HPI or Assessment and Plan.  Objective  Well appearing patient in no apparent distress; mood and affect are within normal limits.  A full examination was performed including scalp, head, eyes, ears, nose, lips, neck, chest, axillae, abdomen, back, buttocks, bilateral upper extremities, bilateral lower extremities, hands, feet, fingers, toes, fingernails, and toenails. All findings within normal limits unless otherwise noted below.   Relevant physical exam findings are noted in the Assessment and Plan.  R cheek Pink scaly macule.     Assessment & Plan   SKIN CANCER SCREENING PERFORMED TODAY.  ACTINIC DAMAGE - Chronic condition, secondary to cumulative UV/sun exposure - diffuse scaly erythematous macules with underlying dyspigmentation - Recommend daily broad spectrum sunscreen SPF 30+ to sun-exposed areas, reapply every 2 hours as needed.  - Staying in the shade or wearing long sleeves, sun glasses (UVA+UVB protection) and wide brim hats (4-inch brim around the entire circumference of the hat) are also recommended for sun protection.  - Call for new or changing lesions.  LENTIGINES, HEMANGIOMAS - Benign normal skin lesions - Benign-appearing - Call for any  changes  SEBORRHEIC KERATOSIS - Stuck-on, waxy, tan-brown papules and/or plaques, including left breast, right thigh, right calf  - Benign-appearing - Discussed benign etiology and prognosis. - Observe - Call for any changes  MELANOCYTIC NEVI - Tan-brown and/or pink-flesh-colored symmetric macules and papules, including central upper abdomen - Benign appearing on exam today - Observation - Call clinic for new or changing moles - Recommend daily use of broad spectrum spf 30+ sunscreen to sun-exposed areas.   Milia - tiny firm white papules along upper vermillion edge - type of cyst - benign - sometimes these will clear with nightly OTC adapalene/Differin 0.1% gel or retinol. Sample of Effaclar given.  - may be extracted if symptomatic - observe  Varicose Veins/Spider Veins - Dilated blue, purple or red veins at the lower extremities - Reassured - Smaller vessels can be treated by sclerotherapy (a procedure to inject a medicine into the veins to make them disappear) if desired, but the treatment is not covered by insurance. Larger vessels may be covered if symptomatic and we would refer to vascular surgeon if treatment desired.   ROSACEA Exam Erythema with telangiectasias bl malar cheeks and nose.   Chronic and persistent condition with duration or expected duration over one year. Condition is symptomatic/ bothersome to patient. Not currently at goal.   Rosacea is a chronic progressive skin condition usually affecting the face of adults, causing redness and/or acne bumps. It is treatable but not curable. It sometimes affects the eyes (ocular rosacea) as well. It may respond to topical and/or systemic medication and can flare with stress, sun exposure, alcohol, exercise, topical steroids (including hydrocortisone/cortisone 10)  and some foods.  Daily application of broad spectrum spf 30+ sunscreen to face is recommended to reduce flares.  Treatment Plan: Recommend BBL to treat  telangiectasias  Counseling for BBL / IPL / Laser and Coordination of Care Discussed the treatment option of Broad Band Light (BBL) /Intense Pulsed Light (IPL)/ Laser for skin discoloration, including brown spots and redness.  Typically we recommend at least 1-3 treatment sessions about 5-8 weeks apart for best results.  Cannot have tanned skin when BBL performed, and regular use of sunscreen/photoprotection is advised after the procedure to help maintain results. The patient's condition may also require "maintenance treatments" in the future.  The fee for BBL / laser treatments is $350 per treatment session for the whole face.  A fee can be quoted for other parts of the body.  Insurance typically does not pay for BBL/laser treatments and therefore the fee is an out-of-pocket cost. Recommend prophylactic valtrex treatment. Once scheduled for procedure, will send Rx in prior to patient's appointment.    AK (actinic keratosis) R cheek  Patient defers treatment today due to upcoming holidays. Will treat on follow-up.  Actinic keratoses are precancerous spots that appear secondary to cumulative UV radiation exposure/sun exposure over time. They are chronic with expected duration over 1 year. A portion of actinic keratoses will progress to squamous cell carcinoma of the skin. It is not possible to reliably predict which spots will progress to skin cancer and so treatment is recommended to prevent development of skin cancer.  Recommend daily broad spectrum sunscreen SPF 30+ to sun-exposed areas, reapply every 2 hours as needed.  Recommend staying in the shade or wearing long sleeves, sun glasses (UVA+UVB protection) and wide brim hats (4-inch brim around the entire circumference of the hat). Call for new or changing lesions.  Onychodystrophy  Related Medications Tavaborole 5 % SOLN Apply to affected nails at bedtime   Nail Dystrophy- likely Candidal with pseudomonas L-3, L-4 fingernails       Exam: Green discoloration and onycholysis at lateral folds and thinning of nail plates - left 3rd and 4th fingernails. Improvement compared to baseline photo, but green discoloration is new.  Chronic and persistent condition with duration or expected duration over one year. Condition is improving with treatment but not currently at goal.   Treatment Plan:  Continue Kerydin solution at bedtime to affected nails.  Start Gentamicin solution twice daily to affected nail.    Return 2-3 months, for treat AK right cheek, recheck nail. 1 year TBSE.Wendee Beavers, CMA, am acting as scribe for Willeen Niece, MD .   Documentation: I have reviewed the above documentation for accuracy and completeness, and I agree with the above.  Willeen Niece, MD

## 2023-03-12 NOTE — Patient Instructions (Addendum)
Cryotherapy Aftercare  Wash gently with soap and water everyday.   Apply Vaseline and Band-Aid daily until healed.   Seborrheic Keratosis  What causes seborrheic keratoses? Seborrheic keratoses are harmless, common skin growths that first appear during adult life.  As time goes by, more growths appear.  Some people may develop a large number of them.  Seborrheic keratoses appear on both covered and uncovered body parts.  They are not caused by sunlight.  The tendency to develop seborrheic keratoses can be inherited.  They vary in color from skin-colored to gray, brown, or even black.  They can be either smooth or have a rough, warty surface.   Seborrheic keratoses are superficial and look as if they were stuck on the skin.  Under the microscope this type of keratosis looks like layers upon layers of skin.  That is why at times the top layer may seem to fall off, but the rest of the growth remains and re-grows.    Treatment Seborrheic keratoses do not need to be treated, but can easily be removed in the office.  Seborrheic keratoses often cause symptoms when they rub on clothing or jewelry.  Lesions can be in the way of shaving.  If they become inflamed, they can cause itching, soreness, or burning.  Removal of a seborrheic keratosis can be accomplished by freezing, burning, or surgery. If any spot bleeds, scabs, or grows rapidly, please return to have it checked, as these can be an indication of a skin cancer.  Due to recent changes in healthcare laws, you may see results of your pathology and/or laboratory studies on MyChart before the doctors have had a chance to review them. We understand that in some cases there may be results that are confusing or concerning to you. Please understand that not all results are received at the same time and often the doctors may need to interpret multiple results in order to provide you with the best plan of care or course of treatment. Therefore, we ask that you  please give [place] 2 business days to thoroughly review all your results before contacting the office for clarification. Should we see a critical lab result, you will be contacted sooner.   If You Need Anything After Your Visit  If you have any questions or concerns for your doctor, please call our main line at [phone] and press option 4 to reach your doctor's medical assistant. If no one answers, please leave a voicemail as directed and we will return your call as soon as possible. Messages left after 4 pm will be answered the following business day.   You may also send [place] a message via MyChart. We typically respond to MyChart messages within 1-2 business days.  For prescription refills, please ask your pharmacy to contact our office. Our fax number is [phone].  If you have an urgent issue when the clinic is closed that cannot wait until the next business day, you can page your doctor at the number below.    Please note that while we do our best to be available for urgent issues outside of office hours, we are not available 24/7.   If you have an urgent issue and are unable to reach [place], you may choose to seek medical care at your doctor's office, retail clinic, urgent care center, or emergency room.  If you have a medical emergency, please immediately call 911 or go to the emergency department.  Pager Numbers  - Dr. Gwen Pounds: [phone]  -  Dr. Roseanne Reno: [phone]  - Dr. Katrinka Blazing: [phone]   In the event of inclement weather, please call our main line at [phone] for an update on the status of any delays or closures.  Dermatology Medication Tips: Please keep the boxes that topical medications come in in order to help keep track of the instructions about where and how to use these. Pharmacies typically print the medication instructions only on the boxes and not directly on the medication tubes.   If your medication is too expensive, please contact our office at  [phone] option 4 or send [place] a message through MyChart.   We are unable to tell what your co-pay for medications will be in advance as this is different depending on your insurance coverage. However, we may be able to find a substitute medication at lower cost or fill out paperwork to get insurance to cover a needed medication.   If a prior authorization is required to get your medication covered by your insurance company, please allow [place] 1-2 business days to complete this process.  Drug prices often vary depending on where the prescription is filled and some pharmacies may offer cheaper prices.  The website www.goodrx.com contains coupons for medications through different pharmacies. The prices here do not account for what the cost may be with help from insurance (it may be cheaper with your insurance), but the website can give you the price if you did not use any insurance.  - You can print the associated coupon and take it with your prescription to the pharmacy.  - You may also stop by our office during regular business hours and pick up a GoodRx coupon card.  - If you need your prescription sent electronically to a different pharmacy, notify our office through [facility] or by phone at [phone] option 4.     Si Usted Necesita Algo Despus de Su Visita  Tambin puede enviarnos un mensaje a travs de Clinical cytogeneticist. Por lo general respondemos a los mensajes de MyChart en el transcurso de 1 a 2 das hbiles.  Para renovar recetas, por favor pida a su farmacia que se ponga en contacto con nuestra oficina. Annie Sable de fax es [place] [phone].  Si tiene un asunto urgente cuando la clnica est cerrada y que no puede esperar hasta el siguiente da hbil, puede llamar/localizar a su doctor(a) al nmero que aparece a continuacin.   Por favor, tenga en cuenta que aunque hacemos todo lo posible para estar disponibles para asuntos urgentes fuera del horario de [place], no estamos  disponibles las 24 horas del da, los 7 [address]  [address] de la [place].   Si tiene un problema urgente y no puede comunicarse con nosotros, puede optar por buscar atencin mdica  en el consultorio de su doctor(a), en una clnica privada, en un centro de atencin urgente o en una sala de emergencias.  Si tiene Engineer, drilling, por favor llame inmediatamente al 911 o vaya a la sala de emergencias.  Nmeros de bper  - Dr. Gwen Pounds: [phone]  - Dra. Roseanne Reno: [phone]  - Dr. Katrinka Blazing: [phone]   En caso de inclemencias del tiempo, por favor llame a Lacy Duverney principal al [phone] para una actualizacin sobre el [place] de cualquier retraso o cierre.  Consejos para la medicacin en dermatologa: Por favor, guarde las cajas en las que vienen los medicamentos de uso tpico para ayudarle a seguir las instrucciones sobre dnde y cmo usarlos. Las farmacias generalmente imprimen las instrucciones del medicamento slo en las  cajas y no directamente en los tubos del medicamento.   Si su medicamento es muy caro, por favor, pngase en contacto con Rolm Gala llamando al [phone] y presione la opcin 4 o envenos un mensaje a travs de Clinical cytogeneticist.   No podemos decirle cul ser su copago por los medicamentos por adelantado ya que esto es diferente dependiendo de la cobertura de su seguro. Sin embargo, es posible que podamos encontrar un medicamento sustituto a Audiological scientist un formulario para que el seguro cubra el medicamento que se considera necesario.   Si se requiere una autorizacin previa para que su compaa de seguros [place] su medicamento, por favor permtanos de 1 a 2 das hbiles para completar [address]  Los precios de los medicamentos varan con frecuencia dependiendo del Environmental consultant de dnde se surte la receta y alguna farmacias pueden ofrecer precios ms baratos.  El sitio web www.goodrx.com tiene cupones para medicamentos de Health and safety inspector. Los precios aqu no  tienen en cuenta lo que podra costar con la ayuda del seguro (puede ser ms barato con su seguro), pero el sitio web puede darle el precio si no utiliz Tourist information centre manager.  - Puede imprimir el cupn correspondiente y llevarlo con su receta a la farmacia.  - Tambin puede pasar por nuestra oficina durante el horario de atencin regular y Education officer, museum una tarjeta de cupones de GoodRx.  - Si necesita que su receta se enve electrnicamente a una farmacia diferente, informe a nuestra oficina a travs de MyChart de [place] o por telfono llamando al [phone] y presione la opcin 4.

## 2023-03-20 ENCOUNTER — Other Ambulatory Visit: Payer: Self-pay | Admitting: Dermatology

## 2023-03-20 DIAGNOSIS — L603 Nail dystrophy: Secondary | ICD-10-CM

## 2023-03-28 ENCOUNTER — Other Ambulatory Visit: Payer: Self-pay | Admitting: Medical Genetics

## 2023-03-29 ENCOUNTER — Other Ambulatory Visit
Admission: RE | Admit: 2023-03-29 | Discharge: 2023-03-29 | Disposition: A | Discharge status: Home / Self Care | Payer: Self-pay | Source: Ambulatory Visit | Attending: Medical Genetics | Admitting: Medical Genetics

## 2023-04-09 LAB — GENECONNECT MOLECULAR SCREEN: Genetic Analysis Overall Interpretation: NEGATIVE

## 2023-04-12 NOTE — Discharge Instructions (Signed)
[facility]  POST OPERATIVE INSTRUCTIONS FOR DR. Ether Griffins AND DR. Artemis Loyal [facility] CLINIC PODIATRY DEPARTMENT   Take your medication as prescribed.  Pain medication should be taken only as needed.  May use tylenol in addition as needed for max dose of 4000mg  a day.  If still having severe pain may take 1 pain med tablet every 4-6 hours as needed.  Keep the dressing clean, dry and intact.  Remain nonweightbearing at all times to the surgical extremity  Keep your foot elevated above the heart level for the first 48 hours.  Continue elevation thereafter to improve swelling  May apply ice to the top of the foot or back of knee for max 10 minutes out of every 1 hour as needed for pain/swelling control  Walking to the bathroom and brief periods of walking are acceptable, unless we have instructed you to be non-weight bearing.  Always wear your post-op shoe when walking.  Always use your crutches, knee scooter, or wheel chair if you are to be non-weight bearing.  Do not take a shower. Baths are permissible as long as the foot is kept out of the water.   Every hour you are awake:  Bend your knee 15 times. Massage calf 15 times  Call [facility] [phone]) if any of the following problems occur: You develop a temperature or fever. The bandage becomes saturated with blood. Medication does not stop your pain. Injury of the foot occurs. Any symptoms of infection including redness, odor, or red streaks running from wound.

## 2023-04-13 ENCOUNTER — Encounter
Admission: RE | Disposition: A | Discharge status: Home / Self Care | Payer: Self-pay | Source: Home / Self Care | Attending: Podiatry

## 2023-04-13 ENCOUNTER — Ambulatory Visit
Admission: RE | Admit: 2023-04-13 | Discharge: 2023-04-13 | Disposition: A | Discharge status: Home / Self Care | Payer: Medicare HMO | Attending: Podiatry | Admitting: Podiatry

## 2023-04-13 ENCOUNTER — Ambulatory Visit: Payer: Medicare HMO | Admitting: Certified Registered"

## 2023-04-13 ENCOUNTER — Encounter: Payer: Self-pay | Admitting: Podiatry

## 2023-04-13 ENCOUNTER — Other Ambulatory Visit: Payer: Self-pay

## 2023-04-13 ENCOUNTER — Ambulatory Visit: Payer: Medicare HMO

## 2023-04-13 DIAGNOSIS — S92352A Displaced fracture of fifth metatarsal bone, left foot, initial encounter for closed fracture: Secondary | ICD-10-CM | POA: Insufficient documentation

## 2023-04-13 DIAGNOSIS — I4892 Unspecified atrial flutter: Secondary | ICD-10-CM

## 2023-04-13 DIAGNOSIS — X58XXXA Exposure to other specified factors, initial encounter: Secondary | ICD-10-CM | POA: Insufficient documentation

## 2023-04-13 DIAGNOSIS — I4891 Unspecified atrial fibrillation: Secondary | ICD-10-CM | POA: Insufficient documentation

## 2023-04-13 DIAGNOSIS — I69951 Hemiplegia and hemiparesis following unspecified cerebrovascular disease affecting right dominant side: Secondary | ICD-10-CM | POA: Diagnosis not present

## 2023-04-13 DIAGNOSIS — I7300 Raynaud's syndrome without gangrene: Secondary | ICD-10-CM | POA: Insufficient documentation

## 2023-04-13 DIAGNOSIS — S92355A Nondisplaced fracture of fifth metatarsal bone, left foot, initial encounter for closed fracture: Secondary | ICD-10-CM

## 2023-04-13 DIAGNOSIS — E039 Hypothyroidism, unspecified: Secondary | ICD-10-CM | POA: Insufficient documentation

## 2023-04-13 DIAGNOSIS — M3500 Sicca syndrome, unspecified: Secondary | ICD-10-CM | POA: Diagnosis not present

## 2023-04-13 DIAGNOSIS — E782 Mixed hyperlipidemia: Secondary | ICD-10-CM

## 2023-04-13 HISTORY — PX: ORIF TOE FRACTURE: SHX5032

## 2023-04-13 SURGERY — OPEN REDUCTION INTERNAL FIXATION (ORIF) METATARSAL (TOE) FRACTURE
Anesthesia: General | Site: Foot | Laterality: Left

## 2023-04-13 MED ORDER — MIDAZOLAM HCL 2 MG/2ML IJ SOLN
INTRAMUSCULAR | Status: DC | PRN
  Administered 2023-04-13: 2 mg via INTRAVENOUS

## 2023-04-13 MED ORDER — OXYCODONE HCL 5 MG PO TABS
ORAL_TABLET | ORAL | Status: AC
  Filled 2023-04-13: qty 1

## 2023-04-13 MED ORDER — FENTANYL CITRATE (PF) 100 MCG/2ML IJ SOLN: 25.0000 ug | INTRAMUSCULAR | Status: DC | PRN

## 2023-04-13 MED ORDER — LIDOCAINE HCL (CARDIAC) PF 100 MG/5ML IV SOSY
PREFILLED_SYRINGE | INTRAVENOUS | Status: DC | PRN
  Administered 2023-04-13: 50 mg via INTRAVENOUS

## 2023-04-13 MED ORDER — VANCOMYCIN HCL IN DEXTROSE 1-5 GM/200ML-% IV SOLN
1000.0000 mg | INTRAVENOUS | Status: AC
  Administered 2023-04-13: 1000 mg via INTRAVENOUS

## 2023-04-13 MED ORDER — BUPIVACAINE HCL (PF) 0.25 % IJ SOLN
INTRAMUSCULAR | Status: AC
  Filled 2023-04-13: qty 30

## 2023-04-13 MED ORDER — PROPOFOL 10 MG/ML IV BOLUS
INTRAVENOUS | Status: AC
Start: 2023-04-13 — End: ?
  Filled 2023-04-13: qty 20

## 2023-04-13 MED ORDER — ACETAMINOPHEN 10 MG/ML IV SOLN: 1000.0000 mg | Freq: Once | INTRAVENOUS | Status: DC | PRN

## 2023-04-13 MED ORDER — ONDANSETRON HCL 4 MG/2ML IJ SOLN: 4.0000 mg | Freq: Once | INTRAMUSCULAR | Status: DC | PRN

## 2023-04-13 MED ORDER — FENTANYL CITRATE (PF) 100 MCG/2ML IJ SOLN
INTRAMUSCULAR | Status: AC
  Filled 2023-04-13: qty 2

## 2023-04-13 MED ORDER — ASPIRIN 81 MG PO TBEC: 81.0000 mg | DELAYED_RELEASE_TABLET | Freq: Two times a day (BID) | ORAL | 0 refills | Status: DC

## 2023-04-13 MED ORDER — BUPIVACAINE HCL (PF) 0.25 % IJ SOLN
INTRAMUSCULAR | Status: DC | PRN
  Administered 2023-04-13: 20 mL

## 2023-04-13 MED ORDER — 0.9 % SODIUM CHLORIDE (POUR BTL) OPTIME
TOPICAL | Status: DC | PRN
  Administered 2023-04-13: 500 mL

## 2023-04-13 MED ORDER — PROPOFOL 500 MG/50ML IV EMUL
INTRAVENOUS | Status: DC | PRN
  Administered 2023-04-13: 50 ug/kg/min via INTRAVENOUS
  Administered 2023-04-13: 125 ug/kg/min via INTRAVENOUS
  Administered 2023-04-13 (×2): 50 mg via INTRAVENOUS
  Administered 2023-04-13: 30 mg via INTRAVENOUS
  Administered 2023-04-13: 20 mg via INTRAVENOUS

## 2023-04-13 MED ORDER — DEXAMETHASONE SODIUM PHOSPHATE 10 MG/ML IJ SOLN
INTRAMUSCULAR | Status: DC | PRN
  Administered 2023-04-13: 5 mg via INTRAVENOUS

## 2023-04-13 MED ORDER — LIDOCAINE-EPINEPHRINE 1 %-1:100000 IJ SOLN
INTRAMUSCULAR | Status: AC
  Filled 2023-04-13: qty 1

## 2023-04-13 MED ORDER — LACTATED RINGERS IV SOLN: INTRAVENOUS | Status: DC

## 2023-04-13 MED ORDER — FENTANYL CITRATE (PF) 100 MCG/2ML IJ SOLN
INTRAMUSCULAR | Status: DC | PRN
  Administered 2023-04-13 (×4): 25 ug via INTRAVENOUS

## 2023-04-13 MED ORDER — HYDROCODONE-ACETAMINOPHEN 5-325 MG PO TABS: 1.0000 | ORAL_TABLET | Freq: Four times a day (QID) | ORAL | 0 refills | Status: AC | PRN

## 2023-04-13 MED ORDER — VANCOMYCIN HCL IN DEXTROSE 1-5 GM/200ML-% IV SOLN
INTRAVENOUS | Status: AC
  Filled 2023-04-13: qty 200

## 2023-04-13 MED ORDER — OXYCODONE HCL 5 MG PO TABS
5.0000 mg | ORAL_TABLET | Freq: Once | ORAL | Status: AC | PRN
  Administered 2023-04-13: 5 mg via ORAL

## 2023-04-13 MED ORDER — ONDANSETRON HCL 4 MG PO TABS: 4.0000 mg | ORAL_TABLET | Freq: Three times a day (TID) | ORAL | 0 refills | Status: DC | PRN

## 2023-04-13 MED ORDER — OXYCODONE HCL 5 MG/5ML PO SOLN: 5.0000 mg | Freq: Once | ORAL | Status: AC | PRN

## 2023-04-13 MED ORDER — PHENYLEPHRINE 80 MCG/ML (10ML) SYRINGE FOR IV PUSH (FOR BLOOD PRESSURE SUPPORT)
PREFILLED_SYRINGE | INTRAVENOUS | Status: DC | PRN
  Administered 2023-04-13 (×3): 160 ug via INTRAVENOUS
  Administered 2023-04-13: 80 ug via INTRAVENOUS
  Administered 2023-04-13: 160 ug via INTRAVENOUS
  Administered 2023-04-13: 80 ug via INTRAVENOUS

## 2023-04-13 MED ORDER — CHLORHEXIDINE GLUCONATE 0.12 % MT SOLN
OROMUCOSAL | Status: AC
  Filled 2023-04-13: qty 15

## 2023-04-13 MED ORDER — ORAL CARE MOUTH RINSE: 15.0000 mL | Freq: Once | OROMUCOSAL | Status: AC

## 2023-04-13 MED ORDER — CHLORHEXIDINE GLUCONATE 0.12 % MT SOLN
15.0000 mL | Freq: Once | OROMUCOSAL | Status: AC
  Administered 2023-04-13: 15 mL via OROMUCOSAL

## 2023-04-13 MED ORDER — CHLORHEXIDINE GLUCONATE 4 % EX SOLN: 60.0000 mL | Freq: Once | CUTANEOUS | Status: DC

## 2023-04-13 MED ORDER — PROPOFOL 1000 MG/100ML IV EMUL
INTRAVENOUS | Status: AC
  Filled 2023-04-13: qty 100

## 2023-04-13 MED ORDER — MIDAZOLAM HCL 2 MG/2ML IJ SOLN
INTRAMUSCULAR | Status: AC
  Filled 2023-04-13: qty 2

## 2023-04-13 MED ORDER — ELIQUIS 2.5 MG PO TABS: 2.5000 mg | ORAL_TABLET | Freq: Two times a day (BID) | ORAL | Status: AC

## 2023-04-13 SURGICAL SUPPLY — 33 items
BIT DRILL 3.2X150 CANNULATED (BIT) IMPLANT
BNDG ELASTIC 4X5.8 VLCR NS LF (GAUZE/BANDAGES/DRESSINGS) ×1 IMPLANT
BNDG ELASTIC 6X5.8 VLCR NS LF (GAUZE/BANDAGES/DRESSINGS) ×1 IMPLANT
BNDG ESMARCH 4X12 STRL LF (GAUZE/BANDAGES/DRESSINGS) ×1 IMPLANT
BNDG GAUZE DERMACEA FLUFF 4 (GAUZE/BANDAGES/DRESSINGS) ×1 IMPLANT
COVER LIGHT HANDLE STERIS (MISCELLANEOUS) ×2 IMPLANT
CUFF TOURN SGL QUICK 18X4 (TOURNIQUET CUFF) ×1 IMPLANT
DRAPE FLUOR MINI C-ARM 54X84 (DRAPES) ×1 IMPLANT
DRILL 3.2X150 CANNULATED (BIT) ×1
DURAPREP 26ML APPLICATOR (WOUND CARE) ×1 IMPLANT
ELECT REM PT RETURN 9FT ADLT (ELECTROSURGICAL) ×1
ELECTRODE REM PT RTRN 9FT ADLT (ELECTROSURGICAL) ×1 IMPLANT
GAUZE SPONGE 4X4 12PLY STRL (GAUZE/BANDAGES/DRESSINGS) ×1 IMPLANT
GAUZE XEROFORM 1X8 LF (GAUZE/BANDAGES/DRESSINGS) ×1 IMPLANT
GLOVE BIO SURGEON STRL SZ7 (GLOVE) ×1 IMPLANT
GLOVE SURG POLYISO LF SZ7 (GLOVE) ×1 IMPLANT
GOWN STRL REUS W/ TWL LRG LVL3 (GOWN DISPOSABLE) ×2 IMPLANT
KIT TURNOVER KIT A (KITS) ×1 IMPLANT
NS IRRIG 500ML POUR BTL (IV SOLUTION) ×1 IMPLANT
PACK EXTREMITY ARMC (MISCELLANEOUS) ×1 IMPLANT
PADDING CAST BLEND 4X4 NS (MISCELLANEOUS) ×3 IMPLANT
PENCIL SMOKE EVACUATOR (MISCELLANEOUS) ×1 IMPLANT
SCREW JONES CANN SHRT 4.5X40 (Screw) IMPLANT
SPLINT CAST 1 STEP 4X30 (MISCELLANEOUS) ×1 IMPLANT
SPLINT CAST 1 STEP 5X30 WHT (MISCELLANEOUS) ×1 IMPLANT
SUT ETHILON 4-0 FS2 18XMFL BLK (SUTURE) ×1
SUT MNCRL 4-0 27XMFL (SUTURE) ×1
SUT VIC AB 4-0 FS2 27 (SUTURE) ×1 IMPLANT
SUTURE ETHLN 4-0 FS2 18XMF BLK (SUTURE) ×1 IMPLANT
SUTURE MNCRL 4-0 27XMF (SUTURE) ×1 IMPLANT
TRAP FLUID SMOKE EVACUATOR (MISCELLANEOUS) ×1 IMPLANT
WATER STERILE IRR 500ML POUR (IV SOLUTION) ×1 IMPLANT
WIRE 1.5 NITINOL (MISCELLANEOUS) IMPLANT

## 2023-04-13 NOTE — H&P (Signed)
HISTORY AND PHYSICAL INTERVAL NOTE:  04/13/2023  11:55 AM  [name] [name]  has presented today for surgery, with the diagnosis of LEFT FIFTH METATARSAL FRACTURE.  The various methods of treatment have been discussed with the patient.  No guarantees were given.  After consideration of risks, benefits and other options for treatment, the patient has consented to surgery.  I have reviewed the patients' chart and labs.    CV: RRR, no obvious murmur RESP: CTA, nonlabored  PROCEDURE: LEFT 5TH METATARSAL FRACTURE ORIF   A history and physical examination was performed in my office.  The patient was reexamined.  There have been no changes to this history and physical examination.  Rosetta Posner, DPM

## 2023-04-13 NOTE — Op Note (Signed)
PODIATRY / FOOT AND ANKLE SURGERY OPERATIVE REPORT    SURGEON: Rosetta Posner, DPM  PRE-OPERATIVE DIAGNOSIS:  1.  Left fifth metatarsal Jones fracture  POST-OPERATIVE DIAGNOSIS: Same  PROCEDURE(S): Left fifth metatarsal fracture open reduction with internal fixation  HEMOSTASIS: Left ankle tourniquet  ANESTHESIA: MAC  ESTIMATED BLOOD LOSS: 2 cc  FINDING(S): 1.  Nondisplaced Jones fracture left fifth metatarsal base  PATHOLOGY/SPECIMEN(S): None  INDICATIONS:   [name] [name] is a 67 y.o. female who presents with a fracture to the left fifth metatarsal base.  She was seen by another provider and sent to me for evaluation of possible surgical intervention.  Patient previously had the same fracture on the right side and had ORIF performed and she did well with this and would like the same thing on the left side.  All treatment options were discussed with the patient both conservative and surgical attempts at correction include potential risks and complications at this time patient is elected for surgical procedure today consisting of left fifth metatarsal fracture open reduction with internal fixation.  No guarantees given.  Consent obtained prior to procedure, postoperative course discussed in great detail.  DESCRIPTION: After obtaining full informed written consent, the patient was brought back to the operating room and placed supine upon the operating table.  The patient received IV antibiotics prior to induction.  After obtaining adequate anesthesia, the patient was prepped and draped in the standard fashion.  20 cc of quarter percent Marcaine plain was injected about the left fifth ray and the sural nerve distribution as well as superficial peroneal nerve distribution.  An Esmarch bandage was used to exsanguinate the left lower extremity and the pneumatic ankle tourniquet was inflated.  Attention was then directed to the left fifth metatarsal base area.  At this time a wire for a 4.5  cannulated Paragon 28 screw was then placed under fluoroscopic guidance at the fifth metatarsal base level and under fluoroscopic guidance was directed through the fifth metatarsal base across the fracture and into the fifth metatarsal shaft and the medullary canal.  This was checked under fluoroscopic guidance in multiple planes including AP, lateral, and lateral oblique views.  The wire appeared to be in the appropriate position overall.  At this time utilizing standard AO principles and techniques a 4.5 mm x 40 mm cannulated partially-threaded headed screw from Paragon 28 was placed across the fracture site with excellent compression noted.  The wire was removed and passed off in the operative site.  Final C-arm imaging was then taken showing excellent compression across the Jones fracture with the screw in the appropriate orientation and multiple views/planes.  The surgical site was flushed with copious amounts normal sterile saline.  The skin was then reapproximated well coapted with 3-0 nylon in a combination of simple and horizontal mattress type stitching.  The pneumatic ankle tourniquet was deflated and a prompt hyperemic response was noted all digits left foot.  A postoperative dressing was then applied consisting of Xeroform, 4 x 4 gauze, gauze roll, posterior splint, Ace wrap.  The patient tolerated the procedure and anesthesia well and was transferred to recovery with vital signs stable vascular status appeared to be intact all toes left foot.  Following a period of postoperative monitoring the patient be discharged home with the appropriate orders, instructions, and medications.  Patient to follow-up in clinic within 1 week of surgical date for further evaluation.  COMPLICATIONS: None  CONDITION: Good stable  Rosetta Posner, DPM

## 2023-04-13 NOTE — Anesthesia Preprocedure Evaluation (Signed)
Anesthesia Evaluation  Patient identified by MRN, date of birth, ID band Patient awake    Reviewed: Allergy & Precautions, NPO status , Patient's Chart, lab work & pertinent test results  History of Anesthesia Complications Negative for: history of anesthetic complications  Airway Mallampati: II  TM Distance: >3 FB Neck ROM: full    Dental no notable dental hx. 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), Residual Symptoms  negative psych ROS   GI/Hepatic negative GI ROS, Neg liver ROS,,,  Endo/Other  Hypothyroidism    Renal/GU      Musculoskeletal  (+) Arthritis ,    Abdominal Normal abdominal exam  (+)   Peds  Hematology negative hematology ROS (+)   Anesthesia Other Findings Past Medical History: No date: Adenopathy No date: Allergic state No date: Anemia No date: Complication of anesthesia     Comment:  slow to wake No date: Hyperlipidemia No date: Hypothyroidism No date: Osteopenia No date: Raynaud disease No date: Raynaud disease No date: Right sided weakness     Comment:  mild, since stroke (2017) No date: Scoliosis No date: Seizures (HCC)     Comment:  during high school years No date: Shingles No date: Sjogren's disease (HCC) 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 20/2017: Stroke [facility])  Past Surgical History: 1990's: BREAST CYST ASPIRATION; Right     Comment:  neg 05/30/2017: COLONOSCOPY WITH PROPOFOL; N/A     Comment:  Procedure: COLONOSCOPY WITH PROPOFOL;  Surgeon: [place],               Boykin Nearing, MD;  Location: [facility] ENDOSCOPY;  Service:               Gastroenterology;  Laterality: N/A; No date: FOOT SURGERY;  Right No date: TONSILLECTOMY 1992: vocal cord surgery     Comment:  nodules  BMI    Body Mass Index: 27.73 kg/m      Reproductive/Obstetrics negative OB ROS                             Anesthesia Physical Anesthesia Plan  ASA: 3  Anesthesia Plan: General   Post-op Pain Management: Minimal or no pain anticipated   Induction: Intravenous  PONV Risk Score and Plan: 2 and Propofol infusion, TIVA, Ondansetron and Treatment may vary due to age or medical condition  Airway Management Planned: Nasal Cannula  Additional Equipment: None  Intra-op Plan:   Post-operative Plan:   Informed Consent: I have reviewed the patients History and Physical, chart, labs and discussed the procedure including the risks, benefits and alternatives for the proposed anesthesia with the patient or authorized representative who has indicated his/her understanding and acceptance.     Dental advisory given  Plan Discussed with: CRNA and Surgeon  Anesthesia Plan Comments: (Discussed risks of anesthesia with patient, including possibility of difficulty with spontaneous ventilation under anesthesia necessitating airway intervention, PONV, and rare risks such as cardiac or respiratory or neurological events, and allergic reactions. Discussed the role of CRNA in patient's perioperative care. Patient understands.)        Anesthesia Quick Evaluation

## 2023-04-13 NOTE — Anesthesia Procedure Notes (Signed)
Procedure Name: MAC Date/Time: 04/13/2023 12:05 PM  Performed by: Lily Lovings, CRNAPre-anesthesia Checklist: Patient identified, Emergency Drugs available, Suction available and Patient being monitored Patient Re-evaluated:Patient Re-evaluated prior to induction Oxygen Delivery Method: Simple face mask Preoxygenation: Pre-oxygenation with 100% oxygen Induction Type: IV induction

## 2023-04-13 NOTE — Anesthesia Postprocedure Evaluation (Signed)
Anesthesia Post Note  Patient: [name] [name]  Procedure(s) Performed: OPEN REDUCTION INTERNAL FIXATION (ORIF) METATARSAL (TOE) FRACTURE (Left: Foot)  Patient location during evaluation: PACU Anesthesia Type: General Level of consciousness: awake and alert Pain management: pain level controlled Vital Signs Assessment: post-procedure vital signs reviewed and stable Respiratory status: spontaneous breathing, nonlabored ventilation, respiratory function stable and patient connected to nasal cannula oxygen Cardiovascular status: blood pressure returned to baseline and stable Postop Assessment: no apparent nausea or vomiting Anesthetic complications: no   No notable events documented.   Last Vitals:  Vitals:   04/13/23 1306 04/13/23 1315  BP: 98/66 106/66  Pulse: 75 67  Resp: 19 15  Temp: 36.5 C   SpO2: 97% 98%    Last Pain:  Vitals:   04/13/23 1306  TempSrc:   PainSc: Asleep                 Corinda Gubler

## 2023-04-13 NOTE — Transfer of Care (Signed)
Immediate Anesthesia Transfer of Care Note  Patient: [name] [name]  Procedure(s) Performed: OPEN REDUCTION INTERNAL FIXATION (ORIF) METATARSAL (TOE) FRACTURE (Left: Toe)  Patient Location: PACU  Anesthesia Type:General  Level of Consciousness: awake and alert   Airway & Oxygen Therapy: Patient Spontanous Breathing  Post-op Assessment: Report given to RN and Post -op Vital signs reviewed and stable  Post vital signs: Reviewed and stable  Last Vitals:  Vitals Value Taken Time  BP 98/66 04/13/23 1306  Temp 36.5 04/13/23 1307  Pulse 75 04/13/23 1307  Resp 14 04/13/23 1307  SpO2 97 % 04/13/23 1307  Vitals shown include unfiled device data.  Last Pain:  Vitals:   04/13/23 1030  TempSrc: Oral  PainSc: 0-No pain         Complications: No notable events documented.

## 2023-04-16 ENCOUNTER — Encounter: Payer: Self-pay | Admitting: Podiatry

## 2023-04-25 ENCOUNTER — Ambulatory Visit: Payer: Medicare HMO | Admitting: Dermatology

## 2023-05-14 ENCOUNTER — Ambulatory Visit: Payer: Medicare HMO | Admitting: Dermatology

## 2023-05-30 ENCOUNTER — Ambulatory Visit (INDEPENDENT_AMBULATORY_CARE_PROVIDER_SITE_OTHER): Payer: Medicare Other | Admitting: Dermatology

## 2023-05-30 DIAGNOSIS — L309 Dermatitis, unspecified: Secondary | ICD-10-CM

## 2023-05-30 DIAGNOSIS — L570 Actinic keratosis: Secondary | ICD-10-CM | POA: Diagnosis not present

## 2023-05-30 DIAGNOSIS — I781 Nevus, non-neoplastic: Secondary | ICD-10-CM | POA: Diagnosis not present

## 2023-05-30 DIAGNOSIS — R238 Other skin changes: Secondary | ICD-10-CM | POA: Diagnosis not present

## 2023-05-30 DIAGNOSIS — W908XXA Exposure to other nonionizing radiation, initial encounter: Secondary | ICD-10-CM

## 2023-05-30 DIAGNOSIS — L719 Rosacea, unspecified: Secondary | ICD-10-CM

## 2023-05-30 DIAGNOSIS — R21 Rash and other nonspecific skin eruption: Secondary | ICD-10-CM

## 2023-05-30 DIAGNOSIS — L578 Other skin changes due to chronic exposure to nonionizing radiation: Secondary | ICD-10-CM

## 2023-05-30 MED ORDER — MOMETASONE FUROATE 0.1 % EX CREA
TOPICAL_CREAM | CUTANEOUS | 0 refills | Status: AC
Start: 2023-05-30 — End: ?

## 2023-05-30 NOTE — Progress Notes (Signed)
Follow-Up Visit   Subjective  [name] [name] is a 68 y.o. female who presents for the following: here for 2  to 3 month follow up on aks. Patient seen in November, noted ak at left cheek that did not treat. Here today to have checked.   Patient also reports a rash she developed in January at left arm. She states bumps were tingling at first but no long bother her. No pain or blisters. No itching. Reports seems to be fading but would like to have checked. Spoke to her PCP concerning spots but pcp did not feel areas where shingles.    The patient has spots, moles and lesions to be evaluated, some may be new or changing and the patient may have concern these could be cancer.   The following portions of the chart were reviewed this encounter and updated as appropriate: medications, allergies, medical history  Review of Systems:  No other skin or systemic complaints except as noted in HPI or Assessment and Plan.  Objective  Well appearing patient in no apparent distress; mood and affect are within normal limits.   A focused examination was performed of the following areas: face, left arm    Relevant exam findings are noted in the Assessment and Plan.  right medial cheek x 1 Erythematous thin papules/macules with gritty scale.   Assessment & Plan     ROSACEA with telangectiasia  Exam Mid face and nose erythema with telangiectasias  Chronic and persistent condition with duration or expected duration over one year. Condition is symptomatic/ bothersome to patient. Not currently at goal.   Rosacea is a chronic progressive skin condition usually affecting the face of adults, causing redness and/or acne bumps. It is treatable but not curable. It sometimes affects the eyes (ocular rosacea) as well. It may respond to topical and/or systemic medication and can flare with stress, sun exposure, alcohol, exercise, topical steroids (including hydrocortisone/cortisone 10) and some foods.  Daily  application of broad spectrum spf 30+ sunscreen to face is recommended to reduce flares.  Patient denies grittiness of the eyes  Treatment Plan Benign. Observe. Patient declines treatment at this time.     Dermatitis vrs resolving subclinical shingles  Exam: Clustered light pink papules at left posterior upper arm, improving per pt, no itching  Treatment Plan: Discussed biopsy, but not recommended at this time since it seems to be improving, unless rash worsens or doesn't clear  Start mometasone 0.1 cream - apply topically to arm bid prn for rash no more than 4 weeks  Topical steroids (such as triamcinolone, fluocinolone, fluocinonide, mometasone, clobetasol, halobetasol, betamethasone, hydrocortisone) can cause thinning and lightening of the skin if they are used for too long in the same area. Your physician has selected the right strength medicine for your problem and area affected on the body. Please use your medication only as directed by your physician to prevent side effects.     ACTINIC DAMAGE - chronic, secondary to cumulative UV radiation exposure/sun exposure over time - diffuse scaly erythematous macules with underlying dyspigmentation - Recommend daily broad spectrum sunscreen SPF 30+ to sun-exposed areas, reapply every 2 hours as needed.  - Recommend staying in the shade or wearing long sleeves, sun glasses (UVA+UVB protection) and wide brim hats (4-inch brim around the entire circumference of the hat). - Call for new or changing lesions.  DERMATITIS   Related Medications mometasone (ELOCON) 0.1 % cream Apply topically to affected areas at left arm twice daily for 2 weeks  for rash ACTINIC KERATOSIS right medial cheek x 1 Actinic keratoses are precancerous spots that appear secondary to cumulative UV radiation exposure/sun exposure over time. They are chronic with expected duration over 1 year. A portion of actinic keratoses will progress to squamous cell carcinoma  of the skin. It is not possible to reliably predict which spots will progress to skin cancer and so treatment is recommended to prevent development of skin cancer.  Recommend daily broad spectrum sunscreen SPF 30+ to sun-exposed areas, reapply every 2 hours as needed.  Recommend staying in the shade or wearing long sleeves, sun glasses (UVA+UVB protection) and wide brim hats (4-inch brim around the entire circumference of the hat). Call for new or changing lesions. Destruction of lesion - right medial cheek x 1  Destruction method: cryotherapy   Informed consent: discussed and consent obtained   Lesion destroyed using liquid nitrogen: Yes   Region frozen until ice ball extended beyond lesion: Yes   Outcome: patient tolerated procedure well with no complications   Post-procedure details: wound care instructions given   Additional details:  Prior to procedure, discussed risks of blister formation, small wound, skin dyspigmentation, or rare scar following cryotherapy. Recommend Vaseline ointment to treated areas while healing.   Return for keep follow up as scheduled 03/11/2024.  I, Asher Muir, CMA, am acting as scribe for Willeen Niece, MD.   Documentation: I have reviewed the above documentation for accuracy and completeness, and I agree with the above.  Willeen Niece, MD

## 2023-05-30 NOTE — Patient Instructions (Addendum)
For rash at left arm  Start mometasone 0.1 % cream - apply topically to rash at left arm twice daily for 2 weeks as needed.  Dont use cream more than 4 weeks  Topical steroids (such as triamcinolone, fluocinolone, fluocinonide, mometasone, clobetasol, halobetasol, betamethasone, hydrocortisone) can cause thinning and lightening of the skin if they are used for too long in the same area. Your physician has selected the right strength medicine for your problem and area affected on the body. Please use your medication only as directed by your physician to prevent side effects.   If rash doesn't resolved send mychart message or call      Actinic keratoses are precancerous spots that appear secondary to cumulative UV radiation exposure/sun exposure over time. They are chronic with expected duration over 1 year. A portion of actinic keratoses will progress to squamous cell carcinoma of the skin. It is not possible to reliably predict which spots will progress to skin cancer and so treatment is recommended to prevent development of skin cancer.  Recommend daily broad spectrum sunscreen SPF 30+ to sun-exposed areas, reapply every 2 hours as needed.  Recommend staying in the shade or wearing long sleeves, sun glasses (UVA+UVB protection) and wide brim hats (4-inch brim around the entire circumference of the hat). Call for new or changing lesions.    Cryotherapy Aftercare  Wash gently with soap and water everyday.   Apply Vaseline and Band-Aid daily until healed.    Due to recent changes in healthcare laws, you may see results of your pathology and/or laboratory studies on MyChart before the doctors have had a chance to review them. We understand that in some cases there may be results that are confusing or concerning to you. Please understand that not all results are received at the same time and often the doctors may need to interpret multiple results in order to provide you with the best plan of  care or course of treatment. Therefore, we ask that you please give [place] 2 business days to thoroughly review all your results before contacting the office for clarification. Should we see a critical lab result, you will be contacted sooner.   If You Need Anything After Your Visit  If you have any questions or concerns for your doctor, please call our main line at [phone] and press option 4 to reach your doctor's medical assistant. If no one answers, please leave a voicemail as directed and we will return your call as soon as possible. Messages left after 4 pm will be answered the following business day.   You may also send [place] a message via MyChart. We typically respond to MyChart messages within 1-2 business days.  For prescription refills, please ask your pharmacy to contact our office. Our fax number is [phone].  If you have an urgent issue when the clinic is closed that cannot wait until the next business day, you can page your doctor at the number below.    Please note that while we do our best to be available for urgent issues outside of office hours, we are not available 24/7.   If you have an urgent issue and are unable to reach [place], you may choose to seek medical care at your doctor's office, retail clinic, urgent care center, or emergency room.  If you have a medical emergency, please immediately call 911 or go to the emergency department.  Pager Numbers  - Dr. Gwen Pounds: [phone]  - Dr. Roseanne Reno: [phone]  - Dr. Katrinka Blazing:  [phone]   In the event of inclement weather, please call our main line at [phone] for an update on the status of any delays or closures.  Dermatology Medication Tips: Please keep the boxes that topical medications come in in order to help keep track of the instructions about where and how to use these. Pharmacies typically print the medication instructions only on the boxes and not directly on the medication tubes.   If your medication  is too expensive, please contact our office at [phone] option 4 or send [place] a message through MyChart.   We are unable to tell what your co-pay for medications will be in advance as this is different depending on your insurance coverage. However, we may be able to find a substitute medication at lower cost or fill out paperwork to get insurance to cover a needed medication.   If a prior authorization is required to get your medication covered by your insurance company, please allow [place] 1-2 business days to complete this process.  Drug prices often vary depending on where the prescription is filled and some pharmacies may offer cheaper prices.  The website www.goodrx.com contains coupons for medications through different pharmacies. The prices here do not account for what the cost may be with help from insurance (it may be cheaper with your insurance), but the website can give you the price if you did not use any insurance.  - You can print the associated coupon and take it with your prescription to the pharmacy.  - You may also stop by our office during regular business hours and pick up a GoodRx coupon card.  - If you need your prescription sent electronically to a different pharmacy, notify our office through [facility] or by phone at [phone] option 4.     Si Usted Necesita Algo Despus de Su Visita  Tambin puede enviarnos un mensaje a travs de Clinical cytogeneticist. Por lo general respondemos a los mensajes de MyChart en el transcurso de 1 a 2 das hbiles.  Para renovar recetas, por favor pida a su farmacia que se ponga en contacto con nuestra oficina. Annie Sable de fax es [place] [phone].  Si tiene un asunto urgente cuando la clnica est cerrada y que no puede esperar hasta el siguiente da hbil, puede llamar/localizar a su doctor(a) al nmero que aparece a continuacin.   Por favor, tenga en cuenta que aunque hacemos todo lo posible para estar disponibles para asuntos  urgentes fuera del horario de [place], no estamos disponibles las 24 horas del da, los 7 [address]  [address] de la [place].   Si tiene un problema urgente y no puede comunicarse con nosotros, puede optar por buscar atencin mdica  en el consultorio de su doctor(a), en una clnica privada, en un centro de atencin urgente o en una sala de emergencias.  Si tiene Engineer, drilling, por favor llame inmediatamente al 911 o vaya a la sala de emergencias.  Nmeros de bper  - Dr. Gwen Pounds: [phone]  - Dra. Roseanne Reno: [phone]  - Dr. Katrinka Blazing: [phone]   En caso de inclemencias del tiempo, por favor llame a Lacy Duverney principal al [phone] para una actualizacin sobre el [place] de cualquier retraso o cierre.  Consejos para la medicacin en dermatologa: Por favor, guarde las cajas en las que vienen los medicamentos de uso tpico para ayudarle a seguir las instrucciones sobre dnde y cmo usarlos. Las farmacias generalmente imprimen las instrucciones del medicamento slo en las cajas y no directamente en los tubos  del medicamento.   Si su medicamento es muy caro, por favor, pngase en contacto con Rolm Gala llamando al [phone] y presione la opcin 4 o envenos un mensaje a travs de Clinical cytogeneticist.   No podemos decirle cul ser su copago por los medicamentos por adelantado ya que esto es diferente dependiendo de la cobertura de su seguro. Sin embargo, es posible que podamos encontrar un medicamento sustituto a Audiological scientist un formulario para que el seguro cubra el medicamento que se considera necesario.   Si se requiere una autorizacin previa para que su compaa de seguros [place] su medicamento, por favor permtanos de 1 a 2 das hbiles para completar [address]  Los precios de los medicamentos varan con frecuencia dependiendo del Environmental consultant de dnde se surte la receta y alguna farmacias pueden ofrecer precios ms baratos.  El sitio web www.goodrx.com tiene cupones para  medicamentos de Health and safety inspector. Los precios aqu no tienen en cuenta lo que podra costar con la ayuda del seguro (puede ser ms barato con su seguro), pero el sitio web puede darle el precio si no utiliz Tourist information centre manager.  - Puede imprimir el cupn correspondiente y llevarlo con su receta a la farmacia.  - Tambin puede pasar por nuestra oficina durante el horario de atencin regular y Education officer, museum una tarjeta de cupones de GoodRx.  - Si necesita que su receta se enve electrnicamente a una farmacia diferente, informe a nuestra oficina a travs de MyChart de [place] o por telfono llamando al [phone] y presione la opcin 4.

## 2023-06-05 ENCOUNTER — Other Ambulatory Visit: Payer: Self-pay | Admitting: Obstetrics and Gynecology

## 2023-06-05 DIAGNOSIS — Z1231 Encounter for screening mammogram for malignant neoplasm of breast: Secondary | ICD-10-CM

## 2023-06-18 ENCOUNTER — Encounter: Payer: Self-pay | Admitting: Dermatology

## 2023-08-13 ENCOUNTER — Ambulatory Visit
Admission: RE | Admit: 2023-08-13 | Discharge: 2023-08-13 | Disposition: A | Discharge status: Home / Self Care | Payer: Medicare Other | Source: Ambulatory Visit | Attending: Obstetrics and Gynecology | Admitting: Obstetrics and Gynecology

## 2023-08-13 DIAGNOSIS — Z1231 Encounter for screening mammogram for malignant neoplasm of breast: Secondary | ICD-10-CM | POA: Diagnosis present

## 2024-03-11 ENCOUNTER — Ambulatory Visit: Payer: Medicare HMO | Admitting: Dermatology

## 2024-03-11 ENCOUNTER — Encounter: Payer: Self-pay | Admitting: Dermatology

## 2024-03-11 DIAGNOSIS — L814 Other melanin hyperpigmentation: Secondary | ICD-10-CM

## 2024-03-11 DIAGNOSIS — I8393 Asymptomatic varicose veins of bilateral lower extremities: Secondary | ICD-10-CM

## 2024-03-11 DIAGNOSIS — Z1283 Encounter for screening for malignant neoplasm of skin: Secondary | ICD-10-CM | POA: Diagnosis not present

## 2024-03-11 DIAGNOSIS — W908XXA Exposure to other nonionizing radiation, initial encounter: Secondary | ICD-10-CM

## 2024-03-11 DIAGNOSIS — L578 Other skin changes due to chronic exposure to nonionizing radiation: Secondary | ICD-10-CM

## 2024-03-11 DIAGNOSIS — D1801 Hemangioma of skin and subcutaneous tissue: Secondary | ICD-10-CM

## 2024-03-11 DIAGNOSIS — L603 Nail dystrophy: Secondary | ICD-10-CM

## 2024-03-11 DIAGNOSIS — L738 Other specified follicular disorders: Secondary | ICD-10-CM

## 2024-03-11 DIAGNOSIS — L719 Rosacea, unspecified: Secondary | ICD-10-CM

## 2024-03-11 DIAGNOSIS — Z872 Personal history of diseases of the skin and subcutaneous tissue: Secondary | ICD-10-CM

## 2024-03-11 DIAGNOSIS — K130 Diseases of lips: Secondary | ICD-10-CM

## 2024-03-11 DIAGNOSIS — Z7189 Other specified counseling: Secondary | ICD-10-CM

## 2024-03-11 DIAGNOSIS — D229 Melanocytic nevi, unspecified: Secondary | ICD-10-CM

## 2024-03-11 DIAGNOSIS — L821 Other seborrheic keratosis: Secondary | ICD-10-CM

## 2024-03-11 NOTE — Patient Instructions (Addendum)
 Seborrheic Keratosis  What causes seborrheic keratoses? Seborrheic keratoses are harmless, common skin growths that first appear during adult life.  As time goes by, more growths appear.  Some people may develop a large number of them.  Seborrheic keratoses appear on both covered and uncovered body parts.  They are not caused by sunlight.  The tendency to develop seborrheic keratoses can be inherited.  They vary in color from skin-colored to gray, brown, or even black.  They can be either smooth or have a rough, warty surface.   Seborrheic keratoses are superficial and look as if they were stuck on the skin.  Under the microscope this type of keratosis looks like layers upon layers of skin.  That is why at times the top layer may seem to fall off, but the rest of the growth remains and re-grows.    Treatment Seborrheic keratoses do not need to be treated, but can easily be removed in the office.  Seborrheic keratoses often cause symptoms when they rub on clothing or jewelry.  Lesions can be in the way of shaving.  If they become inflamed, they can cause itching, soreness, or burning.  Removal of a seborrheic keratosis can be accomplished by freezing, burning, or surgery. If any spot bleeds, scabs, or grows rapidly, please return to have it checked, as these can be an indication of a skin cancer.   Counseling for BBL / IPL / Laser and Coordination of Care Discussed the treatment option of Broad Band Light (BBL) /Intense Pulsed Light (IPL)/ Laser for skin discoloration, including brown spots and redness.  Typically we recommend at least 1-3 treatment sessions about 5-8 weeks apart for best results.  Cannot have tanned skin when BBL performed, and regular use of sunscreen/photoprotection is advised after the procedure to help maintain results. The patient's condition may also require maintenance treatments in the future.  The fee for BBL / laser treatments is $350 per treatment session for the whole face.   A fee can be quoted for other parts of the body.  Insurance typically does not pay for BBL/laser treatments and therefore the fee is an out-of-pocket cost. Recommend prophylactic valtrex treatment. Once scheduled for procedure, will send Rx in prior to patient's appointment.    Due to recent changes in healthcare laws, you may see results of your pathology and/or laboratory studies on MyChart before the doctors have had a chance to review them. We understand that in some cases there may be results that are confusing or concerning to you. Please understand that not all results are received at the same time and often the doctors may need to interpret multiple results in order to provide you with the best plan of care or course of treatment. Therefore, we ask that you please give us  2 business days to thoroughly review all your results before contacting the office for clarification. Should we see a critical lab result, you will be contacted sooner.   If You Need Anything After Your Visit  If you have any questions or concerns for your doctor, please call our main line at [phone] and press option 4 to reach your doctor's medical assistant. If no one answers, please leave a voicemail as directed and we will return your call as soon as possible. Messages left after 4 pm will be answered the following business day.   You may also send us  a message via MyChart. We typically respond to MyChart messages within 1-2 business days.  For prescription refills, please  ask your pharmacy to contact our office. Our fax number is [phone].  If you have an urgent issue when the clinic is closed that cannot wait until the next business day, you can page your doctor at the number below.    Please note that while we do our best to be available for urgent issues outside of office hours, we are not available 24/7.   If you have an urgent issue and are unable to reach us , you may choose to seek medical care at your  doctor's office, retail clinic, urgent care center, or emergency room.  If you have a medical emergency, please immediately call 911 or go to the emergency department.  Pager Numbers  - Dr. Hester: [phone]  - Dr. Jackquline: [phone]  - Dr. Claudene: [phone]   - Dr. Raymund: [phone]  In the event of inclement weather, please call our main line at [phone] for an update on the status of any delays or closures.  Dermatology Medication Tips: Please keep the boxes that topical medications come in in order to help keep track of the instructions about where and how to use these. Pharmacies typically print the medication instructions only on the boxes and not directly on the medication tubes.   If your medication is too expensive, please contact our office at [phone] option 4 or send us  a message through MyChart.   We are unable to tell what your co-pay for medications will be in advance as this is different depending on your insurance coverage. However, we may be able to find a substitute medication at lower cost or fill out paperwork to get insurance to cover a needed medication.   If a prior authorization is required to get your medication covered by your insurance company, please allow us  1-2 business days to complete this process.  Drug prices often vary depending on where the prescription is filled and some pharmacies may offer cheaper prices.  The website www.goodrx.com contains coupons for medications through different pharmacies. The prices here do not account for what the cost may be with help from insurance (it may be cheaper with your insurance), but the website can give you the price if you did not use any insurance.  - You can print the associated coupon and take it with your prescription to the pharmacy.  - You may also stop by our office during regular business hours and pick up a GoodRx coupon card.  - If you need your prescription sent electronically to  a different pharmacy, notify our office through [facility] or by phone at [phone] option 4.     Si Usted Necesita Algo Despus de Su Visita  Tambin puede enviarnos un mensaje a travs de Clinical Cytogeneticist. Por lo general respondemos a los mensajes de MyChart en el transcurso de 1 a 2 das hbiles.  Para renovar recetas, por favor pida a su farmacia que se ponga en contacto con nuestra oficina. Randi lakes de fax es [place] [phone].  Si tiene un asunto urgente cuando la clnica est cerrada y que no puede esperar hasta el siguiente da hbil, puede llamar/localizar a su doctor(a) al nmero que aparece a continuacin.   Por favor, tenga en cuenta que aunque hacemos todo lo posible para estar disponibles para asuntos urgentes fuera del horario de [place], no estamos disponibles las 24 horas del da, los 7 [address]  [address] de la [place].   Si tiene un problema urgente y no puede comunicarse con nosotros, puede optar por buscar atencin mdica  en el consultorio de su doctor(a), en una clnica privada, en un centro de atencin urgente o en una sala de emergencias.  Si tiene engineer, drilling, por favor llame inmediatamente al 911 o vaya a la sala de emergencias.  Nmeros de bper  - Dr. Hester: [phone]  - Dra. Jackquline: [phone]  - Dr. Claudene: [phone]  - Dra. Kitts: [phone]  En caso de inclemencias del [place], por favor llame a nuestra lnea principal al [phone] para una actualizacin sobre el estado de cualquier retraso o cierre.  Consejos para la medicacin en dermatologa: Por favor, guarde las cajas en las que vienen los medicamentos de uso tpico para ayudarle a seguir las instrucciones sobre dnde y cmo usarlos. Las farmacias generalmente imprimen las instrucciones del medicamento slo en las cajas y no directamente en los tubos del [place].   Si su medicamento es muy caro, por favor, pngase en contacto con landry rieger llamando al [phone] y  presione la opcin 4 o envenos un mensaje a travs de Clinical Cytogeneticist.   No podemos decirle cul ser su copago por los medicamentos por adelantado ya que esto es diferente dependiendo de la cobertura de su seguro. Sin embargo, es posible que podamos encontrar un medicamento sustituto a audiological scientist un formulario para que el seguro cubra el medicamento que se considera necesario.   Si se requiere una autorizacin previa para que su compaa de seguros cubra su medicamento, por favor permtanos de 1 a 2 das hbiles para completar este proceso.  Los precios de los medicamentos varan con frecuencia dependiendo del environmental consultant de dnde se surte la receta y alguna farmacias pueden ofrecer precios ms baratos.  El sitio web www.goodrx.com tiene cupones para medicamentos de health and safety inspector. Los precios aqu no tienen en cuenta lo que podra costar con la ayuda del seguro (puede ser ms barato con su seguro), pero el sitio web puede darle el precio si no utiliz tourist information centre manager.  - Puede imprimir el cupn correspondiente y llevarlo con su receta a la farmacia.  - Tambin puede pasar por nuestra oficina durante el horario de atencin regular y education officer, museum una tarjeta de cupones de GoodRx.  - Si necesita que su receta se enve electrnicamente a una farmacia diferente, informe a nuestra oficina a travs de MyChart de [place] o por telfono llamando al [phone] y presione la opcin 4.

## 2024-03-11 NOTE — Progress Notes (Signed)
 Follow-Up Visit   Subjective  [name] [name] is a 68 y.o. female who presents for the following: Skin Cancer Screening and Full Body Skin Exam. History of AK - right cheek.   The patient presents for Total-Body Skin Exam (TBSE) for skin cancer screening and mole check. The patient has spots, moles and lesions to be evaluated, some may be new or changing.  She has a growth in the right inguinal to check today, not bothersome.  Nail dystrophy to the left 3rd and 4th fingernails. Using Kerydin  solution and Gentamicin  solution. A little better but not clear.   The following portions of the chart were reviewed this encounter and updated as appropriate: medications, allergies, medical history  Review of Systems:  No other skin or systemic complaints except as noted in HPI or Assessment and Plan.  Objective  Well appearing patient in no apparent distress; mood and affect are within normal limits.  A full examination was performed including scalp, head, eyes, ears, nose, lips, neck, chest, axillae, abdomen, back, buttocks, bilateral upper extremities, bilateral lower extremities, hands, feet, fingers, toes, fingernails, and toenails. All findings within normal limits unless otherwise noted below.   Relevant physical exam findings are noted in the Assessment and Plan.       Assessment & Plan   SKIN CANCER SCREENING PERFORMED TODAY.  ACTINIC DAMAGE - Chronic condition, secondary to cumulative UV/sun exposure - diffuse scaly erythematous macules with underlying dyspigmentation - Recommend daily broad spectrum sunscreen SPF 30+ to sun-exposed areas, reapply every 2 hours as needed.  - Staying in the shade or wearing long sleeves, sun glasses (UVA+UVB protection) and wide brim hats (4-inch brim around the entire circumference of the hat) are also recommended for sun protection.  - Call for new or changing lesions.  LENTIGINES, SEBORRHEIC KERATOSES, HEMANGIOMAS - Benign normal skin lesions  -  SK R upper medial thigh 7 x 5 mm waxy brown stuck on papule. Discussed cryotherapy if becomes symptomatic. - Benign-appearing - Call for any changes   MELANOCYTIC NEVI - Tan-brown and/or pink-flesh-colored symmetric macules and papules - Benign appearing on exam today - Observation - Call clinic for new or changing moles - Recommend daily use of broad spectrum spf 30+ sunscreen to sun-exposed areas.   Nail Dystrophy   Exam: Nail dystrophy with lateral onycholysis white discoloration. New photo taken today.   Treatment:  Hold topical treatment until sample obtained Discussed fungal ID PCR test once nails are a little longer. Appt scheduled to get clipping.    Varicose Veins/Spider Veins - Dilated blue, purple or red veins at the lower extremities - Reassured - Smaller vessels can be treated by sclerotherapy (a procedure to inject a medicine into the veins to make them disappear) if desired, but the treatment is not covered by insurance. Larger vessels may be covered if symptomatic and we would refer to vascular surgeon if treatment desired.  ROSACEA Exam Mid face erythema with telangiectasias   Chronic and persistent condition with duration or expected duration over one year. Condition is symptomatic/ bothersome to patient. Not currently at goal.   Rosacea is a chronic progressive skin condition usually affecting the face of adults, causing redness and/or acne bumps. It is treatable but not curable. It sometimes affects the eyes (ocular rosacea) as well. It may respond to topical and/or systemic medication and can flare with stress, sun exposure, alcohol, exercise, topical steroids (including hydrocortisone /cortisone 10) and some foods.  Daily application of broad spectrum spf 30+ sunscreen to face  is recommended to reduce flares.  Patient denies grittiness of the eyes  Treatment Plan:  Counseling for BBL / IPL / Laser and Coordination of Care Discussed the treatment option of  Broad Band Light (BBL) /Intense Pulsed Light (IPL)/ Laser for skin discoloration, including brown spots and redness.  Typically we recommend at least 1-3 treatment sessions about 5-8 weeks apart for best results.  Cannot have tanned skin when BBL performed, and regular use of sunscreen/photoprotection is advised after the procedure to help maintain results. The patient's condition may also require maintenance treatments in the future.  The fee for BBL / laser treatments is $350 per treatment session for the whole face.  A fee can be quoted for other parts of the body.  Insurance typically does not pay for BBL/laser treatments and therefore the fee is an out-of-pocket cost. Recommend prophylactic valtrex treatment. Once scheduled for procedure, will send Rx in prior to patient's appointment.   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 Call for changes.  CHEILITIS From [place] Exam: Scaly erythematous patches at lips  Treatment Plan Samples given - Cetaphil Healing Ointment, CeraVe Ointment, Aquaphor, Vaseline.  Apply frequently throughout day  HISTORY OF PRECANCEROUS ACTINIC KERATOSIS - site(s) of PreCancerous Actinic Keratosis clear today. - these may recur and new lesions may form requiring treatment to prevent transformation into skin cancer - observe for new or changing spots and contact [facility] for appointment if occur - photoprotection with sun protective clothing; sunglasses and broad spectrum sunscreen with SPF of at least 30 + and frequent self skin exams recommended - yearly exams by a dermatologist recommended for persons with history of PreCancerous Actinic Keratoses   Return in about 1 year (around 03/11/2025) for TBSE, Hx AKs. Also 4-6 wks for PCR test (already sched).  IAndrea Kerns, CMA, am acting as scribe for Rexene Rattler, MD .   Documentation: I have reviewed the above documentation for accuracy  and completeness, and I agree with the above.  Rexene Rattler, MD

## 2024-04-15 ENCOUNTER — Ambulatory Visit: Admitting: Dermatology

## 2024-04-30 ENCOUNTER — Encounter: Payer: Self-pay | Admitting: Dermatology

## 2024-04-30 ENCOUNTER — Ambulatory Visit (INDEPENDENT_AMBULATORY_CARE_PROVIDER_SITE_OTHER): Admitting: Dermatology

## 2024-04-30 DIAGNOSIS — L603 Nail dystrophy: Secondary | ICD-10-CM

## 2024-04-30 NOTE — Patient Instructions (Signed)

## 2024-04-30 NOTE — Progress Notes (Signed)
" ° °  Follow-Up Visit   Subjective  [name] [name] is a 69 y.o. female who presents for the following: nail dystrophy, fingernails, pt presents for PCR Testing today, pt has used the Tavaborole  5% solution this week   The following portions of the chart were reviewed this encounter and updated as appropriate: medications, allergies, medical history  Review of Systems:  No other skin or systemic complaints except as noted in HPI or Assessment and Plan.  Objective  Well appearing patient in no apparent distress; mood and affect are within normal limits.   A focused examination was performed of the following areas: fingernails  Relevant exam findings are noted in the Assessment and Plan.    Assessment & Plan   NAIL DYSTROPHY Tried and failed Fluconazole  in past Tried and failed Tavaborole  5% sol (pt did use this a few times this week) L 3rd fingernail Exam: dystrophic nail, see previous photos  Chronic and persistent condition with duration or expected duration over one year. Condition is symptomatic/ bothersome to patient. Not currently at goal.   Treatment Plan: Nail-Fungal-ID Molecular Diagnostic test performed today.  Discussed with patient their insurance will be billed.  Advised the patient they may get a bill for a portion that's not covered by their insurance.  Should the patient have any issues with their remaining responsibility they will not be sent to collections but KRISTINE will work with them internally on any remaining balance.        Return for PRN pending nail ID results.  I, Grayce Saunas, RMA, am acting as scribe for Rexene Rattler, MD .   Documentation: I have reviewed the above documentation for accuracy and completeness, and I agree with the above.  Rexene Rattler, MD    "

## 2024-05-05 ENCOUNTER — Telehealth: Payer: Self-pay

## 2024-05-05 NOTE — Telephone Encounter (Signed)
 Please see molecular pathogen report scanned into media and advise. Thank you!

## 2024-05-07 MED ORDER — TERBINAFINE HCL 250 MG PO TABS: 250.0000 mg | ORAL_TABLET | Freq: Every day | ORAL | 1 refills | Status: AC

## 2024-05-07 NOTE — Addendum Note (Signed)
 Addended by: WILLMA KNEE A on: 05/07/2024 05:07 PM   Modules accepted: Orders

## 2024-05-07 NOTE — Telephone Encounter (Signed)
 Discussed results with patient, medication sent to pharmacy and appointment scheduled.

## 2024-07-21 ENCOUNTER — Ambulatory Visit: Admitting: Dermatology

## 2025-03-17 ENCOUNTER — Encounter: Admitting: Dermatology
